# Patient Record
Sex: Female | Born: 1955 | Race: White | Hispanic: No | Smoking: Current every day smoker
Health system: Southern US, Community
[De-identification: ages and names within clinical notes are randomized; demographics above are authoritative.]

## PROBLEM LIST (undated history)

## (undated) DIAGNOSIS — I10 Essential (primary) hypertension: Secondary | ICD-10-CM

## (undated) DIAGNOSIS — M539 Dorsopathy, unspecified: Secondary | ICD-10-CM

## (undated) DIAGNOSIS — F419 Anxiety disorder, unspecified: Secondary | ICD-10-CM

## (undated) DIAGNOSIS — Z8739 Personal history of other diseases of the musculoskeletal system and connective tissue: Secondary | ICD-10-CM

## (undated) DIAGNOSIS — M810 Age-related osteoporosis without current pathological fracture: Secondary | ICD-10-CM

## (undated) DIAGNOSIS — M1990 Unspecified osteoarthritis, unspecified site: Secondary | ICD-10-CM

## (undated) DIAGNOSIS — T8859XA Other complications of anesthesia, initial encounter: Secondary | ICD-10-CM

## (undated) DIAGNOSIS — M5126 Other intervertebral disc displacement, lumbar region: Secondary | ICD-10-CM

## (undated) DIAGNOSIS — E079 Disorder of thyroid, unspecified: Secondary | ICD-10-CM

## (undated) DIAGNOSIS — F32A Depression, unspecified: Secondary | ICD-10-CM

## (undated) DIAGNOSIS — E039 Hypothyroidism, unspecified: Secondary | ICD-10-CM

## (undated) DIAGNOSIS — M51369 Other intervertebral disc degeneration, lumbar region without mention of lumbar back pain or lower extremity pain: Secondary | ICD-10-CM

## (undated) DIAGNOSIS — M5136 Other intervertebral disc degeneration, lumbar region: Secondary | ICD-10-CM

## (undated) DIAGNOSIS — R0600 Dyspnea, unspecified: Secondary | ICD-10-CM

## (undated) DIAGNOSIS — Z923 Personal history of irradiation: Secondary | ICD-10-CM

## (undated) DIAGNOSIS — T4145XA Adverse effect of unspecified anesthetic, initial encounter: Secondary | ICD-10-CM

## (undated) DIAGNOSIS — C539 Malignant neoplasm of cervix uteri, unspecified: Secondary | ICD-10-CM

## (undated) DIAGNOSIS — F329 Major depressive disorder, single episode, unspecified: Secondary | ICD-10-CM

## (undated) HISTORY — DX: Personal history of irradiation: Z92.3

## (undated) HISTORY — PX: BACK SURGERY: SHX140

## (undated) HISTORY — PX: EYE SURGERY: SHX253

## (undated) HISTORY — PX: ABDOMINAL HYSTERECTOMY: SHX81

## (undated) HISTORY — DX: Malignant neoplasm of cervix uteri, unspecified: C53.9

## (undated) HISTORY — PX: TUBAL LIGATION: SHX77

---

## 2001-02-19 ENCOUNTER — Emergency Department (HOSPITAL_COMMUNITY): Admission: EM | Admit: 2001-02-19 | Discharge: 2001-02-19 | Payer: Self-pay | Admitting: Internal Medicine

## 2001-12-08 ENCOUNTER — Emergency Department (HOSPITAL_COMMUNITY): Admission: EM | Admit: 2001-12-08 | Discharge: 2001-12-08 | Payer: Self-pay | Admitting: *Deleted

## 2001-12-10 ENCOUNTER — Emergency Department (HOSPITAL_COMMUNITY): Admission: EM | Admit: 2001-12-10 | Discharge: 2001-12-10 | Payer: Self-pay | Admitting: *Deleted

## 2002-04-16 ENCOUNTER — Ambulatory Visit (HOSPITAL_COMMUNITY): Admission: RE | Admit: 2002-04-16 | Discharge: 2002-04-16 | Payer: Self-pay | Admitting: Family Medicine

## 2002-08-15 ENCOUNTER — Emergency Department (HOSPITAL_COMMUNITY): Admission: EM | Admit: 2002-08-15 | Discharge: 2002-08-16 | Payer: Self-pay | Admitting: Emergency Medicine

## 2003-02-19 ENCOUNTER — Emergency Department (HOSPITAL_COMMUNITY): Admission: EM | Admit: 2003-02-19 | Discharge: 2003-02-20 | Payer: Self-pay | Admitting: *Deleted

## 2003-02-21 ENCOUNTER — Emergency Department (HOSPITAL_COMMUNITY): Admission: EM | Admit: 2003-02-21 | Discharge: 2003-02-21 | Payer: Self-pay | Admitting: *Deleted

## 2003-06-05 ENCOUNTER — Ambulatory Visit (HOSPITAL_COMMUNITY): Admission: RE | Admit: 2003-06-05 | Discharge: 2003-06-05 | Payer: Self-pay | Admitting: Family Medicine

## 2004-12-11 ENCOUNTER — Ambulatory Visit (HOSPITAL_COMMUNITY): Admission: RE | Admit: 2004-12-11 | Discharge: 2004-12-11 | Payer: Self-pay | Admitting: Family Medicine

## 2005-04-27 ENCOUNTER — Ambulatory Visit: Payer: Self-pay | Admitting: Family Medicine

## 2005-04-30 ENCOUNTER — Ambulatory Visit (HOSPITAL_COMMUNITY): Admission: RE | Admit: 2005-04-30 | Discharge: 2005-04-30 | Payer: Self-pay | Admitting: Family Medicine

## 2006-11-06 ENCOUNTER — Emergency Department (HOSPITAL_COMMUNITY): Admission: EM | Admit: 2006-11-06 | Discharge: 2006-11-06 | Payer: Self-pay | Admitting: Emergency Medicine

## 2006-11-10 ENCOUNTER — Ambulatory Visit (HOSPITAL_BASED_OUTPATIENT_CLINIC_OR_DEPARTMENT_OTHER): Admission: RE | Admit: 2006-11-10 | Discharge: 2006-11-10 | Payer: Self-pay | Admitting: Otolaryngology

## 2009-10-03 ENCOUNTER — Ambulatory Visit (HOSPITAL_COMMUNITY): Admission: RE | Admit: 2009-10-03 | Discharge: 2009-10-03 | Payer: Self-pay | Admitting: Family Medicine

## 2010-05-03 ENCOUNTER — Encounter: Payer: Self-pay | Admitting: Family Medicine

## 2010-08-11 ENCOUNTER — Ambulatory Visit (HOSPITAL_COMMUNITY)
Admission: RE | Admit: 2010-08-11 | Discharge: 2010-08-11 | Disposition: A | Discharge status: Home / Self Care | Payer: BC Managed Care – PPO | Source: Ambulatory Visit | Attending: Family Medicine | Admitting: Family Medicine

## 2010-08-11 ENCOUNTER — Other Ambulatory Visit (HOSPITAL_COMMUNITY): Payer: Self-pay | Admitting: Family Medicine

## 2010-08-11 DIAGNOSIS — R0602 Shortness of breath: Secondary | ICD-10-CM | POA: Insufficient documentation

## 2010-08-11 DIAGNOSIS — S298XXA Other specified injuries of thorax, initial encounter: Secondary | ICD-10-CM

## 2010-08-11 DIAGNOSIS — J4489 Other specified chronic obstructive pulmonary disease: Secondary | ICD-10-CM | POA: Insufficient documentation

## 2010-08-11 DIAGNOSIS — R079 Chest pain, unspecified: Secondary | ICD-10-CM | POA: Insufficient documentation

## 2010-08-11 DIAGNOSIS — R05 Cough: Secondary | ICD-10-CM | POA: Insufficient documentation

## 2010-08-11 DIAGNOSIS — J449 Chronic obstructive pulmonary disease, unspecified: Secondary | ICD-10-CM | POA: Insufficient documentation

## 2010-08-11 DIAGNOSIS — R059 Cough, unspecified: Secondary | ICD-10-CM | POA: Insufficient documentation

## 2010-08-13 ENCOUNTER — Other Ambulatory Visit (HOSPITAL_COMMUNITY): Payer: Self-pay | Admitting: Family Medicine

## 2010-08-13 DIAGNOSIS — Z139 Encounter for screening, unspecified: Secondary | ICD-10-CM

## 2010-08-17 ENCOUNTER — Ambulatory Visit (HOSPITAL_COMMUNITY)
Admission: RE | Admit: 2010-08-17 | Discharge: 2010-08-17 | Disposition: A | Discharge status: Home / Self Care | Payer: BC Managed Care – PPO | Source: Ambulatory Visit | Attending: Family Medicine | Admitting: Family Medicine

## 2010-08-17 DIAGNOSIS — Z139 Encounter for screening, unspecified: Secondary | ICD-10-CM

## 2010-08-17 DIAGNOSIS — Z1231 Encounter for screening mammogram for malignant neoplasm of breast: Secondary | ICD-10-CM | POA: Insufficient documentation

## 2010-08-25 NOTE — Op Note (Signed)
[name], [name]              ACCOUNT NO.:  [id number]   MEDICAL RECORD NO.:  [id number]          PATIENT TYPE:  AMB   LOCATION:  DSC                          FACILITY:  [facility]   PHYSICIAN:  Suzanna Obey, M.D.       DATE OF BIRTH:  September 12, 1955   DATE OF PROCEDURE:  11/10/2006  DATE OF DISCHARGE:                               OPERATIVE REPORT   PREOPERATIVE DIAGNOSIS:  Right orbital floor blowout fracture.   POSTOPERATIVE DIAGNOSIS:  Right orbital floor blowout fracture.   SURGICAL PROCEDURE:  Open reduction, internal fixation of orbital floor  fracture.   SURGEON:  Suzanna Obey, M.D.   ANESTHESIA:  LMA.   ESTIMATED BLOOD LOSS:  Less than 5 mL.   INDICATIONS:  This is a 55 year old, who was involved in an injury that  ruptured the floor of her right orbital bone, that resulted in a fairly  significant defect.  She has no diplopia, but she does have a defect  that is potentially possible to cause an enophthalmus.  Given this, she  has no interest in having any risk of this occurring and wants to have  the area repaired.  She was informed of the risks and benefits, as well  as options.  The procedure was discussed.  All her questions were  answered and consent was obtained.   OPERATION:  The patient was taken to the operating room and placed in  supine position.  After adequate general LMA anesthesia, she was placed  in the supine position and prepped and draped in the usual sterile  manner.  The lateral canthus and infraorbital rim were injected with 1%  lidocaine with 1:100,000 epinephrine, approximately 1 mL.  An incision  was then made, after prepping and draping, lateral to the lateral  canthus.  A small incision was made.  Dissection was carried down to the  bone.  The lateral canthus was then divided, and the dissection was  carried down along the infraorbital rim, taking care to keep the  periorbita up and just cutting along the conjunctiva.  The orbital rim  was  exposed, and then an incision was made on the bone and the  periosteum was elevated into the orbital floor.  Immediately, the large  bony fragments and fracture were identified.  The nerve was identified  and preserved.  The fat was brought up out of the maxillary sinus.  The  maxillary sinus was suctioned out of all blood and clot debris.  A solid  portion of the bone was identified laterally and medially.  The  dissection was carried back far enough to reach the 1 edge of the  fracture.  With this up, the inion, a dissolvable floor prosthesis was  placed after performing a template and sizing it correctly.  This  positioned very nicely into the orbital floor and curved with the floor  shape.  This held the orbital contents up nicely.  She had a corneal  protection prosthesis placed over the cornea initially for protection of  the eye.  Once the inion was positioned correctly and hardened, a 4-0  chromic was used to oversewn the periosteum over the rim.  The eye was  then repositioned with the lateral canthus repositioned with a 5-  0 nylon.  It looked to be in perfect position.  The subcutaneous tissue  was then closed with interrupted 4-0 chromic, and an interrupted 5-0  nylon to close the lateral skin incision.  The eye was irrigated with  balanced salt solution.  The patient was then awakened and brought to  the recovery room in stable condition, and counts correct.           ______________________________  Suzanna Obey, M.D.     JB/MEDQ  D:  11/10/2006  T:  11/10/2006  Job:  [id number]

## 2011-01-25 LAB — POCT HEMOGLOBIN-HEMACUE
Hemoglobin: 15.1 — ABNORMAL HIGH
Operator id: 154361

## 2012-03-17 ENCOUNTER — Other Ambulatory Visit (HOSPITAL_COMMUNITY)
Admission: RE | Admit: 2012-03-17 | Discharge: 2012-03-17 | Disposition: A | Discharge status: Home / Self Care | Payer: BC Managed Care – PPO | Source: Ambulatory Visit | Attending: Obstetrics and Gynecology | Admitting: Obstetrics and Gynecology

## 2012-03-17 DIAGNOSIS — Z01419 Encounter for gynecological examination (general) (routine) without abnormal findings: Secondary | ICD-10-CM | POA: Insufficient documentation

## 2012-03-17 DIAGNOSIS — Z1151 Encounter for screening for human papillomavirus (HPV): Secondary | ICD-10-CM | POA: Insufficient documentation

## 2013-10-03 ENCOUNTER — Other Ambulatory Visit (HOSPITAL_COMMUNITY): Payer: Self-pay | Admitting: Family Medicine

## 2013-10-03 ENCOUNTER — Ambulatory Visit (HOSPITAL_COMMUNITY)
Admission: RE | Admit: 2013-10-03 | Discharge: 2013-10-03 | Disposition: A | Discharge status: Home / Self Care | Payer: BC Managed Care – PPO | Source: Ambulatory Visit | Attending: Family Medicine | Admitting: Family Medicine

## 2013-10-03 DIAGNOSIS — R10813 Right lower quadrant abdominal tenderness: Secondary | ICD-10-CM

## 2013-10-03 DIAGNOSIS — N200 Calculus of kidney: Secondary | ICD-10-CM | POA: Insufficient documentation

## 2013-10-03 MED ORDER — IOHEXOL 300 MG/ML  SOLN
100.0000 mL | Freq: Once | INTRAMUSCULAR | Status: AC | PRN
  Administered 2013-10-03: 100 mL via INTRAVENOUS

## 2013-10-04 ENCOUNTER — Other Ambulatory Visit (HOSPITAL_COMMUNITY): Payer: BC Managed Care – PPO

## 2014-04-24 ENCOUNTER — Other Ambulatory Visit (HOSPITAL_COMMUNITY): Payer: Self-pay | Admitting: Family Medicine

## 2014-04-24 ENCOUNTER — Ambulatory Visit (HOSPITAL_COMMUNITY)
Admission: RE | Admit: 2014-04-24 | Discharge: 2014-04-24 | Disposition: A | Discharge status: Home / Self Care | Payer: 59 | Source: Ambulatory Visit | Attending: Family Medicine | Admitting: Family Medicine

## 2014-04-24 DIAGNOSIS — J069 Acute upper respiratory infection, unspecified: Secondary | ICD-10-CM

## 2014-04-24 DIAGNOSIS — R05 Cough: Secondary | ICD-10-CM | POA: Insufficient documentation

## 2014-04-24 DIAGNOSIS — Z87891 Personal history of nicotine dependence: Secondary | ICD-10-CM | POA: Diagnosis not present

## 2014-04-24 DIAGNOSIS — R0789 Other chest pain: Secondary | ICD-10-CM | POA: Insufficient documentation

## 2014-04-24 DIAGNOSIS — R0989 Other specified symptoms and signs involving the circulatory and respiratory systems: Secondary | ICD-10-CM | POA: Diagnosis not present

## 2014-06-03 ENCOUNTER — Other Ambulatory Visit: Payer: Self-pay | Admitting: Preventative Medicine

## 2014-06-03 DIAGNOSIS — M545 Low back pain, unspecified: Secondary | ICD-10-CM

## 2014-06-16 ENCOUNTER — Ambulatory Visit
Admission: RE | Admit: 2014-06-16 | Discharge: 2014-06-16 | Disposition: A | Discharge status: Home / Self Care | Payer: Worker's Compensation | Source: Ambulatory Visit | Attending: Preventative Medicine | Admitting: Preventative Medicine

## 2014-06-16 DIAGNOSIS — M545 Low back pain, unspecified: Secondary | ICD-10-CM

## 2015-12-18 ENCOUNTER — Other Ambulatory Visit (HOSPITAL_COMMUNITY): Payer: Self-pay | Admitting: Internal Medicine

## 2015-12-18 DIAGNOSIS — Z1231 Encounter for screening mammogram for malignant neoplasm of breast: Secondary | ICD-10-CM

## 2015-12-29 ENCOUNTER — Ambulatory Visit (HOSPITAL_COMMUNITY)
Admission: RE | Admit: 2015-12-29 | Discharge: 2015-12-29 | Disposition: A | Discharge status: Home / Self Care | Payer: Medicaid Other | Source: Ambulatory Visit | Attending: Internal Medicine | Admitting: Internal Medicine

## 2015-12-29 DIAGNOSIS — Z1231 Encounter for screening mammogram for malignant neoplasm of breast: Secondary | ICD-10-CM | POA: Diagnosis present

## 2016-01-06 ENCOUNTER — Ambulatory Visit (INDEPENDENT_AMBULATORY_CARE_PROVIDER_SITE_OTHER): Payer: Medicaid Other | Admitting: Psychiatry

## 2016-01-06 ENCOUNTER — Encounter (HOSPITAL_COMMUNITY): Payer: Self-pay | Admitting: Psychiatry

## 2016-01-06 VITALS — BP 159/98 | HR 72 | Ht 64.5 in | Wt 156.0 lb

## 2016-01-06 DIAGNOSIS — F331 Major depressive disorder, recurrent, moderate: Secondary | ICD-10-CM | POA: Diagnosis not present

## 2016-01-06 MED ORDER — QUETIAPINE FUMARATE 25 MG PO TABS: 25.0000 mg | ORAL_TABLET | Freq: Every day | ORAL | 0 refills | Status: DC

## 2016-01-06 NOTE — Progress Notes (Signed)
Psychiatric Initial Adult Assessment   Patient Identification: [name] MRN:  [id number] Date of Evaluation:  01/06/2016 Referral Source: self Chief Complaint:   Chief Complaint    New Evaluation; Depression; Memory Loss; Panic Attack     Visit Diagnosis:    ICD-9-CM ICD-10-CM   1. Moderate episode of recurrent major depressive disorder (HCC) 296.32 F33.1     History of Present Illness:   [name] is a 60 year old female with a history of depression, anxiety, chronic back pain, migraine, thyroid issues on methimazole who is presented for evaluation.   Patient states that she used to [facility] for [facility] diagnosed "bipolar disorder." She talks about the frustration with care there and hopes to transition the care to this clinic. She states that she has been irritable, "agitated" and feels lonely. Only thing she would like to do is go to the [place] to pray for her brother and her other family member. She is in the process of separation from her husband of 30 years a couple months ago. She states at her friend's house. She reports good relationship with her daughter, age 79 and granddaughter, age 52.   She endorses insomnia and sleeps 4 hours with night time awakening. She endorses low energy. She stays in the room most of the time, but occasionally enjoys watching TV. She reports occasional passive suicidal ideation, but denies any intent or plans stating that those are against her religion. She reports AH of hearing something falling off from the cabinet. She denies VH. She had an suicide attempt years ago by cutting her wrists while she was drunk. She reports history of buying thing worth 300 dollars but denies any euphoria or decreased need for sleep. She has been abstinent from alcohol for two months. She used to drink "Orange soda (4-5% alcohol)" two bottles per week. She denies any drug use. She takes Xanax 1-2 mg at night only occasionally.  Associated  Signs/Symptoms: Depression Symptoms:  depressed mood, anhedonia, insomnia, difficulty concentrating, (Hypo) Manic Symptoms:  denies Anxiety Symptoms:  mild anxiety Psychotic Symptoms:  denies PTSD Symptoms: Had a traumatic exposure:  saw his brother died from house fire  Past Psychiatric History:  [facility] for one year,  Outpatient treatment for alcohol use, no previous psychiatry admission  Past trials of medication Paxil, Duloxetine 60 mg BID, Quetiapine 50-100 mg, Xanax 1-2 mg once a day   Previous Psychotropic Medications: Yes   Substance Abuse History in the last 12 months:  Yes.    Consequences of Substance Abuse: Negative  Past Medical History: No past medical history on file. No past surgical history on file.  Family Psychiatric History: mother- "nervous break down,"    Family History: No family history on file.  Social History:   Social History   Social History  . Marital status: Married    Spouse name: N/A  . Number of children: N/A  . Years of education: N/A   Social History Main Topics  . Smoking status: Current Every Day Smoker    Packs/day: 2.00    Types: Cigarettes  . Smokeless tobacco: Never Used  . Alcohol use Yes     Comment: 01-06-2016 Per pt rarely  . Drug use: No     Comment: 01-06-2016 per pt no   . Sexual activity: Not Asked   Other Topics Concern  . None   Social History Narrative  . None    Additional Social History:  Education:  8th grade Unemployed   Allergies:   Allergies  Allergen Reactions  . Prednisone     Sick to stomach  . Zithromax [Azithromycin] Other (See Comments)    Blisters in mouth     Metabolic Disorder Labs: No results found for: HGBA1C, MPG No results found for: PROLACTIN No results found for: CHOL, TRIG, HDL, CHOLHDL, VLDL, LDLCALC   Current Medications: Current Outpatient Prescriptions  Medication Sig Dispense Refill  . ALPRAZolam (XANAX) 1 MG tablet Take 1 mg by mouth 3 (three) times daily  as needed for anxiety.    . celecoxib (CELEBREX) 100 MG capsule Take 100 mg by mouth daily.    . DULoxetine (CYMBALTA) 60 MG capsule Take 60 mg by mouth 2 (two) times daily.    . methimazole (TAPAZOLE) 5 MG tablet Take 5 mg by mouth 2 (two) times daily.    . propranolol ER (INDERAL LA) 60 MG 24 hr capsule Take 60 mg by mouth daily.    . QUEtiapine (SEROQUEL) 100 MG tablet Take 100 mg by mouth. Taking 0.5 Tablet to 1 Tablet QHS    . QUEtiapine (SEROQUEL) 25 MG tablet Take 1 tablet (25 mg total) by mouth daily. 25 mg daily and takes 100 mg at night 30 tablet 0   No current facility-administered medications for this visit.     Neurologic: Headache: No Seizure: No Paresthesias:No  Musculoskeletal: Strength & Muscle Tone: within normal limits Gait & Station: normal Patient leans: N/A  Psychiatric Specialty Exam: Review of Systems  Musculoskeletal: Positive for back pain.  Neurological: Positive for tremors.  Psychiatric/Behavioral: Positive for depression. Negative for hallucinations, substance abuse and suicidal ideas. The patient is nervous/anxious and has insomnia.   All other systems reviewed and are negative.   Blood pressure (!) 159/98, pulse 72, height 5' 4.5" (1.638 m), weight 156 lb (70.8 kg), SpO2 94 %.Body mass index is 26.36 kg/m.  General Appearance: Fairly Groomed  Eye Contact:  Good  Speech:  Clear and Coherent  Volume:  Normal  Mood:  Irritable  Affect: slightly Restricted  Thought Process:  Coherent, circumstantial at times  Orientation:  Full (Time, Place, and Person)  Thought Content:  Logical  Suicidal Thoughts:  No  Homicidal Thoughts:  No  Memory:  Immediate;   Good  Judgement:  Fair  Insight:  Fair  Psychomotor Activity:  Normal  Concentration:  Concentration: Fair and Attention Span: Fair  Recall:  Good  Fund of Knowledge:Good  Language: Good  Akathisia:  No  Handed:  Right  AIMS (if indicated):    Assets:  Communication Skills Desire for  Improvement  ADL's:  Intact  Cognition: WNL  Sleep:  insomnia   Assessment [name] is a 60 year old female with a history of depression, anxiety, chronic back pain, migraine, thyroid issues on methimazole who is presented to the clinic to transition care from [place].   # MDD, recurrent, moderate Patient endorses neurovegetative symptoms in the setting separation from her husband. Will add quetiapine during the day to target her mood symptoms. Will continue duloxetine at this time. Noted that although patient reports irritability and thought of "choking" when she is upset with others, she denies any intent and denies any HI during the interview.   # Alcohol use disorder, early remission Patient is motivated for sobriety. Will continue motivational interview. Noted that patient is prescribed Xanax from her PCP for prn use for tremors. Although advised patient to discontinue this medication given risk of dependence,  patient declines this. Will continue to monitor.   Plans - Continue duloxetine 60 mg twice a day - Start quetiapine 25 mg daily, continue 100 mg qhs - Continue abstinence from alcohol - Return to clinic in 1 month  Treatment Plan Summary: Medication management  The patient demonstrates the following  risk factors for suicide: Chronic risk factors for suicide include psychiatric disorder /depression, substance use disorder, medical illness/chronic back pain, previous suicide attempt of cutting her wrist,demographic factors ( >60yo). Acute risk factors for suicide include family or marital conflict, unemployment.  Protective factors for this patient include positive social support, responsibility to others (children, family), coping skills, hope for the future, religious beliefs against suicide.  Considering these factors, the overall suicide risk at this point appears to be low.  Norman Clay, MD 9/26/20174:34 PM

## 2016-01-06 NOTE — Patient Instructions (Addendum)
-   Start quetiapine 25 mg daily, continue 100 mg at night - Continue duloxetine 60 mg twice a day - Continue abstinence from alcohol - Return to clinic in 1 month

## 2016-02-04 NOTE — Progress Notes (Addendum)
BH MD/PA/NP OP Progress Note  02/05/2016 10:34 AM [name]  MRN:  [id number]  Chief Complaint:  Chief Complaint    Follow-up; Depression; Anxiety     Subjective:  "I feel tired." HPI:  Patient endorses anhedonia. She states at her friend's house and does not go outside, feeling tired. She wonders if duloxetine making her tired. 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 She does not even go outside these days due to her back pain. She finds Seroquel to be helpful as she does not get stressed as used to during the day. She takes Xanax once or 3 times per week.  She reports good sleep with quetiapine.She denies AH/VH, but feels "rats" are on her legs. She denies SI, HI. She denies alcohol use.   Visit Diagnosis:    ICD-9-CM ICD-10-CM   1. Moderate episode of recurrent major depressive disorder (HCC) 296.32 F33.1     Past Psychiatric History:  Outpatient: [facility] for one year, alcohol use Psychiatry admission: denies Previous suicide attempt:  Cutting her wrists while she was drunk, years ago Past trials of medication: Paxil, Duloxetine 60 mg BID, Quetiapine 50-100 mg, Xanax 1-2 mg once a day   Past Medical History: No past medical history on file. No past surgical history on file.  Family Psychiatric History:  mother- "nervous break down,"   Family History: No family history on file.  Social History:  Social History   Social History  . Marital status: Married    Spouse name: N/A  . Number of children: N/A  . Years of education: N/A   Social History Main Topics  . Smoking status: Current Every Day Smoker    Packs/day: 2.00    Types: Cigarettes  . Smokeless tobacco: Never Used  . Alcohol use Yes     Comment: 01-06-2016 Per pt rarely, 02-05-2016 per pt no but 84yr ago    . Drug use: No     Comment: 02-05-2016 per pt no but about 40 yrs ago  . Sexual activity: Not Asked   Other Topics Concern  .  None   Social History Narrative  . None    Allergies:  Allergies  Allergen Reactions  . Prednisone     Sick to stomach  . Zithromax [Azithromycin] Other (See Comments)    Blisters in mouth     Metabolic Disorder Labs: No results found for: HGBA1C, MPG No results found for: PROLACTIN No results found for: CHOL, TRIG, HDL, CHOLHDL, VLDL, LDLCALC   Current Medications: Current Outpatient Prescriptions  Medication Sig Dispense Refill  . ALPRAZolam (XANAX) 1 MG tablet Take 1 mg by mouth 3 (three) times daily as needed for anxiety.    . celecoxib (CELEBREX) 100 MG capsule Take 100 mg by mouth daily.    . DULoxetine (CYMBALTA) 60 MG capsule Take 1 capsule (60 mg total) by mouth 2 (two) times daily. 30 capsule 0  . methimazole (TAPAZOLE) 5 MG tablet Take 5 mg by mouth 2 (two) times daily.    . propranolol ER (INDERAL LA) 60 MG 24 hr capsule Take 60 mg by mouth daily.    . QUEtiapine (SEROQUEL) 100 MG tablet Take 1 tablet (100 mg total) by mouth at bedtime. 30 tablet 0  . QUEtiapine (SEROQUEL) 25 MG tablet Take 1 tablet (25 mg total) by mouth daily. 25 mg daily and takes 100 mg at night 30 tablet 0  . simvastatin (ZOCOR) 20  MG tablet Take 20 mg by mouth daily.    . traMADol (ULTRAM) 50 MG tablet Take 50 mg by mouth as needed.    Marland Kitchen buPROPion (WELLBUTRIN XL) 150 MG 24 hr tablet Take 1 tablet (150 mg total) by mouth every morning. 30 tablet 0   No current facility-administered medications for this visit.    Labs 01/13/2016 Cre 0.92, Glu 97, HbA1c 5.6%, TSH 1.76 AST 17, ALT 12, ALP 121, Chol 293, TG 262, HDL 42, VLDL 52, LDL 199,   Neurologic: Headache: Yes Seizure: No Paresthesias: No  Musculoskeletal: Strength & Muscle Tone: within normal limits Gait & Station: normal Patient leans: N/A  Psychiatric Specialty Exam: Review of Systems  Musculoskeletal: Positive for back pain.  Neurological: Positive for tingling. Negative for dizziness.  Psychiatric/Behavioral: Positive for  depression and hallucinations. Negative for substance abuse and suicidal ideas. The patient is nervous/anxious. The patient does not have insomnia.   All other systems reviewed and are negative.   Blood pressure (!) 161/114, pulse 77, height 5' 4.5" (1.638 m), weight 154 lb 12.8 oz (70.2 kg).Body mass index is 26.16 kg/m.  General Appearance: Well Groomed  Eye Contact:  Good  Speech:  Clear and Coherent  Volume:  Normal  Mood:  "numb"  Affect:  Restricted  Thought Process:  Coherent and Goal Directed Perceptions: denies AH/VH, but feels "rats" are on her legs  Orientation:  Full (Time, Place, and Person)  Thought Content: Logical   Suicidal Thoughts:  No  Homicidal Thoughts:  No  Memory:  Immediate;   Good Recent;   Good Remote;   Good  Judgement:  Fair  Insight:  Fair  Psychomotor Activity:  Normal  Concentration:  Concentration: Good and Attention Span: Good  Recall:  Good  Fund of Knowledge: Good  Language: Good  Akathisia:  NA  Handed:  Right  AIMS (if indicated):  N/A  Assets:  Communication Skills Desire for Improvement  ADL's:  Intact  Cognition: WNL  Sleep:  good   Assessment [name] is a 60 year old female with a history of depression, anxiety, hyperlipidemia, hypertension, chronic back pain, migraine, thyroid issues on methimazole who is presented to the clinic to transition care from [place].   # MDD, recurrent, moderate Patient continues to endorse neurovegetative symptoms in the setting separation from her husband, although she did have some response to quetiapine for her irritability. Will add Wellbutrin given her significant anhedonia/fatigue. Behavioral activation is discussed. Discussed risks of quetiapine which includes metabolic side effect. Noted that her BP is high as she did not take medication this morning to be prepared for her blood work with her provider per report. Will continue to monitor.   # Alcohol use disorder, early  remission Patient is motivated for sobriety. Will continue motivational interview. Noted that patient is prescribed Xanax from her PCP for prn use for tremors. Although advised patient to discontinue this medication given risk of dependence,  patient declines this. Patient is informed that this medication will not be prescribed by this writer.   Plans - Continue duloxetine 60 mg twice a day - Continue quetiapine 25 mg daily, continue 100 mg qhs -  Start Wellbutrin 150 mg daily - Return to clinic in 1 month  Treatment Plan Summary: Medication management  The patient demonstrates the following  risk factors for suicide: Chronic risk factors for suicide include psychiatric disorder /depression, substance use disorder, medical illness/chronic back pain, previous suicide attempt of cutting her wrist,demographic factors ( >  52 yo). Acute risk factors for suicide include family or marital conflict, unemployment.  Protective factors for this patient include positive social support, responsibility to others (children, family), coping skills, hope for the future, religious beliefs against suicide.  Considering these factors, the overall suicide risk at this point appears to be low.  Norman Clay, MD 02/05/2016, 10:34 AM

## 2016-02-05 ENCOUNTER — Encounter (HOSPITAL_COMMUNITY): Payer: Self-pay | Admitting: Psychiatry

## 2016-02-05 ENCOUNTER — Ambulatory Visit (INDEPENDENT_AMBULATORY_CARE_PROVIDER_SITE_OTHER): Payer: Medicaid Other | Admitting: Psychiatry

## 2016-02-05 ENCOUNTER — Encounter (INDEPENDENT_AMBULATORY_CARE_PROVIDER_SITE_OTHER): Payer: Self-pay

## 2016-02-05 VITALS — BP 161/114 | HR 77 | Ht 64.5 in | Wt 154.8 lb

## 2016-02-05 DIAGNOSIS — F331 Major depressive disorder, recurrent, moderate: Secondary | ICD-10-CM | POA: Diagnosis not present

## 2016-02-05 DIAGNOSIS — F17210 Nicotine dependence, cigarettes, uncomplicated: Secondary | ICD-10-CM | POA: Diagnosis not present

## 2016-02-05 DIAGNOSIS — Z79899 Other long term (current) drug therapy: Secondary | ICD-10-CM | POA: Diagnosis not present

## 2016-02-05 MED ORDER — QUETIAPINE FUMARATE 100 MG PO TABS: 100.0000 mg | ORAL_TABLET | Freq: Every day | ORAL | 0 refills | Status: DC

## 2016-02-05 MED ORDER — QUETIAPINE FUMARATE 25 MG PO TABS: 25.0000 mg | ORAL_TABLET | Freq: Every day | ORAL | 0 refills | Status: DC

## 2016-02-05 MED ORDER — DULOXETINE HCL 60 MG PO CPEP: 60.0000 mg | ORAL_CAPSULE | Freq: Two times a day (BID) | ORAL | 0 refills | Status: DC

## 2016-02-05 MED ORDER — BUPROPION HCL ER (XL) 150 MG PO TB24: 150.0000 mg | ORAL_TABLET | ORAL | 0 refills | Status: DC

## 2016-02-05 NOTE — Patient Instructions (Signed)
1. Continue duloxetine 60 mg twice a day 2. Start Wellbutrin 150 mg daily 3. Continue quetiapine 25 mg in the morning, 100 mg at night 4. Return to clinic in one month 5. Try to do some exercise during the day (i.e. Walking for 20 minutes)

## 2016-03-01 NOTE — Progress Notes (Signed)
[place] MD/PA/NP OP Progress Note  03/02/2016 2:58 PM [name]  MRN:  [id number]  Chief Complaint:  Chief Complaint    Follow-up; Depression     Subjective:  "I feel tired." HPI:  Patient states that she felt confused after starting Wellbutrin.  She was noted to have some tremors and discontinue this medication.  She endorses fatigue and tends to isolate herself. Although she does not have a feeling of "rat crawling" any more, she now feels that she has some discomfort on her left extremity. She occasionally feels anxious and continue to take Xanax given from her friends; 1 mg daily, a couple of times per week. She reports night time awakening with anxiety, although it appears to get better after starting quetiapine. She reports passive SI but adamantly denies any plans/intent, stating that she has a daughter and grandchild, age 13 yo. She denies alcohol use or drug use. She is concerned about her memory loss and reports and episode of not knowing where she is driving. She is wondering whether she should get neurological testing  Visit Diagnosis:    ICD-9-CM ICD-10-CM   1. Moderate episode of recurrent major depressive disorder (HCC) 296.32 F33.1     Past Psychiatric History:  Out[facility], alcohol use Psychiatry admission: denies Previous suicide attempt:  Cutting her wrists while she was drunk, years ago Past trials of medication: Paxil, Duloxetine 60 mg BID, Quetiapine 50-100 mg, Xanax 1-2 mg once a day   Past Medical History: No past medical history on file. No past surgical history on file.  Family Psychiatric History:  mother- "nervous break down,"   Family History: No family history on file.  Social History:  Social History   Social History  . Marital status: Married    Spouse name: N/A  . Number of children: N/A  . Years of education: N/A   Social History Main Topics  . Smoking status: Current Every Day Smoker    Packs/day: 2.00    Types:  Cigarettes  . Smokeless tobacco: Never Used  . Alcohol use Yes     Comment: 01-06-2016 Per pt rarely, 02-05-2016 per pt no but 81yr ago    . Drug use: No     Comment: 02-05-2016 per pt no but about 40 yrs ago  . Sexual activity: Not on file   Other Topics Concern  . Not on file   Social History Narrative  . No narrative on file    Allergies:  Allergies  Allergen Reactions  . Prednisone     Sick to stomach  . Zithromax [Azithromycin] Other (See Comments)    Blisters in mouth     Metabolic Disorder Labs: No results found for: HGBA1C, MPG No results found for: PROLACTIN No results found for: CHOL, TRIG, HDL, CHOLHDL, VLDL, LDLCALC   Current Medications: Current Outpatient Prescriptions  Medication Sig Dispense Refill  . celecoxib (CELEBREX) 100 MG capsule Take 100 mg by mouth daily.    . DULoxetine (CYMBALTA) 60 MG capsule Take 1 capsule (60 mg total) by mouth 2 (two) times daily. 30 capsule 1  . methimazole (TAPAZOLE) 5 MG tablet Take 5 mg by mouth 2 (two) times daily.    . propranolol ER (INDERAL LA) 60 MG 24 hr capsule Take 60 mg by mouth daily.    . QUEtiapine (SEROQUEL) 100 MG tablet Take 1 tablet (100 mg total) by mouth at bedtime. 30 tablet 1  . QUEtiapine (SEROQUEL) 25 MG tablet Take 1 tablet (  25 mg total) by mouth daily. 25 mg daily and takes 100 mg at night 30 tablet 1  . simvastatin (ZOCOR) 20 MG tablet Take 20 mg by mouth daily.    . traMADol (ULTRAM) 50 MG tablet Take 50 mg by mouth as needed.     No current facility-administered medications for this visit.    Labs 01/13/2016 Cre 0.92, Glu 97, HbA1c 5.6%, TSH 1.76 AST 17, ALT 12, ALP 121, Chol 293, TG 262, HDL 42, VLDL 52, LDL 199,   Neurologic: Headache: Yes Seizure: No Paresthesias: No  Musculoskeletal: Strength & Muscle Tone: within normal limits Gait & Station: normal Patient leans: N/A  Psychiatric Specialty Exam: Review of Systems  Eyes: Positive for blurred vision.  Musculoskeletal:  Positive for back pain.  Neurological: Positive for tingling. Negative for dizziness.  Psychiatric/Behavioral: Positive for depression and suicidal ideas. Negative for substance abuse. The patient is nervous/anxious. The patient does not have insomnia.   All other systems reviewed and are negative.   Blood pressure (!) 171/115, pulse 77, resp. rate 18, weight 155 lb 12.8 oz (70.7 kg).Body mass index is 26.33 kg/m.  General Appearance: Well Groomed  Eye Contact:  Good  Speech:  Clear and Coherent  Volume:  Normal  Mood:  "tired"  Affect:  Restricted  Thought Process:  Coherent and Goal Directed Perceptions: denies AH/VH  Orientation:  Full (Time, Place, and Person)  Thought Content: Logical   Suicidal Thoughts:  Yes.  without intent/plan  Homicidal Thoughts:  No  Memory:  Immediate;   Good Recent;   Good Remote;   Good  Judgement:  Fair  Insight:  Fair  Psychomotor Activity:  Normal  Concentration:  Concentration: Good and Attention Span: Good  Recall:  Good  Fund of Knowledge: Good  Language: Good  Akathisia:  NA  Handed:  Right  AIMS (if indicated):  N/A  Assets:  Communication Skills Desire for Improvement  ADL's:  Intact  Cognition: WNL  Sleep:  good   Assessment [name] is a 60 year old female with a history of depression, anxiety, hyperlipidemia, hypertension, chronic back pain, migraine, thyroid issues on methimazole who is presented to the clinic to transition care from [facility].   # MDD, recurrent, moderate Patient continues to endorse neurovegetative symptoms in the setting separation from her husband, although she did have some response to quetiapine for her irritability. Wellbutrin was discontinued due to adverse effects. Will continue the current regimen at this time with hope that she would respond very well to CBT/supportive therapy. Dicussed risk of quetiapine which includes metabolic side effect. Patient to obtain blood work from her PCP.  Discussed behavioral activation.   # r/o unspecified neurocognitive disorder Patient endorses chronic memory loss. It is difficult to discern in the setting of depression. Will consider evaluating with MOCA at the next visit.   # Alcohol use disorder, early remission Patient is motivated for sobriety. Will continue motivational interview. Noted that patient is taking Xanax from her friends for anxiety/tremors. Dicussed again about its risk of dependence and somnolence. Patient agrees to discontinue this medication.   # Hypertension Patient endorses blurry vision and has uncontrolled hypertension. She is advised to contact with her PCP for further evaluation/treatment.   Plans 1. Continue duloxetine 60 mg twice a day 2. Continue quetiapine 25 mg daily, 100 mg at night 3. Discontinue Wellbutrin, Xanax 4. Patient will contact for therapy: Dr. Alford Highland Schneidmiller  [phone] [address], [place], [place]  [address] 5. Return to clinic in January  Treatment Plan Summary: As above  The patient demonstrates the following  risk factors for suicide: Chronic risk factors for suicide include psychiatric disorder /depression, substance use disorder, medical illness/chronic back pain, previous suicide attempt of cutting her wrist,demographic factors ( >71 yo). Acute risk factors for suicide include family or marital conflict, unemployment.  Protective factors for this patient include positive social support, responsibility to others (children, family), coping skills, hope for the future, religious beliefs against suicide.  Considering these factors, the overall suicide risk at this point appears to be low.  Norman Clay, MD 03/02/2016, 2:58 PM

## 2016-03-02 ENCOUNTER — Ambulatory Visit (INDEPENDENT_AMBULATORY_CARE_PROVIDER_SITE_OTHER): Payer: Medicaid Other | Admitting: Psychiatry

## 2016-03-02 VITALS — BP 171/115 | HR 77 | Resp 18 | Wt 155.8 lb

## 2016-03-02 DIAGNOSIS — F331 Major depressive disorder, recurrent, moderate: Secondary | ICD-10-CM

## 2016-03-02 DIAGNOSIS — Z79899 Other long term (current) drug therapy: Secondary | ICD-10-CM

## 2016-03-02 DIAGNOSIS — F17210 Nicotine dependence, cigarettes, uncomplicated: Secondary | ICD-10-CM | POA: Diagnosis not present

## 2016-03-02 DIAGNOSIS — R45851 Suicidal ideations: Secondary | ICD-10-CM

## 2016-03-02 MED ORDER — QUETIAPINE FUMARATE 100 MG PO TABS: 100.0000 mg | ORAL_TABLET | Freq: Every day | ORAL | 1 refills | Status: DC

## 2016-03-02 MED ORDER — QUETIAPINE FUMARATE 25 MG PO TABS: 25.0000 mg | ORAL_TABLET | Freq: Every day | ORAL | 1 refills | Status: DC

## 2016-03-02 MED ORDER — DULOXETINE HCL 60 MG PO CPEP: 60.0000 mg | ORAL_CAPSULE | Freq: Two times a day (BID) | ORAL | 1 refills | Status: DC

## 2016-03-02 NOTE — Patient Instructions (Addendum)
1. Continue duloxetine 60 mg twice a day 2. Continue quetiapine 25 mg daily, 100 mg at night 3. Discontinue Wellbutrin 4. Contact for therapy: Dr. Alford Highland Schneidmiller  [phone] [address] [place], [place] [address] 5. Return to clinic in January

## 2016-03-03 ENCOUNTER — Telehealth (HOSPITAL_COMMUNITY): Payer: Self-pay | Admitting: *Deleted

## 2016-03-03 NOTE — Telephone Encounter (Signed)
Medication management - Telephone call with patient after speaking with Estill Bamberg at [facility] to inform they confirmed they have both Seroquel prescriptions that were e-scribed to them 03/02/16 but were just behind in filling them.  Patient to call back if any further problems getting refills.

## 2016-03-03 NOTE — Telephone Encounter (Signed)
phone call from patient, she said the QUEtiapine (SEROQUEL) 100 MG tablet for night time, was not called in.

## 2016-03-09 ENCOUNTER — Telehealth (HOSPITAL_COMMUNITY): Payer: Self-pay | Admitting: *Deleted

## 2016-03-09 NOTE — Telephone Encounter (Signed)
Pt pharmacy called stating pt insurance (medicaid) is needing a safety documentation for pt her Seroquel. Per pt pharmacy stated due to them already doing 1 overde for this medication and the system is not allowing them to do another. Per pharmacy pt is out of medication.

## 2016-03-11 ENCOUNTER — Telehealth (HOSPITAL_COMMUNITY): Payer: Self-pay | Admitting: *Deleted

## 2016-03-11 NOTE — Telephone Encounter (Signed)
noted 

## 2016-03-11 NOTE — Telephone Encounter (Signed)
Prior authorization for Seroquel received. Called [place] [facility] spoke with Margreta Journey who gave approval #[id number] until 03/06/17. Called to notify pharmacy.

## 2016-03-15 NOTE — Telephone Encounter (Signed)
I have no idea what this means. Is there a form to fill 0ut?

## 2016-03-17 ENCOUNTER — Other Ambulatory Visit (HOSPITAL_COMMUNITY): Payer: Self-pay | Admitting: Internal Medicine

## 2016-03-17 DIAGNOSIS — Z780 Asymptomatic menopausal state: Secondary | ICD-10-CM

## 2016-03-17 NOTE — Telephone Encounter (Signed)
[name] did you get any further details as to is needed from the office and which form is needing to be completed.

## 2016-03-18 ENCOUNTER — Telehealth (HOSPITAL_COMMUNITY): Payer: Self-pay | Admitting: *Deleted

## 2016-03-18 NOTE — Telephone Encounter (Signed)
Called for prior authorization of Seroquel. Spoke with Janett Billow who gave approval [id number] good until 03/13/17. Called to notify pharmacy.

## 2016-03-19 NOTE — Telephone Encounter (Signed)
noted 

## 2016-03-22 ENCOUNTER — Other Ambulatory Visit (HOSPITAL_COMMUNITY): Payer: Self-pay

## 2016-03-29 ENCOUNTER — Other Ambulatory Visit (HOSPITAL_COMMUNITY): Payer: Self-pay

## 2016-04-21 ENCOUNTER — Ambulatory Visit (HOSPITAL_COMMUNITY)
Admission: RE | Admit: 2016-04-21 | Discharge: 2016-04-21 | Disposition: A | Discharge status: Home / Self Care | Payer: Medicaid Other | Source: Ambulatory Visit | Attending: Internal Medicine | Admitting: Internal Medicine

## 2016-04-21 DIAGNOSIS — M8588 Other specified disorders of bone density and structure, other site: Secondary | ICD-10-CM | POA: Diagnosis not present

## 2016-04-21 DIAGNOSIS — Z780 Asymptomatic menopausal state: Secondary | ICD-10-CM | POA: Insufficient documentation

## 2016-04-21 DIAGNOSIS — M810 Age-related osteoporosis without current pathological fracture: Secondary | ICD-10-CM | POA: Insufficient documentation

## 2016-05-04 NOTE — Progress Notes (Deleted)
[facility] MD/PA/NP OP Progress Note  05/04/2016 1:08 PM [name]  MRN:  [id number]  Chief Complaint:   Subjective:  "I feel tired." HPI:  Patient states that she felt confused after starting Wellbutrin.  She was noted to have some tremors and discontinue this medication.  She endorses fatigue and tends to isolate herself. Although she does not have a feeling of "rat crawling" any more, she now feels that she has some discomfort on her left extremity. She occasionally feels anxious and continue to take Xanax given from her friends; 1 mg daily, a couple of times per week. She reports night time awakening with anxiety, although it appears to get better after starting quetiapine. She reports passive SI but adamantly denies any plans/intent, stating that she has a daughter and grandchild, age 66 yo. She denies alcohol use or drug use. She is concerned about her memory loss and reports and episode of not knowing where she is driving. She is wondering whether she should get neurological testing  Visit Diagnosis:  No diagnosis found.  Past Psychiatric History:  [facility], alcohol use Psychiatry admission: denies Previous suicide attempt:  Cutting her wrists while she was drunk, years ago Past trials of medication: Paxil, Duloxetine 60 mg BID, Quetiapine 50-100 mg, Xanax 1-2 mg once a day   Past Medical History: No past medical history on file. No past surgical history on file.  Family Psychiatric History:  mother- "nervous break down,"   Family History: No family history on file.  Social History:  Social History   Social History  . Marital status: Married    Spouse name: N/A  . Number of children: N/A  . Years of education: N/A   Social History Main Topics  . Smoking status: Current Every Day Smoker    Packs/day: 2.00    Types: Cigarettes  . Smokeless tobacco: Never Used  . Alcohol use Yes     Comment: 01-06-2016 Per pt rarely, 02-05-2016 per pt no but  90yr ago    . Drug use: No     Comment: 02-05-2016 per pt no but about 40 yrs ago  . Sexual activity: Not on file   Other Topics Concern  . Not on file   Social History Narrative  . No narrative on file    Allergies:  Allergies  Allergen Reactions  . Prednisone     Sick to stomach  . Zithromax [Azithromycin] Other (See Comments)    Blisters in mouth     Metabolic Disorder Labs: No results found for: HGBA1C, MPG No results found for: PROLACTIN No results found for: CHOL, TRIG, HDL, CHOLHDL, VLDL, LDLCALC   Current Medications: Current Outpatient Prescriptions  Medication Sig Dispense Refill  . celecoxib (CELEBREX) 100 MG capsule Take 100 mg by mouth daily.    . DULoxetine (CYMBALTA) 60 MG capsule Take 1 capsule (60 mg total) by mouth 2 (two) times daily. 30 capsule 1  . methimazole (TAPAZOLE) 5 MG tablet Take 5 mg by mouth 2 (two) times daily.    . propranolol ER (INDERAL LA) 60 MG 24 hr capsule Take 60 mg by mouth daily.    . QUEtiapine (SEROQUEL) 100 MG tablet Take 1 tablet (100 mg total) by mouth at bedtime. 30 tablet 1  . QUEtiapine (SEROQUEL) 25 MG tablet Take 1 tablet (25 mg total) by mouth daily. 25 mg daily and takes 100 mg at night 30 tablet 1  . simvastatin (ZOCOR) 20 MG tablet Take 20  mg by mouth daily.    . traMADol (ULTRAM) 50 MG tablet Take 50 mg by mouth as needed.     No current facility-administered medications for this visit.    Labs 01/13/2016 Cre 0.92, Glu 97, HbA1c 5.6%, TSH 1.76 AST 17, ALT 12, ALP 121, Chol 293, TG 262, HDL 42, VLDL 52, LDL 199,   Neurologic: Headache: Yes Seizure: No Paresthesias: No  Musculoskeletal: Strength & Muscle Tone: within normal limits Gait & Station: normal Patient leans: N/A  Psychiatric Specialty Exam: Review of Systems  Eyes: Positive for blurred vision.  Musculoskeletal: Positive for back pain.  Neurological: Positive for tingling. Negative for dizziness.  Psychiatric/Behavioral: Positive for depression  and suicidal ideas. Negative for substance abuse. The patient is nervous/anxious. The patient does not have insomnia.   All other systems reviewed and are negative.   There were no vitals taken for this visit.There is no height or weight on file to calculate BMI.  General Appearance: Well Groomed  Eye Contact:  Good  Speech:  Clear and Coherent  Volume:  Normal  Mood:  "tired"  Affect:  Restricted  Thought Process:  Coherent and Goal Directed Perceptions: denies AH/VH  Orientation:  Full (Time, Place, and Person)  Thought Content: Logical   Suicidal Thoughts:  Yes.  without intent/plan  Homicidal Thoughts:  No  Memory:  Immediate;   Good Recent;   Good Remote;   Good  Judgement:  Fair  Insight:  Fair  Psychomotor Activity:  Normal  Concentration:  Concentration: Good and Attention Span: Good  Recall:  Good  Fund of Knowledge: Good  Language: Good  Akathisia:  NA  Handed:  Right  AIMS (if indicated):  N/A  Assets:  Communication Skills Desire for Improvement  ADL's:  Intact  Cognition: WNL  Sleep:  good   Assessment [name] is a 61 year old female with a history of depression, anxiety, hyperlipidemia, hypertension, chronic back pain, migraine, thyroid issues on methimazole who is presented to the clinic to transition care from [facility].   # MDD, recurrent, moderate Patient continues to endorse neurovegetative symptoms in the setting separation from her husband, although she did have some response to quetiapine for her irritability. Wellbutrin was discontinued due to adverse effects. Will continue the current regimen at this time with hope that she would respond very well to CBT/supportive therapy. Dicussed risk of quetiapine which includes metabolic side effect. Patient to obtain blood work from her PCP. Discussed behavioral activation.   # r/o unspecified neurocognitive disorder Patient endorses chronic memory loss. It is difficult to discern in the setting of  depression. Will consider evaluating with MOCA at the next visit.   # Alcohol use disorder, early remission Patient is motivated for sobriety. Will continue motivational interview. Noted that patient is taking Xanax from her friends for anxiety/tremors. Dicussed again about its risk of dependence and somnolence. Patient agrees to discontinue this medication.   # Hypertension Patient endorses blurry vision and has uncontrolled hypertension. She is advised to contact with her PCP for further evaluation/treatment.   Plans 1. Continue duloxetine 60 mg twice a day 2. Continue quetiapine 25 mg daily, 100 mg at night 3. Discontinue Wellbutrin, Xanax 4. Patient will contact for therapy: Dr. Alford Highland Schneidmiller  [phone] [address] [place], [place] [address] 5. Return to clinic in January  Treatment Plan Summary: As above  The patient demonstrates the following  risk factors for suicide: Chronic risk factors for suicide include  psychiatric disorder /depression, substance use disorder, medical illness/chronic back pain, previous suicide attempt of cutting her wrist,demographic factors ( >17 yo). Acute risk factors for suicide include family or marital conflict, unemployment.  Protective factors for this patient include positive social support, responsibility to others (children, family), coping skills, hope for the future, religious beliefs against suicide.  Considering these factors, the overall suicide risk at this point appears to be low.  Norman Clay, MD 05/04/2016, 1:08 PM

## 2016-05-06 ENCOUNTER — Encounter (HOSPITAL_COMMUNITY): Payer: Self-pay

## 2016-05-06 ENCOUNTER — Ambulatory Visit (HOSPITAL_COMMUNITY): Payer: Medicaid Other | Admitting: Psychiatry

## 2016-05-13 ENCOUNTER — Encounter (HOSPITAL_COMMUNITY): Payer: Self-pay

## 2016-05-13 ENCOUNTER — Emergency Department (HOSPITAL_COMMUNITY)
Admission: EM | Admit: 2016-05-13 | Discharge: 2016-05-13 | Disposition: A | Discharge status: Home / Self Care | Payer: Medicaid Other | Attending: Emergency Medicine | Admitting: Emergency Medicine

## 2016-05-13 DIAGNOSIS — G8929 Other chronic pain: Secondary | ICD-10-CM | POA: Insufficient documentation

## 2016-05-13 DIAGNOSIS — Z79899 Other long term (current) drug therapy: Secondary | ICD-10-CM | POA: Insufficient documentation

## 2016-05-13 DIAGNOSIS — F17210 Nicotine dependence, cigarettes, uncomplicated: Secondary | ICD-10-CM | POA: Diagnosis not present

## 2016-05-13 DIAGNOSIS — I10 Essential (primary) hypertension: Secondary | ICD-10-CM | POA: Insufficient documentation

## 2016-05-13 DIAGNOSIS — M5442 Lumbago with sciatica, left side: Secondary | ICD-10-CM | POA: Insufficient documentation

## 2016-05-13 DIAGNOSIS — M545 Low back pain: Secondary | ICD-10-CM | POA: Diagnosis present

## 2016-05-13 HISTORY — DX: Dorsopathy, unspecified: M53.9

## 2016-05-13 HISTORY — DX: Other intervertebral disc displacement, lumbar region: M51.26

## 2016-05-13 HISTORY — DX: Disorder of thyroid, unspecified: E07.9

## 2016-05-13 HISTORY — DX: Essential (primary) hypertension: I10

## 2016-05-13 HISTORY — DX: Other intervertebral disc degeneration, lumbar region without mention of lumbar back pain or lower extremity pain: M51.369

## 2016-05-13 HISTORY — DX: Depression, unspecified: F32.A

## 2016-05-13 HISTORY — DX: Other intervertebral disc degeneration, lumbar region: M51.36

## 2016-05-13 HISTORY — DX: Major depressive disorder, single episode, unspecified: F32.9

## 2016-05-13 HISTORY — DX: Personal history of other diseases of the musculoskeletal system and connective tissue: Z87.39

## 2016-05-13 MED ORDER — HYDROCODONE-ACETAMINOPHEN 5-325 MG PO TABS: 1.0000 | ORAL_TABLET | ORAL | 0 refills | Status: DC | PRN

## 2016-05-13 MED ORDER — DEXAMETHASONE SODIUM PHOSPHATE 10 MG/ML IJ SOLN
10.0000 mg | Freq: Once | INTRAMUSCULAR | Status: AC
  Administered 2016-05-13: 10 mg via INTRAMUSCULAR
  Filled 2016-05-13: qty 1

## 2016-05-13 NOTE — ED Triage Notes (Signed)
I go to the [facility] and they changed my medications.  I have scoliosis and bulging discs and bone spurs.  They gave me tramadol and that does not work.  Having pain pain in my back and in my left leg today.

## 2016-05-13 NOTE — ED Triage Notes (Signed)
We talked about going to another clinic that gives shots in the back.  Patient denies any loss of bowel or bladder.

## 2016-05-13 NOTE — ED Notes (Signed)
Pt alert & oriented x4, stable gait. Patient given discharge instructions, paperwork & prescription(s). Patient informed not to drive, operate any equipment & handel any important documents 4 hours after taking pain medication. Patient  instructed to stop at the registration desk to finish any additional paperwork. Patient  verbalized understanding. Pt left department w/ no further questions. 

## 2016-05-13 NOTE — Discharge Instructions (Signed)
Do not drive within 4 hours of taking hydrocodone as this will make you drowsy.  Avoid lifting,  Bending,  Twisting or any other activity that worsens your pain over the next week.  Continue using heat to your back but only in 20 minute increments so you do not burn your skin.  You should get rechecked if your symptoms are not better over the next 5 days,  Or you develop increased pain,  Weakness in your leg(s) or loss of bladder or bowel function - these can be symptoms of a worsening condition.

## 2016-05-13 NOTE — ED Notes (Signed)
Pt states back pain for over a year. Increase in pain, was unable to see PCP today. Lower back pain that goes down her left leg. Pt says cant stand another night of pain. Pt denies any recent injury or activity.

## 2016-05-13 NOTE — ED Provider Notes (Signed)
[facility] DEPT Provider Note   CSN: [id number] Arrival date & time: 05/13/16  1912     History   Chief Complaint Chief Complaint  Patient presents with  . Back Pain    HPI [name] is a 61 y.o. female presenting with acute on chronic low back pain secondary to documented djd, ddd  And long standing scoliosis with worsened pain over the past week.  She is currently taking tramadol, prescribed by her pcp which does not adequately relieve her pain, stating she has not been able to sleep in 2 nights due to discomfort. She was unable to be seen in their office today.  Patient denies any new injury specifically.  There is radiation of pain into her left leg to her heel.  There has been no weakness or numbness in the lower extremities and no urinary or bowel retention or incontinence.  Patient does not have a history of cancer or IVDU.   Marland Kitchen  The history is provided by the patient.    Past Medical History:  Diagnosis Date  . Back disorder   . Bulging lumbar disc   . Depression   . H/O degenerative disc disease   . Hypertension   . Thyroid disease     Patient Active Problem List   Diagnosis Date Noted  . Moderate episode of recurrent major depressive disorder ([place]) 01/06/2016    No past surgical history on file.  OB History    No data available       Home Medications    Prior to Admission medications   Medication Sig Start Date End Date Taking? Authorizing Provider  methimazole (TAPAZOLE) 5 MG tablet Take 5 mg by mouth 2 (two) times daily.   Yes Historical Provider, MD  propranolol ER (INDERAL LA) 60 MG 24 hr capsule Take 60 mg by mouth daily.   Yes Historical Provider, MD  simvastatin (ZOCOR) 20 MG tablet Take 20 mg by mouth daily.   Yes Historical Provider, MD  traMADol (ULTRAM) 50 MG tablet Take 50 mg by mouth as needed.   Yes Historical Provider, MD  HYDROcodone-acetaminophen (NORCO/VICODIN) 5-325 MG tablet Take 1 tablet by mouth every 4 (four) hours as  needed. 05/13/16   Evalee Jefferson, PA-C  HYDROcodone-acetaminophen (NORCO/VICODIN) 5-325 MG tablet Take 1 tablet by mouth every 4 (four) hours as needed. 05/13/16   Evalee Jefferson, PA-C    Family History No family history on file.  Social History Social History  Substance Use Topics  . Smoking status: Current Every Day Smoker    Packs/day: 2.00    Types: Cigarettes  . Smokeless tobacco: Never Used  . Alcohol use Yes     Comment: 01-06-2016 Per pt rarely, 02-05-2016 per pt no but 14yr ago       Allergies   Prednisone and Zithromax [azithromycin]   Review of Systems Review of Systems  Constitutional: Negative for fever.  Respiratory: Negative for shortness of breath.   Cardiovascular: Negative for chest pain and leg swelling.  Gastrointestinal: Negative for abdominal distention, abdominal pain and constipation.  Genitourinary: Negative for difficulty urinating, dysuria, flank pain, frequency and urgency.  Musculoskeletal: Positive for arthralgias and back pain. Negative for gait problem and joint swelling.  Skin: Negative for rash.  Neurological: Negative for weakness and numbness.     Physical Exam Updated Vital Signs BP 150/95 (BP Location: Left Arm)   Pulse 72   Temp 97.6 F (36.4 C) (Oral)   Resp 20   Ht '5\' 4"'$  (  1.626 m)   Wt 72.6 kg   SpO2 96%   BMI 27.46 kg/m   Physical Exam  Constitutional: She appears well-developed and well-nourished.  HENT:  Head: Normocephalic.  Eyes: Conjunctivae are normal.  Neck: Normal range of motion. Neck supple.  Cardiovascular: Normal rate and intact distal pulses.   Pedal pulses normal.  Pulmonary/Chest: Effort normal.  Abdominal: Soft. Bowel sounds are normal. She exhibits no distension and no mass.  Musculoskeletal: Normal range of motion. She exhibits no edema.       Lumbar back: She exhibits tenderness. She exhibits no swelling, no edema and no spasm.  Neurological: She is alert. She has normal strength. She displays no atrophy  and no tremor. No sensory deficit. Gait normal.  Reflex Scores:      Patellar reflexes are 1+ on the right side and 1+ on the left side. No strength deficit noted in hip and knee flexor and extensor muscle groups.  Ankle flexion and extension intact.  Skin: Skin is warm and dry.  Psychiatric: She has a normal mood and affect.  Nursing note and vitals reviewed.    ED Treatments / Results  Labs (all labs ordered are listed, but only abnormal results are displayed) Labs Reviewed - No data to display  EKG  EKG Interpretation None       Radiology No results found.  Procedures Procedures (including critical care time)  Medications Ordered in ED Medications  dexamethasone (DECADRON) injection 10 mg (10 mg Intramuscular Given 05/13/16 2026)     Initial Impression / Assessment and Plan / ED Course  I have reviewed the triage vital signs and the nursing notes.  Pertinent labs & imaging results that were available during my care of the patient were reviewed by me and considered in my medical decision making (see chart for details).     Acute on chronic low back pain.  [place] controlled substance database reviewed which matches pt hx.  She was prescribed a short course of hydrocodone, prepack given to help with pain tonight.  Advised f/u with pcp.  No neuro deficit on exam or by history to suggest emergent or surgical presentation.  Discussed worsened sx that should prompt immediate re-evaluation including distal weakness, bowel/bladder retention/incontinence.         Final Clinical Impressions(s) / ED Diagnoses   Final diagnoses:  Chronic left-sided low back pain with left-sided sciatica    New Prescriptions Discharge Medication List as of 05/13/2016  8:56 PM    START taking these medications   Details  !! HYDROcodone-acetaminophen (NORCO/VICODIN) 5-325 MG tablet Take 1 tablet by mouth every 4 (four) hours as needed., Starting Thu 05/13/2016, Print    !!  HYDROcodone-acetaminophen (NORCO/VICODIN) 5-325 MG tablet Take 1 tablet by mouth every 4 (four) hours as needed., Starting Thu 05/13/2016, Print     !! - Potential duplicate medications found. Please discuss with provider.       Evalee Jefferson, PA-C 05/15/16 1354    Orlie Dakin, MD 05/15/16 [id number]

## 2016-05-17 MED FILL — Hydrocodone-Acetaminophen Tab 5-325 MG: ORAL | Qty: 6 | Status: AC

## 2016-06-01 ENCOUNTER — Other Ambulatory Visit (HOSPITAL_COMMUNITY): Payer: Self-pay | Admitting: Internal Medicine

## 2016-06-01 DIAGNOSIS — M5126 Other intervertebral disc displacement, lumbar region: Secondary | ICD-10-CM

## 2016-06-01 DIAGNOSIS — M810 Age-related osteoporosis without current pathological fracture: Secondary | ICD-10-CM

## 2016-06-01 DIAGNOSIS — M545 Low back pain: Secondary | ICD-10-CM

## 2016-06-01 DIAGNOSIS — M4800 Spinal stenosis, site unspecified: Secondary | ICD-10-CM

## 2016-06-09 ENCOUNTER — Encounter (HOSPITAL_COMMUNITY): Payer: Self-pay

## 2016-06-09 ENCOUNTER — Ambulatory Visit (HOSPITAL_COMMUNITY): Payer: Medicaid Other

## 2016-06-16 ENCOUNTER — Other Ambulatory Visit (HOSPITAL_COMMUNITY): Payer: Self-pay | Admitting: Internal Medicine

## 2016-06-16 ENCOUNTER — Ambulatory Visit (HOSPITAL_COMMUNITY)
Admission: RE | Admit: 2016-06-16 | Discharge: 2016-06-16 | Disposition: A | Discharge status: Home / Self Care | Payer: Medicaid Other | Source: Ambulatory Visit | Attending: Internal Medicine | Admitting: Internal Medicine

## 2016-06-16 DIAGNOSIS — I700 Atherosclerosis of aorta: Secondary | ICD-10-CM | POA: Diagnosis not present

## 2016-06-16 DIAGNOSIS — M545 Low back pain: Secondary | ICD-10-CM

## 2016-06-16 DIAGNOSIS — M5136 Other intervertebral disc degeneration, lumbar region: Secondary | ICD-10-CM | POA: Diagnosis not present

## 2016-06-16 DIAGNOSIS — M5126 Other intervertebral disc displacement, lumbar region: Secondary | ICD-10-CM | POA: Diagnosis not present

## 2016-06-16 DIAGNOSIS — M810 Age-related osteoporosis without current pathological fracture: Secondary | ICD-10-CM | POA: Insufficient documentation

## 2016-06-28 NOTE — Patient Instructions (Signed)
[name]  06/28/2016     '@PREFPERIOPPHARMACY'$ @   Your procedure is scheduled on 07/05/2016  Report to [facility] at 6:30 A.M.  Call this number if you have problems the morning of surgery:  [phone]   Remember:  Do not eat food or drink liquids after midnight.  Take these medicines the morning of surgery with A SIP OF WATER Celebrex if needed, Hydrocodone if needed, Lisinopril, Tapazole, Paxil, Inderal   Do not wear jewelry, make-up or nail polish.  Do not wear lotions, powders, or perfumes, or deoderant.  Do not shave 48 hours prior to surgery.  Men may shave face and neck.  Do not bring valuables to the hospital.  [facility] is not responsible for any belongings or valuables.  Contacts, dentures or bridgework may not be worn into surgery.  Leave your suitcase in the car.  After surgery it may be brought to your room.  For patients admitted to the hospital, discharge time will be determined by your treatment team.  Patients discharged the day of surgery will not be allowed to drive home.    Please read over the following fact sheets that you were given. Anesthesia Post-op Instructions     PATIENT INSTRUCTIONS POST-ANESTHESIA  IMMEDIATELY FOLLOWING SURGERY:  Do not drive or operate machinery for the first twenty four hours after surgery.  Do not make any important decisions for twenty four hours after surgery or while taking narcotic pain medications or sedatives.  If you develop intractable nausea and vomiting or a severe headache please notify your doctor immediately.  FOLLOW-UP:  Please make an appointment with your surgeon as instructed. You do not need to follow up with anesthesia unless specifically instructed to do so.  WOUND CARE INSTRUCTIONS (if applicable):  Keep a dry clean dressing on the anesthesia/puncture wound site if there is drainage.  Once the wound has quit draining you may leave it open to air.  Generally you should leave the bandage intact  for twenty four hours unless there is drainage.  If the epidural site drains for more than 36-48 hours please call the anesthesia department.  QUESTIONS?:  Please feel free to call your physician or the hospital operator if you have any questions, and they will be happy to assist you.      Cataract Surgery Cataract surgery is a procedure to remove a cataract from your eye. A cataract is cloudiness on the lens of your eye. The lens focuses light inside the eye. When a lens becomes cloudy, your vision is affected. Cataract surgery is a procedure to remove the cloudy lens. A substitute lens (intraocular lens or IOL) is usually inserted as a replacement for the cloudy lens. Tell a health care provider about:  Any allergies you have.  All medicines you are taking, including vitamins, herbs, eye drops, creams, and over-the-counter medicines.  Any problems you or family members have had with anesthetic medicines.  Any blood disorders you have.  Any surgeries you have had, especially eye surgeries that include refractive surgery, such as PRK and LASIK.  Any medical conditions you have.  Whether you are pregnant or may be pregnant. What are the risks? Generally, this is a safe procedure. However, problems may occur, including:  Infection.  Bleeding.  Glaucoma.  Retinal detachment.  Allergic reactions to medicines.  Damage to other structures or organs.  Inflammation of the eye.  Clouding of the part of your eye that holds an IOL in place (after-cataract), if  an IOL was inserted. This is fairly common.  An IOL moving out of position, if an IOL was inserted. This is very rare.  Loss of vision. This is rare. What happens before the procedure?  Follow instructions from your health care provider about eating or drinking restrictions.  Ask your health care provider about:  Changing or stopping your regular medicines, including any eye drops you have been prescribed. This is  especially important if you are taking diabetes medicines or blood thinners.  Taking medicines such as aspirin and ibuprofen. These medicines can thin your blood. Do not take these medicines before your procedure if your health care provider instructs you not to.  Do not put contact lenses in either eye on the day of your surgery.  Plan for someone to drive you to and from the procedure.  If you will be going home right after the procedure, plan to have someone with you for 24 hours. What happens during the procedure?  An IV tube may be inserted into one of your veins.  You will be given one or more of the following:  A medicine to help you relax (sedative).  A medicine to numb the area (local anesthetic). This may be numbing eye drops or an injection that is given behind the eye.  A small cut (incision) will be made to the edge of the clear, dome-shaped surface that covers the front of the eye (cornea).  A small probe will be inserted into the eye. This device gives off ultrasound waves that soften and break up the cloudy center of the lens. This makes it easier for the cloudy lens to be removed by suction.  An IOL may be implanted.  Part of the capsule that surrounds the lens will be left in the eye to support the IOL.  Your surgeon may use stitches (sutures) to close the incision. The procedure may vary among health care providers and hospitals. What happens after the procedure?  Your blood pressure, heart rate, breathing rate, and blood oxygen level will be monitored often until the medicines you were given have worn off.  You may be given a protective shield to wear over your eyes.  Do not drive for 24 hours if you received a sedative. This information is not intended to replace advice given to you by your health care provider. Make sure you discuss any questions you have with your health care provider. Document Released: 03/18/2011 Document Revised: 09/04/2015 Document  Reviewed: 02/06/2015 Elsevier Interactive Patient Education  2017 [employer].

## 2016-07-01 ENCOUNTER — Other Ambulatory Visit: Payer: Self-pay

## 2016-07-01 ENCOUNTER — Encounter (HOSPITAL_COMMUNITY)
Admission: RE | Admit: 2016-07-01 | Discharge: 2016-07-01 | Disposition: A | Discharge status: Home / Self Care | Payer: Medicaid Other | Source: Ambulatory Visit | Attending: Ophthalmology | Admitting: Ophthalmology

## 2016-07-01 ENCOUNTER — Encounter (HOSPITAL_COMMUNITY): Payer: Self-pay

## 2016-07-01 DIAGNOSIS — I10 Essential (primary) hypertension: Secondary | ICD-10-CM | POA: Diagnosis not present

## 2016-07-01 DIAGNOSIS — R9431 Abnormal electrocardiogram [ECG] [EKG]: Secondary | ICD-10-CM | POA: Insufficient documentation

## 2016-07-01 DIAGNOSIS — E039 Hypothyroidism, unspecified: Secondary | ICD-10-CM | POA: Insufficient documentation

## 2016-07-01 DIAGNOSIS — F329 Major depressive disorder, single episode, unspecified: Secondary | ICD-10-CM | POA: Diagnosis not present

## 2016-07-01 DIAGNOSIS — Z01812 Encounter for preprocedural laboratory examination: Secondary | ICD-10-CM | POA: Insufficient documentation

## 2016-07-01 DIAGNOSIS — M5126 Other intervertebral disc displacement, lumbar region: Secondary | ICD-10-CM | POA: Diagnosis not present

## 2016-07-01 DIAGNOSIS — Z01810 Encounter for preprocedural cardiovascular examination: Secondary | ICD-10-CM | POA: Insufficient documentation

## 2016-07-01 HISTORY — DX: Unspecified osteoarthritis, unspecified site: M19.90

## 2016-07-01 HISTORY — DX: Hypothyroidism, unspecified: E03.9

## 2016-07-01 LAB — BASIC METABOLIC PANEL
Anion gap: 9 (ref 5–15)
BUN: 10 mg/dL (ref 6–20)
CALCIUM: 9 mg/dL (ref 8.9–10.3)
CO2: 24 mmol/L (ref 22–32)
CREATININE: 0.92 mg/dL (ref 0.44–1.00)
Chloride: 105 mmol/L (ref 101–111)
Glucose, Bld: 100 mg/dL — ABNORMAL HIGH (ref 65–99)
Potassium: 3.9 mmol/L (ref 3.5–5.1)
SODIUM: 138 mmol/L (ref 135–145)

## 2016-07-01 LAB — CBC WITH DIFFERENTIAL/PLATELET
BASOS ABS: 0.1 10*3/uL (ref 0.0–0.1)
BASOS PCT: 1 %
EOS ABS: 0.2 10*3/uL (ref 0.0–0.7)
EOS PCT: 2 %
HEMATOCRIT: 49.1 % — AB (ref 36.0–46.0)
Hemoglobin: 16.8 g/dL — ABNORMAL HIGH (ref 12.0–15.0)
Lymphocytes Relative: 28 %
Lymphs Abs: 2.8 10*3/uL (ref 0.7–4.0)
MCH: 30.8 pg (ref 26.0–34.0)
MCHC: 34.2 g/dL (ref 30.0–36.0)
MCV: 89.9 fL (ref 78.0–100.0)
MONO ABS: 0.5 10*3/uL (ref 0.1–1.0)
MONOS PCT: 5 %
Neutro Abs: 6.5 10*3/uL (ref 1.7–7.7)
Neutrophils Relative %: 64 %
PLATELETS: 280 10*3/uL (ref 150–400)
RBC: 5.46 MIL/uL — ABNORMAL HIGH (ref 3.87–5.11)
RDW: 13.2 % (ref 11.5–15.5)
WBC: 10.2 10*3/uL (ref 4.0–10.5)

## 2016-07-02 MED ORDER — TETRACAINE HCL 0.5 % OP SOLN
OPHTHALMIC | Status: AC
  Filled 2016-07-02: qty 4

## 2016-07-02 MED ORDER — PHENYLEPHRINE HCL 2.5 % OP SOLN
OPHTHALMIC | Status: AC
  Filled 2016-07-02: qty 15

## 2016-07-02 MED ORDER — LIDOCAINE HCL 3.5 % OP GEL
OPHTHALMIC | Status: AC
  Filled 2016-07-02: qty 1

## 2016-07-02 MED ORDER — LIDOCAINE HCL (PF) 1 % IJ SOLN
INTRAMUSCULAR | Status: AC
  Filled 2016-07-02: qty 2

## 2016-07-02 MED ORDER — CYCLOPENTOLATE-PHENYLEPHRINE 0.2-1 % OP SOLN
OPHTHALMIC | Status: AC
  Filled 2016-07-02: qty 2

## 2016-07-02 MED ORDER — NEOMYCIN-POLYMYXIN-DEXAMETH 3.5-10000-0.1 OP SUSP
OPHTHALMIC | Status: AC
  Filled 2016-07-02: qty 5

## 2016-07-05 ENCOUNTER — Ambulatory Visit (HOSPITAL_COMMUNITY): Payer: Medicaid Other | Admitting: Anesthesiology

## 2016-07-05 ENCOUNTER — Ambulatory Visit (HOSPITAL_COMMUNITY)
Admission: RE | Admit: 2016-07-05 | Discharge: 2016-07-05 | Disposition: A | Discharge status: Home / Self Care | Payer: Medicaid Other | Source: Ambulatory Visit | Attending: Ophthalmology | Admitting: Ophthalmology

## 2016-07-05 ENCOUNTER — Encounter (HOSPITAL_COMMUNITY): Payer: Self-pay | Admitting: *Deleted

## 2016-07-05 ENCOUNTER — Encounter (HOSPITAL_COMMUNITY)
Admission: RE | Disposition: A | Discharge status: Home / Self Care | Payer: Self-pay | Source: Ambulatory Visit | Attending: Ophthalmology

## 2016-07-05 ENCOUNTER — Other Ambulatory Visit: Payer: Self-pay | Admitting: Adult Health

## 2016-07-05 DIAGNOSIS — H25811 Combined forms of age-related cataract, right eye: Secondary | ICD-10-CM | POA: Insufficient documentation

## 2016-07-05 DIAGNOSIS — F17200 Nicotine dependence, unspecified, uncomplicated: Secondary | ICD-10-CM | POA: Diagnosis not present

## 2016-07-05 DIAGNOSIS — E039 Hypothyroidism, unspecified: Secondary | ICD-10-CM | POA: Insufficient documentation

## 2016-07-05 DIAGNOSIS — I10 Essential (primary) hypertension: Secondary | ICD-10-CM | POA: Diagnosis not present

## 2016-07-05 DIAGNOSIS — Z79899 Other long term (current) drug therapy: Secondary | ICD-10-CM | POA: Insufficient documentation

## 2016-07-05 DIAGNOSIS — F329 Major depressive disorder, single episode, unspecified: Secondary | ICD-10-CM | POA: Insufficient documentation

## 2016-07-05 HISTORY — PX: CATARACT EXTRACTION W/PHACO: SHX586

## 2016-07-05 SURGERY — PHACOEMULSIFICATION, CATARACT, WITH IOL INSERTION
Anesthesia: Monitor Anesthesia Care | Site: Eye | Laterality: Right

## 2016-07-05 MED ORDER — POVIDONE-IODINE 5 % OP SOLN
OPHTHALMIC | Status: DC | PRN
  Administered 2016-07-05: 1 via OPHTHALMIC

## 2016-07-05 MED ORDER — LIDOCAINE HCL (PF) 1 % IJ SOLN
INTRAMUSCULAR | Status: DC | PRN
  Administered 2016-07-05: .5 mL

## 2016-07-05 MED ORDER — MIDAZOLAM HCL 2 MG/2ML IJ SOLN
1.0000 mg | INTRAMUSCULAR | Status: AC
  Administered 2016-07-05 (×2): 2 mg via INTRAVENOUS

## 2016-07-05 MED ORDER — MIDAZOLAM HCL 2 MG/2ML IJ SOLN
INTRAMUSCULAR | Status: AC
  Filled 2016-07-05: qty 2

## 2016-07-05 MED ORDER — EPINEPHRINE PF 1 MG/ML IJ SOLN
INTRAMUSCULAR | Status: DC | PRN
  Administered 2016-07-05: 500 mL

## 2016-07-05 MED ORDER — PROVISC 10 MG/ML IO SOLN
INTRAOCULAR | Status: DC | PRN
  Administered 2016-07-05: 0.85 mL via INTRAOCULAR

## 2016-07-05 MED ORDER — CYCLOPENTOLATE-PHENYLEPHRINE 0.2-1 % OP SOLN
1.0000 [drp] | OPHTHALMIC | Status: AC
  Administered 2016-07-05 (×3): 1 [drp] via OPHTHALMIC

## 2016-07-05 MED ORDER — NEOMYCIN-POLYMYXIN-DEXAMETH 3.5-10000-0.1 OP SUSP
OPHTHALMIC | Status: DC | PRN
  Administered 2016-07-05: 2 [drp] via OPHTHALMIC

## 2016-07-05 MED ORDER — FENTANYL CITRATE (PF) 100 MCG/2ML IJ SOLN
25.0000 ug | Freq: Once | INTRAMUSCULAR | Status: AC
  Administered 2016-07-05: 25 ug via INTRAVENOUS

## 2016-07-05 MED ORDER — LIDOCAINE HCL 3.5 % OP GEL
1.0000 "application " | Freq: Once | OPHTHALMIC | Status: AC
  Administered 2016-07-05: 1 via OPHTHALMIC

## 2016-07-05 MED ORDER — LACTATED RINGERS IV SOLN
INTRAVENOUS | Status: DC
  Administered 2016-07-05: 08:00:00 via INTRAVENOUS

## 2016-07-05 MED ORDER — BSS IO SOLN
INTRAOCULAR | Status: DC | PRN
  Administered 2016-07-05: 15 mL

## 2016-07-05 MED ORDER — PHENYLEPHRINE HCL 2.5 % OP SOLN
1.0000 [drp] | OPHTHALMIC | Status: AC
  Administered 2016-07-05 (×3): 1 [drp] via OPHTHALMIC

## 2016-07-05 MED ORDER — FENTANYL CITRATE (PF) 100 MCG/2ML IJ SOLN
INTRAMUSCULAR | Status: AC
  Filled 2016-07-05: qty 2

## 2016-07-05 MED ORDER — TETRACAINE HCL 0.5 % OP SOLN
1.0000 [drp] | OPHTHALMIC | Status: AC
  Administered 2016-07-05 (×3): 1 [drp] via OPHTHALMIC

## 2016-07-05 SURGICAL SUPPLY — 12 items
CLOTH BEACON ORANGE TIMEOUT ST (SAFETY) ×3 IMPLANT
EYE SHIELD UNIVERSAL CLEAR (GAUZE/BANDAGES/DRESSINGS) ×3 IMPLANT
GLOVE BIOGEL PI IND STRL 7.0 (GLOVE) ×1 IMPLANT
GLOVE BIOGEL PI INDICATOR 7.0 (GLOVE) ×2
GLOVE EXAM NITRILE MD LF STRL (GLOVE) ×3 IMPLANT
PAD ARMBOARD 7.5X6 YLW CONV (MISCELLANEOUS) ×3 IMPLANT
RING MALYGIN (MISCELLANEOUS) ×3 IMPLANT
SIGHTPATH CAT PROC W REG LENS (Ophthalmic Related) ×3 IMPLANT
SYRINGE LUER LOK 1CC (MISCELLANEOUS) ×3 IMPLANT
TAPE SURG TRANSPORE 1 IN (GAUZE/BANDAGES/DRESSINGS) ×1 IMPLANT
TAPE SURGICAL TRANSPORE 1 IN (GAUZE/BANDAGES/DRESSINGS) ×2
WATER STERILE IRR 250ML POUR (IV SOLUTION) ×3 IMPLANT

## 2016-07-05 NOTE — Transfer of Care (Signed)
Immediate Anesthesia Transfer of Care Note  Patient: [name]  Procedure(s) Performed: Procedure(s) with comments: CATARACT EXTRACTION PHACO AND INTRAOCULAR LENS PLACEMENT (IOC) (Right) - CDE: 15.41  Patient Location: Short Stay  Anesthesia Type:MAC  Level of Consciousness: awake, alert , oriented and patient cooperative  Airway & Oxygen Therapy: Patient Spontanous Breathing  Post-op Assessment: Report given to RN and Post -op Vital signs reviewed and stable  Post vital signs: Reviewed and stable  Last Vitals:  Vitals:   07/05/16 0740  Temp: 36.7 C    Last Pain:  Vitals:   07/05/16 0740  TempSrc: Oral      Patients Stated Pain Goal: 8 (15/17/61 6073)  Complications: No apparent anesthesia complications

## 2016-07-05 NOTE — Anesthesia Procedure Notes (Signed)
Procedure Name: MAC Date/Time: 07/05/2016 8:29 AM Performed by: Andree Elk, Alfie Rideaux A Pre-anesthesia Checklist: Patient identified, Timeout performed, Suction available, Emergency Drugs available and Patient being monitored Oxygen Delivery Method: Nasal cannula

## 2016-07-05 NOTE — Anesthesia Postprocedure Evaluation (Signed)
Anesthesia Post Note  Patient: [name]  Procedure(s) Performed: Procedure(s) (LRB): CATARACT EXTRACTION PHACO AND INTRAOCULAR LENS PLACEMENT (IOC) (Right)  Patient location during evaluation: Short Stay Anesthesia Type: MAC Level of consciousness: awake and alert and oriented Pain management: pain level controlled Vital Signs Assessment: post-procedure vital signs reviewed and stable Respiratory status: spontaneous breathing Cardiovascular status: stable Postop Assessment: no signs of nausea or vomiting Anesthetic complications: no     Last Vitals:  Vitals:   07/05/16 0740  Temp: 36.7 C    Last Pain:  Vitals:   07/05/16 0740  TempSrc: Oral                 ADAMS, AMY A

## 2016-07-05 NOTE — H&P (Signed)
I have reviewed the H&P, the patient was re-examined, and I have identified no interval changes in medical condition and plan of care since the history and physical of record  

## 2016-07-05 NOTE — Anesthesia Preprocedure Evaluation (Signed)
Anesthesia Evaluation  Patient identified by MRN, date of birth, ID band Patient awake    Reviewed: Allergy & Precautions, NPO status , Patient's Chart, lab work & pertinent test results  Airway Mallampati: III  TM Distance: >3 FB     Dental  (+) Edentulous Upper, Partial Lower   Pulmonary Current Smoker,    breath sounds clear to auscultation       Cardiovascular hypertension, Pt. on medications  Rhythm:Regular Rate:Normal     Neuro/Psych PSYCHIATRIC DISORDERS Depression    GI/Hepatic negative GI ROS,   Endo/Other  Hypothyroidism   Renal/GU      Musculoskeletal   Abdominal   Peds  Hematology   Anesthesia Other Findings   Reproductive/Obstetrics                             Anesthesia Physical Anesthesia Plan  ASA: III  Anesthesia Plan: MAC   Post-op Pain Management:    Induction: Intravenous  Airway Management Planned: Nasal Cannula  Additional Equipment:   Intra-op Plan:   Post-operative Plan:   Informed Consent: I have reviewed the patients History and Physical, chart, labs and discussed the procedure including the risks, benefits and alternatives for the proposed anesthesia with the patient or authorized representative who has indicated his/her understanding and acceptance.     Plan Discussed with:   Anesthesia Plan Comments:         Anesthesia Quick Evaluation

## 2016-07-05 NOTE — Op Note (Signed)
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  Assistants: None  Anesthesia: Topical with MAC  Indications: Painless, progressive loss of vision with compromise of daily activities.  Surgery: Cataract Extraction with Intraocular lens Implant Right Eye  Discription: The patient had dilating drops and viscous lidocaine placed into the Right eye in the pre-op holding area. After transfer to the operating room, a time out was performed. The patient was then prepped and draped. Beginning with a 81 degree blade a paracentesis port was made at the surgeon's 2 o'clock position. The anterior chamber was then filled with 1% non-preserved lidocaine. This was followed by filling the anterior chamber with Provisc.  A 2.52m keratome blade was used to make a clear corneal incision at the temporal limbus.  A bent cystatome needle was used to create a continuous tear capsulotomy. Hydrodissection was performed with balanced salt solution on a Fine canula. The lens nucleus was then removed using the phacoemulsification handpiece. Residual cortex was removed with the I&A handpiece. The anterior chamber and capsular bag were refilled with Provisc. A posterior chamber intraocular lens was placed into the capsular bag with it's injector. The implant was positioned with the Kuglan hook. The Provisc was then removed from the anterior chamber and capsular bag with the I&A handpiece. Stromal hydration of the main incision and paracentesis port was performed with BSS on a Fine canula. The wounds were tested for leak which was negative. The patient tolerated the procedure well. There were no operative complications. The patient was then transferred to the recovery room in stable condition.  Complications: None  Specimen: None  EBL: None  Prosthetic device: Abbott Technis, PCB00,  power 23.0, SN [id number]

## 2016-07-07 ENCOUNTER — Encounter (HOSPITAL_COMMUNITY): Payer: Self-pay

## 2016-07-07 ENCOUNTER — Encounter (HOSPITAL_COMMUNITY): Payer: Self-pay | Admitting: Ophthalmology

## 2016-07-07 ENCOUNTER — Ambulatory Visit (HOSPITAL_COMMUNITY): Payer: Medicaid Other

## 2016-07-14 ENCOUNTER — Encounter (HOSPITAL_COMMUNITY): Payer: Self-pay

## 2016-07-15 ENCOUNTER — Encounter (HOSPITAL_COMMUNITY)
Admission: RE | Admit: 2016-07-15 | Discharge: 2016-07-15 | Disposition: A | Discharge status: Home / Self Care | Payer: Medicaid Other | Source: Ambulatory Visit | Attending: Ophthalmology | Admitting: Ophthalmology

## 2016-07-19 ENCOUNTER — Ambulatory Visit (HOSPITAL_COMMUNITY)
Admission: RE | Admit: 2016-07-19 | Discharge: 2016-07-19 | Disposition: A | Discharge status: Home / Self Care | Payer: Medicaid Other | Source: Ambulatory Visit | Attending: Ophthalmology | Admitting: Ophthalmology

## 2016-07-19 ENCOUNTER — Ambulatory Visit (HOSPITAL_COMMUNITY): Payer: Medicaid Other | Admitting: Anesthesiology

## 2016-07-19 ENCOUNTER — Encounter (HOSPITAL_COMMUNITY)
Admission: RE | Disposition: A | Discharge status: Home / Self Care | Payer: Self-pay | Source: Ambulatory Visit | Attending: Ophthalmology

## 2016-07-19 ENCOUNTER — Encounter (HOSPITAL_COMMUNITY): Payer: Self-pay | Admitting: *Deleted

## 2016-07-19 DIAGNOSIS — I10 Essential (primary) hypertension: Secondary | ICD-10-CM | POA: Insufficient documentation

## 2016-07-19 DIAGNOSIS — F17200 Nicotine dependence, unspecified, uncomplicated: Secondary | ICD-10-CM | POA: Insufficient documentation

## 2016-07-19 DIAGNOSIS — Z79899 Other long term (current) drug therapy: Secondary | ICD-10-CM | POA: Diagnosis not present

## 2016-07-19 DIAGNOSIS — E039 Hypothyroidism, unspecified: Secondary | ICD-10-CM | POA: Diagnosis not present

## 2016-07-19 DIAGNOSIS — H25812 Combined forms of age-related cataract, left eye: Secondary | ICD-10-CM | POA: Diagnosis present

## 2016-07-19 DIAGNOSIS — F329 Major depressive disorder, single episode, unspecified: Secondary | ICD-10-CM | POA: Insufficient documentation

## 2016-07-19 HISTORY — PX: CATARACT EXTRACTION W/PHACO: SHX586

## 2016-07-19 SURGERY — PHACOEMULSIFICATION, CATARACT, WITH IOL INSERTION
Anesthesia: Monitor Anesthesia Care | Site: Eye | Laterality: Left

## 2016-07-19 MED ORDER — LIDOCAINE HCL 3.5 % OP GEL
1.0000 "application " | Freq: Once | OPHTHALMIC | Status: AC
  Administered 2016-07-19: 1 via OPHTHALMIC

## 2016-07-19 MED ORDER — FENTANYL CITRATE (PF) 100 MCG/2ML IJ SOLN
25.0000 ug | INTRAMUSCULAR | Status: AC
  Administered 2016-07-19: 25 ug via INTRAVENOUS

## 2016-07-19 MED ORDER — PHENYLEPHRINE HCL 2.5 % OP SOLN
1.0000 [drp] | OPHTHALMIC | Status: AC
  Administered 2016-07-19 (×3): 1 [drp] via OPHTHALMIC

## 2016-07-19 MED ORDER — LIDOCAINE HCL (PF) 1 % IJ SOLN
INTRAMUSCULAR | Status: DC | PRN
  Administered 2016-07-19: .6 mL

## 2016-07-19 MED ORDER — MIDAZOLAM HCL 2 MG/2ML IJ SOLN
1.0000 mg | INTRAMUSCULAR | Status: AC
  Administered 2016-07-19: 2 mg via INTRAVENOUS

## 2016-07-19 MED ORDER — EPINEPHRINE PF 1 MG/ML IJ SOLN
INTRAOCULAR | Status: DC | PRN
  Administered 2016-07-19: 500 mL

## 2016-07-19 MED ORDER — CYCLOPENTOLATE-PHENYLEPHRINE 0.2-1 % OP SOLN
1.0000 [drp] | OPHTHALMIC | Status: AC
  Administered 2016-07-19 (×3): 1 [drp] via OPHTHALMIC

## 2016-07-19 MED ORDER — BSS IO SOLN
INTRAOCULAR | Status: DC | PRN
Start: 2016-07-19 — End: 2016-07-19
  Administered 2016-07-19: 15 mL

## 2016-07-19 MED ORDER — FENTANYL CITRATE (PF) 100 MCG/2ML IJ SOLN
INTRAMUSCULAR | Status: AC
  Filled 2016-07-19: qty 2

## 2016-07-19 MED ORDER — NEOMYCIN-POLYMYXIN-DEXAMETH 3.5-10000-0.1 OP SUSP
OPHTHALMIC | Status: DC | PRN
  Administered 2016-07-19: 2 [drp] via OPHTHALMIC

## 2016-07-19 MED ORDER — PROVISC 10 MG/ML IO SOLN
INTRAOCULAR | Status: DC | PRN
Start: 2016-07-19 — End: 2016-07-19
  Administered 2016-07-19: 0.85 mL via INTRAOCULAR

## 2016-07-19 MED ORDER — LACTATED RINGERS IV SOLN
INTRAVENOUS | Status: DC
  Administered 2016-07-19: 09:00:00 via INTRAVENOUS

## 2016-07-19 MED ORDER — EPINEPHRINE PF 1 MG/ML IJ SOLN
INTRAMUSCULAR | Status: AC
  Filled 2016-07-19: qty 1

## 2016-07-19 MED ORDER — TETRACAINE HCL 0.5 % OP SOLN
1.0000 [drp] | OPHTHALMIC | Status: AC
  Administered 2016-07-19 (×3): 1 [drp] via OPHTHALMIC

## 2016-07-19 MED ORDER — POVIDONE-IODINE 5 % OP SOLN
OPHTHALMIC | Status: DC | PRN
  Administered 2016-07-19: 1 via OPHTHALMIC

## 2016-07-19 SURGICAL SUPPLY — 22 items

## 2016-07-19 NOTE — Anesthesia Postprocedure Evaluation (Signed)
Anesthesia Post Note  Patient: [name]  Procedure(s) Performed: Procedure(s) (LRB): CATARACT EXTRACTION PHACO AND INTRAOCULAR LENS PLACEMENT LEFT EYE CDE= 12.65 (Left)  Patient location during evaluation: Short Stay Anesthesia Type: MAC Level of consciousness: awake and alert Pain management: pain level controlled Vital Signs Assessment: post-procedure vital signs reviewed and stable Respiratory status: spontaneous breathing Anesthetic complications: no     Last Vitals:  Vitals:   07/19/16 0845 07/19/16 0900  BP: 125/75 116/70  Resp: 19 18  Temp:      Last Pain:  Vitals:   07/19/16 0805  TempSrc: Oral                 Kirsta Probert

## 2016-07-19 NOTE — Anesthesia Procedure Notes (Signed)
Procedure Name: MAC Date/Time: 07/19/2016 9:03 AM Performed by: Vista Deck Pre-anesthesia Checklist: Patient identified, Emergency Drugs available, Suction available, Timeout performed and Patient being monitored Patient Re-evaluated:Patient Re-evaluated prior to inductionOxygen Delivery Method: Nasal Cannula

## 2016-07-19 NOTE — Interval H&P Note (Signed)
History and Physical Interval Note:  07/19/2016 8:59 AM  [name]  has presented today for surgery, with the diagnosis of nuclear cataract left eye  The various methods of treatment have been discussed with the patient and family. After consideration of risks, benefits and other options for treatment, the patient has consented to  Procedure(s) with comments: CATARACT EXTRACTION PHACO AND INTRAOCULAR LENS PLACEMENT ([place]) (Left) - left as a surgical intervention .  The patient's history has been reviewed, patient examined, no change in status, stable for surgery.  I have reviewed the patient's chart and labs.  Questions were answered to the patient's satisfaction.     Londen Lorge

## 2016-07-19 NOTE — Op Note (Signed)
Date of Admission: 07/19/2016  Date of Surgery: 07/19/2016  Pre-Op Dx: Cataract Left  Eye  Post-Op Dx: Senile Combined Cataract  Left  Eye,  Dx Code W09.811  Surgeon: Tonny Branch, M.D.  Assistants: None  Anesthesia: Topical with MAC  Indications: Painless, progressive loss of vision with compromise of daily activities.  Surgery: Cataract Extraction with Intraocular lens Implant Left Eye  Discription: The patient had dilating drops and viscous lidocaine placed into the Left eye in the pre-op holding area. After transfer to the operating room, a time out was performed. The patient was then prepped and draped. Beginning with a 81 degree blade a paracentesis port was made at the surgeon's 2 o'clock position. The anterior chamber was then filled with 1% non-preserved lidocaine. This was followed by filling the anterior chamber with Provisc.  A 2.62m keratome blade was used to make a clear corneal incision at the temporal limbus.  A bent cystatome needle was used to create a continuous tear capsulotomy. Hydrodissection was performed with balanced salt solution on a Fine canula. The lens nucleus was then removed using the phacoemulsification handpiece. Residual cortex was removed with the I&A handpiece. The anterior chamber and capsular bag were refilled with Provisc. A posterior chamber intraocular lens was placed into the capsular bag with it's injector. The implant was positioned with the Kuglan hook. The Provisc was then removed from the anterior chamber and capsular bag with the I&A handpiece. Stromal hydration of the main incision and paracentesis port was performed with BSS on a Fine canula. The wounds were tested for leak which was negative. The patient tolerated the procedure well. There were no operative complications. The patient was then transferred to the recovery room in stable condition.  Complications: None  Specimen: None  EBL: None  Prosthetic device: Abbott Technis, PCB00, power  23.5, SN [id number]

## 2016-07-19 NOTE — Transfer of Care (Signed)
Immediate Anesthesia Transfer of Care Note  Patient: [name]  Procedure(s) Performed: Procedure(s) (LRB): CATARACT EXTRACTION PHACO AND INTRAOCULAR LENS PLACEMENT LEFT EYE CDE= 12.65 (Left)  Patient Location: Shortstay  Anesthesia Type: MAC  Level of Consciousness: awake  Airway & Oxygen Therapy: Patient Spontanous Breathing   Post-op Assessment: Report given to PACU RN, Post -op Vital signs reviewed and stable and Patient moving all extremities  Post vital signs: Reviewed and stable  Complications: No apparent anesthesia complications

## 2016-07-19 NOTE — Discharge Instructions (Signed)
Moderate Conscious Sedation, Adult, Care After These instructions provide you with information about caring for yourself after your procedure. Your health care provider may also give you more specific instructions. Your treatment has been planned according to current medical practices, but problems sometimes occur. Call your health care provider if you have any problems or questions after your procedure. What can I expect after the procedure? After your procedure, it is common:  To feel sleepy for several hours.  To feel clumsy and have poor balance for several hours.  To have poor judgment for several hours.  To vomit if you eat too soon. Follow these instructions at home: For at least 24 hours after the procedure:    Do not:  Participate in activities where you could fall or become injured.  Drive.  Use heavy machinery.  Drink alcohol.  Take sleeping pills or medicines that cause drowsiness.  Make important decisions or sign legal documents.  Take care of children on your own.  Rest. Eating and drinking   Follow the diet recommended by your health care provider.  If you vomit:  Drink water, juice, or soup when you can drink without vomiting.  Make sure you have little or no nausea before eating solid foods. General instructions   Have a responsible adult stay with you until you are awake and alert.  Take over-the-counter and prescription medicines only as told by your health care provider.  If you smoke, do not smoke without supervision.  Keep all follow-up visits as told by your health care provider. This is important. Contact a health care provider if:  You keep feeling nauseous or you keep vomiting.  You feel light-headed.  You develop a rash.  You have a fever. Get help right away if:  You have trouble breathing. This information is not intended to replace advice given to you by your health care provider. Make sure you discuss any questions you  have with your health care provider. Document Released: 01/17/2013 Document Revised: 09/01/2015 Document Reviewed: 07/19/2015 Elsevier Interactive Patient Education  2017 [employer].

## 2016-07-19 NOTE — H&P (View-Only) (Signed)
I have reviewed the H&P, the patient was re-examined, and I have identified no interval changes in medical condition and plan of care since the history and physical of record  

## 2016-07-19 NOTE — Anesthesia Preprocedure Evaluation (Signed)
Anesthesia Evaluation  Patient identified by MRN, date of birth, ID band Patient awake    Reviewed: Allergy & Precautions, NPO status , Patient's Chart, lab work & pertinent test results  Airway Mallampati: III  TM Distance: >3 FB     Dental  (+) Edentulous Upper, Partial Lower   Pulmonary Current Smoker,    breath sounds clear to auscultation       Cardiovascular hypertension, Pt. on medications  Rhythm:Regular Rate:Normal     Neuro/Psych PSYCHIATRIC DISORDERS Depression    GI/Hepatic negative GI ROS,   Endo/Other  Hypothyroidism   Renal/GU      Musculoskeletal   Abdominal   Peds  Hematology   Anesthesia Other Findings   Reproductive/Obstetrics                             Anesthesia Physical Anesthesia Plan  ASA: III  Anesthesia Plan: MAC   Post-op Pain Management:    Induction: Intravenous  Airway Management Planned: Nasal Cannula  Additional Equipment:   Intra-op Plan:   Post-operative Plan:   Informed Consent: I have reviewed the patients History and Physical, chart, labs and discussed the procedure including the risks, benefits and alternatives for the proposed anesthesia with the patient or authorized representative who has indicated his/her understanding and acceptance.     Plan Discussed with:   Anesthesia Plan Comments:         Anesthesia Quick Evaluation  

## 2016-07-20 ENCOUNTER — Encounter (HOSPITAL_COMMUNITY): Payer: Self-pay | Admitting: Ophthalmology

## 2016-08-23 ENCOUNTER — Other Ambulatory Visit: Payer: Self-pay | Admitting: Physician Assistant

## 2016-08-23 DIAGNOSIS — M5442 Lumbago with sciatica, left side: Secondary | ICD-10-CM

## 2016-09-07 ENCOUNTER — Other Ambulatory Visit: Payer: Self-pay

## 2016-09-08 ENCOUNTER — Encounter: Payer: Self-pay | Admitting: Advanced Practice Midwife

## 2016-09-08 ENCOUNTER — Other Ambulatory Visit (HOSPITAL_COMMUNITY): Payer: Self-pay | Admitting: Orthopedic Surgery

## 2016-09-08 ENCOUNTER — Ambulatory Visit (INDEPENDENT_AMBULATORY_CARE_PROVIDER_SITE_OTHER): Payer: Medicaid Other | Admitting: Advanced Practice Midwife

## 2016-09-08 ENCOUNTER — Other Ambulatory Visit (HOSPITAL_COMMUNITY)
Admission: RE | Admit: 2016-09-08 | Discharge: 2016-09-08 | Disposition: A | Discharge status: Home / Self Care | Payer: Medicaid Other | Source: Ambulatory Visit | Attending: Advanced Practice Midwife | Admitting: Advanced Practice Midwife

## 2016-09-08 VITALS — BP 130/90 | HR 80 | Ht 64.0 in | Wt 160.0 lb

## 2016-09-08 DIAGNOSIS — Z0000 Encounter for general adult medical examination without abnormal findings: Secondary | ICD-10-CM

## 2016-09-08 DIAGNOSIS — Z01419 Encounter for gynecological examination (general) (routine) without abnormal findings: Secondary | ICD-10-CM | POA: Diagnosis not present

## 2016-09-08 DIAGNOSIS — M5441 Lumbago with sciatica, right side: Secondary | ICD-10-CM

## 2016-09-08 DIAGNOSIS — M5442 Lumbago with sciatica, left side: Principal | ICD-10-CM

## 2016-09-08 NOTE — Patient Instructions (Signed)
Luvena (or similar) vaginal moisturizer twice a week for a few weeks, then may use 2-3 times a month.

## 2016-09-08 NOTE — Progress Notes (Signed)
[name] 61 y.o.  Vitals:   09/08/16 1421  BP: 130/90  Pulse: 80     Filed Weights   09/08/16 1421  Weight: 160 lb (72.6 kg)    Past Medical History: Past Medical History:  Diagnosis Date  . Arthritis    spinal stenosis  . Back disorder   . Bulging lumbar disc   . Depression   . H/O degenerative disc disease   . Hypertension   . Hypothyroidism   . Thyroid disease     Past Surgical History: Past Surgical History:  Procedure Laterality Date  . CATARACT EXTRACTION W/PHACO Right 07/05/2016   Procedure: CATARACT EXTRACTION PHACO AND INTRAOCULAR LENS PLACEMENT (IOC);  Surgeon: Tonny Branch, MD;  Location: [facility];  Service: Ophthalmology;  Laterality: Right;  CDE: 15.41  . CATARACT EXTRACTION W/PHACO Left 07/19/2016   Procedure: CATARACT EXTRACTION PHACO AND INTRAOCULAR LENS PLACEMENT LEFT EYE CDE= 12.65;  Surgeon: Tonny Branch, MD;  Location: [facility];  Service: Ophthalmology;  Laterality: Left;  left  . TUBAL LIGATION      Family History: Family History  Problem Relation Age of Onset  . Hypertension Mother   . Congenital heart disease Mother   . Diabetes Mother   . Thyroid disease Brother     Social History: Social History  Substance Use Topics  . Smoking status: Current Every Day Smoker    Packs/day: 1.50    Years: 44.00    Types: Cigarettes  . Smokeless tobacco: Never Used  . Alcohol use No     Comment: 01-06-2016 Per pt rarely, 02-05-2016 per pt no but 41yrs ago      Allergies:  Allergies  Allergen Reactions  . Prednisone     Sick to stomach  . Zithromax [Azithromycin] Other (See Comments)    Blisters in mouth       Current Outpatient Prescriptions:  .  alendronate (FOSAMAX) 70 MG tablet, Take 70 mg by mouth once a week. Take with a full glass of water on an empty stomach. sundays, Disp: , Rfl:  .  Cholecalciferol (VITAMIN D3) 1000 units CAPS, Take 1,000 Units by mouth daily., Disp: , Rfl:  .  COD LIVER OIL PO, Take 415 mg by mouth daily., Disp: ,  Rfl:  .  lisinopril (PRINIVIL,ZESTRIL) 20 MG tablet, Take 20 mg by mouth daily., Disp: , Rfl:  .  lovastatin (MEVACOR) 40 MG tablet, Take 40 mg by mouth at bedtime., Disp: , Rfl:  .  methimazole (TAPAZOLE) 5 MG tablet, Take 5 mg by mouth 2 (two) times daily., Disp: , Rfl:  .  PARoxetine (PAXIL) 40 MG tablet, Take 40 mg by mouth daily., Disp: , Rfl:  .  propranolol ER (INDERAL LA) 60 MG 24 hr capsule, Take 60 mg by mouth 2 (two) times daily. , Disp: , Rfl:   History of Present Illness: here for pap (last pap>5years).  Also, has had "dropped bladder forever" and wants it fixed.  Has vagianl dryness, doesn't have (or want to have) sex. Has PCP ([facility]) that does labs.  Declines colonscopy   Review of Systems   Patient denies any headaches, blurred vision, shortness of breath, chest pain, abdominal pain, problems with bowel movements, urination, or intercourse.   Physical Exam: General:  Well developed, well nourished, no acute distress Skin:  Warm and dry Neck:  Midline trachea, normal thyroid Lungs; Clear to auscultation bilaterally Breast:  No dominant palpable mass, retraction, or nipple discharge Cardiovascular: Regular rate and rhythm Abdomen:  Soft,  non tender, no hepatosplenomegaly Pelvic:  External genitalia is normal in appearance.  The vagina is normal in appearance other than being pale and dry. .The bladder is at the introitus  The cervix is bulbous.  Uterus is felt to be normal size, shape, and contour. , exam limited by habitus No adnexal masses or tenderness noted.  Extremities:  No swelling or varicosities noted Psych:  No mood changes.     Impression: Normal GYN exam except for bladder decensus     Plan: See surgeon for bladder Luvena vaginal moisturizer

## 2016-09-12 LAB — CYTOLOGY - PAP: HPV: DETECTED — AB

## 2016-09-13 ENCOUNTER — Ambulatory Visit (HOSPITAL_COMMUNITY)
Admission: RE | Admit: 2016-09-13 | Discharge: 2016-09-13 | Disposition: A | Discharge status: Home / Self Care | Payer: Medicaid Other | Source: Ambulatory Visit | Attending: Orthopedic Surgery | Admitting: Orthopedic Surgery

## 2016-09-13 DIAGNOSIS — M47896 Other spondylosis, lumbar region: Secondary | ICD-10-CM | POA: Insufficient documentation

## 2016-09-13 DIAGNOSIS — M5441 Lumbago with sciatica, right side: Secondary | ICD-10-CM | POA: Diagnosis present

## 2016-09-13 DIAGNOSIS — M5442 Lumbago with sciatica, left side: Secondary | ICD-10-CM | POA: Insufficient documentation

## 2016-09-14 ENCOUNTER — Other Ambulatory Visit: Payer: Self-pay | Admitting: Adult Health

## 2016-09-15 ENCOUNTER — Encounter: Payer: Self-pay | Admitting: Advanced Practice Midwife

## 2016-09-17 ENCOUNTER — Encounter: Payer: Self-pay | Admitting: Obstetrics and Gynecology

## 2016-09-17 ENCOUNTER — Ambulatory Visit (INDEPENDENT_AMBULATORY_CARE_PROVIDER_SITE_OTHER): Payer: Medicaid Other | Admitting: Obstetrics and Gynecology

## 2016-09-17 VITALS — BP 116/68 | HR 80 | Ht 64.0 in | Wt 156.4 lb

## 2016-09-17 DIAGNOSIS — N8110 Cystocele, unspecified: Secondary | ICD-10-CM | POA: Diagnosis not present

## 2016-09-17 DIAGNOSIS — N8111 Cystocele, midline: Secondary | ICD-10-CM

## 2016-09-17 MED ORDER — ESTROGENS, CONJUGATED 0.625 MG/GM VA CREA: 1.0000 | TOPICAL_CREAM | VAGINAL | 2 refills | Status: DC

## 2016-09-17 NOTE — Patient Instructions (Signed)

## 2016-09-17 NOTE — Progress Notes (Signed)
Preoperative History and Physical  [name] is a 61 y.o. G1P0 here for surgical management of bladder decensus.  She reports occasional incontinence and occasional decreased urination. No significant new preoperative concerns.  Proposed surgery: Anterior and posterior repair  Past Medical History:  Diagnosis Date  . Arthritis    spinal stenosis  . Back disorder   . Bulging lumbar disc   . Depression   . H/O degenerative disc disease   . Hypertension   . Hypothyroidism   . Thyroid disease    Past Surgical History:  Procedure Laterality Date  . CATARACT EXTRACTION W/PHACO Right 07/05/2016   Procedure: CATARACT EXTRACTION PHACO AND INTRAOCULAR LENS PLACEMENT (IOC);  Surgeon: Tonny Branch, MD;  Location: [facility];  Service: Ophthalmology;  Laterality: Right;  CDE: 15.41  . CATARACT EXTRACTION W/PHACO Left 07/19/2016   Procedure: CATARACT EXTRACTION PHACO AND INTRAOCULAR LENS PLACEMENT LEFT EYE CDE= 12.65;  Surgeon: Tonny Branch, MD;  Location: [facility];  Service: Ophthalmology;  Laterality: Left;  left  . TUBAL LIGATION     OB History  Gravida Para Term Preterm AB Living  1         1  SAB TAB Ectopic Multiple Live Births               # Outcome Date GA Lbr Len/2nd Weight Sex Delivery Anes PTL Lv  1 Gravida     F Vag-Forceps       Patient denies any other pertinent gynecologic issues.   Current Outpatient Prescriptions on File Prior to Visit  Medication Sig Dispense Refill  . alendronate (FOSAMAX) 70 MG tablet Take 70 mg by mouth once a week. Take with a full glass of water on an empty stomach. sundays    . Cholecalciferol (VITAMIN D3) 1000 units CAPS Take 1,000 Units by mouth daily.    . COD LIVER OIL PO Take 415 mg by mouth daily.    Marland Kitchen lisinopril (PRINIVIL,ZESTRIL) 20 MG tablet Take 20 mg by mouth daily.    Marland Kitchen lovastatin (MEVACOR) 40 MG tablet Take 40 mg by mouth at bedtime.    . methimazole (TAPAZOLE) 5 MG tablet Take 5 mg by mouth 2 (two) times daily.    Marland Kitchen PARoxetine (PAXIL)  40 MG tablet Take 40 mg by mouth daily.    . propranolol ER (INDERAL LA) 60 MG 24 hr capsule Take 60 mg by mouth 2 (two) times daily.      No current facility-administered medications on file prior to visit.    Allergies  Allergen Reactions  . Prednisone     Sick to stomach  . Zithromax [Azithromycin] Other (See Comments)    Blisters in mouth     Social History:   reports that she has been smoking Cigarettes.  She has a 66.00 pack-year smoking history. She has never used smokeless tobacco. She reports that she does not drink alcohol or use drugs.  Family History  Problem Relation Age of Onset  . Hypertension Mother   . Congenital heart disease Mother   . Diabetes Mother   . Thyroid disease Brother     Review of Systems: Patient smokes less than one pack per day. Denies coughing  PHYSICAL EXAM: There were no vitals taken for this visit. General appearance - alert, well appearing, and in no distress Chest - clear to auscultation, no wheezes, rales or rhonchi, symmetric air entry Heart - normal rate and regular rhythm Abdomen - soft, nontender, nondistended, no masses or organomegaly Pelvic -  Large cystocele; no rotation of urethra with cough Back wall small rectocele. Dencet posterior support and mild relaxation  Extremities - peripheral pulses normal, no pedal edema, no clubbing or cyanosis  Labs: Results for orders placed or performed in visit on 09/08/16 (from the past 336 hour(s))  Cytology - PAP   Collection Time: 09/08/16 12:00 AM  Result Value Ref Range   Adequacy (A)     Satisfactory for evaluation. The presence or absence of an endocervical / transformation zone component cannot be determined because of atrophy.   Diagnosis (A)     ATYPICAL SQUAMOUS CELLS CANNOT EXCLUDE A HIGH GRADE SQUAMOUS INTRAEPITHELIAL LESION (ASC-H).   HPV DETECTED (A)    Material Submitted CervicoVaginal Pap [ThinPrep Imaged] (A)    CYTOLOGY - PAP PAP RESULT     Imaging Studies: Mr  Lumbar Spine Wo Contrast  Result Date: 09/13/2016 CLINICAL DATA:  Low back pain radiating down the left leg for 2 years EXAM: MRI LUMBAR SPINE WITHOUT CONTRAST TECHNIQUE: Multiplanar, multisequence MR imaging of the lumbar spine was performed. No intravenous contrast was administered. COMPARISON:  06/16/2014 FINDINGS: Segmentation:  Standard. Alignment:  Physiologic. Vertebrae:  No fracture, evidence of discitis, or bone lesion. Conus medullaris: Extends to the L1 level and appears normal. Paraspinal and other soft tissues: Negative. Disc levels: Disc spaces: Degenerative disc disease with disc height loss throughout the lumbar spine most severe at L5-S1. T12-L1: Mild broad-based disc bulge. No evidence of neural foraminal stenosis. No central canal stenosis. L1-L2: Mild broad-based disc bulge. No evidence of neural foraminal stenosis. No central canal stenosis. L2-L3: Mild broad-based disc bulge flattening the ventral thecal sac. Mild bilateral facet arthropathy. No evidence of neural foraminal stenosis. No central canal stenosis. L3-L4: Mild broad-based disc bulge flattening the ventral thecal sac. Mild bilateral facet arthropathy. Bilateral lateral recess narrowing. No evidence of neural foraminal stenosis. No central canal stenosis. L4-L5: Broad-based disc bulge. Mild bilateral facet arthropathy. Bilateral lateral recess narrowing. No evidence of neural foraminal stenosis. No central canal stenosis. L5-S1: Broad-based disc osteophyte complex eccentric towards the right. Mild bilateral foraminal stenosis. No central canal stenosis. IMPRESSION: 1. Diffuse lumbar spine spondylosis as described above. Electronically Signed   By: Kathreen Devoid   On: 09/13/2016 18:47    Assessment: Patient Active Problem List   Diagnosis Date Noted  . Moderate episode of recurrent major depressive disorder ([place]) 01/06/2016    Plan: Patient will undergo surgical management with anterior and posterior repair. Pt to use  Premarin vaginal cream x 4 weeks and return in 4 weeks for pre-op recheck. Surgery in 6 weeks.   By signing my name below, I, Evelene Croon, attest that this documentation has been prepared under the direction and in the presence of Jonnie Kind, MD . Electronically Signed: Evelene Croon, Scribe. 09/17/2016. 1:01 PM. I personally performed the services described in this documentation, which was SCRIBED in my presence. The recorded information has been reviewed and considered accurate. It has been edited as necessary during review. Jonnie Kind, MD

## 2016-09-22 ENCOUNTER — Telehealth: Payer: Self-pay | Admitting: Obstetrics and Gynecology

## 2016-09-22 NOTE — Telephone Encounter (Signed)
Informed patient that she will need a colposcopy before her surgery and to stop taking Premarin cream 3-4 days before appointment. Verbalized understanding.

## 2016-09-24 ENCOUNTER — Telehealth: Payer: Self-pay | Admitting: *Deleted

## 2016-09-24 NOTE — Telephone Encounter (Signed)
Patient called with questions regarding dosage of Premarin cream. Explained to patient that it was 3.5C (1/4 applicator). Talked patient through putting medicine in applicator.Verbalized understanding.

## 2016-10-06 ENCOUNTER — Ambulatory Visit (INDEPENDENT_AMBULATORY_CARE_PROVIDER_SITE_OTHER): Payer: Medicaid Other | Admitting: Obstetrics and Gynecology

## 2016-10-06 ENCOUNTER — Other Ambulatory Visit: Payer: Self-pay | Admitting: Obstetrics and Gynecology

## 2016-10-06 ENCOUNTER — Encounter: Payer: Self-pay | Admitting: Obstetrics and Gynecology

## 2016-10-06 VITALS — BP 98/70 | HR 72 | Wt 158.4 lb

## 2016-10-06 DIAGNOSIS — R87611 Atypical squamous cells cannot exclude high grade squamous intraepithelial lesion on cytologic smear of cervix (ASC-H): Secondary | ICD-10-CM | POA: Diagnosis not present

## 2016-10-06 NOTE — Progress Notes (Signed)
  [name] 61 y.o. G1P0 here for colposcopy for ATYPICAL SQUAMOUS CELLS CANNOT EXCLUDE A HIGH GRADE SQUAMOUS INTRAEPITHELIAL LESION (ASC-H). pap smear on 09/08/16  Discussed role for HPV in cervical dysplasia, need for surveillance.  Patient given informed consent, signed copy in the chart, time out was performed.  Placed in lithotomy position. Cervix viewed with speculum and colposcope after application of acetic acid.   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 No No visible lesions, cervix is normal; biopsies obtained at the 3 o'clock and 9 o'clock position.   ECC specimen obtained. All specimens were labelled and sent to pathology.   Colposcopy IMPRESSION: Atrophic vaginal tissue, rule out endocervical lesion. No visual dysplasia, random biopsy done at 3 o'clock and 9 o'clock. ECC done  Patient was given post procedure instructions. Will follow up pathology in one week and manage accordingly.  Routine preventative health maintenance measures emphasized.    By signing my name below, I, Sonum Patel, attest that this documentation has been prepared under the direction and in the presence of Jonnie Kind, MD. Electronically Signed: Sonum Patel, Scribe. 10/06/16. 3:41 PM.  I personally performed the services described in this documentation, which was SCRIBED in my presence. The recorded information has been reviewed and considered accurate. It has been edited as necessary during review. Jonnie Kind, MD

## 2016-10-12 ENCOUNTER — Telehealth: Payer: Self-pay | Admitting: *Deleted

## 2016-10-12 NOTE — Telephone Encounter (Signed)
Informed patient of Colposcopic results per Dr Glo Herring: Random biopsies of the exocervix are negative. 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 Will have patient informed and asked her to follow-up for discussion of next step in treatment. Patient states she has appointment on Friday so will discuss then.

## 2016-10-15 ENCOUNTER — Encounter: Payer: Self-pay | Admitting: Obstetrics and Gynecology

## 2016-10-15 ENCOUNTER — Ambulatory Visit (INDEPENDENT_AMBULATORY_CARE_PROVIDER_SITE_OTHER): Payer: Medicaid Other | Admitting: Obstetrics and Gynecology

## 2016-10-15 DIAGNOSIS — Z01818 Encounter for other preprocedural examination: Secondary | ICD-10-CM

## 2016-10-15 DIAGNOSIS — N816 Rectocele: Secondary | ICD-10-CM | POA: Diagnosis not present

## 2016-10-15 DIAGNOSIS — N871 Moderate cervical dysplasia: Secondary | ICD-10-CM

## 2016-10-15 DIAGNOSIS — N8110 Cystocele, unspecified: Secondary | ICD-10-CM

## 2016-10-15 NOTE — Patient Instructions (Signed)
About Cystocele  Overview  The pelvic organs, including the bladder, are normally supported by pelvic floor muscles and ligaments.  When these muscles and ligaments are stretched, weakened or torn, the wall between the bladder and the vagina sags or herniates causing a prolapse, sometimes called a cystocele.  This condition may cause discomfort and problems with emptying the bladder.  It can be present in various stages.  Some people are not aware of the changes.  Others may notice changes at the vaginal opening or a feeling of the bladder dropping outside the body.  Causes of a Cystocele  A cystocele is usually caused by muscle straining or stretching during childbirth.  In addition, cystocele is more common after menopause, because the hormone estrogen helps keep the elastic tissues around the pelvic organs strong.  A cystocele is more likely to occur when levels of estrogen decrease.  Other causes include: heavy lifting, chronic coughing, previous pelvic surgery and obesity.  Symptoms  A bladder that has dropped from its normal position may cause: unwanted urine leakage (stress incontinence), frequent urination or urge to urinate, incomplete emptying of the bladder (not feeling bladder relief after emptying), pain or discomfort in the vagina, pelvis, groin, lower back or lower abdomen and frequent urinary tract infections.  Mild cases may not cause any symptoms.  Treatment Options  Pelvic floor (Kegel) exercises:  Strength training the muscles in your genital area  Behavioral changes: Treating and preventing constipation, taking time to empty your bladder properly, learning to lift properly and/or avoid heavy lifting when possible, stopping smoking, avoiding weight gain and treating a chronic cough or bronchitis.  A pessary: A vaginal support device is sometimes used to help pelvic support caused by muscle and ligament changes.  Surgery: Surgical repair may be necessary if symptoms cannot  be managed with exercise, behavioral changes and a pessary.  Surgery is usually considered for severe cases.   2007, [employer] Anterior and Posterior Colporrhaphy, Care After Refer to this sheet in the next few weeks. These instructions provide you with information on caring for yourself after your procedure. Your health care provider may also give you more specific instructions. Your treatment has been planned according to current medical practices, but problems sometimes occur. Call your health care provider if you have any problems or questions after your procedure. Follow these instructions at home:  Take frequent rest periods throughout the day.  Only take over-the-counter or prescription medicines as directed by your health care provider.  Avoid strenuous activity such as heavy lifting (more than 10 pounds [4.5 kg]), pushing, and pulling until your health care provider says it is okay.  Take showers if your health care provider approves. Pat incisions dry. Do not rub incisions with a washcloth or towel. Do not take tub baths until your health care provider approves.  Wear compression stockings as directed by your health care provider. These stockings help prevent blood clots from forming in your legs.  Talk with your health care provider about when you may return to work and your exercise routine.  Do not drive until your health care provider approves.  You may resume your normal diet. Eat a well-balanced diet.  Drink enough fluids to keep your urine clear or pale yellow.  Your normal bowel function should return. If you become constipated, you may: ? Take a mild laxative. ? Add fruit and bran to your diet. ? Drink more fluids.  Do not have sexual intercourse until permitted by your health care  provider.  Follow up with your health care provider as directed. Contact a health care provider if: You have persistent nausea or vomiting. Get help right away  if:  You have increased bleeding (more than a small spot) from the vaginal area.  Your pain is not relieved with medicine or becomes worse.  You have redness, swelling, or increasing pain in the vaginal area.  You have abdominal pain.  You see pus coming from the wounds.  You develop a fever.  You have a foul smell coming from your vaginal area.  You develop light-headedness or you feel faint.  You have difficulty breathing. This information is not intended to replace advice given to you by your health care provider. Make sure you discuss any questions you have with your health care provider. Document Released: 10/15/2004 Document Revised: 09/04/2015 Document Reviewed: 08/18/2012 Elsevier Interactive Patient Education  2017 [employer].

## 2016-10-15 NOTE — Progress Notes (Addendum)
Preoperative History and Physical  [name] is a 61 y.o. 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 No significant preoperative concerns. The patient also has a large bulging cystocele and small rectocele. See last visits note  Proposed surgery: vaginal hysterectomy with anterior and posterior repair  Past Medical History:  Diagnosis Date  . Arthritis    spinal stenosis  . Back disorder   . Bulging lumbar disc   . Depression   . H/O degenerative disc disease   . Hypertension   . Hypothyroidism   . Thyroid disease    Past Surgical History:  Procedure Laterality Date  . CATARACT EXTRACTION W/PHACO Right 07/05/2016   Procedure: CATARACT EXTRACTION PHACO AND INTRAOCULAR LENS PLACEMENT (IOC);  Surgeon: Tonny Branch, MD;  Location: [facility];  Service: Ophthalmology;  Laterality: Right;  CDE: 15.41  . CATARACT EXTRACTION W/PHACO Left 07/19/2016   Procedure: CATARACT EXTRACTION PHACO AND INTRAOCULAR LENS PLACEMENT LEFT EYE CDE= 12.65;  Surgeon: Tonny Branch, MD;  Location: [facility];  Service: Ophthalmology;  Laterality: Left;  left  . TUBAL LIGATION     OB History  Gravida Para Term Preterm AB Living  1 1 1     1   SAB TAB Ectopic Multiple Live Births          1    # Outcome Date GA Lbr Len/2nd Weight Sex Delivery Anes PTL Lv  1 Term     F Vag-Forceps   LIV    Patient denies any other pertinent gynecologic issues.   Current Outpatient Prescriptions on File Prior to Visit  Medication Sig Dispense Refill  . alendronate (FOSAMAX) 70 MG tablet Take 70 mg by mouth once a week. Take with a full glass of water on an empty stomach. sundays    . Cholecalciferol (VITAMIN D3) 1000 units CAPS Take 2,000 Units by mouth daily.     Marland Kitchen conjugated estrogens (PREMARIN) vaginal cream Place 1 Applicatorful vaginally 3 (three) times a week. For vaginal thinning and irritation 0.5 gram= 1/4 applicator 93.7 g 2  .  HYDROcodone-acetaminophen (NORCO/VICODIN) 5-325 MG tablet Take 1 tablet by mouth every 6 (six) hours as needed for moderate pain.    Marland Kitchen lisinopril (PRINIVIL,ZESTRIL) 20 MG tablet Take 20 mg by mouth daily.    Marland Kitchen lovastatin (MEVACOR) 40 MG tablet Take 40 mg by mouth at bedtime.    . methimazole (TAPAZOLE) 5 MG tablet Take 5 mg by mouth 2 (two) times daily.    Marland Kitchen PARoxetine (PAXIL) 40 MG tablet Take 40 mg by mouth daily.    . propranolol ER (INDERAL LA) 60 MG 24 hr capsule Take 60 mg by mouth 2 (two) times daily.     . traZODone (DESYREL) 50 MG tablet Take 100 mg by mouth at bedtime as needed.      No current facility-administered medications on file prior to visit.    Allergies  Allergen Reactions  . Prednisone     Sick to stomach  . Zithromax [Azithromycin] Other (See Comments)    Blisters in mouth     Social History:   reports that she has been smoking Cigarettes.  She has a 44.00 pack-year smoking history. She has never used smokeless tobacco. She reports that she does not drink alcohol or use drugs.  Family History  Problem Relation Age of Onset  . Hypertension Mother   . Congenital heart disease Mother   . Diabetes Mother   .  Thyroid disease Brother   . Other Daughter        bowel issues    Review of Systems: Noncontributory  PHYSICAL EXAM: Blood pressure 136/80, pulse 76, height 5\' 4"  (1.626 m), weight 161 lb 6.4 oz (73.2 kg). General appearance - alert, well appearing, and in no distress Chest - clear to auscultation, no wheezes, rales or rhonchi, symmetric air entry Heart - normal rate and regular rhythm Abdomen - soft, nontender, nondistended, no masses or organomegaly Pelvic exam:  VULVA: normal appearing vulva with no masses, tenderness or lesions, VAGINA: normal appearing vagina with normal color and discharge, no lesions,  CERVIX: normal appearing cervix without discharge or lesions,Healing from cervix biopsies at 3:00 and 9:00  UTERUS: uterus is small, normal size,  shape, consistency and nontender, Large 1st degree cystocele, small rectocele.  ADNEXA: normal adnexa in size, nontender and no masses,  Extremities - peripheral pulses normal, no pedal edema, no clubbing or cyanosis  Discussion: 1. Discussed with pt risks and benefits of vaginal hysterectomy with anterior and posterior repair   At end of discussion, pt had opportunity to ask questions and has no further questions at this time.   Specific discussion of vaginal hysterectomy with anterior and posterior repair as noted above. Greater than 50% was spent in counseling and coordination of care with the patient.   Total time greater than: 25 minutes.   Labs: No results found for this or any previous visit (from the past 336 hour(s)).  Imaging Studies: No results found.  Assessment: 1. ECC with CIN 2 abnormality 2. Large 1st degree cystocele 3. Rectocele  Patient Active Problem List   Diagnosis Date Noted  . Moderate episode of recurrent major depressive disorder ([place]) 01/06/2016    Plan: 1. GC/Chlamydia probe collected in office today 2. Patient will undergo surgical management with vaginal hysterectomy , possible bilateral salpingoophorectomy, with anterior and posterior repair.    Jonnie Kind, MD 10/15/2016 12:56 PM  By signing my name below, I, Soijett Blue, attest that this documentation has been prepared under the direction and in the presence of Jonnie Kind, MD. Electronically Signed: Soijett Blue, ED Scribe. 10/15/16. 12:56 PM.   I personally performed the services described in this documentation, which was SCRIBED in my presence. The recorded information has been reviewed and considered accurate. It has been edited as necessary during review. Jonnie Kind, MD

## 2016-10-17 LAB — GC/CHLAMYDIA PROBE AMP
Chlamydia trachomatis, NAA: NEGATIVE
NEISSERIA GONORRHOEAE BY PCR: NEGATIVE

## 2016-10-18 LAB — TSH: TSH: 15.06 — AB (ref <=5.90)

## 2016-10-19 ENCOUNTER — Telehealth: Payer: Self-pay | Admitting: Obstetrics and Gynecology

## 2016-10-21 ENCOUNTER — Encounter: Payer: Self-pay | Admitting: *Deleted

## 2016-10-26 NOTE — Telephone Encounter (Signed)
Confirmed with the patient on today's phone call that she is ready for vaginal hysterectomy anterior and posterior repair on 11/09/2016

## 2016-11-02 NOTE — Patient Instructions (Signed)
[name]  11/02/2016     @PREFPERIOPPHARMACY @   Your procedure is scheduled on  11/09/2016   Report to [facility] at   615  A.M.  Call this number if you have problems the morning of surgery:  [phone]   Remember:  Do not eat food or drink liquids after midnight.  Take these medicines the morning of surgery with A SIP OF WATER  Amlodipine, hydrocodone, lisinopril, tapazole, paxil, propranolol ER.   Do not wear jewelry, make-up or nail polish.  Do not wear lotions, powders, or perfumes, or deoderant.  Do not shave 48 hours prior to surgery.  Men may shave face and neck.  Do not bring valuables to the hospital.  [facility] is not responsible for any belongings or valuables.  Contacts, dentures or bridgework may not be worn into surgery.  Leave your suitcase in the car.  After surgery it may be brought to your room.  For patients admitted to the hospital, discharge time will be determined by your treatment team.  Patients discharged the day of surgery will not be allowed to drive home.   Name and phone number of your driver:   family Special instructions:  Follow the diet and prep instructions enclosed.  Please read over the following fact sheets that you were given. Pain Booklet, Coughing and Deep Breathing, Blood Transfusion Information, Lab Information, MRSA Information, Surgical Site Infection Prevention, Anesthesia Post-op Instructions and Care and Recovery After Surgery       Anterior and Posterior Colporrhaphy Anterior or posterior colporrhaphy is surgery to fix a prolapse of organs in the genital tract. Prolapse means the falling down, bulging, dropping, or drooping of an organ. Organs that commonly prolapse include the rectum, bladder, vagina, and uterus. Prolapse can affect a single organ or several organs at the same time. This often worsens when women stop having their monthly periods (menopause) because estrogen loss weakens the  muscles and tissues in the genital tract. In addition, prolapse happens when the organs are damaged or weakened. This commonly happens after childbirth and as a result of aging. Surgery is often done for severe prolapses. The type of colporrhaphy done depends on the type of genital prolapse. Types of genital prolapse include the following:  Cystocele. This is a prolapse of the upper (anterior) wall of the vagina. The anterior wall bulges into the vagina and brings the bladder with it.  Rectocele. This is a prolapse of the lower (posterior) wall of the vagina. The posterior vaginal wall bulges into the vagina and brings the rectum with it.  Enterocele. This is a prolapse of part of the pelvic organs called the pouch of Douglas. It also involves a portion of the small bowel. It appears as a bulge under the neck of the uterus at the top of the back wall of the vagina.  Procidentia. This is a complete prolapse of the uterus and the cervix. The prolapse can be seen and felt coming out of the vagina.  LET [facility] CARE PROVIDER KNOW ABOUT:  Any allergies you have.  All medicines you are taking, including vitamins, herbs, eye drops, creams, and over-the-counter medicines.  Previous problems you or members of your family have had with the use of anesthetics.  Any blood disorders you have.  Previous surgeries you have had.  Medical conditions you have.  Smoking history or history of alcohol use.  Possibility of pregnancy, if  this applies. RISKS AND COMPLICATIONS Generally, anterior or posterior colporrhaphy is a safe procedure. However, as with any procedure, complications can occur. Possible complications include:  Infection.  Damage to other organs during surgery.  Bleeding after surgery.  Problems urinating.  Problems from the anesthetic.  BEFORE THE PROCEDURE  Ask your health care provider about changing or stopping your regular medicines.  Do not eat or drink anything for  at least 8 hours before the surgery.  If you smoke, do not smoke for at least 2 weeks before the surgery.  Make plans to have someone drive you home after your hospital stay. Also, arrange for someone to help you with activities during recovery. PROCEDURE You may be given medicine to help you relax before the surgery (sedative). During the surgery you will be given medicine to make you sleep through the procedure (general anesthetic) or medicine to numb you from the waist down (spinal anesthetic). This medicine will be given through an intravenous (IV) access tube that is put into one of your veins. The procedure will vary depending on the type of repair:  Anterior repair. A cut (incision) is made in the midline section of the front part of the vaginal wall. A triangular-shaped piece of vaginal tissue is removed, and the stronger, healthier tissue is sewn together in order to support and suspend the bladder.  Posterior repair. An incision is made midline on the back wall of the vagina. A triangular portion of vaginal skin is removed to expose the muscle. Excess tissue is removed, and stronger, healthier muscle and ligament tissue is sewn together to support the rectum.  Anterior and posterior repair. Both procedures are done during the same surgery.  What to expect after the procedure You will be taken to a recovery area. Your blood pressure, pulse, breathing, and temperature (vital signs) will be monitored. This is done until you are stable. Then you will be transferred to a hospital room. After surgery, you will have a small rubber tube in place to drain your bladder (urinary catheter). This will be in place for 2 to 7 days or until your bladder is working properly on its own. The IV access tube will be removed in 1 to 3 days. You may have a gauze packing in your vagina to prevent bleeding. This will be removed 2 or 3 days after the surgery. You will likely need to stay in the hospital for 3 to 5  days. This information is not intended to replace advice given to you by your health care provider. Make sure you discuss any questions you have with your health care provider. Document Released: 06/19/2003 Document Revised: 09/04/2015 Document Reviewed: 08/18/2012 Elsevier Interactive Patient Education  2017 [place]. Anterior and Posterior Colporrhaphy, Care After Refer to this sheet in the next few weeks. These instructions provide you with information on caring for yourself after your procedure. Your health care provider may also give you more specific instructions. Your treatment has been planned according to current medical practices, but problems sometimes occur. Call your health care provider if you have any problems or questions after your procedure. Follow these instructions at home:  Take frequent rest periods throughout the day.  Only take over-the-counter or prescription medicines as directed by your health care provider.  Avoid strenuous activity such as heavy lifting (more than 10 pounds [4.5 kg]), pushing, and pulling until your health care provider says it is okay.  Take showers if your health care provider approves. Pat incisions dry.  Do not rub incisions with a washcloth or towel. Do not take tub baths until your health care provider approves.  Wear compression stockings as directed by your health care provider. These stockings help prevent blood clots from forming in your legs.  Talk with your health care provider about when you may return to work and your exercise routine.  Do not drive until your health care provider approves.  You may resume your normal diet. Eat a well-balanced diet.  Drink enough fluids to keep your urine clear or pale yellow.  Your normal bowel function should return. If you become constipated, you may: ? Take a mild laxative. ? Add fruit and bran to your diet. ? Drink more fluids.  Do not have sexual intercourse until permitted by your  health care provider.  Follow up with your health care provider as directed. Contact a health care provider if: You have persistent nausea or vomiting. Get help right away if:  You have increased bleeding (more than a small spot) from the vaginal area.  Your pain is not relieved with medicine or becomes worse.  You have redness, swelling, or increasing pain in the vaginal area.  You have abdominal pain.  You see pus coming from the wounds.  You develop a fever.  You have a foul smell coming from your vaginal area.  You develop light-headedness or you feel faint.  You have difficulty breathing. This information is not intended to replace advice given to you by your health care provider. Make sure you discuss any questions you have with your health care provider. Document Released: 10/15/2004 Document Revised: 09/04/2015 Document Reviewed: 08/18/2012 Elsevier Interactive Patient Education  2017 [employer]  Vaginal Hysterectomy A vaginal hysterectomy is a procedure to remove all or part of the uterus through a small incision in the vagina. In this procedure, your health care provider may remove your entire uterus, including the lower end (cervix). You may need a vaginal hysterectomy to treat:  Uterine fibroids.  A condition that causes the lining of the uterus to grow in other areas (endometriosis).  Problems with pelvic support.  Cancer of the cervix, ovaries, uterus, or tissue that lines the uterus (endometrium).  Excessive (dysfunctional) uterine bleeding.  When removing your uterus, your health care provider may also remove the organs that produce eggs (ovaries) and the tubes that carry eggs to your uterus (fallopian tubes). After a vaginal hysterectomy, you will no longer be able to have a baby. You will also no longer get your menstrual period. Tell a health care provider about:  Any allergies you have.  All medicines you are taking, including vitamins, herbs,  eye drops, creams, and over-the-counter medicines.  Any problems you or family members have had with anesthetic medicines.  Any blood disorders you have.  Any surgeries you have had.  Any medical conditions you have.  Whether you are pregnant or may be pregnant. What are the risks? Generally, this is a safe procedure. However, problems may occur, including:  Bleeding.  Infection.  A blood clot that forms in your leg and travels to your lungs (pulmonary embolism).  Damage to surrounding organs.  Pain during sex.  What happens before the procedure?  Ask your health care provider what organs will be removed during surgery.  Ask your health care provider about: ? Changing or stopping your regular medicines. This is especially important if you are taking diabetes medicines or blood thinners. ? Taking medicines such as aspirin and ibuprofen. These medicines can  thin your blood. Do not take these medicines before your procedure if your health care provider instructs you not to.  Follow instructions from your health care provider about eating or drinking restrictions.  Do not use any tobacco products, such as cigarettes, chewing tobacco, and e-cigarettes. If you need help quitting, ask your health care provider.  Plan to have someone take you home after discharge from the hospital. What happens during the procedure?  To reduce your risk of infection: ? Your health care team will wash or sanitize their hands. ? Your skin will be washed with soap.  An IV tube will be inserted into one of your veins.  You may be given antibiotic medicine to help prevent infection.  You will be given one or more of the following: ? A medicine to help you relax (sedative). ? A medicine to numb the area (local anesthetic). ? A medicine to make you fall asleep (general anesthetic). ? A medicine that is injected into an area of your body to numb everything beyond the injection site (regional  anesthetic).  Your surgeon will make an incision in your vagina.  Your surgeon will locate and remove all or part of your uterus.  Your ovaries and fallopian tubes may be removed at the same time.  The incision will be closed with stitches (sutures) that dissolve over time. The procedure may vary among health care providers and hospitals. What happens after the procedure?  Your blood pressure, heart rate, breathing rate, and blood oxygen level will be monitored often until the medicines you were given have worn off.  You will be encouraged to get up and walk around after a few hours to help prevent complications.  You may have IV tubes in place for a few days.  You will be given pain medicine as needed.  Do not drive for 24 hours if you were given a sedative. This information is not intended to replace advice given to you by your health care provider. Make sure you discuss any questions you have with your health care provider. Document Released: 07/21/2015 Document Revised: 09/04/2015 Document Reviewed: 04/13/2015 Elsevier Interactive Patient Education  2018 [place].  Vaginal Hysterectomy, Care After Refer to this sheet in the next few weeks. These instructions provide you with information about caring for yourself after your procedure. Your health care provider may also give you more specific instructions. Your treatment has been planned according to current medical practices, but problems sometimes occur. Call your health care provider if you have any problems or questions after your procedure. What can I expect after the procedure? After the procedure, it is common to have:  Pain.  Soreness and numbness in your incision areas.  Vaginal bleeding and discharge.  Constipation.  Temporary problems emptying the bladder.  Feelings of sadness or other emotions.  Follow these instructions at home: Medicines  Take over-the-counter and prescription medicines only as told  by your health care provider.  If you were prescribed an antibiotic medicine, take it as told by your health care provider. Do not stop taking the antibiotic even if you start to feel better.  Do not drive or operate heavy machinery while taking prescription pain medicine. Activity  Return to your normal activities as told by your health care provider. Ask your health care provider what activities are safe for you.  Get regular exercise as told by your health care provider. You may be told to take short walks every day and go farther each time.  Do not lift anything that is heavier than 10 lb (4.5 kg). General instructions   Do not put anything in your vagina for 6 weeks after your surgery or as told by your health care provider. This includes tampons and douches.  Do not have sex until your health care provider says you can.  Do not take baths, swim, or use a hot tub until your health care provider approves.  Drink enough fluid to keep your urine clear or pale yellow.  Do not drive for 24 hours if you were given a sedative.  Keep all follow-up visits as told by your health care provider. This is important. Contact a health care provider if:  Your pain medicine is not helping.  You have a fever.  You have redness, swelling, or pain at your incision site.  You have blood, pus, or a bad-smelling discharge from your vagina.  You continue to have difficulty urinating. Get help right away if:  You have severe abdominal or back pain.  You have heavy bleeding from your vagina.  You have chest pain or shortness of breath. This information is not intended to replace advice given to you by your health care provider. Make sure you discuss any questions you have with your health care provider. Document Released: 07/21/2015 Document Revised: 09/04/2015 Document Reviewed: 04/13/2015 Elsevier Interactive Patient Education  2018 [place] Anesthesia, Adult General  anesthesia is the use of medicines to make a person "go to sleep" (be unconscious) for a medical procedure. General anesthesia is often recommended when a procedure:  Is long.  Requires you to be still or in an unusual position.  Is major and can cause you to lose blood.  Is impossible to do without general anesthesia.  The medicines used for general anesthesia are called general anesthetics. In addition to making you sleep, the medicines:  Prevent pain.  Control your blood pressure.  Relax your muscles.  Tell a health care provider about:  Any allergies you have.  All medicines you are taking, including vitamins, herbs, eye drops, creams, and over-the-counter medicines.  Any problems you or family members have had with anesthetic medicines.  Types of anesthetics you have had in the past.  Any bleeding disorders you have.  Any surgeries you have had.  Any medical conditions you have.  Any history of heart or lung conditions, such as heart failure, sleep apnea, or chronic obstructive pulmonary disease (COPD).  Whether you are pregnant or may be pregnant.  Whether you use tobacco, alcohol, marijuana, or street drugs.  Any history of Armed forces logistics/support/administrative officer.  Any history of depression or anxiety. What are the risks? Generally, this is a safe procedure. However, problems may occur, including:  Allergic reaction to anesthetics.  Lung and heart problems.  Inhaling food or liquids from your stomach into your lungs (aspiration).  Injury to nerves.  Waking up during your procedure and being unable to move (rare).  Extreme agitation or a state of mental confusion (delirium) when you wake up from the anesthetic.  Air in the bloodstream, which can lead to stroke.  These problems are more likely to develop if you are having a major surgery or if you have an advanced medical condition. You can prevent some of these complications by answering all of your health care provider's  questions thoroughly and by following all pre-procedure instructions. General anesthesia can cause side effects, including:  Nausea or vomiting  A sore throat from the breathing tube.  Feeling cold  or shivery.  Feeling tired, washed out, or achy.  Sleepiness or drowsiness.  Confusion or agitation.  What happens before the procedure? Staying hydrated Follow instructions from your health care provider about hydration, which may include:  Up to 2 hours before the procedure - you may continue to drink clear liquids, such as water, clear fruit juice, black coffee, and plain tea.  Eating and drinking restrictions Follow instructions from your health care provider about eating and drinking, which may include:  8 hours before the procedure - stop eating heavy meals or foods such as meat, fried foods, or fatty foods.  6 hours before the procedure - stop eating light meals or foods, such as toast or cereal.  6 hours before the procedure - stop drinking milk or drinks that contain milk.  2 hours before the procedure - stop drinking clear liquids.  Medicines  Ask your health care provider about: ? Changing or stopping your regular medicines. This is especially important if you are taking diabetes medicines or blood thinners. ? Taking medicines such as aspirin and ibuprofen. These medicines can thin your blood. Do not take these medicines before your procedure if your health care provider instructs you not to. ? Taking new dietary supplements or medicines. Do not take these during the week before your procedure unless your health care provider approves them.  If you are told to take a medicine or to continue taking a medicine on the day of the procedure, take the medicine with sips of water. General instructions   Ask if you will be going home the same day, the following day, or after a longer hospital stay. ? Plan to have someone take you home. ? Plan to have someone stay with you  for the first 24 hours after you leave the hospital or clinic.  For 3-6 weeks before the procedure, try not to use any tobacco products, such as cigarettes, chewing tobacco, and e-cigarettes.  You may brush your teeth on the morning of the procedure, but make sure to spit out the toothpaste. What happens during the procedure?  You will be given anesthetics through a mask and through an IV tube in one of your veins.  You may receive medicine to help you relax (sedative).  As soon as you are asleep, a breathing tube may be used to help you breathe.  An anesthesia specialist will stay with you throughout the procedure. He or she will help keep you comfortable and safe by continuing to give you medicines and adjusting the amount of medicine that you get. He or she will also watch your blood pressure, pulse, and oxygen levels to make sure that the anesthetics do not cause any problems.  If a breathing tube was used to help you breathe, it will be removed before you wake up. The procedure may vary among health care providers and hospitals. What happens after the procedure?  You will wake up, often slowly, after the procedure is complete, usually in a recovery area.  Your blood pressure, heart rate, breathing rate, and blood oxygen level will be monitored until the medicines you were given have worn off.  You may be given medicine to help you calm down if you feel anxious or agitated.  If you will be going home the same day, your health care provider may check to make sure you can stand, drink, and urinate.  Your health care providers will treat your pain and side effects before you go home.  Do not  drive for 24 hours if you received a sedative.  You may: ? Feel nauseous and vomit. ? Have a sore throat. ? Have mental slowness. ? Feel cold or shivery. ? Feel sleepy. ? Feel tired. ? Feel sore or achy, even in parts of your body where you did not have surgery. This information is not  intended to replace advice given to you by your health care provider. Make sure you discuss any questions you have with your health care provider. Document Released: 07/06/2007 Document Revised: 09/09/2015 Document Reviewed: 03/13/2015 Elsevier Interactive Patient Education  2018 [place] Anesthesia, Adult, Care After These instructions provide you with information about caring for yourself after your procedure. Your health care provider may also give you more specific instructions. Your treatment has been planned according to current medical practices, but problems sometimes occur. Call your health care provider if you have any problems or questions after your procedure. What can I expect after the procedure? After the procedure, it is common to have:  Vomiting.  A sore throat.  Mental slowness.  It is common to feel:  Nauseous.  Cold or shivery.  Sleepy.  Tired.  Sore or achy, even in parts of your body where you did not have surgery.  Follow these instructions at home: For at least 24 hours after the procedure:  Do not: ? Participate in activities where you could fall or become injured. ? Drive. ? Use heavy machinery. ? Drink alcohol. ? Take sleeping pills or medicines that cause drowsiness. ? Make important decisions or sign legal documents. ? Take care of children on your own.  Rest. Eating and drinking  If you vomit, drink water, juice, or soup when you can drink without vomiting.  Drink enough fluid to keep your urine clear or pale yellow.  Make sure you have little or no nausea before eating solid foods.  Follow the diet recommended by your health care provider. General instructions  Have a responsible adult stay with you until you are awake and alert.  Return to your normal activities as told by your health care provider. Ask your health care provider what activities are safe for you.  Take over-the-counter and prescription medicines only  as told by your health care provider.  If you smoke, do not smoke without supervision.  Keep all follow-up visits as told by your health care provider. This is important. Contact a health care provider if:  You continue to have nausea or vomiting at home, and medicines are not helpful.  You cannot drink fluids or start eating again.  You cannot urinate after 8-12 hours.  You develop a skin rash.  You have fever.  You have increasing redness at the site of your procedure. Get help right away if:  You have difficulty breathing.  You have chest pain.  You have unexpected bleeding.  You feel that you are having a life-threatening or urgent problem. This information is not intended to replace advice given to you by your health care provider. Make sure you discuss any questions you have with your health care provider. Document Released: 07/05/2000 Document Revised: 09/01/2015 Document Reviewed: 03/13/2015 Elsevier Interactive Patient Education  [employer].

## 2016-11-04 ENCOUNTER — Other Ambulatory Visit: Payer: Self-pay | Admitting: Obstetrics and Gynecology

## 2016-11-04 ENCOUNTER — Encounter (HOSPITAL_COMMUNITY)
Admission: RE | Admit: 2016-11-04 | Discharge: 2016-11-04 | Disposition: A | Discharge status: Home / Self Care | Payer: Medicaid Other | Source: Ambulatory Visit | Attending: Obstetrics and Gynecology | Admitting: Obstetrics and Gynecology

## 2016-11-04 ENCOUNTER — Encounter (HOSPITAL_COMMUNITY): Payer: Self-pay

## 2016-11-04 ENCOUNTER — Telehealth: Payer: Self-pay | Admitting: Obstetrics and Gynecology

## 2016-11-04 DIAGNOSIS — Z833 Family history of diabetes mellitus: Secondary | ICD-10-CM | POA: Diagnosis not present

## 2016-11-04 DIAGNOSIS — Z01812 Encounter for preprocedural laboratory examination: Secondary | ICD-10-CM | POA: Insufficient documentation

## 2016-11-04 DIAGNOSIS — Z8349 Family history of other endocrine, nutritional and metabolic diseases: Secondary | ICD-10-CM | POA: Diagnosis not present

## 2016-11-04 DIAGNOSIS — N871 Moderate cervical dysplasia: Secondary | ICD-10-CM | POA: Insufficient documentation

## 2016-11-04 DIAGNOSIS — N8110 Cystocele, unspecified: Secondary | ICD-10-CM | POA: Diagnosis not present

## 2016-11-04 DIAGNOSIS — Z01810 Encounter for preprocedural cardiovascular examination: Secondary | ICD-10-CM | POA: Insufficient documentation

## 2016-11-04 DIAGNOSIS — N816 Rectocele: Secondary | ICD-10-CM | POA: Insufficient documentation

## 2016-11-04 DIAGNOSIS — Z9889 Other specified postprocedural states: Secondary | ICD-10-CM | POA: Insufficient documentation

## 2016-11-04 DIAGNOSIS — Z79899 Other long term (current) drug therapy: Secondary | ICD-10-CM | POA: Diagnosis not present

## 2016-11-04 DIAGNOSIS — Z8249 Family history of ischemic heart disease and other diseases of the circulatory system: Secondary | ICD-10-CM | POA: Insufficient documentation

## 2016-11-04 LAB — CBC
HCT: 46.7 % — ABNORMAL HIGH (ref 36.0–46.0)
HEMOGLOBIN: 15.9 g/dL — AB (ref 12.0–15.0)
MCH: 30.2 pg (ref 26.0–34.0)
MCHC: 34 g/dL (ref 30.0–36.0)
MCV: 88.6 fL (ref 78.0–100.0)
Platelets: 266 10*3/uL (ref 150–400)
RBC: 5.27 MIL/uL — AB (ref 3.87–5.11)
RDW: 13 % (ref 11.5–15.5)
WBC: 9.6 10*3/uL (ref 4.0–10.5)

## 2016-11-04 LAB — URINALYSIS, ROUTINE W REFLEX MICROSCOPIC
Bacteria, UA: NONE SEEN
Bilirubin Urine: NEGATIVE
Glucose, UA: NEGATIVE mg/dL
Hgb urine dipstick: NEGATIVE
Ketones, ur: NEGATIVE mg/dL
Nitrite: NEGATIVE
PH: 5 (ref 5.0–8.0)
Protein, ur: NEGATIVE mg/dL
SPECIFIC GRAVITY, URINE: 1.016 (ref 1.005–1.030)

## 2016-11-04 LAB — BASIC METABOLIC PANEL
ANION GAP: 10 (ref 5–15)
BUN: 13 mg/dL (ref 6–20)
CHLORIDE: 102 mmol/L (ref 101–111)
CO2: 23 mmol/L (ref 22–32)
Calcium: 9.2 mg/dL (ref 8.9–10.3)
Creatinine, Ser: 0.84 mg/dL (ref 0.44–1.00)
GFR calc non Af Amer: >60 mL/min (ref >60)
Glucose, Bld: 113 mg/dL — ABNORMAL HIGH (ref 65–99)
POTASSIUM: 4.3 mmol/L (ref 3.5–5.1)
SODIUM: 135 mmol/L (ref 135–145)

## 2016-11-04 LAB — TYPE AND SCREEN
ABO/RH(D): A POS
Antibody Screen: NEGATIVE

## 2016-11-04 LAB — SURGICAL PCR SCREEN
MRSA, PCR: NEGATIVE
STAPHYLOCOCCUS AUREUS: NEGATIVE

## 2016-11-04 NOTE — Telephone Encounter (Signed)
Unable to contact pt by phone, number reached a fax machine, and then chronically busy signal. jvf  Will discuss whether pt wants [place] to consider BSO at time of hysterectomy at time of pt interview.

## 2016-11-08 NOTE — H&P (Addendum)
Preoperative History and Physical  CHIQUITA Schroeder is a 61 y.o. 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 No significant preoperative concerns. The patient also has a large bulging cystocele and small rectocele. See last visits note  Proposed surgery: vaginal hysterectomy with anterior and posterior repair, removal of tubes and ovaries      Past Medical History:  Diagnosis Date  . Arthritis    spinal stenosis  . Back disorder   . Bulging lumbar disc   . Depression   . H/O degenerative disc disease   . Hypertension   . Hypothyroidism   . Thyroid disease         Past Surgical History:  Procedure Laterality Date  . CATARACT EXTRACTION W/PHACO Right 07/05/2016   Procedure: CATARACT EXTRACTION PHACO AND INTRAOCULAR LENS PLACEMENT (IOC);  Surgeon: Tonny Branch, MD;  Location: [facility];  Service: Ophthalmology;  Laterality: Right;  CDE: 15.41  . CATARACT EXTRACTION W/PHACO Left 07/19/2016   Procedure: CATARACT EXTRACTION PHACO AND INTRAOCULAR LENS PLACEMENT LEFT EYE CDE= 12.65;  Surgeon: Tonny Branch, MD;  Location: [facility];  Service: Ophthalmology;  Laterality: Left;  left  . TUBAL LIGATION                     OB History  Gravida Para Term Preterm AB Living  1 1 1     1   SAB TAB Ectopic Multiple Live Births          1    # Outcome Date GA Lbr Len/2nd Weight Sex Delivery Anes PTL Lv  1 Term     F Vag-Forceps   LIV    Patient denies any other pertinent gynecologic issues.         Current Outpatient Prescriptions on File Prior to Visit  Medication Sig Dispense Refill  . alendronate (FOSAMAX) 70 MG tablet Take 70 mg by mouth once a week. Take with a full glass of water on an empty stomach. sundays    . Cholecalciferol (VITAMIN D3) 1000 units CAPS Take 2,000 Units by mouth daily.     Marland Kitchen conjugated estrogens (PREMARIN) vaginal cream Place 1 Applicatorful vaginally  3 (three) times a week. For vaginal thinning and irritation 0.5 gram= 1/4 applicator 46.5 g 2  . HYDROcodone-acetaminophen (NORCO/VICODIN) 5-325 MG tablet Take 1 tablet by mouth every 6 (six) hours as needed for moderate pain.    Marland Kitchen lisinopril (PRINIVIL,ZESTRIL) 20 MG tablet Take 20 mg by mouth daily.    Marland Kitchen lovastatin (MEVACOR) 40 MG tablet Take 40 mg by mouth at bedtime.    . methimazole (TAPAZOLE) 5 MG tablet Take 5 mg by mouth 2 (two) times daily.    Marland Kitchen PARoxetine (PAXIL) 40 MG tablet Take 40 mg by mouth daily.    . propranolol ER (INDERAL LA) 60 MG 24 hr capsule Take 60 mg by mouth 2 (two) times daily.     . traZODone (DESYREL) 50 MG tablet Take 100 mg by mouth at bedtime as needed.      No current facility-administered medications on file prior to visit.         Allergies  Allergen Reactions  . Prednisone     Sick to stomach  . Zithromax [Azithromycin] Other (See Comments)    Blisters in mouth     Social History:   reports that she has been smoking Cigarettes.  She has a 44.00 pack-year smoking history. She  has never used smokeless tobacco. She reports that she does not drink alcohol or use drugs.       Family History  Problem Relation Age of Onset  . Hypertension Mother   . Congenital heart disease Mother   . Diabetes Mother   . Thyroid disease Brother   . Other Daughter        bowel issues    Review of Systems: Noncontributory  PHYSICAL EXAM: Blood pressure 136/80, pulse 76, height 5\' 4"  (1.626 m), weight 161 lb 6.4 oz (73.2 kg). General appearance - alert, well appearing, and in no distress Chest - clear to auscultation, no wheezes, rales or rhonchi, symmetric air entry Heart - normal rate and regular rhythm Abdomen - soft, nontender, nondistended, no masses or organomegaly Pelvic exam:  VULVA: normal appearing vulva with no masses, tenderness or lesions, VAGINA: normal appearing vagina with normal color and discharge, no lesions,   CERVIX: normal appearing cervix without discharge or lesions,Healing from cervix biopsies at 3:00 and 9:00  UTERUS: uterus is small, normal size, shape, consistency and nontender, Large 1st degree cystocele, small rectocele.  ADNEXA: normal adnexa in size, nontender and no masses,  Extremities - peripheral pulses normal, no pedal edema, no clubbing or cyanosis  Discussion: 1. Discussed with pt risks and benefits of vaginal hysterectomy with anterior and posterior repair   At end of discussion, pt had opportunity to ask questions and has no further questions at this time.   Specific discussion of vaginal hysterectomy with anterior and posterior repair as noted above. Greater than 50% was spent in counseling and coordination of care with the patient.   Total time greater than: 25 minutes.   Labs: CBC    Component Value Date/Time   WBC 9.6 11/04/2016 1038   RBC 5.27 (H) 11/04/2016 1038   HGB 15.9 (H) 11/04/2016 1038   HCT 46.7 (H) 11/04/2016 1038   PLT 266 11/04/2016 1038   MCV 88.6 11/04/2016 1038   MCH 30.2 11/04/2016 1038   MCHC 34.0 11/04/2016 1038   RDW 13.0 11/04/2016 1038   LYMPHSABS 2.8 07/01/2016 1323   MONOABS 0.5 07/01/2016 1323   EOSABS 0.2 07/01/2016 1323   BASOSABS 0.1 07/01/2016 1323   CMP     Component Value Date/Time   NA 135 11/04/2016 1042   K 4.3 11/04/2016 1042   CL 102 11/04/2016 1042   CO2 23 11/04/2016 1042   GLUCOSE 113 (H) 11/04/2016 1042   BUN 13 11/04/2016 1042   CREATININE 0.84 11/04/2016 1042   CALCIUM 9.2 11/04/2016 1042   GFRNONAA >60 11/04/2016 1042   GFRAA >60 11/04/2016 1042     Imaging Studies:       Assessment: 1. ECC with CIN 2 abnormality 2. Large 1st degree cystocele 3. Rectocele      Patient Active Problem List   Diagnosis Date Noted  . Moderate episode of recurrent major depressive disorder ([place]) 01/06/2016    Plan: 1. GC/Chlamydia probe collected in office today 2. Patient will undergo surgical  management with vaginal hysterectomy , possible bilateral salpingoophorectomy, with anterior and posterior repair.    Jonnie Kind, MD 10/15/2016 12:56 PM

## 2016-11-09 ENCOUNTER — Encounter (HOSPITAL_COMMUNITY)
Admission: RE | Disposition: A | Discharge status: Home / Self Care | Payer: Self-pay | Source: Ambulatory Visit | Attending: Obstetrics and Gynecology

## 2016-11-09 ENCOUNTER — Observation Stay (HOSPITAL_COMMUNITY)
Admission: RE | Admit: 2016-11-09 | Discharge: 2016-11-10 | Disposition: A | Discharge status: Home / Self Care | Payer: Medicaid Other | Source: Ambulatory Visit | Attending: Obstetrics and Gynecology | Admitting: Obstetrics and Gynecology

## 2016-11-09 ENCOUNTER — Encounter (HOSPITAL_COMMUNITY): Payer: Self-pay

## 2016-11-09 ENCOUNTER — Inpatient Hospital Stay (HOSPITAL_COMMUNITY): Payer: Medicaid Other | Admitting: Anesthesiology

## 2016-11-09 DIAGNOSIS — Z833 Family history of diabetes mellitus: Secondary | ICD-10-CM | POA: Insufficient documentation

## 2016-11-09 DIAGNOSIS — I9581 Postprocedural hypotension: Secondary | ICD-10-CM | POA: Diagnosis not present

## 2016-11-09 DIAGNOSIS — N816 Rectocele: Secondary | ICD-10-CM | POA: Diagnosis present

## 2016-11-09 DIAGNOSIS — I10 Essential (primary) hypertension: Secondary | ICD-10-CM | POA: Diagnosis not present

## 2016-11-09 DIAGNOSIS — Z8249 Family history of ischemic heart disease and other diseases of the circulatory system: Secondary | ICD-10-CM | POA: Diagnosis not present

## 2016-11-09 DIAGNOSIS — Z8279 Family history of other congenital malformations, deformations and chromosomal abnormalities: Secondary | ICD-10-CM | POA: Diagnosis not present

## 2016-11-09 DIAGNOSIS — K141 Geographic tongue: Secondary | ICD-10-CM | POA: Insufficient documentation

## 2016-11-09 DIAGNOSIS — N814 Uterovaginal prolapse, unspecified: Secondary | ICD-10-CM | POA: Diagnosis not present

## 2016-11-09 DIAGNOSIS — F331 Major depressive disorder, recurrent, moderate: Secondary | ICD-10-CM

## 2016-11-09 DIAGNOSIS — Z9851 Tubal ligation status: Secondary | ICD-10-CM | POA: Diagnosis not present

## 2016-11-09 DIAGNOSIS — F329 Major depressive disorder, single episode, unspecified: Secondary | ICD-10-CM | POA: Diagnosis not present

## 2016-11-09 DIAGNOSIS — N8110 Cystocele, unspecified: Secondary | ICD-10-CM | POA: Diagnosis not present

## 2016-11-09 DIAGNOSIS — Z9841 Cataract extraction status, right eye: Secondary | ICD-10-CM | POA: Insufficient documentation

## 2016-11-09 DIAGNOSIS — Z888 Allergy status to other drugs, medicaments and biological substances status: Secondary | ICD-10-CM | POA: Insufficient documentation

## 2016-11-09 DIAGNOSIS — Z8379 Family history of other diseases of the digestive system: Secondary | ICD-10-CM | POA: Diagnosis not present

## 2016-11-09 DIAGNOSIS — Z961 Presence of intraocular lens: Secondary | ICD-10-CM | POA: Insufficient documentation

## 2016-11-09 DIAGNOSIS — N838 Other noninflammatory disorders of ovary, fallopian tube and broad ligament: Secondary | ICD-10-CM | POA: Insufficient documentation

## 2016-11-09 DIAGNOSIS — F17210 Nicotine dependence, cigarettes, uncomplicated: Secondary | ICD-10-CM | POA: Diagnosis not present

## 2016-11-09 DIAGNOSIS — Z8349 Family history of other endocrine, nutritional and metabolic diseases: Secondary | ICD-10-CM | POA: Insufficient documentation

## 2016-11-09 DIAGNOSIS — N840 Polyp of corpus uteri: Secondary | ICD-10-CM | POA: Insufficient documentation

## 2016-11-09 DIAGNOSIS — Z79899 Other long term (current) drug therapy: Secondary | ICD-10-CM | POA: Insufficient documentation

## 2016-11-09 DIAGNOSIS — N871 Moderate cervical dysplasia: Secondary | ICD-10-CM | POA: Diagnosis not present

## 2016-11-09 DIAGNOSIS — C539 Malignant neoplasm of cervix uteri, unspecified: Secondary | ICD-10-CM | POA: Diagnosis not present

## 2016-11-09 DIAGNOSIS — Z9842 Cataract extraction status, left eye: Secondary | ICD-10-CM | POA: Insufficient documentation

## 2016-11-09 DIAGNOSIS — E039 Hypothyroidism, unspecified: Secondary | ICD-10-CM | POA: Insufficient documentation

## 2016-11-09 DIAGNOSIS — Z90710 Acquired absence of both cervix and uterus: Secondary | ICD-10-CM | POA: Diagnosis not present

## 2016-11-09 HISTORY — PX: VAGINAL HYSTERECTOMY: SHX2639

## 2016-11-09 HISTORY — PX: ANTERIOR AND POSTERIOR REPAIR: SHX5121

## 2016-11-09 HISTORY — PX: BILATERAL SALPINGECTOMY: SHX5743

## 2016-11-09 HISTORY — PX: SALPINGOOPHORECTOMY: SHX82

## 2016-11-09 SURGERY — HYSTERECTOMY, VAGINAL
Anesthesia: General | Laterality: Right

## 2016-11-09 MED ORDER — EPHEDRINE SULFATE 50 MG/ML IJ SOLN
INTRAMUSCULAR | Status: AC
  Filled 2016-11-09: qty 1

## 2016-11-09 MED ORDER — GLYCOPYRROLATE 0.2 MG/ML IJ SOLN
INTRAMUSCULAR | Status: AC
  Filled 2016-11-09: qty 1

## 2016-11-09 MED ORDER — ONDANSETRON HCL 4 MG/2ML IJ SOLN: 4.0000 mg | Freq: Four times a day (QID) | INTRAMUSCULAR | Status: DC | PRN

## 2016-11-09 MED ORDER — AMLODIPINE BESYLATE 5 MG PO TABS: 10.0000 mg | ORAL_TABLET | Freq: Every day | ORAL | Status: DC

## 2016-11-09 MED ORDER — NYSTATIN 100000 UNIT/ML MT SUSP
5.0000 mL | Freq: Four times a day (QID) | OROMUCOSAL | Status: DC
  Administered 2016-11-09 – 2016-11-10 (×5): 500000 [IU] via ORAL
  Filled 2016-11-09 (×5): qty 5

## 2016-11-09 MED ORDER — DIPHENHYDRAMINE HCL 12.5 MG/5ML PO ELIX: 12.5000 mg | ORAL_SOLUTION | Freq: Four times a day (QID) | ORAL | Status: DC | PRN

## 2016-11-09 MED ORDER — EPHEDRINE SULFATE 50 MG/ML IJ SOLN
INTRAMUSCULAR | Status: DC | PRN
  Administered 2016-11-09: 10 mg via INTRAVENOUS

## 2016-11-09 MED ORDER — NEOSTIGMINE METHYLSULFATE 10 MG/10ML IV SOLN
INTRAVENOUS | Status: DC | PRN
  Administered 2016-11-09: 4 mg via INTRAVENOUS

## 2016-11-09 MED ORDER — ATORVASTATIN CALCIUM 40 MG PO TABS
40.0000 mg | ORAL_TABLET | Freq: Every day | ORAL | Status: DC
  Administered 2016-11-10: 40 mg via ORAL
  Filled 2016-11-09: qty 1

## 2016-11-09 MED ORDER — ROCURONIUM BROMIDE 100 MG/10ML IV SOLN
INTRAVENOUS | Status: DC | PRN
  Administered 2016-11-09: 30 mg via INTRAVENOUS

## 2016-11-09 MED ORDER — LISINOPRIL 10 MG PO TABS: 40.0000 mg | ORAL_TABLET | Freq: Every day | ORAL | Status: DC

## 2016-11-09 MED ORDER — LIDOCAINE HCL (PF) 1 % IJ SOLN
INTRAMUSCULAR | Status: AC
  Filled 2016-11-09: qty 5

## 2016-11-09 MED ORDER — HYDROMORPHONE 1 MG/ML IV SOLN
INTRAVENOUS | Status: DC
  Administered 2016-11-09: 0.3 mg via INTRAVENOUS
  Administered 2016-11-09: 0.5 mg via INTRAVENOUS
  Filled 2016-11-09 (×2): qty 25

## 2016-11-09 MED ORDER — BUPIVACAINE-EPINEPHRINE (PF) 0.5% -1:200000 IJ SOLN
INTRAMUSCULAR | Status: AC
  Filled 2016-11-09: qty 30

## 2016-11-09 MED ORDER — KETOROLAC TROMETHAMINE 30 MG/ML IJ SOLN
30.0000 mg | Freq: Four times a day (QID) | INTRAMUSCULAR | Status: DC
  Administered 2016-11-09 – 2016-11-10 (×5): 30 mg via INTRAVENOUS
  Filled 2016-11-09 (×5): qty 1

## 2016-11-09 MED ORDER — NEOSTIGMINE METHYLSULFATE 10 MG/10ML IV SOLN
INTRAVENOUS | Status: AC
  Filled 2016-11-09: qty 1

## 2016-11-09 MED ORDER — PANTOPRAZOLE SODIUM 40 MG PO TBEC
40.0000 mg | DELAYED_RELEASE_TABLET | Freq: Every day | ORAL | Status: DC
  Administered 2016-11-10: 40 mg via ORAL
  Filled 2016-11-09 (×2): qty 1

## 2016-11-09 MED ORDER — FENTANYL CITRATE (PF) 250 MCG/5ML IJ SOLN
INTRAMUSCULAR | Status: AC
  Filled 2016-11-09: qty 5

## 2016-11-09 MED ORDER — NALOXONE HCL 0.4 MG/ML IJ SOLN: 0.4000 mg | INTRAMUSCULAR | Status: DC | PRN

## 2016-11-09 MED ORDER — OXYCODONE-ACETAMINOPHEN 5-325 MG PO TABS
1.0000 | ORAL_TABLET | ORAL | Status: DC | PRN
  Administered 2016-11-09: 2 via ORAL
  Administered 2016-11-10: 1 via ORAL
  Filled 2016-11-09: qty 1
  Filled 2016-11-09: qty 2

## 2016-11-09 MED ORDER — GLYCOPYRROLATE 0.2 MG/ML IJ SOLN
0.2000 mg | Freq: Once | INTRAMUSCULAR | Status: AC
  Administered 2016-11-09: 0.2 mg via INTRAVENOUS

## 2016-11-09 MED ORDER — FLUCONAZOLE 100 MG PO TABS
150.0000 mg | ORAL_TABLET | Freq: Once | ORAL | Status: AC
  Administered 2016-11-09: 150 mg via ORAL
  Filled 2016-11-09: qty 2

## 2016-11-09 MED ORDER — PROPRANOLOL HCL ER 60 MG PO CP24
60.0000 mg | ORAL_CAPSULE | Freq: Two times a day (BID) | ORAL | Status: DC
  Filled 2016-11-09 (×2): qty 1

## 2016-11-09 MED ORDER — HYDROMORPHONE HCL 1 MG/ML IJ SOLN
0.2500 mg | INTRAMUSCULAR | Status: DC | PRN
  Administered 2016-11-09 (×2): 0.5 mg via INTRAVENOUS
  Filled 2016-11-09: qty 1

## 2016-11-09 MED ORDER — MIDAZOLAM HCL 2 MG/2ML IJ SOLN
1.0000 mg | INTRAMUSCULAR | Status: DC
  Administered 2016-11-09: 2 mg via INTRAVENOUS

## 2016-11-09 MED ORDER — METHIMAZOLE 5 MG PO TABS
5.0000 mg | ORAL_TABLET | Freq: Every day | ORAL | Status: DC
  Administered 2016-11-10: 5 mg via ORAL
  Filled 2016-11-09 (×3): qty 1

## 2016-11-09 MED ORDER — KETOROLAC TROMETHAMINE 30 MG/ML IJ SOLN
30.0000 mg | Freq: Once | INTRAMUSCULAR | Status: AC
  Administered 2016-11-09: 30 mg via INTRAVENOUS

## 2016-11-09 MED ORDER — ONDANSETRON HCL 4 MG PO TABS
4.0000 mg | ORAL_TABLET | Freq: Four times a day (QID) | ORAL | Status: DC | PRN
  Filled 2016-11-09: qty 1

## 2016-11-09 MED ORDER — SODIUM CHLORIDE 0.9% FLUSH: 9.0000 mL | INTRAVENOUS | Status: DC | PRN

## 2016-11-09 MED ORDER — PROPOFOL 10 MG/ML IV BOLUS
INTRAVENOUS | Status: AC
  Filled 2016-11-09: qty 40

## 2016-11-09 MED ORDER — LIDOCAINE HCL (CARDIAC) 20 MG/ML IV SOLN
INTRAVENOUS | Status: DC | PRN
  Administered 2016-11-09: 30 mg via INTRAVENOUS

## 2016-11-09 MED ORDER — GLYCOPYRROLATE 0.2 MG/ML IJ SOLN
INTRAMUSCULAR | Status: AC
  Filled 2016-11-09: qty 3

## 2016-11-09 MED ORDER — BUPIVACAINE-EPINEPHRINE 0.5% -1:200000 IJ SOLN
INTRAMUSCULAR | Status: DC | PRN
  Administered 2016-11-09: 10 mL
  Administered 2016-11-09: 20 mL

## 2016-11-09 MED ORDER — PAROXETINE HCL 20 MG PO TABS
60.0000 mg | ORAL_TABLET | Freq: Every day | ORAL | Status: DC
  Administered 2016-11-10: 60 mg via ORAL
  Filled 2016-11-09: qty 3

## 2016-11-09 MED ORDER — SODIUM CHLORIDE 0.9 % IV SOLN
INTRAVENOUS | Status: DC
  Administered 2016-11-09 (×2): via INTRAVENOUS

## 2016-11-09 MED ORDER — HYDROMORPHONE HCL 1 MG/ML IJ SOLN: 1.0000 mg | INTRAMUSCULAR | Status: DC | PRN

## 2016-11-09 MED ORDER — PROPOFOL 10 MG/ML IV BOLUS
INTRAVENOUS | Status: DC | PRN
  Administered 2016-11-09: 50 mg via INTRAVENOUS
  Administered 2016-11-09: 140 mg via INTRAVENOUS

## 2016-11-09 MED ORDER — 0.9 % SODIUM CHLORIDE (POUR BTL) OPTIME
TOPICAL | Status: DC | PRN
  Administered 2016-11-09: 1000 mL

## 2016-11-09 MED ORDER — CEFAZOLIN SODIUM-DEXTROSE 2-4 GM/100ML-% IV SOLN
INTRAVENOUS | Status: AC
  Filled 2016-11-09: qty 100

## 2016-11-09 MED ORDER — MIDAZOLAM HCL 2 MG/2ML IJ SOLN
INTRAMUSCULAR | Status: AC
  Filled 2016-11-09: qty 2

## 2016-11-09 MED ORDER — FENTANYL CITRATE (PF) 100 MCG/2ML IJ SOLN
INTRAMUSCULAR | Status: DC | PRN
  Administered 2016-11-09: 100 ug via INTRAVENOUS
  Administered 2016-11-09 (×2): 50 ug via INTRAVENOUS
  Administered 2016-11-09: 100 ug via INTRAVENOUS
  Administered 2016-11-09 (×3): 50 ug via INTRAVENOUS

## 2016-11-09 MED ORDER — ONDANSETRON HCL 4 MG/2ML IJ SOLN
INTRAMUSCULAR | Status: DC | PRN
  Administered 2016-11-09: 4 mg via INTRAVENOUS

## 2016-11-09 MED ORDER — KETOROLAC TROMETHAMINE 30 MG/ML IJ SOLN
INTRAMUSCULAR | Status: AC
  Filled 2016-11-09: qty 1

## 2016-11-09 MED ORDER — DIPHENHYDRAMINE HCL 50 MG/ML IJ SOLN: 12.5000 mg | Freq: Four times a day (QID) | INTRAMUSCULAR | Status: DC | PRN

## 2016-11-09 MED ORDER — SUCCINYLCHOLINE CHLORIDE 20 MG/ML IJ SOLN
INTRAMUSCULAR | Status: AC
  Filled 2016-11-09: qty 1

## 2016-11-09 MED ORDER — STERILE WATER FOR IRRIGATION IR SOLN
Status: DC | PRN
  Administered 2016-11-09: 1000 mL

## 2016-11-09 MED ORDER — CEFAZOLIN SODIUM-DEXTROSE 2-4 GM/100ML-% IV SOLN
2.0000 g | INTRAVENOUS | Status: AC
  Administered 2016-11-09: 2 g via INTRAVENOUS

## 2016-11-09 MED ORDER — LACTATED RINGERS IV SOLN
INTRAVENOUS | Status: DC
  Administered 2016-11-09 (×3): via INTRAVENOUS

## 2016-11-09 MED ORDER — SODIUM CHLORIDE 0.9 % IJ SOLN
INTRAMUSCULAR | Status: AC
  Filled 2016-11-09: qty 10

## 2016-11-09 MED ORDER — TRAZODONE HCL 50 MG PO TABS: 200.0000 mg | ORAL_TABLET | Freq: Every evening | ORAL | Status: DC | PRN

## 2016-11-09 MED ORDER — KETOROLAC TROMETHAMINE 30 MG/ML IJ SOLN
30.0000 mg | Freq: Four times a day (QID) | INTRAMUSCULAR | Status: DC
  Filled 2016-11-09: qty 1

## 2016-11-09 MED ORDER — GLYCOPYRROLATE 0.2 MG/ML IJ SOLN
INTRAMUSCULAR | Status: DC | PRN
  Administered 2016-11-09: 0.6 mg via INTRAVENOUS

## 2016-11-09 MED ORDER — SUCCINYLCHOLINE CHLORIDE 20 MG/ML IJ SOLN
INTRAMUSCULAR | Status: DC | PRN
  Administered 2016-11-09: 120 mg via INTRAVENOUS

## 2016-11-09 MED ORDER — IBUPROFEN 600 MG PO TABS
600.0000 mg | ORAL_TABLET | Freq: Four times a day (QID) | ORAL | Status: DC | PRN
  Filled 2016-11-09: qty 1

## 2016-11-09 SURGICAL SUPPLY — 37 items
APPLIER CLIP 13 LRG OPEN (CLIP) ×5
BAG HAMPER (MISCELLANEOUS) ×5 IMPLANT
CLIP APPLIE 13 LRG OPEN (CLIP) ×3 IMPLANT
CLOTH BEACON ORANGE TIMEOUT ST (SAFETY) ×5 IMPLANT
COVER LIGHT HANDLE STERIS (MISCELLANEOUS) ×10 IMPLANT
DECANTER SPIKE VIAL GLASS SM (MISCELLANEOUS) ×5 IMPLANT
DRAPE HALF SHEET 40X57 (DRAPES) ×5 IMPLANT
DRAPE STERI URO 9X17 APER PCH (DRAPES) ×5 IMPLANT
ELECT REM PT RETURN 9FT ADLT (ELECTROSURGICAL) ×5
ELECTRODE REM PT RTRN 9FT ADLT (ELECTROSURGICAL) ×3 IMPLANT
FORMALIN 10 PREFIL 480ML (MISCELLANEOUS) ×5 IMPLANT
GAUZE PACKING 2X5 YD STRL (GAUZE/BANDAGES/DRESSINGS) ×5 IMPLANT
GAUZE SPONGE 4X4 12PLY STRL (GAUZE/BANDAGES/DRESSINGS) ×5 IMPLANT
GAUZE SPONGE 4X4 16PLY XRAY LF (GAUZE/BANDAGES/DRESSINGS) ×5 IMPLANT
GLOVE BIOGEL PI IND STRL 7.0 (GLOVE) ×9 IMPLANT
GLOVE BIOGEL PI IND STRL 9 (GLOVE) ×3 IMPLANT
GLOVE BIOGEL PI INDICATOR 7.0 (GLOVE) ×6
GLOVE BIOGEL PI INDICATOR 9 (GLOVE) ×2
GLOVE ECLIPSE 6.5 STRL STRAW (GLOVE) ×10 IMPLANT
GLOVE ECLIPSE 9.0 STRL (GLOVE) ×10 IMPLANT
GOWN SPEC L3 XXLG W/TWL (GOWN DISPOSABLE) ×5 IMPLANT
GOWN STRL REUS W/TWL LRG LVL3 (GOWN DISPOSABLE) ×10 IMPLANT
IV NS IRRIG 3000ML ARTHROMATIC (IV SOLUTION) ×5 IMPLANT
KIT ROOM TURNOVER AP CYSTO (KITS) ×5 IMPLANT
MANIFOLD NEPTUNE II (INSTRUMENTS) ×5 IMPLANT
NS IRRIG 1000ML POUR BTL (IV SOLUTION) ×5 IMPLANT
PACK PERI GYN (CUSTOM PROCEDURE TRAY) ×5 IMPLANT
PAD ARMBOARD 7.5X6 YLW CONV (MISCELLANEOUS) ×5 IMPLANT
SET BASIN LINEN APH (SET/KITS/TRAYS/PACK) ×5 IMPLANT
SUT CHROMIC 0 CT 1 (SUTURE) ×50 IMPLANT
SUT CHROMIC 2 0 CT 1 (SUTURE) ×15 IMPLANT
SUT PROLENE 2 0 SH 30 (SUTURE) ×5 IMPLANT
SUT VIC AB 0 CT2 8-18 (SUTURE) ×5 IMPLANT
TRAY FOLEY CATH SILVER 16FR (SET/KITS/TRAYS/PACK) ×5 IMPLANT
TRAY FOLEY W/METER SILVER 16FR (SET/KITS/TRAYS/PACK) ×5 IMPLANT
VERSALIGHT (MISCELLANEOUS) ×5 IMPLANT
WATER STERILE IRR 1000ML POUR (IV SOLUTION) ×5 IMPLANT

## 2016-11-09 NOTE — Transfer of Care (Signed)
Immediate Anesthesia Transfer of Care Note  Patient: TICHINA KOEBEL  Procedure(s) Performed: Procedure(s): HYSTERECTOMY VAGINAL (N/A) ANTERIOR (CYSTOCELE) AND POSTERIOR REPAIR (RECTOCELE) (N/A) SALPINGO OOPHORECTOMY (Right) SALPINGECTOMY (Left)  Patient Location: PACU  Anesthesia Type:General  Level of Consciousness: awake, oriented and patient cooperative  Airway & Oxygen Therapy: Patient Spontanous Breathing and Patient connected to face mask oxygen  Post-op Assessment: Report given to RN and Post -op Vital signs reviewed and stable  Post vital signs: Reviewed and stable  Last Vitals:  Vitals:   11/09/16 0725 11/09/16 1010  BP:    Pulse:  74  Resp: (!) 23 10  Temp:      Last Pain:  Vitals:   11/09/16 0641  TempSrc: Oral  PainSc: 5       Patients Stated Pain Goal: 8 (83/37/44 5146)  Complications: No apparent anesthesia complications

## 2016-11-09 NOTE — Brief Op Note (Addendum)
11/09/2016  10:17 AM  PATIENT:  [name]  61 y.o. female  PRE-OPERATIVE DIAGNOSIS:  uterine descensus cystocele,CIN II abnormal pap (endocervical lesion)  POST-OPERATIVE DIAGNOSIS:  uterine descensus cystocele,CIN II abnormal pap (endocervical lesion)  PROCEDURE:  Procedure(s): HYSTERECTOMY VAGINAL (N/A) ANTERIOR (CYSTOCELE) AND POSTERIOR REPAIR (RECTOCELE) (N/A) SALPINGO OOPHORECTOMY (Right) SALPINGECTOMY (Left)  SURGEON:  Surgeon(s) and Role:    Jonnie Kind, MD - Primary  PHYSICIAN ASSISTANT:   ASSISTANTS: [place] RNFA   ANESTHESIA:   local and general  EBL:  Total I/O In: 2000 [I.V.:2000] Out: 750 [Urine:600; Blood:150]  BLOOD ADMINISTERED:none  DRAINS: Urinary Catheter (Foley) and Vaginal packing present Betadine soaked   LOCAL MEDICATIONS USED:  MARCAINE    and NONE  SPECIMEN:  Source of Specimen:  Uterus and cervix, right tube and ovary, left fallopian tube  DISPOSITION OF SPECIMEN:  PATHOLOGY  COUNTS:  YES  TOURNIQUET:  * No tourniquets in log *  DICTATION: .Dragon Dictation  PLAN OF CARE: Admit for overnight observation  PATIENT DISPOSITION:  PACU - hemodynamically stable.   Delay start of Pharmacological VTE agent (>24hrs) due to surgical blood loss or risk of bleeding: not applicable

## 2016-11-09 NOTE — Interval H&P Note (Signed)
History and Physical Interval Note:  11/09/2016 7:17 AM  [name]  has presented today for surgery, with the diagnosis of rectocele and cystocele, atypical squamous cells,cervical dysplasia  The various methods of treatment have been discussed with the patient and family. After consideration of risks, benefits and other options for treatment, the patient has consented to  Procedure(s): HYSTERECTOMY VAGINAL (N/A) ANTERIOR (CYSTOCELE) AND POSTERIOR REPAIR (RECTOCELE) (N/A) SALPINGO OOPHORECTOMY (Bilateral) as a surgical intervention .  The patient's history has been reviewed, patient examined, no change in status, stable for surgery.  I have reviewed the patient's chart and labs.  Questions were answered to the patient's satisfaction.   She had geographic tongue, and we will seek EENT's opinion. Photos in [place].   Jonnie Kind

## 2016-11-09 NOTE — Progress Notes (Signed)
Received verbal order to D/C Dilaudid PCA and to start Dilaudid 1-2 mg Q3hrs PRN for pain from Dr. Glo Herring.

## 2016-11-09 NOTE — Anesthesia Procedure Notes (Signed)
Procedure Name: Intubation Date/Time: 11/09/2016 7:39 AM Performed by: Andree Elk, AMY A Pre-anesthesia Checklist: Patient identified, Patient being monitored, Timeout performed, Emergency Drugs available and Suction available Patient Re-evaluated:Patient Re-evaluated prior to induction Oxygen Delivery Method: Circle System Utilized Preoxygenation: Pre-oxygenation with 100% oxygen Induction Type: IV induction, Rapid sequence and Cricoid Pressure applied Ventilation: Mask ventilation without difficulty Laryngoscope Size: Glidescope Grade View: Grade I Tube type: Oral Tube size: 7.0 mm Number of attempts: 1 Airway Equipment and Method: Stylet Placement Confirmation: ETT inserted through vocal cords under direct vision,  positive ETCO2 and breath sounds checked- equal and bilateral Secured at: 21 cm Tube secured with: Tape Dental Injury: Teeth and Oropharynx as per pre-operative assessment

## 2016-11-09 NOTE — Anesthesia Preprocedure Evaluation (Signed)
Anesthesia Evaluation  Patient identified by MRN, date of birth, ID band Patient awake    Reviewed: Allergy & Precautions, NPO status , Patient's Chart, lab work & pertinent test results  Airway Mallampati: III  TM Distance: <3 FB Neck ROM: Full    Dental  (+) Edentulous Upper, Edentulous Lower   Pulmonary Current Smoker,    breath sounds clear to auscultation       Cardiovascular hypertension, Pt. on medications  Rhythm:Regular Rate:Normal     Neuro/Psych PSYCHIATRIC DISORDERS Depression    GI/Hepatic negative GI ROS,   Endo/Other  Hypothyroidism   Renal/GU      Musculoskeletal   Abdominal   Peds  Hematology   Anesthesia Other Findings   Reproductive/Obstetrics                             Anesthesia Physical Anesthesia Plan  ASA: III  Anesthesia Plan: General   Post-op Pain Management:    Induction: Intravenous  PONV Risk Score and Plan:   Airway Management Planned: Oral ETT and Video Laryngoscope Planned  Additional Equipment:   Intra-op Plan:   Post-operative Plan: Extubation in OR  Informed Consent: I have reviewed the patients History and Physical, chart, labs and discussed the procedure including the risks, benefits and alternatives for the proposed anesthesia with the patient or authorized representative who has indicated his/her understanding and acceptance.     Plan Discussed with:   Anesthesia Plan Comments:         Anesthesia Quick Evaluation

## 2016-11-09 NOTE — Anesthesia Postprocedure Evaluation (Signed)
Anesthesia Post Note  Patient: [name]  Procedure(s) Performed: Procedure(s) (LRB): HYSTERECTOMY VAGINAL (N/A) ANTERIOR (CYSTOCELE) AND POSTERIOR REPAIR (RECTOCELE) (N/A) SALPINGO OOPHORECTOMY (Right) SALPINGECTOMY (Left)  Patient location during evaluation: PACU Anesthesia Type: General Level of consciousness: awake and alert, oriented and patient cooperative Pain management: pain level controlled Vital Signs Assessment: post-procedure vital signs reviewed and stable Respiratory status: spontaneous breathing Cardiovascular status: stable Anesthetic complications: no     Last Vitals:  Vitals:   11/09/16 1010 11/09/16 1015  BP:  127/68  Pulse: 74 71  Resp: 10 16  Temp: 36.8 C     Last Pain:  Vitals:   11/09/16 0641  TempSrc: Oral  PainSc: 5                  ADAMS, AMY A

## 2016-11-09 NOTE — Op Note (Signed)
11/09/2016  10:17 AM  PATIENT:  [name]  61 y.o. female  PRE-OPERATIVE DIAGNOSIS:  uterine descensus cystocele,CIN II abnormal pap (endocervical lesion)  POST-OPERATIVE DIAGNOSIS:  uterine descensus cystocele,CIN II abnormal pap (endocervical lesion)  PROCEDURE:  Procedure(s): HYSTERECTOMY VAGINAL (N/A) ANTERIOR (CYSTOCELE) AND POSTERIOR REPAIR (RECTOCELE) (N/A) SALPINGO OOPHORECTOMY (Right) SALPINGECTOMY (Left)  SURGEON:  Surgeon(s) and Role:    Jonnie Kind, MD - Primary  PHYSICIAN ASSISTANT:   ASSISTANTS: [place] RNFA   ANESTHESIA:   local and general  EBL:  Total I/O In: 2000 [I.V.:2000] Out: 750 [Urine:600; Blood:150]  BLOOD ADMINISTERED:none  DRAINS: Urinary Catheter (Foley) and Vaginal packing present Betadine soaked   LOCAL MEDICATIONS USED:  MARCAINE    and NONE  SPECIMEN:  Source of Specimen:  Uterus and cervix, right tube and ovary, left fallopian tube  DISPOSITION OF SPECIMEN:  PATHOLOGY  COUNTS:  YES  TOURNIQUET:  * No tourniquets in log *  DICTATION: .Dragon Dictation  PLAN OF CARE: Admit for overnight observation  PATIENT DISPOSITION:  PACU - hemodynamically stable.   Delay start of Pharmacological VTE agent (>24hrs) due to surgical blood loss or risk of bleeding: not applicable  Details of procedure: Patient was taken operating room prepped and draped for vaginal procedure with legs in candycane support stirrups timeout was conducted and Ancef administered and procedure confirmed by surgical team. Cervix was grasped with Lahey thyroid tenaculum, and the cervix circumscribed with Marcaine injected and then Bovie cautery used to circumscribe the cervix. Anteriorly the avascular vesicouterine space could be identified and dissected up to identify the anterior peritoneum where the anterior peritoneum was entered bladder was retracted with retractor.Marland Kitchen Posteriorly a colpotomy incision was made and the cul-de-sac entered without difficulty.  Sacral ligaments were then clamped cut and suture ligated bilaterally using curved Zeppelin clamps, Mayo scissors transection and 0 chromic suture ligature which was tagged. The cardinal ligaments were clamped cut and suture ligated beginning on the right side. Remainder of the support laterally was then serially clamped cut and suture ligated using Zeppelin clamps, Mayo scissor transection and 0 chromic suture ligature. The ligament and utero-ovarian ligament pedicles were tagged for identification and traction. Uterus was removed completely. Foley catheter was inserted draining 300 cc of clear urine in the bag placed on the at sterile abdominal field. Pedicles were inspected and it was deemed that we could access the right tube and ovary sufficiently to remove it and this was performed. On the left side the fallopian tube could be placed on sufficient traction to access and but the ovary was somewhat adherent to the sidewall appeared normal in character but was beyond the ability to access per vagina and was left in situ. The salpingectomy was performed using Zeppelin clamp and 0 chromic ligature. Pedicles were inspected and confirmed as hemostatic. Lower cardinal ligament on the patient's left side required one additional small suture A 2-0 Prolene suture was placed on the medial aspect of the sacral ligament on each side pulling the uterosacral ligaments closer together incorporating the cul-de-sac tissues to improve support and reduce enterocele potential peritoneum was then closed in a running fashion side to side beginning on the patient's left and sewing across the right with sponge and needle counts correct.\ Anterior repair: The and and stretched and tissue of the cystocele was then addressed. The anterior vaginal epithelium was infiltrated with Marcaine solution 10 10 cc split in the midline from just superior to the trigone to within 1 cm of the vaginal  cuff. An elliptical area was dissected free from  the underlying cystocele, extending 1 cm on each side in an elliptical fashion followed by placing of 4 horizontal mattress sutures in the side of the vaginal epithelium to pulling the tissues together beneath the cystocele and reducing the redundancy of the anterior vaginal wall. Redundant tissues were trimmed. 2-0 chromic sutures were used to reapproximate the anterior epithelium. Support was significantly improved. Cuff closure was then performed pulling the cuff posteriorly and attachment to the colpotomy incision site posteriorly and the uterosacral ligaments giving a improved support to the anterior wall. Interrupted 0 chromic was used for reapproximation of the cuff. Posterior repair: Allis clamps were placed in the hymen remnants at 5 and 7:00. Was used to infiltrate the perineal body 10 cc and then the vaginal epithelium split posteriorly lines of the old episiotomy site for a distance approximate 5 cm of posterior vagina with sharp dissection of the epithelium off the underlying tissues. A double gloved finger was then placed in the rectum going underneath the vaginal bed to achieve this. The epithelium had been dissected off of the underlying connective tissues and dissected laterally sufficiently that Allis clamps could then be used to grasp lateral supportive tissues for the bottom 4 cm of the vagina and pull it in the midline with a series of 3 interrupted horizontal mattress sutures of 0 Vicryl. Placed while maintaining the index finger in the rectum in a sterile fashion and being sure that the rectum was not involved knee sutures. Hand was removed from the rectum and gloves changed. The sutures were then tied down these 0 Vicryl pop-off sutures as mentioned earlier. Redundant epithelium was trimmed and then continuous running 2-0 chromic used to close the epithelium defect. Sponge and needle counts were correct patient to recovery room in good condition

## 2016-11-10 DIAGNOSIS — C539 Malignant neoplasm of cervix uteri, unspecified: Secondary | ICD-10-CM | POA: Diagnosis not present

## 2016-11-10 LAB — BASIC METABOLIC PANEL
ANION GAP: 5 (ref 5–15)
BUN: 6 mg/dL (ref 6–20)
CHLORIDE: 108 mmol/L (ref 101–111)
CO2: 27 mmol/L (ref 22–32)
Calcium: 8.2 mg/dL — ABNORMAL LOW (ref 8.9–10.3)
Creatinine, Ser: 0.92 mg/dL (ref 0.44–1.00)
GFR calc Af Amer: >60 mL/min (ref >60)
GLUCOSE: 101 mg/dL — AB (ref 65–99)
POTASSIUM: 4.2 mmol/L (ref 3.5–5.1)
Sodium: 140 mmol/L (ref 135–145)

## 2016-11-10 LAB — CBC
HEMATOCRIT: 35.7 % — AB (ref 36.0–46.0)
HEMATOCRIT: 39.3 % (ref 36.0–46.0)
HEMOGLOBIN: 11.7 g/dL — AB (ref 12.0–15.0)
HEMOGLOBIN: 12.9 g/dL (ref 12.0–15.0)
MCH: 30.2 pg (ref 26.0–34.0)
MCH: 30 pg (ref 26.0–34.0)
MCHC: 32.8 g/dL (ref 30.0–36.0)
MCHC: 32.8 g/dL (ref 30.0–36.0)
MCV: 92 fL (ref 78.0–100.0)
MCV: 91.4 fL (ref 78.0–100.0)
Platelets: 213 10*3/uL (ref 150–400)
Platelets: 212 10*3/uL (ref 150–400)
RBC: 3.88 MIL/uL (ref 3.87–5.11)
RBC: 4.3 MIL/uL (ref 3.87–5.11)
RDW: 13.2 % (ref 11.5–15.5)
RDW: 13.2 % (ref 11.5–15.5)
WBC: 10.9 10*3/uL — AB (ref 4.0–10.5)
WBC: 13.9 10*3/uL — ABNORMAL HIGH (ref 4.0–10.5)

## 2016-11-10 MED ORDER — HYDROMORPHONE HCL 1 MG/ML IJ SOLN: 1.0000 mg | INTRAMUSCULAR | Status: DC | PRN

## 2016-11-10 MED ORDER — NYSTATIN 100000 UNIT/ML MT SUSP: 5.0000 mL | Freq: Four times a day (QID) | OROMUCOSAL | 0 refills | Status: DC

## 2016-11-10 MED ORDER — IBUPROFEN 600 MG PO TABS: 600.0000 mg | ORAL_TABLET | Freq: Four times a day (QID) | ORAL | 0 refills | Status: DC | PRN

## 2016-11-10 MED ORDER — HYDROCODONE-ACETAMINOPHEN 5-325 MG PO TABS: 1.0000 | ORAL_TABLET | Freq: Four times a day (QID) | ORAL | 0 refills | Status: DC | PRN

## 2016-11-10 NOTE — Progress Notes (Signed)
1 Day Post-Op Procedure(s) (LRB): HYSTERECTOMY VAGINAL (N/A) ANTERIOR (CYSTOCELE) AND POSTERIOR REPAIR (RECTOCELE) (N/A) RIGHT SALPINGO OOPHORECTOMY (Right) LEFT SALPINGECTOMY (Left)  Subjective: Patient reports that she has smoked x years. She will need 02 supplementation to get there o2 sats up to acceptable levels..    Objective: I have reviewed patient's vital signs and labs. Pt ambulating in hall well unaided other than needing her 02.  General: alert, cooperative and no distress See exam on d/c Assessment: Pt has evidence of COPD from her smoking.  s/p Procedure(s): HYSTERECTOMY VAGINAL (N/A) ANTERIOR (CYSTOCELE) AND POSTERIOR REPAIR (RECTOCELE) (N/A) RIGHT SALPINGO OOPHORECTOMY (Right) LEFT SALPINGECTOMY (Left): stable and baseline hypoxia due to copd.  Plan: Advance diet Discharge home  LOS: 1 day    [name] 11/10/2016, 1:44 PM

## 2016-11-10 NOTE — Progress Notes (Signed)
SATURATION QUALIFICATIONS: (This note is used to comply with regulatory documentation for home oxygen)  Patient Saturations on Room Air at Rest = 81%  Patient Saturations on Room Air while Ambulating = 86%  Patient Saturations on 2 Liters of oxygen while Ambulating = 90%  Please briefly explain why patient needs home oxygen: Pt will require home oxygen at discharge due to decrease in o2 saturations with ambulation.

## 2016-11-10 NOTE — Discharge Summary (Signed)
Physician Discharge Summary  Patient ID: [name] MRN: [id number] DOB/AGE: Apr 26, 1955 61 y.o.  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 Active Problems:   S/P vaginal hysterectomy with anterior, and posterior repair, right salpingo-oophorectomy and left salpingectomy   Status post vaginal hysterectomy   Discharged Condition: good  Hospital Course: [name] is a 61 year old female admitted for vaginal hysterectomy anterior and posterior repair with planned removal of tubes and ovaries for symptomatic cystocele small rectocele and grade 1 uterine descensus found on preoperative evaluation have Pap smear reported as ASC-H with colposcopic biopsies of the exocervix negative and endocervical curettage positive for "at least CIN-2". She underwent vaginal hysterectomy anterior and posterior repair with removal of right tube and ovary and left tube. Left ovary was adherent to the sidewall and difficult to access.she had an postop stay notable for hypotension and bradycardia related to her 3 blood pressure medicines which were held during her postoperative course. She was restarted on 2 of them after discharge upon reexamination in the office 48 hours later Postop course also notable for relative hypoxia with oxygen saturations when not on 2 L oxygen noted to be oxygen saturations in the a high 80s, with improvement on oxygen supplementation. She was therefore discharged on oxygen supplementation. A review of old chest x-rays suggested emphysematous changes consistent with COPD, consistent with patient smoking history Pathology report returnedAfter discharge with a 6 mm x 5 mm invasive cervical cancer located in the endocervix with surgical margins considered negative, and noted to be only 1 m in thickness in the area where the invasion was  noted. Standard extrafascial hysterectomy technique was used in clamps repositioned during traction and countertraction utilized for removal of her uterus. This was not known if this affected the relatively thin margins measured at processing the specimen.  Consults: None  Significant Diagnostic Studies: Notes recorded by Jonnie Kind, MD on 11/13/2016 at 7:52 AM EDT Microscopic Comment UTERINE CERVIX Specimen: Uterus, cervix, bilateral fallopian tubes, right ovary. Procedure: Total hysterectomy with bilateral salpingectomy and right oophorectomy. Tumor site (quadrant if known): 11:00-12:00. Maximum tumor size (cm): 0.6 cm. Histologic type: Invasive squamous cell carcinoma. Grade: Well-differentiated. Margins: Negative. Distance of carcinoma from closest margin: 0.3 cm (cervical soft tissue margin). Depth of invasion: 5 mm in depth where cervix is 8 mm in thickness. Horizontal extent (mm): 6 mm. Lymph-Vascular invasion: Not identified. Vaginal extension: Not identified. Uterine corpus extension: Not identified. Parametrial involvement: N/A. Lymph nodes: number examined 0 ; number positive 0 TNM code: DE0C1, pN0       labs:  CBC Latest Ref Rng & Units 11/10/2016 11/10/2016 11/04/2016  WBC 4.0 - 10.5 K/uL 13.9(H) 10.9(H) 9.6  Hemoglobin 12.0 - 15.0 g/dL 12.9 11.7(L) 15.9(H)  Hematocrit 36.0 - 46.0 % 39.3 35.7(L) 46.7(H)  Platelets 150 - 400 K/uL 212 213 266      Treatments: surgery: Vaginal hysterectomy right salpinoophorectomy, left salpingectomy, anterior and posterior repair.  Discharge Exam: Blood pressure (!) 98/58, pulse 65, temperature 98.3 F (36.8 C), temperature source Oral, resp. rate 18, height 5\' 4"  (1.626 m), weight 161 lb (73 kg), SpO2 92 %. General appearance: alert, cooperative, appears older than stated age and no distress Head: Normocephalic, without obvious abnormality, atraumatic Eyes: conjunctivae/corneas clear. PERRL, EOM's intact. Fundi benign. Back:  symmetric, no curvature. ROM normal. No CVA tenderness., no back pain, better than at admit Resp: clear to auscultation bilaterally  GI: soft, non-tender; bowel sounds normal; no masses,  no organomegaly  Disposition: 01-Home or Self Carefollow-up in 2 days. She's been informed of the diagnosis of invasive cancer, cases been discussed with Dr. Dossie Arbour, GYN oncology, with plans for referral at 4 weeks postop for consultation for consideration of Therapy options such as pelvic radiation therapy or lymph node sampling., with consideration furtherdiagnostic testing such as PET scan.  Discharge Instructions    Call MD for:  difficulty breathing, headache or visual disturbances    Complete by:  As directed    Call MD for:  persistant dizziness or light-headedness    Complete by:  As directed    Call MD for:  persistant nausea and vomiting    Complete by:  As directed    Call MD for:  severe uncontrolled pain    Complete by:  As directed    Call MD for:  temperature >100.4    Complete by:  As directed    Diet - low sodium heart healthy    Complete by:  As directed    Discharge instructions    Complete by:  As directed    Please make an appointment our office Friday 8:30 am for bp check.   Increase activity slowly    Complete by:  As directed       Follow-up Information    Jonnie Kind, MD Follow up in 2 day(s).   Specialties:  Obstetrics and Gynecology, Radiology Why:  may come at 8:30 Friday. Contact information: [address] [place] [address] [phone]           Signed: Jonnie Kind 11/10/2016, 1:34 PM

## 2016-11-10 NOTE — Anesthesia Postprocedure Evaluation (Signed)
Anesthesia Post Note  Patient: [name]  Procedure(s) Performed: Procedure(s) (LRB): HYSTERECTOMY VAGINAL (N/A) ANTERIOR (CYSTOCELE) AND POSTERIOR REPAIR (RECTOCELE) (N/A) RIGHT SALPINGO OOPHORECTOMY (Right) LEFT SALPINGECTOMY (Left)  Patient location during evaluation: Nursing Unit Anesthesia Type: General Level of consciousness: awake and alert and oriented Pain management: pain level controlled Vital Signs Assessment: post-procedure vital signs reviewed and stable Respiratory status: spontaneous breathing Cardiovascular status: blood pressure returned to baseline Postop Assessment: no headache Anesthetic complications: no     Last Vitals:  Vitals:   11/10/16 1002 11/10/16 1157  BP: (!) 97/56 (!) 98/58  Pulse: 61 65  Resp:    Temp:      Last Pain:  Vitals:   11/10/16 0755  TempSrc:   PainSc: 3                  Juandaniel Manfredo,Nazirah

## 2016-11-10 NOTE — Addendum Note (Signed)
Addendum  created 11/10/16 1408 by Ollen Bowl, CRNA   Sign clinical note

## 2016-11-10 NOTE — Care Management Note (Addendum)
Case Management Note  Patient Details  Name: [name] MRN: [id number] Date of Birth: 04-01-1956    Expected Discharge Date:  11/10/16               Expected Discharge Plan:  Home/Self Care  In-House Referral:     Discharge planning Services  CM Consult  Post Acute Care Choice:  Durable Medical Equipment Choice offered to:  Patient  DME Arranged:  Oxygen DME Agency:  [place]:    [facility] Agency:     Status of Service:  Completed, signed off  If discussed at [place] of Stay Meetings, dates discussed:    Additional Comments: Patient discharging home today. Qualifies for home oxygen, alternative treatments such as incentive spirometry along with Turning, coughing, deep breathing have been tried and failed. [place] Calix of [facility] notified and will obtain orders from chart.  No other CM needs.  Raynie Steinhaus, Chauncey Reading, RN 11/10/2016, 1:58 PM

## 2016-11-10 NOTE — Progress Notes (Signed)
D/C instructions given to pt. Verbalized understanding. Pt walked downstairs with nursing staff to family who is transporting home.

## 2016-11-10 NOTE — Discharge Instructions (Addendum)
Come to my office Friday at 8:30 for BP recheck. We may restart some or all of your blood pressure medications then.    Vaginal Hysterectomy, Care After Refer to this sheet in the next few weeks. These instructions provide you with information about caring for yourself after your procedure. Your health care provider may also give you more specific instructions. Your treatment has been planned according to current medical practices, but problems sometimes occur. Call your health care provider if you have any problems or questions after your procedure. What can I expect after the procedure? After the procedure, it is common to have:   Pain.  Soreness and numbness in your incision areas.  Vaginal bleeding and discharge.  Constipation.  Temporary problems emptying the bladder.  Feelings of sadness or other emotions.  Follow these instructions at home: Medicines  Take over-the-counter and prescription medicines only as told by your health care provider.  If you were prescribed an antibiotic medicine, take it as told by your health care provider. Do not stop taking the antibiotic even if you start to feel better.  Do not drive or operate heavy machinery while taking prescription pain medicine. Activity  Return to your normal activities as told by your health care provider. Ask your health care provider what activities are safe for you.  Get regular exercise as told by your health care provider. You may be told to take short walks every day and go farther each time.  Do not lift anything that is heavier than 10 lb (4.5 kg). General instructions   Do not put anything in your vagina for 6 weeks after your surgery or as told by your health care provider. This includes tampons and douches.  Do not have sex until your health care provider says you can.  Do not take baths, swim, or use a hot tub until your health care provider approves.  Drink enough fluid to keep your urine clear or  pale yellow.  Do not drive for 24 hours if you were given a sedative.  Keep all follow-up visits as told by your health care provider. This is important. Contact a health care provider if:  Your pain medicine is not helping.  You have a fever.  You have redness, swelling, or pain at your incision site.  You have blood, pus, or a bad-smelling discharge from your vagina.  You continue to have difficulty urinating. Get help right away if:  You have severe abdominal or back pain.  You have heavy bleeding from your vagina.  You have chest pain or shortness of breath. This information is not intended to replace advice given to you by your health care provider. Make sure you discuss any questions you have with your health care provider. Document Released: 07/21/2015 Document Revised: 09/04/2015 Document Reviewed: 04/13/2015 Elsevier Interactive Patient Education  2018 [place] to my office Friday at 8:30 for BP recheck. We may restart some or all of your blood pressure medications then.

## 2016-11-10 NOTE — Progress Notes (Signed)
D- Pt's SBP has ranged from low 80's to low 100's this shift with most recent being 85/53.  Pt has remained asymptomatic with HR WNL and good urinary output receiving IVF via pump at 125 ml/hr.  A- Dr. Glo Herring notified via phone of the above and new orders were given to hold blood pressure medications this am.  R- Pt remains in bed asymptomatic.  BP now 88/66 and Dr. Glo Herring has been at bedside to assess patient.  He removed her foley and vaginal packing.  Nursing staff to continue to monitor

## 2016-11-10 NOTE — Progress Notes (Addendum)
1 Day Post-Op Procedure(s) (LRB): HYSTERECTOMY VAGINAL (N/A) ANTERIOR (CYSTOCELE) AND POSTERIOR REPAIR (RECTOCELE) (N/A) RIGHT SALPINGO OOPHORECTOMY (Right) LEFT SALPINGECTOMY (Left)  Subjective: Patient reports incisional pain, tolerating PO, + flatus and no problems voiding.    Objective: I have reviewed patient's vital signs, intake and output and medications. BP (!) 88/66 (BP Location: Left Arm)   Pulse (!) 54   Temp 98.3 F (36.8 C) (Oral)   Resp 18   Ht 5\' 4"  (1.626 m)   Wt 161 lb (73 kg)   SpO2 96%   BMI 27.64 kg/m  CBC Latest Ref Rng & Units 11/10/2016 11/04/2016 07/01/2016  WBC 4.0 - 10.5 K/uL 10.9(H) 9.6 10.2  Hemoglobin 12.0 - 15.0 g/dL 11.7(L) 15.9(H) 16.8(H)  Hematocrit 36.0 - 46.0 % 35.7(L) 46.7(H) 49.1(H)  Platelets 150 - 400 K/uL 213 266 280   CMP Latest Ref Rng & Units 11/10/2016 11/04/2016 07/01/2016  Glucose 65 - 99 mg/dL 101(H) 113(H) 100(H)  BUN 6 - 20 mg/dL 6 13 10   Creatinine 0.44 - 1.00 mg/dL 0.92 0.84 0.92  Sodium 135 - 145 mmol/L 140 135 138  Potassium 3.5 - 5.1 mmol/L 4.2 4.3 3.9  Chloride 101 - 111 mmol/L 108 102 105  CO2 22 - 32 mmol/L 27 23 24   Calcium 8.9 - 10.3 mg/dL 8.2(L) 9.2 9.0    General: alert, cooperative and no distress Resp: clear to auscultation bilaterally GI: soft, non-tender; bowel sounds normal; no masses,  no organomegaly Vaginal Bleeding: none vagina packing clear at removal   Assessment: Postop hypotension, assymptomatic => will hold BP meds today, recheck cbc midday to look for any indication of occult blood loss. s/p Procedure(s): HYSTERECTOMY VAGINAL (N/A) ANTERIOR (CYSTOCELE) AND POSTERIOR REPAIR (RECTOCELE) (N/A) RIGHT SALPINGO OOPHORECTOMY (Right) LEFT SALPINGECTOMY (Left): stable  Plan: Advance diet recheck CBC   Hold CBC  LOS: 1 day    Raylen Tangonan V 11/10/2016, 7:32 AM

## 2016-11-12 ENCOUNTER — Encounter: Payer: Self-pay | Admitting: Obstetrics and Gynecology

## 2016-11-12 ENCOUNTER — Ambulatory Visit (INDEPENDENT_AMBULATORY_CARE_PROVIDER_SITE_OTHER): Payer: Medicaid Other | Admitting: Obstetrics and Gynecology

## 2016-11-12 VITALS — BP 142/82 | HR 96 | Wt 160.8 lb

## 2016-11-12 DIAGNOSIS — J449 Chronic obstructive pulmonary disease, unspecified: Secondary | ICD-10-CM | POA: Diagnosis not present

## 2016-11-12 DIAGNOSIS — C530 Malignant neoplasm of endocervix: Secondary | ICD-10-CM

## 2016-11-12 NOTE — Progress Notes (Signed)
Patient ID: [name], female   DOB: Jan 17, 1956, 61 y.o.   MRN: [id number] Subjective:  [name] is a 61 y.o. female now 3 days status post vaginal hysterectomy.   Problems today are 0 1 hypotension patient was on 3 blood pressure medicines and had hypotension and bradycardia during the postop phase in the so the blood pressure medicines were stopped. Today should her pressures are more normal and will be to restart some the medicines 2. Cervical cancer pathology report is returned showing a 6 mm x 5 mm cervical cancer located in the endocervix. Margins were described as clear but were quite thin at 3 mm. Of note the cervix was manipulated during the removal process with traction and countertraction  Pt [facility] at [facility] who she sees for her HTN issues. Her next appointment with her PCP is in 8 days. Pt voices concern for her tongue issues that weren't resolved with nystatin Rx. Denies any other symptoms. She notes that she is still smoking cigarettes at this time. See photo in media section  Review of Systems Negative except    Diet:   negative   Bowel movements : normal.  The patient is not having any pain.  Objective:  BP (!) 142/82 (BP Location: Right Arm, Patient Position: Sitting, Cuff Size: Normal)   Pulse 96   Wt 160 lb 12.8 oz (72.9 kg)   BMI 27.60 kg/m  General:Well developed, well nourished.  No acute distress. Abdomen: Bowel sounds normal, soft, non-tender. Pelvic Exam:    External Genitalia:  Normal.Good posterior support    Vagina: Normal normal-appearing surgical lines anterior and at the apex and posteriorly    Cervix: Absent    Uterus surgically absent    Adnexa/Bimanual: Normal no masses  Incision(s):   Healing well, no drainage, no erythema, no hernia, no swelling, no dehiscence,   Discussion: 1. Discussed  cervical biopsy results of "Invasive squamous cell carcinoma, well differentiated" from 7/31 vaginal hysterectomy. Patient aware that we  will need to refer her to GYN oncology for their consultation. I have discussed the case with Dr. Charlaine Dalton GYN oncologist in [place]. Will wait approximately a month for patient to have initial healing from her surgery and then she will be a candidate for probable radiation therapy. Other surgical options such as lymph node sampling will be discussed with patient has COPD that is significant and may increase risks of surgical treatments  2. Smoking cessation discussed with patient due to pt SpO2 levels in office being 87-88%.   At end of discussion, pt had opportunity to ask questions and has no further questions at this time.   Specific discussion of abnormalities from cervical biopsy results of "Invasive squamous cell carcinoma, well differentiated" from 7/31 vaginal hysterectomy. Smoking cessation discussed with patient due to pt SpO2 levels in office being 87-88%. as noted above. Greater than 50% was spent in counseling and coordination of care with the patient.   Total time greater than: 35 minutes.   Assessment:  Post-Op 3 days s/p vaginal hysterectomy cervical cancer invasive stage 1 a 2  Return of normal blood pressures  postoperatively.  Persistent discomfort from tongue Plan:  1.Wound care discussed   2. Current medications. 3. Restart amlodipine and lisinopril Rx. . Will not restart beta blocker at this time. Patient has follow-up appointment with her primary care physician 11/20/2016 4. Keep scheduled appointment with PCP for next Saturday, 8/11 4. Discussed that pt will need to follow up  with ENT specialist Dr. Benjamine Mola for tongue issues 5. Discussed that pt will need to follow up with Oncologist, Dr. Denman George 6. Activity restrictions: none 7. Return to work: not applicable. 8. Follow up in 1 week.   By signing my name below, I, Margit Banda, attest that this documentation has been prepared under the direction and in the presence of Jonnie Kind,  MD. Electronically Signed: Margit Banda, Medical Scribe. 11/12/16. 9:21 AM.  I personally performed the services described in this documentation, which was SCRIBED in my presence. The recorded information has been reviewed and considered accurate. It has been edited as necessary during review. Jonnie Kind, MD

## 2016-11-16 ENCOUNTER — Telehealth: Payer: Self-pay | Admitting: Obstetrics and Gynecology

## 2016-11-16 NOTE — Telephone Encounter (Signed)
Discussion with DR Fermin Schwab reviewed with the patient.

## 2016-11-17 ENCOUNTER — Encounter: Payer: Medicaid Other | Admitting: Obstetrics and Gynecology

## 2016-11-17 ENCOUNTER — Ambulatory Visit (INDEPENDENT_AMBULATORY_CARE_PROVIDER_SITE_OTHER): Payer: Medicaid Other | Admitting: Obstetrics and Gynecology

## 2016-11-17 VITALS — BP 140/80 | HR 88 | Ht 64.0 in | Wt 151.6 lb

## 2016-11-17 DIAGNOSIS — Z9889 Other specified postprocedural states: Secondary | ICD-10-CM

## 2016-11-17 DIAGNOSIS — C530 Malignant neoplasm of endocervix: Secondary | ICD-10-CM

## 2016-11-18 ENCOUNTER — Encounter: Payer: Self-pay | Admitting: Obstetrics and Gynecology

## 2016-11-19 ENCOUNTER — Ambulatory Visit (INDEPENDENT_AMBULATORY_CARE_PROVIDER_SITE_OTHER): Payer: Medicaid Other | Admitting: "Endocrinology

## 2016-11-19 ENCOUNTER — Encounter: Payer: Self-pay | Admitting: "Endocrinology

## 2016-11-19 ENCOUNTER — Other Ambulatory Visit: Payer: Self-pay | Admitting: "Endocrinology

## 2016-11-19 ENCOUNTER — Telehealth: Payer: Self-pay | Admitting: *Deleted

## 2016-11-19 VITALS — BP 161/89 | HR 89 | Ht 64.0 in | Wt 151.0 lb

## 2016-11-19 DIAGNOSIS — E0590 Thyrotoxicosis, unspecified without thyrotoxic crisis or storm: Secondary | ICD-10-CM | POA: Diagnosis not present

## 2016-11-19 LAB — TSH: TSH: 1.96 m[IU]/L

## 2016-11-19 LAB — T4, FREE: Free T4: 1.2 ng/dL (ref 0.8–1.8)

## 2016-11-19 LAB — T3, FREE: T3, Free: 2.6 pg/mL (ref 2.3–4.2)

## 2016-11-19 NOTE — Progress Notes (Signed)
Subjective:    Patient ID: [name], female    DOB: Jul 11, 1955, PCP [name], M.D.   Past Medical History:  Diagnosis Date  . Arthritis    spinal stenosis  . Back disorder    "crooked spine"  . Bulging lumbar disc   . Depression   . H/O degenerative disc disease   . Hypertension   . Hypothyroidism   . Thyroid disease    Past Surgical History:  Procedure Laterality Date  . ANTERIOR AND POSTERIOR REPAIR N/A 11/09/2016   Procedure: ANTERIOR (CYSTOCELE) AND POSTERIOR REPAIR (RECTOCELE);  Surgeon: Jonnie Kind, MD;  Location: [facility];  Service: Gynecology;  Laterality: N/A;  . BILATERAL SALPINGECTOMY Left 11/09/2016   Procedure: LEFT SALPINGECTOMY;  Surgeon: Jonnie Kind, MD;  Location: [facility];  Service: Gynecology;  Laterality: Left;  . CATARACT EXTRACTION W/PHACO Right 07/05/2016   Procedure: CATARACT EXTRACTION PHACO AND INTRAOCULAR LENS PLACEMENT (IOC);  Surgeon: Tonny Branch, MD;  Location: [facility];  Service: Ophthalmology;  Laterality: Right;  CDE: 15.41  . CATARACT EXTRACTION W/PHACO Left 07/19/2016   Procedure: CATARACT EXTRACTION PHACO AND INTRAOCULAR LENS PLACEMENT LEFT EYE CDE= 12.65;  Surgeon: Tonny Branch, MD;  Location: [facility];  Service: Ophthalmology;  Laterality: Left;  left  . SALPINGOOPHORECTOMY Right 11/09/2016   Procedure: RIGHT SALPINGO OOPHORECTOMY;  Surgeon: Jonnie Kind, MD;  Location: [facility];  Service: Gynecology;  Laterality: Right;  . TUBAL LIGATION    . VAGINAL HYSTERECTOMY N/A 11/09/2016   Procedure: HYSTERECTOMY VAGINAL;  Surgeon: Jonnie Kind, MD;  Location: [facility];  Service: Gynecology;  Laterality: N/A;   Social History   Social History  . Marital status: Legally Separated    Spouse name: N/A  . Number of children: N/A  . Years of education: N/A   Social History Main Topics  . Smoking status: Current Every Day Smoker    Packs/day: 1.00    Years: 44.00    Types: Cigarettes  . Smokeless tobacco: Never Used  . Alcohol use No     Comment: 01-06-2016 Per pt rarely, 02-05-2016 per pt no but 61yrs ago    . Drug use: No     Comment: 02-05-2016 per pt no but about 40 yrs ago  . Sexual activity: Not Currently    Birth control/ protection: Surgical     Comment: tubal   Other Topics Concern  . None   Social History Narrative  . None   Outpatient Encounter Prescriptions as of 11/19/2016  Medication Sig  . alendronate (FOSAMAX) 70 MG tablet Take 70 mg by mouth once a week. Take with a full glass of water on an empty stomach. Sundays  . amLODipine (NORVASC) 10 MG tablet Take 10 mg by mouth daily.  Marland Kitchen atorvastatin (LIPITOR) 40 MG tablet Take 40 mg by mouth daily.  . Cholecalciferol (VITAMIN D3) 1000 units CAPS Take 2,000 Units by mouth daily.   Marland Kitchen HYDROcodone-acetaminophen (NORCO/VICODIN) 5-325 MG tablet Take 1 tablet by mouth every 6 (six) hours as needed for moderate pain.  Marland Kitchen lisinopril (PRINIVIL,ZESTRIL) 40 MG tablet Take 40 mg by mouth daily.  . methimazole (TAPAZOLE) 5 MG tablet Take 5 mg by mouth daily.   Marland Kitchen PARoxetine (PAXIL) 40 MG tablet Take 60 mg by mouth daily.   . traZODone (DESYREL) 100 MG tablet Take 200 mg by mouth at bedtime as needed for sleep.   . [DISCONTINUED] ibuprofen (ADVIL,MOTRIN) 600 MG tablet Take 1 tablet (600 mg total) by mouth every  6 (six) hours as needed (mild pain).  . [DISCONTINUED] nystatin (MYCOSTATIN) 100000 UNIT/ML suspension Use as directed 5 mLs (500,000 Units total) in the mouth or throat 4 (four) times daily. FOR  14 Days. Started 10/26/16. To be finished 11/09/16   No facility-administered encounter medications on file as of 11/19/2016.    ALLERGIES: Allergies  Allergen Reactions  . Prednisone     Sick to stomach  . Zithromax [Azithromycin] Other (See Comments)    Blisters in mouth     VACCINATION STATUS:  There is no immunization history on file for this patient.  HPI 61 year old female patient with medical history as above. She is being seen in consultation for  hyperthyroidism requested by [name], M.D. - She reports that she was diagnosed with hyperthyroidism approximately a year ago when she was started on methimazole first on 20 mg daily which was tapered to 5 mg daily currently. - Her referral packages not complete, however on 10/18/2016 she was found to have TSH of 15.06, free T4 0.82- reportedly this is when her methimazole was lowered to 25 mg by mouth daily. She was also treated with propranolol for palpitations until he was stopped for appears to be bradycardia 2 weeks ago. - She reports symptoms including weight loss of 10 pounds, palpitations, tremors, sleep disturbance, fatigue which are largely subsiding. -She denies family history of thyroid dysfunction. She does not have any history of goiter. She does not have family history of thyroid cancer. -She is a chronic active smoker. -She says she underwent lab work last week however did not include thyroid function tests.  Review of Systems  Constitutional: + Fluctuating weight, recent loss of 10 pounds, + fatigue, +subjective hyperthermia. Eyes: no blurry vision, no xerophthalmia ENT: no sore throat, no nodules palpated in throat, no dysphagia/odynophagia, no hoarseness Cardiovascular: no Chest Pain, no Shortness of Breath, no palpitations, no leg swelling Respiratory: + cough, no SOB Gastrointestinal: no Nausea/Vomiting/Diarhhea Musculoskeletal: no muscle/joint aches Skin: no rashes Neurological: + tremors, no numbness, no tingling, no dizziness Psychiatric: + depression, + anxiety  Objective:    BP (!) 161/89   Pulse 89   Ht 5\' 4"  (1.626 m)   Wt 151 lb (68.5 kg)   SpO2 94%   BMI 25.92 kg/m   Wt Readings from Last 3 Encounters:  11/19/16 151 lb (68.5 kg)  11/18/16 151 lb 9.6 oz (68.8 kg)  11/12/16 160 lb 12.8 oz (72.9 kg)    Physical Exam  Constitutional: +  anxious looking,  not in acute distress. Eyes: PERRLA, EOMI, no exophthalmos ENT: moist mucous membranes, no  thyromegaly, no cervical lymphadenopathy Cardiovascular: + Hyperactive  precordial activity, Regular Rate and Rhythm, no Murmur/Rubs/Gallops Respiratory:  adequate breathing efforts, no gross chest deformity, + scattered wheezes and rales  Gastrointestinal: abdomen soft, Non -tender, No distension, Bowel Sounds present Musculoskeletal: no gross deformities, strength intact in all four extremities Skin: moist, warm, no rashes Neurological: + tremor with outstretched hands, Deep tendon reflexes normal in all four extremities.   CMP     Component Value Date/Time   NA 140 11/10/2016 0559   K 4.2 11/10/2016 0559   CL 108 11/10/2016 0559   CO2 27 11/10/2016 0559   GLUCOSE 101 (H) 11/10/2016 0559   BUN 6 11/10/2016 0559   CREATININE 0.92 11/10/2016 0559   CALCIUM 8.2 (L) 11/10/2016 0559   GFRNONAA >60 11/10/2016 0559   GFRAA >60 11/10/2016 0559   10/18/2016 labs showed TSH 15.06, free T4 0.82  Assessment &  Plan:   1. Hyperthyroidism - This patient is being seen at the kind request of [name], M.D. -She was diagnosed with hyperthyroidism a year ago when she was initiated on treatment with methimazole tapering from 20 mg daily to currently of 5 mg daily. She has responded clinically.Thyroid function tests from 11/16/2016 has only TSH at 3.3. -A choice of therapy for her long-term thyroid control would probably be radioactive iodine ablation instead of methimazole. - In preparation for that, she needs a new set of thyroid function tests. - If her thyroid is adequately controlled, methimazole would be discontinued and she will have thyroid uptake and scan. If that thyroid still display significant activity, she will be offered therapy with I-131. -She will return in 1 week with the following labs: - Thyroid peroxidase antibody - Thyroglobulin antibody - TSH - T4, free - T3, free The potential risks of untreated thyrotoxicosis and the need for definitive therapy have been discussed in  detail with the patient, and the patient agrees to proceed with plan.  - 45 minutes of time was spent on the care of this patient , 50% of which was applied for counseling on complications of thyrotoxicosis, options ,  and the need for definitive therapy to prevent these complications.  - I advised patient to maintain close follow up with [name], MD for primary care needs. Follow up plan: Return in about 1 week (around 11/26/2016) for labs today.  Glade Lloyd, MD Phone: [phone]  Fax: [phone]   11/19/2016, 9:15 AM

## 2016-11-19 NOTE — Telephone Encounter (Signed)
Pt called stating that Dr Glo Herring sent step 2 of the patch to her pharmacy and she needed step 1 first. Advised pt that he would be back in the office on Monday and I would check with him then. Pt verbalized understanding.

## 2016-11-22 ENCOUNTER — Telehealth: Payer: Self-pay | Admitting: *Deleted

## 2016-11-22 ENCOUNTER — Telehealth: Payer: Self-pay | Admitting: Obstetrics and Gynecology

## 2016-11-22 LAB — THYROGLOBULIN ANTIBODY

## 2016-11-22 LAB — THYROID PEROXIDASE ANTIBODY

## 2016-11-22 NOTE — Telephone Encounter (Signed)
Patient called stating that Dr. Glo Herring has prescribed her a medication for her to stop smoking and it was patches. When pt picked them up the were step two. Pt states that she would like Chantax called in she has taken that before and it helped. Please contact pt

## 2016-11-22 NOTE — Telephone Encounter (Signed)
Notified pharmacy that prescription for nicoderm patch should be 21 mg instead of 14mg . Pt called on Friday needing step 1 instead of step 2.

## 2016-11-22 NOTE — Progress Notes (Signed)
   Subjective:  [name] is a 61 y.o. female now 1 weeks status post vaginal hysterectomy. Pt spoke with Dr. Glo Herring on the phone on 8/7/8 to discuss post/op and cervical cancer pathology. Pt reports her SPO2 levels are increasing, per today's vitals it was 96%, and she would like to use a nicotine patch.    Review of Systems Negative   Diet:   negative   Bowel movements : normal.  The patient is not having any pain.  Objective:  BP 140/80 (BP Location: Right Arm, Patient Position: Sitting, Cuff Size: Normal)   Pulse 88   Ht 5\' 4"  (1.626 m)   Wt 151 lb 9.6 oz (68.8 kg)   SpO2 96%   BMI 26.02 kg/m  General:Well developed, well nourished.  No acute distress. Abdomen: not indicated Pelvic Exam: not indicated  Incision(s):   Not indicated   Note: No physical exam necessary per Dr. 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 . current medications: Rx nicoderm patch, continue amlodipine and lisinopril 3. Activity restrictions: none 4. return to work: not applicable . 5. Follow up in 3 weeks.    By signing my name below, I, Izna Ahmed, attest that this documentation has been prepared under the direction and in the presence of Jonnie Kind, MD. Electronically Signed: Jabier Gauss, Medical Scribe. 11/22/16. 1:11 PM.  I personally performed the services described in this documentation, which was SCRIBED in my presence. The recorded information has been reviewed and considered accurate. It has been edited as necessary during review. Jonnie Kind, MD

## 2016-12-02 ENCOUNTER — Ambulatory Visit (INDEPENDENT_AMBULATORY_CARE_PROVIDER_SITE_OTHER): Payer: Medicaid Other | Admitting: "Endocrinology

## 2016-12-02 ENCOUNTER — Encounter: Payer: Self-pay | Admitting: "Endocrinology

## 2016-12-02 VITALS — BP 116/76 | HR 106 | Ht 64.0 in | Wt 154.0 lb

## 2016-12-02 DIAGNOSIS — E0590 Thyrotoxicosis, unspecified without thyrotoxic crisis or storm: Secondary | ICD-10-CM

## 2016-12-02 NOTE — Progress Notes (Signed)
Subjective:    Patient ID: [name], female    DOB: Feb 28, 1956, PCP [name], M.D.   Past Medical History:  Diagnosis Date  . Arthritis    spinal stenosis  . Back disorder    "crooked spine"  . Bulging lumbar disc   . Depression   . H/O degenerative disc disease   . Hypertension   . Hypothyroidism   . Thyroid disease    Past Surgical History:  Procedure Laterality Date  . ANTERIOR AND POSTERIOR REPAIR N/A 11/09/2016   Procedure: ANTERIOR (CYSTOCELE) AND POSTERIOR REPAIR (RECTOCELE);  Surgeon: Jonnie Kind, MD;  Location: [facility];  Service: Gynecology;  Laterality: N/A;  . BILATERAL SALPINGECTOMY Left 11/09/2016   Procedure: LEFT SALPINGECTOMY;  Surgeon: Jonnie Kind, MD;  Location: [facility];  Service: Gynecology;  Laterality: Left;  . CATARACT EXTRACTION W/PHACO Right 07/05/2016   Procedure: CATARACT EXTRACTION PHACO AND INTRAOCULAR LENS PLACEMENT (IOC);  Surgeon: Tonny Branch, MD;  Location: [facility];  Service: Ophthalmology;  Laterality: Right;  CDE: 15.41  . CATARACT EXTRACTION W/PHACO Left 07/19/2016   Procedure: CATARACT EXTRACTION PHACO AND INTRAOCULAR LENS PLACEMENT LEFT EYE CDE= 12.65;  Surgeon: Tonny Branch, MD;  Location: [facility];  Service: Ophthalmology;  Laterality: Left;  left  . SALPINGOOPHORECTOMY Right 11/09/2016   Procedure: RIGHT SALPINGO OOPHORECTOMY;  Surgeon: Jonnie Kind, MD;  Location: [facility];  Service: Gynecology;  Laterality: Right;  . TUBAL LIGATION    . VAGINAL HYSTERECTOMY N/A 11/09/2016   Procedure: HYSTERECTOMY VAGINAL;  Surgeon: Jonnie Kind, MD;  Location: [facility];  Service: Gynecology;  Laterality: N/A;   Social History   Social History  . Marital status: Legally Separated    Spouse name: N/A  . Number of children: N/A  . Years of education: N/A   Social History Main Topics  . Smoking status: Current Every Day Smoker    Packs/day: 1.00    Years: 44.00    Types: Cigarettes  . Smokeless tobacco: Never Used  . Alcohol use No      Comment: 01-06-2016 Per pt rarely, 02-05-2016 per pt no but 76yrs ago    . Drug use: No     Comment: 02-05-2016 per pt no but about 40 yrs ago  . Sexual activity: Not Currently    Birth control/ protection: Surgical     Comment: tubal   Other Topics Concern  . None   Social History Narrative  . None   Outpatient Encounter Prescriptions as of 12/02/2016  Medication Sig  . alendronate (FOSAMAX) 70 MG tablet Take 70 mg by mouth once a week. Take with a full glass of water on an empty stomach. Sundays  . amLODipine (NORVASC) 10 MG tablet Take 10 mg by mouth daily.  Marland Kitchen atorvastatin (LIPITOR) 40 MG tablet Take 40 mg by mouth daily.  . Cholecalciferol (VITAMIN D3) 1000 units CAPS Take 2,000 Units by mouth daily.   Marland Kitchen HYDROcodone-acetaminophen (NORCO/VICODIN) 5-325 MG tablet Take 1 tablet by mouth every 6 (six) hours as needed for moderate pain.  Marland Kitchen lisinopril (PRINIVIL,ZESTRIL) 40 MG tablet Take 40 mg by mouth daily.  Marland Kitchen PARoxetine (PAXIL) 40 MG tablet Take 60 mg by mouth daily.   . traZODone (DESYREL) 100 MG tablet Take 200 mg by mouth at bedtime as needed for sleep.   . [DISCONTINUED] methimazole (TAPAZOLE) 5 MG tablet Take 5 mg by mouth daily.    No facility-administered encounter medications on file as of 12/02/2016.    ALLERGIES:  Allergies  Allergen Reactions  . Prednisone     Sick to stomach  . Zithromax [Azithromycin] Other (See Comments)    Blisters in mouth     VACCINATION STATUS:  There is no immunization history on file for this patient.  HPI 61 year old female patient with medical history as above. She is being seen in f/u for  hyperthyroidism with repeat thyroid function tests.   - She reports that she was diagnosed with hyperthyroidism approximately a year ago when she was started on methimazole first on 20 mg daily which was tapered to 5 mg daily currently. - Her thyroid function test have been fluctuating over the year. - She reports symptoms including fluctuating  weight , palpitations, tremors, sleep disturbance, fatigue which are largely subsiding. -She denies family history of thyroid dysfunction. She does not have any history of goiter. She does not have family history of thyroid cancer. -She is a chronic active smoker.  Review of Systems  Constitutional: + gaining weight,  + fatigue, +subjective hyperthermia. Eyes: no blurry vision, no xerophthalmia ENT: no sore throat, no nodules palpated in throat, no dysphagia/odynophagia, no hoarseness Cardiovascular: no Chest Pain, no Shortness of Breath, no palpitations, no leg swelling Respiratory: + cough, no SOB Gastrointestinal: no Nausea/Vomiting/Diarhhea Musculoskeletal: no muscle/joint aches Skin: no rashes Neurological: + tremors, no numbness, no tingling, no dizziness Psychiatric: + depression, + anxiety  Objective:    BP 116/76   Pulse (!) 106   Ht 5\' 4"  (1.626 m)   Wt 154 lb (69.9 kg)   BMI 26.43 kg/m   Wt Readings from Last 3 Encounters:  12/02/16 154 lb (69.9 kg)  11/19/16 151 lb (68.5 kg)  11/18/16 151 lb 9.6 oz (68.8 kg)    Physical Exam  Constitutional: More steady state of mind,  not in acute distress. Eyes: PERRLA, EOMI, no exophthalmos ENT: moist mucous membranes, no thyromegaly, no cervical lymphadenopathy Cardiovascular: + Hyperactive  precordial activity, + mild tachycardia , o Murmur/Rubs/Gallops Respiratory:  adequate breathing efforts, no gross chest deformity, + scattered wheezes and rales  Gastrointestinal: abdomen soft, Non -tender, No distension, Bowel Sounds present Musculoskeletal: no gross deformities, strength intact in all four extremities Skin: moist, warm, no rashes Neurological: + tremor with outstretched hands, Deep tendon reflexes normal in all four extremities.   CMP     Component Value Date/Time   NA 140 11/10/2016 0559   K 4.2 11/10/2016 0559   CL 108 11/10/2016 0559   CO2 27 11/10/2016 0559   GLUCOSE 101 (H) 11/10/2016 0559   BUN 6  11/10/2016 0559   CREATININE 0.92 11/10/2016 0559   CALCIUM 8.2 (L) 11/10/2016 0559   GFRNONAA >60 11/10/2016 0559   GFRAA >60 11/10/2016 0559   Results for LAVITA, PONTIUS (MRN [id number]) as of 12/02/2016 15:13  Ref. Range 11/19/2016 09:32  TSH Latest Units: mIU/L 1.96  Triiodothyronine,Free,Serum Latest Ref Range: 2.3 - 4.2 pg/mL 2.6  T4,Free(Direct) Latest Ref Range: 0.8 - 1.8 ng/dL 1.2  Thyroglobulin Ab Latest Ref Range: <2 IU/mL <1  Thyroperoxidase Ab SerPl-aCnc Latest Ref Range: <9 IU/mL <1    10/18/2016 labs showed TSH 15.06, free T4 0.82  Assessment & Plan:   1. Hyperthyroidism - Her repeat thyroid function tests are consistent with adequate control/treatment -She was diagnosed with hyperthyroidism a year ago when she was treated with methimazole, tapering from 20 mg daily to currently of 5 mg daily. She has responded clinically and biochemically.  -A  a good choice of long-term therapy for hyperthyroidism  would be radioactive iodine ablation instead of methimazole. - I advised her to discontinue methimazole therapy at this time.  -She will have repeat thyroid function tests with free T4 and TSH and return in 3 months for reevaluation. - If she has any relapse of hyperthyroidism, she will be considered for thyroid uptake and scan prior to I-131 thyroid ablation. - On physical exam, she does not have clinical goiter, no need for thyroid imaging at this time. Chronic heavy smoking: She is extensively counseled against smoking.  - I advised patient to maintain close follow up with [name], MD for primary care needs. Follow up plan: Return in about 3 months (around 03/04/2017) for follow up with pre-visit labs.  Glade Lloyd, MD Phone: [phone]  Fax: [phone]  This note was partially dictated with voice recognition software. Similar sounding words can be transcribed inadequately or may not  be corrected upon review.  12/02/2016, 3:19 PM

## 2016-12-09 ENCOUNTER — Ambulatory Visit (INDEPENDENT_AMBULATORY_CARE_PROVIDER_SITE_OTHER): Payer: Medicaid Other | Admitting: Obstetrics and Gynecology

## 2016-12-09 ENCOUNTER — Encounter: Payer: Self-pay | Admitting: Obstetrics and Gynecology

## 2016-12-09 ENCOUNTER — Telehealth: Payer: Self-pay | Admitting: *Deleted

## 2016-12-09 VITALS — BP 132/78 | HR 90 | Ht 64.0 in | Wt 155.6 lb

## 2016-12-09 DIAGNOSIS — Z90710 Acquired absence of both cervix and uterus: Secondary | ICD-10-CM

## 2016-12-09 DIAGNOSIS — Z9889 Other specified postprocedural states: Secondary | ICD-10-CM

## 2016-12-09 DIAGNOSIS — C530 Malignant neoplasm of endocervix: Secondary | ICD-10-CM

## 2016-12-09 MED ORDER — ESTROGENS, CONJUGATED 0.625 MG/GM VA CREA: 1.0000 | TOPICAL_CREAM | VAGINAL | 2 refills | Status: DC

## 2016-12-09 NOTE — Telephone Encounter (Signed)
Contacted the patient and gave the date/time of the new patient appt. Appt on September 10th at 11:30am

## 2016-12-09 NOTE — Progress Notes (Signed)
Patient ID: [name], female   DOB: 03-08-1956, 61 y.o.   MRN: [id number] Subjective:  [name] is a 61 y.o. female now 4 weeks 2 days status post vaginal hysterectomy. Pt reports that she has had intermittent right suprapubic pain over mons pubis on right. She notes that she has had a decrease in vaginal discharge s/p surgery. Pt reports that she has had worn a smoking patch and has decreased to 3 cigarettes a day.     Review of Systems Negative except intermittent right suprapubic pain over mons pubis.   Diet:     Bowel movements : normal.  The patient is not having any pain.  Objective:  BP 132/78 (BP Location: Right Arm, Patient Position: Sitting, Cuff Size: Normal)    Pulse 90    Ht 5\' 4"  (1.626 m)    Wt 155 lb 9.6 oz (70.6 kg)    BMI 26.71 kg/m  General:Well developed, well nourished.  No acute distress. Abdomen: Bowel sounds normal, soft, non-tender. Pelvic Exam:    External Genitalia:  Normal.    Vagina: Small band about 3 cm up left vaginal wall where pt healed from the anterior repair. Vaginal apex good. Back wall good.    Cervix: surgically absent    Uterus: surgically absent    Adnexa/Bimanual: Normal   Incision(s):   Healing well, no drainage, no erythema, no hernia, no swelling, no dehiscence,  Assessment:  Post-Op 4 weeks 2 days s/p vaginal hysterectomy  Endocervix cancer of cervix, T1a2 N0 Doing well postoperatively.   Plan:  1.Wound care discussed  2. Will prescribe premarin vaginal cream 3. Activity restrictions: desk job only 4. return to work: not applicable. 5. Referral to Dr. Denman [facility] Oncology Gynecologist, for ?PET scan and consider radiation therapy to pelvis.   6. Follow up in 4 weeks. [facility].  By signing my name below, I, Soijett Blue, attest that this documentation has been prepared under the direction and in the presence of Jonnie Kind, MD. Electronically Signed: Soijett Blue, ED Scribe. 12/09/16. 11:06 AM.  I personally  performed the services described in this documentation, which was SCRIBED in my presence. The recorded information has been reviewed and considered accurate. It has been edited as necessary during review. Jonnie Kind, MD

## 2016-12-20 ENCOUNTER — Encounter: Payer: Self-pay | Admitting: Gynecologic Oncology

## 2016-12-20 ENCOUNTER — Encounter: Payer: Self-pay | Admitting: Radiation Oncology

## 2016-12-20 ENCOUNTER — Ambulatory Visit: Payer: Medicaid Other | Attending: Gynecologic Oncology | Admitting: Gynecologic Oncology

## 2016-12-20 VITALS — BP 141/73 | HR 78 | Temp 98.1°F | Resp 18 | Ht 64.0 in | Wt 152.8 lb

## 2016-12-20 DIAGNOSIS — F17210 Nicotine dependence, cigarettes, uncomplicated: Secondary | ICD-10-CM | POA: Insufficient documentation

## 2016-12-20 DIAGNOSIS — Z9889 Other specified postprocedural states: Secondary | ICD-10-CM | POA: Insufficient documentation

## 2016-12-20 DIAGNOSIS — C539 Malignant neoplasm of cervix uteri, unspecified: Secondary | ICD-10-CM | POA: Insufficient documentation

## 2016-12-20 DIAGNOSIS — Z9851 Tubal ligation status: Secondary | ICD-10-CM | POA: Insufficient documentation

## 2016-12-20 DIAGNOSIS — I10 Essential (primary) hypertension: Secondary | ICD-10-CM | POA: Diagnosis not present

## 2016-12-20 DIAGNOSIS — Z79899 Other long term (current) drug therapy: Secondary | ICD-10-CM | POA: Insufficient documentation

## 2016-12-20 DIAGNOSIS — Z90710 Acquired absence of both cervix and uterus: Secondary | ICD-10-CM | POA: Insufficient documentation

## 2016-12-20 DIAGNOSIS — Z7189 Other specified counseling: Secondary | ICD-10-CM | POA: Diagnosis not present

## 2016-12-20 NOTE — Progress Notes (Signed)
Consult Note: Gyn-Onc  Consult was requested by Dr. Glo Herring for the evaluation of [name] 61 y.o. female  CC:  Chief Complaint  Patient presents with  . Cervical Cancer    Assessment/Plan:  [name]  is a 61 y.o.  year old with stage IA 2 cervical cancer s/p extrafascial vaginal hysterectomy with her OBGYN provider.  I discussed with [name] that while the margins were negative on her specimen, she did not have the recommended degree of parametrial resection and therefore, given the close (33mm) deep cervical margin, there is an increased risk for recurrence at the vagina. She is also at risk for lymph node positivity.   I am recommending PET/CT, minimally invasive pelvic lymphadenectomy and vaginal brachytherapy (+6 weeks post procedure)  HPI: [name] is a 61 year old parous woman who is seen in consultation at the request of Dr Glo Herring for cervical cancer.  The patient has a remote history of abnormal pap smears. She had an excisional procedure approximately 30 years ago. She had not had a pap smear since approximately 2005. On 09/08/16 a pap showed ASC-H.  She underwent colposcopy with directed biopsies in June, 2018. 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 She then underwent a vaginal hysterectomy with AP repair on 11/09/16 with Dr Glo Herring which revealed squamous cell carcinoma, 54mm maximal dimension, 74mm depth of invasion, no LVSI. The margins were negative however the deep cervical margin was 42mm.   Postop she did well. With no complaints.  The patient is a smoker with hypertension. She has only had a tubal ligation on her abdomen.  Current Meds:  Outpatient Encounter Prescriptions as of 12/20/2016  Medication Sig  . alendronate (FOSAMAX) 70 MG tablet Take 70 mg by mouth once a week. Take with a full glass of water on an empty stomach. Sundays  . amLODipine (NORVASC) 10 MG tablet Take 10 mg  by mouth daily.  Marland Kitchen atorvastatin (LIPITOR) 40 MG tablet Take 40 mg by mouth daily.  . Cholecalciferol (VITAMIN D3) 1000 units CAPS Take 2,000 Units by mouth daily.   Marland Kitchen conjugated estrogens (PREMARIN) vaginal cream Place 1 Applicatorful vaginally 3 (three) times a week. For vaginal thinning and irritation 0.5 gram= 1/4 applicator  . HYDROcodone-acetaminophen (NORCO/VICODIN) 5-325 MG tablet Take 1 tablet by mouth every 6 (six) hours as needed for moderate pain.  Marland Kitchen lisinopril (PRINIVIL,ZESTRIL) 40 MG tablet Take 40 mg by mouth daily.  Marland Kitchen PARoxetine (PAXIL) 40 MG tablet Take 40 mg by mouth daily.   Marland Kitchen thiamine (VITAMIN B-1) 100 MG tablet Take 100 mg by mouth daily.  . traZODone (DESYREL) 100 MG tablet Take 200 mg by mouth at bedtime as needed for sleep.   Marland Kitchen UNABLE TO FIND Smoking patch-changes every 24 hours   No facility-administered encounter medications on file as of 12/20/2016.     Allergy:  Allergies  Allergen Reactions  . Prednisone     Sick to stomach  . Zithromax [Azithromycin] Other (See Comments)    Blisters in mouth     Social Hx:   Social History   Social History  . Marital status: Legally Separated    Spouse name: N/A  . Number of children: N/A  . Years of education: N/A   Occupational History  . Not on file.   Social History Main Topics  . Smoking status: Current Every Day Smoker    Packs/day: 0.00    Years: 44.00  Types: Cigarettes  . Smokeless tobacco: Never Used     Comment: smokes 3 cig daily  . Alcohol use No     Comment: 01-06-2016 Per pt rarely, 02-05-2016 per pt no but 64yrs ago    . Drug use: No     Comment: 02-05-2016 per pt no but about 40 yrs ago  . Sexual activity: Not Currently    Birth control/ protection: Surgical     Comment: hyst   Other Topics Concern  . Not on file   Social History Narrative  . No narrative on file    Past Surgical Hx:  Past Surgical History:  Procedure Laterality Date  . ANTERIOR AND POSTERIOR REPAIR N/A  11/09/2016   Procedure: ANTERIOR (CYSTOCELE) AND POSTERIOR REPAIR (RECTOCELE);  Surgeon: Jonnie Kind, MD;  Location: [facility];  Service: Gynecology;  Laterality: N/A;  . BILATERAL SALPINGECTOMY Left 11/09/2016   Procedure: LEFT SALPINGECTOMY;  Surgeon: Jonnie Kind, MD;  Location: [facility];  Service: Gynecology;  Laterality: Left;  . CATARACT EXTRACTION W/PHACO Right 07/05/2016   Procedure: CATARACT EXTRACTION PHACO AND INTRAOCULAR LENS PLACEMENT (IOC);  Surgeon: Tonny Branch, MD;  Location: [facility];  Service: Ophthalmology;  Laterality: Right;  CDE: 15.41  . CATARACT EXTRACTION W/PHACO Left 07/19/2016   Procedure: CATARACT EXTRACTION PHACO AND INTRAOCULAR LENS PLACEMENT LEFT EYE CDE= 12.65;  Surgeon: Tonny Branch, MD;  Location: [facility];  Service: Ophthalmology;  Laterality: Left;  left  . SALPINGOOPHORECTOMY Right 11/09/2016   Procedure: RIGHT SALPINGO OOPHORECTOMY;  Surgeon: Jonnie Kind, MD;  Location: [facility];  Service: Gynecology;  Laterality: Right;  . TUBAL LIGATION    . VAGINAL HYSTERECTOMY N/A 11/09/2016   Procedure: HYSTERECTOMY VAGINAL;  Surgeon: Jonnie Kind, MD;  Location: [facility];  Service: Gynecology;  Laterality: N/A;    Past Medical Hx:  Past Medical History:  Diagnosis Date  . Arthritis    spinal stenosis  . Back disorder    "crooked spine"  . Bulging lumbar disc   . Depression   . H/O degenerative disc disease   . Hypertension   . Hypothyroidism   . Thyroid disease     Past Gynecological History:  Abnormal pap smears. No LMP recorded. Patient is postmenopausal.  Family Hx:  Family History  Problem Relation Age of Onset  . Hypertension Mother   . Congenital heart disease Mother   . Diabetes Mother   . Thyroid disease Brother   . Other Daughter        bowel issues    Review of Systems:  Constitutional  Feels well,    ENT Normal appearing ears and nares bilaterally Skin/Breast  No rash, sores, jaundice, itching, dryness Cardiovascular  No chest pain,  shortness of breath, or edema  Pulmonary  No cough or wheeze.  Gastro Intestinal  No nausea, vomitting, or diarrhoea. No bright red blood per rectum, no abdominal pain, change in bowel movement, or constipation.  Genito Urinary  No frequency, urgency, dysuria,  Musculo Skeletal  No myalgia, arthralgia, joint swelling or pain  Neurologic  No weakness, numbness, change in gait,  Psychology  No depression, anxiety, insomnia.   Vitals:  Blood pressure (!) 141/73, pulse 78, temperature 98.1 F (36.7 C), temperature source Oral, resp. rate 18, height 5\' 4"  (1.626 m), weight 152 lb 12.8 oz (69.3 kg), SpO2 98 %.  Physical Exam: WD in NAD Neck  Supple NROM, without any enlargements.  Lymph Node Survey No cervical supraclavicular or inguinal adenopathy Cardiovascular  Pulse  normal rate, regularity and rhythm. S1 and S2 normal.  Lungs  Clear to auscultation bilateraly, without wheezes/crackles/rhonchi. Good air movement.  Skin  No rash/lesions/breakdown  Psychiatry  Alert and oriented to person, place, and time  Abdomen  Normoactive bowel sounds, abdomen soft, non-tender and nonobese without evidence of hernia.  Back No CVA tenderness Genito Urinary  Vulva/vagina: Normal external female genitalia.  No lesions. No discharge or bleeding.  Bladder/urethra:  No lesions or masses, well supported bladder  Vagina: suture material seen at cuff. Otherwise healing normally. No visible lesions or masses.  Adnexa: no palpable masses. Rectal  deferred Extremities  No bilateral cyanosis, clubbing or edema.   Donaciano Eva, MD  12/20/2016, 1:14 PM

## 2016-12-20 NOTE — Patient Instructions (Addendum)
Plan to have PET scan and we will call you with the results.  Nothing to eat or drink 8 hours before this test.  We will also arrange for you to meet with Dr. Gery Schroeder in Radiation Oncology after surgery.                Preparing for your Surgery  Plan for surgery on January 11, 2017 with Dr. Everitt Schroeder at [facility] will be scheduled for a robotic bilateral pelvic lymphadenectomy.  Pre-operative Testing -You will receive a phone call from presurgical testing at [facility] to arrange for a pre-operative testing appointment before your surgery.  This appointment normally occurs one to two weeks before your scheduled surgery.   -Bring your insurance card, copy of an advanced directive if applicable, medication list  -At that visit, you will be asked to sign a consent for a possible blood transfusion in case a transfusion becomes necessary during surgery.  The need for a blood transfusion is rare but having consent is a necessary part of your care.     -You should not be taking blood thinners or aspirin at least ten days prior to surgery unless instructed by your surgeon.  Day Before Surgery at [place] will be asked to take in a light diet the day before surgery.  Avoid carbonated beverages.  You will be advised to have nothing to eat or drink after midnight the evening before.     Eat a light diet the day before surgery.  Examples including soups, broths, toast, yogurt, mashed potatoes.  Things to avoid include carbonated beverages (fizzy beverages), raw fruits and raw vegetables, or beans.    If your bowels are filled with gas, your surgeon will have difficulty visualizing your pelvic organs which increases your surgical risks.  Your role in recovery Your role is to become active as soon as directed by your doctor, while still giving yourself time to heal.  Rest when you feel tired. You will be asked to do the following in order to speed your recovery:  - Cough  and breathe deeply. This helps toclear and expand your lungs and can prevent pneumonia. You may be given a spirometer to practice deep breathing. A staff member will show you how to use the spirometer. - Do mild physical activity. Walking or moving your legs help your circulation and body functions return to normal. A staff member will help you when you try to walk and will provide you with simple exercises. Do not try to get up or walk alone the first time. - Actively manage your pain. Managing your pain lets you move in comfort. We will ask you to rate your pain on a scale of zero to 10. It is your responsibility to tell your doctor or nurse where and how much you hurt so your pain can be treated.  Special Considerations -If you are diabetic, you may be placed on insulin after surgery to have closer control over your blood sugars to promote healing and recovery.  This does not mean that you will be discharged on insulin.  If applicable, your oral antidiabetics will be resumed when you are tolerating a solid diet.  -Your final pathology results from surgery should be available by the Friday after surgery and the results will be relayed to you when available.   Blood Transfusion Information WHAT IS A BLOOD TRANSFUSION? A transfusion is the replacement of blood or some of its parts. Blood  is made up of multiple cells which provide different functions.  Red blood cells carry oxygen and are used for blood loss replacement.  White blood cells fight against infection.  Platelets control bleeding.  Plasma helps clot blood.  Other blood products are available for specialized needs, such as hemophilia or other clotting disorders. BEFORE THE TRANSFUSION  Who gives blood for transfusions?   You may be able to donate blood to be used at a later date on yourself (autologous donation).  Relatives can be asked to donate blood. This is generally not any safer than if you have received blood from a  stranger. The same precautions are taken to ensure safety when a relative's blood is donated.  Healthy volunteers who are fully evaluated to make sure their blood is safe. This is blood bank blood. Transfusion therapy is the safest it has ever been in the practice of medicine. Before blood is taken from a donor, a complete history is taken to make sure that person has no history of diseases nor engages in risky social behavior (examples are intravenous drug use or sexual activity with multiple partners). The donor's travel history is screened to minimize risk of transmitting infections, such as malaria. The donated blood is tested for signs of infectious diseases, such as HIV and hepatitis. The blood is then tested to be sure it is compatible with you in order to minimize the chance of a transfusion reaction. If you or a relative donates blood, this is often done in anticipation of surgery and is not appropriate for emergency situations. It takes many days to process the donated blood. RISKS AND COMPLICATIONS Although transfusion therapy is very safe and saves many lives, the main dangers of transfusion include:   Getting an infectious disease.  Developing a transfusion reaction. This is an allergic reaction to something in the blood you were given. Every precaution is taken to prevent this. The decision to have a blood transfusion has been considered carefully by your caregiver before blood is given. Blood is not given unless the benefits outweigh the risks.

## 2016-12-27 ENCOUNTER — Telehealth: Payer: Self-pay | Admitting: Gynecologic Oncology

## 2016-12-27 ENCOUNTER — Other Ambulatory Visit: Payer: Self-pay | Admitting: Gynecologic Oncology

## 2016-12-27 DIAGNOSIS — C539 Malignant neoplasm of cervix uteri, unspecified: Secondary | ICD-10-CM

## 2016-12-27 NOTE — Telephone Encounter (Signed)
Spoke with the patient about insurance not covering PET scan.  CT AP ordered per Dr. Denman George.  Spoke with patient about instructions.  She is going to go to [facility] to pick up the contrast for her scan on Thurs at [facility].  All questions answered.  Spoke with [facility] in Radiology at [facility] and confirmed that patient could pick up her contrast there.  Patient advised to call for any questions or concerns.

## 2016-12-29 ENCOUNTER — Other Ambulatory Visit: Payer: Self-pay | Admitting: Gynecologic Oncology

## 2016-12-29 DIAGNOSIS — C539 Malignant neoplasm of cervix uteri, unspecified: Secondary | ICD-10-CM

## 2016-12-30 ENCOUNTER — Ambulatory Visit (HOSPITAL_COMMUNITY)
Admission: RE | Admit: 2016-12-30 | Discharge: 2016-12-30 | Disposition: A | Discharge status: Home / Self Care | Payer: Medicaid Other | Source: Ambulatory Visit | Attending: Gynecologic Oncology | Admitting: Gynecologic Oncology

## 2016-12-30 ENCOUNTER — Other Ambulatory Visit (HOSPITAL_BASED_OUTPATIENT_CLINIC_OR_DEPARTMENT_OTHER): Payer: Medicaid Other

## 2016-12-30 ENCOUNTER — Ambulatory Visit (HOSPITAL_COMMUNITY): Payer: Medicaid Other

## 2016-12-30 DIAGNOSIS — N200 Calculus of kidney: Secondary | ICD-10-CM | POA: Diagnosis not present

## 2016-12-30 DIAGNOSIS — K449 Diaphragmatic hernia without obstruction or gangrene: Secondary | ICD-10-CM | POA: Insufficient documentation

## 2016-12-30 DIAGNOSIS — C539 Malignant neoplasm of cervix uteri, unspecified: Secondary | ICD-10-CM | POA: Diagnosis present

## 2016-12-30 DIAGNOSIS — N329 Bladder disorder, unspecified: Secondary | ICD-10-CM | POA: Insufficient documentation

## 2016-12-30 DIAGNOSIS — Z90710 Acquired absence of both cervix and uterus: Secondary | ICD-10-CM | POA: Diagnosis not present

## 2016-12-30 DIAGNOSIS — I700 Atherosclerosis of aorta: Secondary | ICD-10-CM | POA: Insufficient documentation

## 2016-12-30 LAB — BASIC METABOLIC PANEL
Anion Gap: 9 mEq/L (ref 3–11)
BUN: 5.3 mg/dL — ABNORMAL LOW (ref 7.0–26.0)
CALCIUM: 10.1 mg/dL (ref 8.4–10.4)
CHLORIDE: 104 meq/L (ref 98–109)
CO2: 28 mEq/L (ref 22–29)
Creatinine: 0.9 mg/dL (ref 0.6–1.1)
EGFR: 68 mL/min/{1.73_m2} — AB (ref >90)
GLUCOSE: 97 mg/dL (ref 70–140)
Potassium: 4.5 mEq/L (ref 3.5–5.1)
Sodium: 141 mEq/L (ref 136–145)

## 2016-12-30 MED ORDER — IOPAMIDOL (ISOVUE-300) INJECTION 61%
100.0000 mL | Freq: Once | INTRAVENOUS | Status: AC | PRN
  Administered 2016-12-30: 100 mL via INTRAVENOUS

## 2016-12-30 MED ORDER — IOPAMIDOL (ISOVUE-300) INJECTION 61%
INTRAVENOUS | Status: AC
  Filled 2016-12-30: qty 100

## 2016-12-31 ENCOUNTER — Telehealth: Payer: Self-pay | Admitting: Gynecologic Oncology

## 2016-12-31 ENCOUNTER — Telehealth: Payer: Self-pay | Admitting: *Deleted

## 2016-12-31 ENCOUNTER — Other Ambulatory Visit: Payer: Self-pay | Admitting: Gynecologic Oncology

## 2016-12-31 DIAGNOSIS — C539 Malignant neoplasm of cervix uteri, unspecified: Secondary | ICD-10-CM

## 2016-12-31 NOTE — Telephone Encounter (Signed)
Attempted to contact the patient with her PET scan appt, no answer. LMOM with appt date/time and instructions. (PET scan September 29th at 8:30am, arrive at 8am, nothing to eat/drink after midnight)

## 2016-12-31 NOTE — Telephone Encounter (Signed)
Patient informed of CT scan results.  Advised of Dr. Serita Grit recommendations to proceed with a PET scan prior to surgery.  Advised to call for any needs.

## 2017-01-03 ENCOUNTER — Encounter: Payer: Self-pay | Admitting: Gynecologic Oncology

## 2017-01-03 NOTE — Progress Notes (Signed)
Gynecologic Oncology Multi-Disciplinary Disposition Conference Note  Date of the Conference: January 03, 2017  Patient Name: [name]  Referring Provider: Dr. Glo Herring Primary GYN Oncologist: Dr. [name]  Stage/Disposition:  Stage IA2 cervical carcinoma. Disposition is to proceed with robotic assisted lymphadenectomy on January 11, 2017 with Dr. [name].  If lymph nodes positive for metastatic disease, plan for chemoradiation.   This Multidisciplinary conference took place involving physicians from [place], [place], Radiation Oncology, Pathology, Radiology along with the Gynecologic Oncology Nurse Practitioner and RN.  Comprehensive assessment of the patient's malignancy, staging, need for surgery, chemotherapy, radiation therapy, and need for further testing were reviewed. Supportive measures, both inpatient and following discharge were also discussed. The recommended plan of care is documented. Greater than 35 minutes were spent correlating and coordinating this patient's care.

## 2017-01-04 NOTE — Patient Instructions (Addendum)
[name]  01/04/2017   Your procedure is scheduled on: 01/16/2017    Report to [facility] Main  Entrance Take [place]  elevators to 3rd floor to  [place] at  [place] AM.    Call this number if you have problems the morning of surgery [phone]    Remember: ONLY 1 PERSON MAY GO WITH YOU TO SHORT STAY TO GET  READY MORNING OF [name].  Do not eat food or drink liquids :After Midnight.             Eat a light diet the day before surgery.  Examples include: soups , tost, broths, yogurt and mashed potatoes.  Things to avoid include, carbonated beverages, raw fruits and vegetables and beans.       Take these medicines the morning of surgery with A SIP OF WATER: Amlodipine ( NOrvasc), paxil                                 You may not have any metal on your body including hair pins and              piercings  Do not wear jewelry, make-up, lotions, powders or perfumes, deodorant             Do not wear nail polish.  Do not shave  48 hours prior to surgery.                Do not bring valuables to the hospital. [name].  Contacts, dentures or bridgework may not be worn into surgery.      Patients discharged the day of surgery will not be allowed to drive home.  Name and phone number of your driver:  Special Instructions: coughing and deep breathing exercises, leg exercises               Please read over the following fact sheets you were given: _____________________________________________________________________             [name] - Preparing for Surgery Before surgery, you can play an important role.  Because skin is not sterile, your skin needs to be as free of germs as possible.  You can reduce the number of germs on your skin by washing with CHG (chlorahexidine gluconate) soap before surgery.  CHG is an antiseptic cleaner which kills germs and bonds with the skin to continue killing  germs even after washing. Please DO NOT use if you have an allergy to CHG or antibacterial soaps.  If your skin becomes reddened/irritated stop using the CHG and inform your nurse when you arrive at Short Stay. Do not shave (including legs and underarms) for at least 48 hours prior to the first CHG shower.  You may shave your face/neck. Please follow these instructions carefully:  1.  Shower with CHG Soap the night before surgery and the  morning of Surgery.  2.  If you choose to wash your hair, wash your hair first as usual with your  normal  shampoo.  3.  After you shampoo, rinse your hair and body thoroughly to remove the  shampoo.  4.  Use CHG as you would any other liquid soap.  You can apply chg directly  to the skin and wash                       Gently with a scrungie or clean washcloth.  5.  Apply the CHG Soap to your body ONLY FROM THE NECK DOWN.   Do not use on face/ open                           Wound or open sores. Avoid contact with eyes, ears mouth and genitals (private parts).                       Wash face,  Genitals (private parts) with your normal soap.             6.  Wash thoroughly, paying special attention to the area where your surgery  will be performed.  7.  Thoroughly rinse your body with warm water from the neck down.  8.  DO NOT shower/wash with your normal soap after using and rinsing off  the CHG Soap.                9.  Pat yourself dry with a clean towel.            10.  Wear clean pajamas.            11.  Place clean sheets on your bed the night of your first shower and do not  sleep with pets. Day of Surgery : Do not apply any lotions/deodorants the morning of surgery.  Please wear clean clothes to the hospital/surgery center.  FAILURE TO FOLLOW THESE INSTRUCTIONS MAY RESULT IN THE CANCELLATION OF YOUR SURGERY PATIENT SIGNATURE_________________________________  NURSE  SIGNATURE__________________________________  ________________________________________________________________________  WHAT IS A BLOOD TRANSFUSION? Blood Transfusion Information  A transfusion is the replacement of blood or some of its parts. Blood is made up of multiple cells which provide different functions.  Red blood cells carry oxygen and are used for blood loss replacement.  White blood cells fight against infection.  Platelets control bleeding.  Plasma helps clot blood.  Other blood products are available for specialized needs, such as hemophilia or other clotting disorders. BEFORE THE TRANSFUSION  Who gives blood for transfusions?   Healthy volunteers who are fully evaluated to make sure their blood is safe. This is blood bank blood. Transfusion therapy is the safest it has ever been in the practice of medicine. Before blood is taken from a donor, a complete history is taken to make sure that person has no history of diseases nor engages in risky social behavior (examples are intravenous drug use or sexual activity with multiple partners). The donor's travel history is screened to minimize risk of transmitting infections, such as malaria. The donated blood is tested for signs of infectious diseases, such as HIV and hepatitis. The blood is then tested to be sure it is compatible with you in order to minimize the chance of a transfusion reaction. If you or a relative donates blood, this is often done in anticipation of surgery and is not appropriate for emergency situations. It takes many days to process the donated blood. RISKS AND COMPLICATIONS Although transfusion therapy is very safe and saves many lives, the main dangers of transfusion include:   Getting an infectious disease.  Developing a transfusion reaction. This  is an allergic reaction to something in the blood you were given. Every precaution is taken to prevent this. The decision to have a blood transfusion has been  considered carefully by your caregiver before blood is given. Blood is not given unless the benefits outweigh the risks. AFTER THE TRANSFUSION  Right after receiving a blood transfusion, you will usually feel much better and more energetic. This is especially true if your red blood cells have gotten low (anemic). The transfusion raises the level of the red blood cells which carry oxygen, and this usually causes an energy increase.  The nurse administering the transfusion will monitor you carefully for complications. HOME CARE INSTRUCTIONS  No special instructions are needed after a transfusion. You may find your energy is better. Speak with your caregiver about any limitations on activity for underlying diseases you may have. SEEK MEDICAL CARE IF:   Your condition is not improving after your transfusion.  You develop redness or irritation at the intravenous (IV) site. SEEK IMMEDIATE MEDICAL CARE IF:  Any of the following symptoms occur over the next 12 hours:  Shaking chills.  You have a temperature by mouth above 102 F (38.9 C), not controlled by medicine.  Chest, back, or muscle pain.  People around you feel you are not acting correctly or are confused.  Shortness of breath or difficulty breathing.  Dizziness and fainting.  You get a rash or develop hives.  You have a decrease in urine output.  Your urine turns a dark color or changes to pink, red, or brown. Any of the following symptoms occur over the next 10 days:  You have a temperature by mouth above 102 F (38.9 C), not controlled by medicine.  Shortness of breath.  Weakness after normal activity.  The white part of the eye turns yellow (jaundice).  You have a decrease in the amount of urine or are urinating less often.  Your urine turns a dark color or changes to pink, red, or brown. Document Released: 03/26/2000 Document Revised: 06/21/2011 Document Reviewed: 11/13/2007 ExitCare Patient Information 2014  [place].  _______________________________________________________________________  Incentive Spirometer  An incentive spirometer is a tool that can help keep your lungs clear and active. This tool measures how well you are filling your lungs with each breath. Taking long deep breaths may help reverse or decrease the chance of developing breathing (pulmonary) problems (especially infection) following:  A long period of time when you are unable to move or be active. BEFORE THE PROCEDURE   If the spirometer includes an indicator to show your best effort, your nurse or respiratory therapist will set it to a desired goal.  If possible, sit up straight or lean slightly forward. Try not to slouch.  Hold the incentive spirometer in an upright position. INSTRUCTIONS FOR USE  1. Sit on the edge of your bed if possible, or sit up as far as you can in bed or on a chair. 2. Hold the incentive spirometer in an upright position. 3. Breathe out normally. 4. Place the mouthpiece in your mouth and seal your lips tightly around it. 5. Breathe in slowly and as deeply as possible, raising the piston or the ball toward the top of the column. 6. Hold your breath for 3-5 seconds or for as long as possible. Allow the piston or ball to fall to the bottom of the column. 7. Remove the mouthpiece from your mouth and breathe out normally. 8. Rest for a few seconds and repeat Steps 1  through 7 at least 10 times every 1-2 hours when you are awake. Take your time and take a few normal breaths between deep breaths. 9. The spirometer may include an indicator to show your best effort. Use the indicator as a goal to work toward during each repetition. 10. After each set of 10 deep breaths, practice coughing to be sure your lungs are clear. If you have an incision (the cut made at the time of surgery), support your incision when coughing by placing a pillow or rolled up towels firmly against it. Once you are able to get  out of bed, walk around indoors and cough well. You may stop using the incentive spirometer when instructed by your caregiver.  RISKS AND COMPLICATIONS  Take your time so you do not get dizzy or light-headed.  If you are in pain, you may need to take or ask for pain medication before doing incentive spirometry. It is harder to take a deep breath if you are having pain. AFTER USE  Rest and breathe slowly and easily.  It can be helpful to keep track of a log of your progress. Your caregiver can provide you with a simple table to help with this. If you are using the spirometer at home, follow these instructions: [place] IF:   You are having difficultly using the spirometer.  You have trouble using the spirometer as often as instructed.  Your pain medication is not giving enough relief while using the spirometer.  You develop fever of 100.5 F (38.1 C) or higher. SEEK IMMEDIATE MEDICAL CARE IF:   You cough up bloody sputum that had not been present before.  You develop fever of 102 F (38.9 C) or greater.  You develop worsening pain at or near the incision site. MAKE SURE YOU:   Understand these instructions.  Will watch your condition.  Will get help right away if you are not doing well or get worse. Document Released: 08/09/2006 Document Revised: 06/21/2011 Document Reviewed: 10/10/2006 [facility] Patient Information 2014 [place], [place].   ________________________________________________________________________

## 2017-01-05 ENCOUNTER — Encounter (HOSPITAL_COMMUNITY): Payer: Self-pay

## 2017-01-05 ENCOUNTER — Encounter (HOSPITAL_COMMUNITY)
Admission: RE | Admit: 2017-01-05 | Discharge: 2017-01-05 | Disposition: A | Discharge status: Home / Self Care | Payer: Medicaid Other | Source: Ambulatory Visit | Attending: Gynecologic Oncology | Admitting: Gynecologic Oncology

## 2017-01-05 DIAGNOSIS — Z01818 Encounter for other preprocedural examination: Secondary | ICD-10-CM | POA: Insufficient documentation

## 2017-01-05 DIAGNOSIS — C539 Malignant neoplasm of cervix uteri, unspecified: Secondary | ICD-10-CM | POA: Insufficient documentation

## 2017-01-05 HISTORY — DX: Adverse effect of unspecified anesthetic, initial encounter: T41.45XA

## 2017-01-05 HISTORY — DX: Other complications of anesthesia, initial encounter: T88.59XA

## 2017-01-05 LAB — BASIC METABOLIC PANEL
ANION GAP: 11 (ref 5–15)
BUN: 7 mg/dL (ref 6–20)
CO2: 23 mmol/L (ref 22–32)
Calcium: 9.2 mg/dL (ref 8.9–10.3)
Chloride: 101 mmol/L (ref 101–111)
Creatinine, Ser: 0.89 mg/dL (ref 0.44–1.00)
GFR calc Af Amer: >60 mL/min (ref >60)
GFR calc non Af Amer: >60 mL/min (ref >60)
GLUCOSE: 81 mg/dL (ref 65–99)
POTASSIUM: 4.3 mmol/L (ref 3.5–5.1)
Sodium: 135 mmol/L (ref 135–145)

## 2017-01-05 LAB — CBC
HEMATOCRIT: 41.5 % (ref 36.0–46.0)
HEMOGLOBIN: 14.2 g/dL (ref 12.0–15.0)
MCH: 30.3 pg (ref 26.0–34.0)
MCHC: 34.2 g/dL (ref 30.0–36.0)
MCV: 88.5 fL (ref 78.0–100.0)
Platelets: 307 10*3/uL (ref 150–400)
RBC: 4.69 MIL/uL (ref 3.87–5.11)
RDW: 13.5 % (ref 11.5–15.5)
WBC: 14.5 10*3/uL — ABNORMAL HIGH (ref 4.0–10.5)

## 2017-01-05 LAB — ABO/RH: ABO/RH(D): A POS

## 2017-01-05 NOTE — Progress Notes (Signed)
CBC done 01/05/17 faxed via epic to Dr Denman George and Zoila Shutter.

## 2017-01-05 NOTE — Progress Notes (Signed)
EKG-07/01/16-epic

## 2017-01-06 ENCOUNTER — Telehealth: Payer: Self-pay | Admitting: *Deleted

## 2017-01-06 ENCOUNTER — Encounter: Payer: Self-pay | Admitting: Obstetrics and Gynecology

## 2017-01-07 NOTE — Progress Notes (Signed)
GYN Location of Tumor / Histology: stage IA 2 cervical cancer   [name] presented with symptoms of: On 09/08/16 a pap showed ASC-H.  Biopsies revealed:   11/09/16 Diagnosis Uterus and bilateral fallopian tubes, and right ovary - CERVIX: INVASIVE SQUAMOUS CELL CARCINOMA, WELL DIFFERENTIATED. TUMOR SPANS 0.6 CM AND INVADES TO A DEPTH OF 0.5 CM. RESECTION MARGINS ARE NEGATIVE. SEE ONCOLOGY TABLE. - UTERUS: -ENDOMETRIUM: ENDOMETRIAL TYPE POLYP. INACTIVE ENDOMETRIUM. NO HYPERPLASIA OR MALIGNANCY. -MYOMETRIUM: UNREMARKABLE. NO MALIGNANCY. -SEROSA: UNREMARKABLE. NO MALIGNANCY. - RIGHT OVARY: UNREMARKABLE. NO MALIGNANCY. - BILATERAL FALLOPIAN TUBES: PARATUBAL CYSTS. NO MALIGNANCY.  Past/Anticipated interventions by Gyn/Onc surgery, if any: 01/11/17 - pelvic lymphadenectomy with Dr. Denman George, 11/09/16 - Procedure: HYSTERECTOMY VAGINAL and Procedure: ANTERIOR (CYSTOCELE) AND POSTERIOR REPAIR (RECTOCELE);  Surgeon: Jonnie Kind, MD  Past/Anticipated interventions by medical oncology, if any: no  Weight changes, if any: no  Bowel/Bladder complaints, if any: No.,   Nausea/Vomiting, if any: no  Pain issues, if any:  She reports having soreness in her abdomen from surgery.  She also has numbness in both sides of her groin since surgery.  SAFETY ISSUES:  Prior radiation? no  Pacemaker/ICD? no  Possible current pregnancy? no  Is the patient on methotrexate? no  Current Complaints / other details:  Dr. Denman George is recommending vaginal brachytherapy. Patient is here with her daughter.  BP 119/77 (BP Location: Right Arm, Patient Position: Sitting)   Pulse 94   Temp 98.4 F (36.9 C) (Oral)   Ht 5\' 4"  (1.626 m)   Wt 153 lb 12.8 oz (69.8 kg)   SpO2 96%   BMI 26.40 kg/m    Wt Readings from Last 3 Encounters:  01/17/17 153 lb 12.8 oz (69.8 kg)  01/11/17 151 lb (68.5 kg)  01/05/17 151 lb (68.5 kg)

## 2017-01-08 ENCOUNTER — Ambulatory Visit (HOSPITAL_COMMUNITY): Payer: Medicaid Other

## 2017-01-10 DIAGNOSIS — Z029 Encounter for administrative examinations, unspecified: Secondary | ICD-10-CM

## 2017-01-11 ENCOUNTER — Ambulatory Visit (HOSPITAL_COMMUNITY): Payer: Medicaid Other | Admitting: Anesthesiology

## 2017-01-11 ENCOUNTER — Telehealth: Payer: Self-pay | Admitting: *Deleted

## 2017-01-11 ENCOUNTER — Encounter (HOSPITAL_COMMUNITY)
Admission: RE | Disposition: A | Discharge status: Home / Self Care | Payer: Self-pay | Source: Ambulatory Visit | Attending: Gynecologic Oncology

## 2017-01-11 ENCOUNTER — Encounter (HOSPITAL_COMMUNITY): Payer: Self-pay | Admitting: *Deleted

## 2017-01-11 ENCOUNTER — Ambulatory Visit (HOSPITAL_COMMUNITY)
Admission: RE | Admit: 2017-01-11 | Discharge: 2017-01-11 | Disposition: A | Discharge status: Home / Self Care | Payer: Medicaid Other | Source: Ambulatory Visit | Attending: Gynecologic Oncology | Admitting: Gynecologic Oncology

## 2017-01-11 DIAGNOSIS — C530 Malignant neoplasm of endocervix: Secondary | ICD-10-CM | POA: Diagnosis present

## 2017-01-11 DIAGNOSIS — Z881 Allergy status to other antibiotic agents status: Secondary | ICD-10-CM | POA: Diagnosis not present

## 2017-01-11 DIAGNOSIS — I10 Essential (primary) hypertension: Secondary | ICD-10-CM | POA: Insufficient documentation

## 2017-01-11 DIAGNOSIS — Z79899 Other long term (current) drug therapy: Secondary | ICD-10-CM | POA: Insufficient documentation

## 2017-01-11 DIAGNOSIS — Z90710 Acquired absence of both cervix and uterus: Secondary | ICD-10-CM | POA: Diagnosis not present

## 2017-01-11 DIAGNOSIS — F329 Major depressive disorder, single episode, unspecified: Secondary | ICD-10-CM | POA: Diagnosis not present

## 2017-01-11 DIAGNOSIS — Z888 Allergy status to other drugs, medicaments and biological substances status: Secondary | ICD-10-CM | POA: Insufficient documentation

## 2017-01-11 DIAGNOSIS — M1990 Unspecified osteoarthritis, unspecified site: Secondary | ICD-10-CM | POA: Insufficient documentation

## 2017-01-11 DIAGNOSIS — F17210 Nicotine dependence, cigarettes, uncomplicated: Secondary | ICD-10-CM | POA: Diagnosis not present

## 2017-01-11 DIAGNOSIS — E039 Hypothyroidism, unspecified: Secondary | ICD-10-CM | POA: Diagnosis not present

## 2017-01-11 DIAGNOSIS — C539 Malignant neoplasm of cervix uteri, unspecified: Secondary | ICD-10-CM

## 2017-01-11 HISTORY — PX: ROBOTIC PELVIC AND PARA-AORTIC LYMPH NODE DISSECTION: SHX6210

## 2017-01-11 LAB — TYPE AND SCREEN
ABO/RH(D): A POS
Antibody Screen: NEGATIVE

## 2017-01-11 SURGERY — LYMPHADENECTOMY, PARA-AORTIC AND PELVIC, ROBOT-ASSISTED, LAPAROSCOPIC
Anesthesia: General

## 2017-01-11 MED ORDER — PROPOFOL 10 MG/ML IV BOLUS
INTRAVENOUS | Status: AC
  Filled 2017-01-11: qty 40

## 2017-01-11 MED ORDER — ENOXAPARIN SODIUM 40 MG/0.4ML ~~LOC~~ SOLN
40.0000 mg | SUBCUTANEOUS | Status: AC
  Administered 2017-01-11: 40 mg via SUBCUTANEOUS
  Filled 2017-01-11: qty 0.4

## 2017-01-11 MED ORDER — STERILE WATER FOR IRRIGATION IR SOLN
Status: DC | PRN
  Administered 2017-01-11: 1000 mL

## 2017-01-11 MED ORDER — SCOPOLAMINE 1 MG/3DAYS TD PT72
MEDICATED_PATCH | TRANSDERMAL | Status: AC
  Filled 2017-01-11: qty 1

## 2017-01-11 MED ORDER — FENTANYL CITRATE (PF) 100 MCG/2ML IJ SOLN
INTRAMUSCULAR | Status: DC | PRN
  Administered 2017-01-11 (×3): 50 ug via INTRAVENOUS
  Administered 2017-01-11: 100 ug via INTRAVENOUS
  Administered 2017-01-11 (×2): 50 ug via INTRAVENOUS

## 2017-01-11 MED ORDER — ROCURONIUM BROMIDE 10 MG/ML (PF) SYRINGE
PREFILLED_SYRINGE | INTRAVENOUS | Status: DC | PRN
  Administered 2017-01-11 (×2): 10 mg via INTRAVENOUS
  Administered 2017-01-11: 50 mg via INTRAVENOUS

## 2017-01-11 MED ORDER — OXYCODONE-ACETAMINOPHEN 5-325 MG PO TABS: 1.0000 | ORAL_TABLET | ORAL | 0 refills | Status: DC | PRN

## 2017-01-11 MED ORDER — PROPOFOL 10 MG/ML IV BOLUS
INTRAVENOUS | Status: DC | PRN
  Administered 2017-01-11: 120 mg via INTRAVENOUS

## 2017-01-11 MED ORDER — FENTANYL CITRATE (PF) 100 MCG/2ML IJ SOLN
INTRAMUSCULAR | Status: AC
  Filled 2017-01-11: qty 2

## 2017-01-11 MED ORDER — PHENYLEPHRINE 40 MCG/ML (10ML) SYRINGE FOR IV PUSH (FOR BLOOD PRESSURE SUPPORT)
PREFILLED_SYRINGE | INTRAVENOUS | Status: DC | PRN
  Administered 2017-01-11: 80 ug via INTRAVENOUS

## 2017-01-11 MED ORDER — LACTATED RINGERS IV SOLN
INTRAVENOUS | Status: DC | PRN
  Administered 2017-01-11 (×2): via INTRAVENOUS

## 2017-01-11 MED ORDER — PROMETHAZINE HCL 25 MG/ML IJ SOLN: 6.2500 mg | INTRAMUSCULAR | Status: DC | PRN

## 2017-01-11 MED ORDER — HYDROMORPHONE HCL-NACL 0.5-0.9 MG/ML-% IV SOSY
PREFILLED_SYRINGE | INTRAVENOUS | Status: AC
  Filled 2017-01-11: qty 3

## 2017-01-11 MED ORDER — MIDAZOLAM HCL 5 MG/5ML IJ SOLN
INTRAMUSCULAR | Status: DC | PRN
  Administered 2017-01-11: 2 mg via INTRAVENOUS

## 2017-01-11 MED ORDER — KETOROLAC TROMETHAMINE 30 MG/ML IJ SOLN
30.0000 mg | Freq: Once | INTRAMUSCULAR | Status: DC | PRN
  Administered 2017-01-11: 30 mg via INTRAVENOUS

## 2017-01-11 MED ORDER — LIDOCAINE 2% (20 MG/ML) 5 ML SYRINGE
INTRAMUSCULAR | Status: DC | PRN
  Administered 2017-01-11: 60 mg via INTRAVENOUS

## 2017-01-11 MED ORDER — HYDROMORPHONE HCL-NACL 0.5-0.9 MG/ML-% IV SOSY
0.2500 mg | PREFILLED_SYRINGE | INTRAVENOUS | Status: DC | PRN
  Administered 2017-01-11 (×2): 0.5 mg via INTRAVENOUS

## 2017-01-11 MED ORDER — LACTATED RINGERS IR SOLN
Status: DC | PRN
  Administered 2017-01-11: 1000 mL

## 2017-01-11 MED ORDER — SCOPOLAMINE 1 MG/3DAYS TD PT72
MEDICATED_PATCH | TRANSDERMAL | Status: DC | PRN
  Administered 2017-01-11: 1 via TRANSDERMAL

## 2017-01-11 MED ORDER — KETOROLAC TROMETHAMINE 30 MG/ML IJ SOLN
INTRAMUSCULAR | Status: AC
  Filled 2017-01-11: qty 1

## 2017-01-11 MED ORDER — FENTANYL CITRATE (PF) 250 MCG/5ML IJ SOLN
INTRAMUSCULAR | Status: AC
  Filled 2017-01-11: qty 5

## 2017-01-11 MED ORDER — ROCURONIUM BROMIDE 50 MG/5ML IV SOSY
PREFILLED_SYRINGE | INTRAVENOUS | Status: AC
  Filled 2017-01-11: qty 5

## 2017-01-11 MED ORDER — SUGAMMADEX SODIUM 200 MG/2ML IV SOLN
INTRAVENOUS | Status: DC | PRN
  Administered 2017-01-11: 150 mg via INTRAVENOUS

## 2017-01-11 MED ORDER — DEXAMETHASONE SODIUM PHOSPHATE 10 MG/ML IJ SOLN
INTRAMUSCULAR | Status: DC | PRN
  Administered 2017-01-11: 10 mg via INTRAVENOUS

## 2017-01-11 MED ORDER — ONDANSETRON HCL 4 MG/2ML IJ SOLN
INTRAMUSCULAR | Status: DC | PRN
  Administered 2017-01-11: 4 mg via INTRAVENOUS

## 2017-01-11 MED ORDER — EPHEDRINE SULFATE-NACL 50-0.9 MG/10ML-% IV SOSY
PREFILLED_SYRINGE | INTRAVENOUS | Status: DC | PRN
  Administered 2017-01-11: 15 mg via INTRAVENOUS
  Administered 2017-01-11: 10 mg via INTRAVENOUS

## 2017-01-11 MED ORDER — CEFAZOLIN SODIUM-DEXTROSE 2-4 GM/100ML-% IV SOLN
2.0000 g | INTRAVENOUS | Status: AC
  Administered 2017-01-11: 2 g via INTRAVENOUS

## 2017-01-11 MED ORDER — ONDANSETRON HCL 4 MG/2ML IJ SOLN
INTRAMUSCULAR | Status: AC
  Filled 2017-01-11: qty 2

## 2017-01-11 MED ORDER — MIDAZOLAM HCL 2 MG/2ML IJ SOLN
INTRAMUSCULAR | Status: AC
  Filled 2017-01-11: qty 2

## 2017-01-11 SURGICAL SUPPLY — 40 items
BAG LAPAROSCOPIC 12 15 PORT 16 (BASKET) IMPLANT
BAG RETRIEVAL 12/15 (BASKET)
CHLORAPREP W/TINT 26ML (MISCELLANEOUS) IMPLANT
COVER BACK TABLE 60X90IN (DRAPES) IMPLANT
COVER TIP SHEARS 8 DVNC (MISCELLANEOUS) ×1 IMPLANT
COVER TIP SHEARS 8MM DA VINCI (MISCELLANEOUS) ×1
DERMABOND ADVANCED (GAUZE/BANDAGES/DRESSINGS) ×1
DERMABOND ADVANCED .7 DNX12 (GAUZE/BANDAGES/DRESSINGS) ×1 IMPLANT
DRAPE ARM DVNC X/XI (DISPOSABLE) ×4 IMPLANT
DRAPE COLUMN DVNC XI (DISPOSABLE) ×1 IMPLANT
DRAPE DA VINCI XI ARM (DISPOSABLE) ×4
DRAPE DA VINCI XI COLUMN (DISPOSABLE) ×1
DRAPE SHEET LG 3/4 BI-LAMINATE (DRAPES) ×4 IMPLANT
DRAPE SURG IRRIG POUCH 19X23 (DRAPES) ×2 IMPLANT
GLOVE BIO SURGEON STRL SZ 6 (GLOVE) ×8 IMPLANT
GLOVE BIO SURGEON STRL SZ 6.5 (GLOVE) ×4 IMPLANT
GOWN STRL REUS W/ TWL LRG LVL3 (GOWN DISPOSABLE) ×3 IMPLANT
GOWN STRL REUS W/TWL LRG LVL3 (GOWN DISPOSABLE) ×3
HOLDER FOLEY CATH W/STRAP (MISCELLANEOUS) IMPLANT
IRRIG SUCT STRYKERFLOW 2 WTIP (MISCELLANEOUS) ×2
IRRIGATION SUCT STRKRFLW 2 WTP (MISCELLANEOUS) ×1 IMPLANT
MANIPULATOR UTERINE 4.5 ZUMI (MISCELLANEOUS) IMPLANT
MARKER SKIN DUAL TIP RULER LAB (MISCELLANEOUS) ×2 IMPLANT
OBTURATOR OPTICAL STANDARD 8MM (TROCAR) ×1
OBTURATOR OPTICAL STND 8 DVNC (TROCAR) ×1
OBTURATOR OPTICALSTD 8 DVNC (TROCAR) ×1 IMPLANT
PACK ROBOT GYN CUSTOM WL (TRAY / TRAY PROCEDURE) ×2 IMPLANT
PAD POSITIONING PINK XL (MISCELLANEOUS) ×2 IMPLANT
POUCH SPECIMEN RETRIEVAL 10MM (ENDOMECHANICALS) ×4 IMPLANT
SEAL CANN UNIV 5-8 DVNC XI (MISCELLANEOUS) ×4 IMPLANT
SEAL XI 5MM-8MM UNIVERSAL (MISCELLANEOUS) ×4
SET TRI-LUMEN FLTR TB AIRSEAL (TUBING) ×2 IMPLANT
SOLUTION ELECTROLUBE (MISCELLANEOUS) IMPLANT
SUT VIC AB 0 CT1 27 (SUTURE)
SUT VIC AB 0 CT1 27XBRD ANTBC (SUTURE) IMPLANT
TOWEL OR NON WOVEN STRL DISP B (DISPOSABLE) ×2 IMPLANT
TRAP SPECIMEN MUCOUS 40CC (MISCELLANEOUS) IMPLANT
TRAY FOLEY W/METER SILVER 16FR (SET/KITS/TRAYS/PACK) ×2 IMPLANT
UNDERPAD 30X30 (UNDERPADS AND DIAPERS) ×2 IMPLANT
WATER STERILE IRR 1000ML POUR (IV SOLUTION) ×2 IMPLANT

## 2017-01-11 NOTE — Anesthesia Preprocedure Evaluation (Signed)
Anesthesia Evaluation  Patient identified by MRN, date of birth, ID band Patient awake    Reviewed: Allergy & Precautions, NPO status , Patient's Chart, lab work & pertinent test results  Airway Mallampati: II  TM Distance: >3 FB Neck ROM: Full    Dental no notable dental hx.    Pulmonary Current Smoker,    Pulmonary exam normal breath sounds clear to auscultation       Cardiovascular hypertension, Normal cardiovascular exam Rhythm:Regular Rate:Normal     Neuro/Psych negative neurological ROS  negative psych ROS   GI/Hepatic negative GI ROS, Neg liver ROS,   Endo/Other  Hypothyroidism Hyperthyroidism   Renal/GU negative Renal ROS  negative genitourinary   Musculoskeletal negative musculoskeletal ROS (+)   Abdominal   Peds negative pediatric ROS (+)  Hematology negative hematology ROS (+)   Anesthesia Other Findings   Reproductive/Obstetrics negative OB ROS                             Anesthesia Physical Anesthesia Plan  ASA: II  Anesthesia Plan: General   Post-op Pain Management:    Induction: Intravenous  PONV Risk Score and Plan: 2 and Ondansetron and Dexamethasone  Airway Management Planned: Oral ETT  Additional Equipment:   Intra-op Plan:   Post-operative Plan: Extubation in OR  Informed Consent: I have reviewed the patients History and Physical, chart, labs and discussed the procedure including the risks, benefits and alternatives for the proposed anesthesia with the patient or authorized representative who has indicated his/her understanding and acceptance.   Dental advisory given  Plan Discussed with: CRNA and Surgeon  Anesthesia Plan Comments:         Anesthesia Quick Evaluation

## 2017-01-11 NOTE — Anesthesia Procedure Notes (Signed)
Procedure Name: Intubation Performed by: Gean Maidens Pre-anesthesia Checklist: Patient identified, Emergency Drugs available, Suction available, Patient being monitored and Timeout performed Patient Re-evaluated:Patient Re-evaluated prior to induction Oxygen Delivery Method: Circle system utilized Preoxygenation: Pre-oxygenation with 100% oxygen Induction Type: IV induction Ventilation: Mask ventilation without difficulty Laryngoscope Size: Mac and 3 Grade View: Grade I Tube type: Oral Tube size: 7.0 mm Number of attempts: 1 Airway Equipment and Method: Stylet Placement Confirmation: ETT inserted through vocal cords under direct vision,  positive ETCO2,  CO2 detector and breath sounds checked- equal and bilateral Secured at: 21 cm Tube secured with: Tape Dental Injury: Teeth and Oropharynx as per pre-operative assessment  Comments: Performed by EMT student with supervision

## 2017-01-11 NOTE — Op Note (Signed)
OPERATIVE NOTE 01/11/17  Surgeon: Donaciano Eva   Assistants: Dr Lahoma Crocker (an MD assistant was necessary for tissue manipulation, management of robotic instrumentation, retraction and positioning due to the complexity of the case and hospital policies).   Anesthesia: General endotracheal anesthesia  ASA Class: 3   Pre-operative Diagnosis: stage IA2 cervical cancer  Post-operative Diagnosis: same  Operation: Robotic-assisted laparoscopic bilateral pelvic lymphadenectomy.  Surgeon: Donaciano Eva  Assistant Surgeon: Lahoma Crocker MD  Anesthesia: GET  Urine Output: 300  Operative Findings:  : grossly normal nodes, normal left ovary. No apparent extra-cervical disease. Adhesions between sigmoid epiploica and vaginal cuff.  Estimated Blood Loss:  25cc      Total IV Fluids: 1,000 ml         Specimens: left and right pelvic lymphadenectomy         Complications:  None; patient tolerated the procedure well.         Disposition: PACU - hemodynamically stable.  Procedure Details  The patient was seen in the Holding Room. The risks, benefits, complications, treatment options, and expected outcomes were discussed with the patient.  The patient concurred with the proposed plan, giving informed consent.  The site of surgery properly noted/marked. The patient was identified as [name] and the procedure verified as a Robotic-assisted hysterectomy with bilateral pelvic lymphadenectomy. A Time Out was held and the above information confirmed.  After induction of anesthesia, the patient was draped and prepped in the usual sterile manner. Pt was placed in supine position after anesthesia and draped and prepped in the usual sterile manner. The abdominal drape was placed after the CholoraPrep had been allowed to dry for 3 minutes.  Her arms were tucked to her side with all appropriate precautions.  The shoulders were stabilized with padded shoulder blocks applied  to the acromium processes.  The patient was placed in the semi-lithotomy position in [place].  The perineum was prepped with Betadine. The patient was then prepped. Foley catheter was placed.  OG tube placement was confirmed and to suction.   Next, a 5 mm skin incision was made 1 cm below the subcostal margin in the midclavicular line.  The 5 mm Optiview port and scope was used for direct entry.  Opening pressure was under 10 mm CO2.  The abdomen was insufflated and the findings were noted as above.   At this point and all points during the procedure, the patient's intra-abdominal pressure did not exceed 15 mmHg. Next, a 10 mm skin incision was made in the umbilicus and a right and left port was placed about 10 cm lateral to the robot port on the right and left side.  A fourth arm was placed in the left lower quadrant 2 cm above and superior and medial to the anterior superior iliac spine.  All ports were placed under direct visualization.  The patient was placed in steep Trendelenburg.  Bowel was folded away into the upper abdomen.  The robot was docked in the normal manner.  The right and left peritoneum were opened parallel to the IP ligament to open the retroperitoneal spaces bilaterally.   The right paravesical space was developed with monopolar and sharp dissection. It was held open with tension on the median umbilical ligament with the forth arm. The paravesical space was opened with blunt and sharp dissection to mobilize the ureter off of the medial surface of the internal iliac artery. The medial leaf of the broad ligament containing the ureter was  held medially (opening the pararectal space) by the assistant's grasper. The right pelvic lymphadenectomy was performed by skeletonizing the internal iliac artery at the bifurcation with the external iliac artery. The obturator nerve was identified in the base of lateral paravesical space. The ureter was mobilized medially off of the dissection by  developing the pararectal space. The genitofemoral nerve was identified, skeletonized and mobilized laterally off of the external iliac artery. An enbloc resection of lymph nodes was performed within the following boundaries: the mid portion of the common iliac proximally, the circumflex iliac vein distally, the obturator nerve posteriorally, the genitofemoral nerve laterally. The nodal basin (including obturator space) were confirmed to be empty of nodes and hemostatic. The nodes were placed in an endocatch bag and retrieved vaginally.  The left paravesical space was developed with monopolar and sharp dissection. It was held open with tension on the median umbilical ligament with the forth arm. The paravesical space was opened with blunt and sharp dissection to mobilize the ureter off of the medial surface of the internal iliac artery. The medial leaf of the broad ligament containing the ureter was held medially (opening the pararectal space) by the assistant's grasper. The left pelvic lymphadenectomy was performed by skeletonizing the internal iliac artery at the bifurcation with the external iliac artery. The obturator nerve was identified in the base of lateral paravesical space. The ureter was mobilized medially off of the dissection by developing the pararectal space. The genitofemoral nerve was identified, skeletonized and mobilized laterally off of the external iliac artery. An enbloc resection of lymph nodes was performed within the following boundaries: the mid portion of the common iliac proximally, the circumflex iliac vein distally, the obturator nerve posteriorally, the genitofemoral nerve laterally. The nodal basin (including obturator space) were confirmed to be empty of nodes and hemostatic. The nodes were placed in an endocatch bag and retrieved vaginally.  Irrigation was used and excellent hemostasis was achieved.  At this point in the procedure was completed.  Robotic instruments were removed  under direct visulaization.  The robot was undocked. The 10 mm ports were closed with Vicryl on a UR-5 needle and the fascia was closed with 0 Vicryl on a UR-5 needle.  The skin was closed with 4-0 Vicryl in a subcuticular manner.  Dermabond was applied.  Sponge, lap and needle counts correct x 2.  The patient was taken to the recovery room in stable condition.  The vagina was swabbed with  minimal bleeding noted.   All instrument and needle counts were correct x  3.   The patient was transferred to the recovery room in a stable condition.  Donaciano Eva, MD

## 2017-01-11 NOTE — H&P (View-Only) (Signed)
Consult Note: Gyn-Onc  Consult was requested by Dr. Glo Herring for the evaluation of [name] 61 y.o. female  CC:  Chief Complaint  Patient presents with  . Cervical Cancer    Assessment/Plan:  Ms. Amber ZAVALETA  is a 61 y.o.  year old with stage IA 2 cervical cancer s/p extrafascial vaginal hysterectomy with her OBGYN provider.  I discussed with Ms Cottone that while the margins were negative on her specimen, she did not have the recommended degree of parametrial resection and therefore, given the close (65mm) deep cervical margin, there is an increased risk for recurrence at the vagina. She is also at risk for lymph node positivity.   I am recommending PET/CT, minimally invasive pelvic lymphadenectomy and vaginal brachytherapy (+6 weeks post procedure)  HPI: [name] is a 61 year old parous woman who is seen in consultation at the request of Dr Glo Herring for cervical cancer.  The patient has a remote history of abnormal pap smears. She had an excisional procedure approximately 30 years ago. She had not had a pap smear since approximately 2005. On 09/08/16 a pap showed ASC-H.  She underwent colposcopy with directed biopsies in June, 2018. 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 She then underwent a vaginal hysterectomy with AP repair on 11/09/16 with Dr Glo Herring which revealed squamous cell carcinoma, 58mm maximal dimension, 47mm depth of invasion, no LVSI. The margins were negative however the deep cervical margin was 52mm.   Postop she did well. With no complaints.  The patient is a smoker with hypertension. She has only had a tubal ligation on her abdomen.  Current Meds:  Outpatient Encounter Prescriptions as of 12/20/2016  Medication Sig  . alendronate (FOSAMAX) 70 MG tablet Take 70 mg by mouth once a week. Take with a full glass of water on an empty stomach. Sundays  . amLODipine (NORVASC) 10 MG tablet Take 10 mg  by mouth daily.  Marland Kitchen atorvastatin (LIPITOR) 40 MG tablet Take 40 mg by mouth daily.  . Cholecalciferol (VITAMIN D3) 1000 units CAPS Take 2,000 Units by mouth daily.   Marland Kitchen conjugated estrogens (PREMARIN) vaginal cream Place 1 Applicatorful vaginally 3 (three) times a week. For vaginal thinning and irritation 0.5 gram= 1/4 applicator  . HYDROcodone-acetaminophen (NORCO/VICODIN) 5-325 MG tablet Take 1 tablet by mouth every 6 (six) hours as needed for moderate pain.  Marland Kitchen lisinopril (PRINIVIL,ZESTRIL) 40 MG tablet Take 40 mg by mouth daily.  Marland Kitchen PARoxetine (PAXIL) 40 MG tablet Take 40 mg by mouth daily.   Marland Kitchen thiamine (VITAMIN B-1) 100 MG tablet Take 100 mg by mouth daily.  . traZODone (DESYREL) 100 MG tablet Take 200 mg by mouth at bedtime as needed for sleep.   Marland Kitchen UNABLE TO FIND Smoking patch-changes every 24 hours   No facility-administered encounter medications on file as of 12/20/2016.     Allergy:  Allergies  Allergen Reactions  . Prednisone     Sick to stomach  . Zithromax [Azithromycin] Other (See Comments)    Blisters in mouth     Social Hx:   Social History   Social History  . Marital status: Legally Separated    Spouse name: N/A  . Number of children: N/A  . Years of education: N/A   Occupational History  . Not on file.   Social History Main Topics  . Smoking status: Current Every Day Smoker    Packs/day: 0.00    Years: 44.00  Types: Cigarettes  . Smokeless tobacco: Never Used     Comment: smokes 3 cig daily  . Alcohol use No     Comment: 01-06-2016 Per pt rarely, 02-05-2016 per pt no but 30yrs ago    . Drug use: No     Comment: 02-05-2016 per pt no but about 40 yrs ago  . Sexual activity: Not Currently    Birth control/ protection: Surgical     Comment: hyst   Other Topics Concern  . Not on file   Social History Narrative  . No narrative on file    Past Surgical Hx:  Past Surgical History:  Procedure Laterality Date  . ANTERIOR AND POSTERIOR REPAIR N/A  11/09/2016   Procedure: ANTERIOR (CYSTOCELE) AND POSTERIOR REPAIR (RECTOCELE);  Surgeon: Jonnie Kind, MD;  Location: [facility];  Service: Gynecology;  Laterality: N/A;  . BILATERAL SALPINGECTOMY Left 11/09/2016   Procedure: LEFT SALPINGECTOMY;  Surgeon: Jonnie Kind, MD;  Location: [facility];  Service: Gynecology;  Laterality: Left;  . CATARACT EXTRACTION W/PHACO Right 07/05/2016   Procedure: CATARACT EXTRACTION PHACO AND INTRAOCULAR LENS PLACEMENT (IOC);  Surgeon: Tonny Branch, MD;  Location: [facility];  Service: Ophthalmology;  Laterality: Right;  CDE: 15.41  . CATARACT EXTRACTION W/PHACO Left 07/19/2016   Procedure: CATARACT EXTRACTION PHACO AND INTRAOCULAR LENS PLACEMENT LEFT EYE CDE= 12.65;  Surgeon: Tonny Branch, MD;  Location: [facility];  Service: Ophthalmology;  Laterality: Left;  left  . SALPINGOOPHORECTOMY Right 11/09/2016   Procedure: RIGHT SALPINGO OOPHORECTOMY;  Surgeon: Jonnie Kind, MD;  Location: [facility];  Service: Gynecology;  Laterality: Right;  . TUBAL LIGATION    . VAGINAL HYSTERECTOMY N/A 11/09/2016   Procedure: HYSTERECTOMY VAGINAL;  Surgeon: Jonnie Kind, MD;  Location: [facility];  Service: Gynecology;  Laterality: N/A;    Past Medical Hx:  Past Medical History:  Diagnosis Date  . Arthritis    spinal stenosis  . Back disorder    "crooked spine"  . Bulging lumbar disc   . Depression   . H/O degenerative disc disease   . Hypertension   . Hypothyroidism   . Thyroid disease     Past Gynecological History:  Abnormal pap smears. No LMP recorded. Patient is postmenopausal.  Family Hx:  Family History  Problem Relation Age of Onset  . Hypertension Mother   . Congenital heart disease Mother   . Diabetes Mother   . Thyroid disease Brother   . Other Daughter        bowel issues    Review of Systems:  Constitutional  Feels well,    ENT Normal appearing ears and nares bilaterally Skin/Breast  No rash, sores, jaundice, itching, dryness Cardiovascular  No chest pain,  shortness of breath, or edema  Pulmonary  No cough or wheeze.  Gastro Intestinal  No nausea, vomitting, or diarrhoea. No bright red blood per rectum, no abdominal pain, change in bowel movement, or constipation.  Genito Urinary  No frequency, urgency, dysuria,  Musculo Skeletal  No myalgia, arthralgia, joint swelling or pain  Neurologic  No weakness, numbness, change in gait,  Psychology  No depression, anxiety, insomnia.   Vitals:  Blood pressure (!) 141/73, pulse 78, temperature 98.1 F (36.7 C), temperature source Oral, resp. rate 18, height 5\' 4"  (1.626 m), weight 152 lb 12.8 oz (69.3 kg), SpO2 98 %.  Physical Exam: WD in NAD Neck  Supple NROM, without any enlargements.  Lymph Node Survey No cervical supraclavicular or inguinal adenopathy Cardiovascular  Pulse  normal rate, regularity and rhythm. S1 and S2 normal.  Lungs  Clear to auscultation bilateraly, without wheezes/crackles/rhonchi. Good air movement.  Skin  No rash/lesions/breakdown  Psychiatry  Alert and oriented to person, place, and time  Abdomen  Normoactive bowel sounds, abdomen soft, non-tender and nonobese without evidence of hernia.  Back No CVA tenderness Genito Urinary  Vulva/vagina: Normal external female genitalia.  No lesions. No discharge or bleeding.  Bladder/urethra:  No lesions or masses, well supported bladder  Vagina: suture material seen at cuff. Otherwise healing normally. No visible lesions or masses.  Adnexa: no palpable masses. Rectal  deferred Extremities  No bilateral cyanosis, clubbing or edema.   Donaciano Eva, MD  12/20/2016, 1:14 PM

## 2017-01-11 NOTE — Anesthesia Postprocedure Evaluation (Signed)
Anesthesia Post Note  Patient: [name]  Procedure(s) Performed: XI ROBOTIC BILATERAL PELVIC LYMPH NODE DISSECTION (N/A )     Patient location during evaluation: PACU Anesthesia Type: General Level of consciousness: awake and alert Pain management: pain level controlled Vital Signs Assessment: post-procedure vital signs reviewed and stable Respiratory status: spontaneous breathing, nonlabored ventilation, respiratory function stable and patient connected to nasal cannula oxygen Cardiovascular status: blood pressure returned to baseline and stable Postop Assessment: no apparent nausea or vomiting Anesthetic complications: no    Last Vitals:  Vitals:   01/11/17 1100 01/11/17 1110  BP: (!) 95/56 (!) 96/47  Pulse: 88 80  Resp: 19 20  Temp:  37.1 C  SpO2: 95% 97%    Last Pain:  Vitals:   01/11/17 1112  TempSrc:   PainSc: 4                  Boaz Berisha S

## 2017-01-11 NOTE — Transfer of Care (Signed)
Immediate Anesthesia Transfer of Care Note  Patient: [name]  Procedure(s) Performed: XI ROBOTIC BILATERAL PELVIC LYMPH NODE DISSECTION (N/A )  Patient Location: PACU  Anesthesia Type:General  Level of Consciousness: awake, alert  and oriented  Airway & Oxygen Therapy: Patient Spontanous Breathing and Patient connected to face mask oxygen  Post-op Assessment: Report given to RN and Post -op Vital signs reviewed and stable  Post vital signs: Reviewed and stable  Last Vitals:  Vitals:   01/11/17 0534  BP: (!) 148/85  Pulse: 86  Resp: 18  Temp: 36.9 C  SpO2: 94%    Last Pain:  Vitals:   01/11/17 0534  TempSrc: Oral      Patients Stated Pain Goal: 4 (30/07/62 2633)  Complications: No apparent anesthesia complications

## 2017-01-11 NOTE — Interval H&P Note (Signed)
History and Physical Interval Note:  01/11/2017 7:14 AM  [name]  has presented today for surgery, with the diagnosis of CERVICAL CANCER  The various methods of treatment have been discussed with the patient and family. After consideration of risks, benefits and other options for treatment, the patient has consented to  Procedure(s): XI ROBOTIC PELVIC LYMPH NODE DISSECTION (N/A) as a surgical intervention .  The patient's history has been reviewed, patient examined, no change in status, stable for surgery.  I have reviewed the patient's chart and labs. PET shows no specific abnormality concerning for metastatic disease. Mild elevation in WBC unexplained on physical exam. Questions were answered to the patient's satisfaction.     Donaciano Eva

## 2017-01-11 NOTE — Telephone Encounter (Signed)
Contacted the patient and gave her the post op appt for October 29th at 2pm

## 2017-01-11 NOTE — Discharge Instructions (Signed)
01/11/2017  Return to work: 4 weeks  Activity: 1. Be up and out of the bed during the day.  Take a nap if needed.  You may walk up steps but be careful and use the hand rail.  Stair climbing will tire you more than you think, you may need to stop part way and rest.   2. No lifting or straining for 6 weeks.  3. No driving for 1 weeks.  Do Not drive if you are taking narcotic pain medicine.  4. Shower daily.  Use soap and water on your incision and pat dry; don't rub.   5. No sexual activity and nothing in the vagina for 4 weeks.  Medications:  - Take ibuprofen and tylenol first line for pain control. Take these regularly (every 6 hours) to decrease the build up of pain.  - If necessary, for severe pain not relieved by ibuprofen, take percocet.  - While taking percocet you should take sennakot every night to reduce the likelihood of constipation. If this causes diarrhea, stop its use.  Diet: 1. Low sodium Heart Healthy Diet is recommended.  2. It is safe to use a laxative if you have difficulty moving your bowels.   Wound Care: 1. Keep clean and dry.  Shower daily.  Reasons to call the Doctor:   Fever - Oral temperature greater than 100.4 degrees Fahrenheit  Foul-smelling vaginal discharge  Difficulty urinating  Nausea and vomiting  Increased pain at the site of the incision that is unrelieved with pain medicine.  Difficulty breathing with or without chest pain  New calf pain especially if only on one side  Sudden, continuing increased vaginal bleeding with or without clots.   Follow-up: 1. See [name] in 3 weeks.  Contacts: For questions or concerns you should contact:  Dr. [name] at [phone] After hours and on week-ends call [phone] and ask to speak to the physician on call for Gynecologic Oncology

## 2017-01-13 ENCOUNTER — Telehealth: Payer: Self-pay | Admitting: Gynecologic Oncology

## 2017-01-13 NOTE — Telephone Encounter (Signed)
Informed patient of final path and Dr. Serita Grit recommendations for vaginal brachytherapy.  Doing well post-op.  Advised to call for any needs.

## 2017-01-13 NOTE — Telephone Encounter (Signed)
-----   Message from [name], MD sent at 01/13/2017  7:23 AM EDT ----- Given the negative nodes, vaginal brachytherapy is the recommended treatment (rather than whole pelvic RT). Thanks Terrence Dupont

## 2017-01-17 ENCOUNTER — Ambulatory Visit
Admission: RE | Admit: 2017-01-17 | Discharge: 2017-01-17 | Disposition: A | Discharge status: Home / Self Care | Payer: Medicaid Other | Source: Ambulatory Visit | Attending: Radiation Oncology | Admitting: Radiation Oncology

## 2017-01-17 ENCOUNTER — Encounter: Payer: Self-pay | Admitting: Radiation Oncology

## 2017-01-17 VITALS — BP 119/77 | HR 94 | Temp 98.4°F | Ht 64.0 in | Wt 153.8 lb

## 2017-01-17 DIAGNOSIS — M1990 Unspecified osteoarthritis, unspecified site: Secondary | ICD-10-CM | POA: Insufficient documentation

## 2017-01-17 DIAGNOSIS — Z961 Presence of intraocular lens: Secondary | ICD-10-CM | POA: Insufficient documentation

## 2017-01-17 DIAGNOSIS — Z833 Family history of diabetes mellitus: Secondary | ICD-10-CM | POA: Insufficient documentation

## 2017-01-17 DIAGNOSIS — Z90710 Acquired absence of both cervix and uterus: Secondary | ICD-10-CM | POA: Diagnosis not present

## 2017-01-17 DIAGNOSIS — Z8249 Family history of ischemic heart disease and other diseases of the circulatory system: Secondary | ICD-10-CM | POA: Insufficient documentation

## 2017-01-17 DIAGNOSIS — Z881 Allergy status to other antibiotic agents status: Secondary | ICD-10-CM | POA: Insufficient documentation

## 2017-01-17 DIAGNOSIS — Z888 Allergy status to other drugs, medicaments and biological substances status: Secondary | ICD-10-CM | POA: Insufficient documentation

## 2017-01-17 DIAGNOSIS — I10 Essential (primary) hypertension: Secondary | ICD-10-CM | POA: Diagnosis not present

## 2017-01-17 DIAGNOSIS — C530 Malignant neoplasm of endocervix: Secondary | ICD-10-CM | POA: Diagnosis not present

## 2017-01-17 DIAGNOSIS — E039 Hypothyroidism, unspecified: Secondary | ICD-10-CM | POA: Diagnosis not present

## 2017-01-17 DIAGNOSIS — Z7983 Long term (current) use of bisphosphonates: Secondary | ICD-10-CM | POA: Insufficient documentation

## 2017-01-17 DIAGNOSIS — Z8349 Family history of other endocrine, nutritional and metabolic diseases: Secondary | ICD-10-CM | POA: Insufficient documentation

## 2017-01-17 DIAGNOSIS — Z9841 Cataract extraction status, right eye: Secondary | ICD-10-CM | POA: Insufficient documentation

## 2017-01-17 DIAGNOSIS — F17210 Nicotine dependence, cigarettes, uncomplicated: Secondary | ICD-10-CM | POA: Diagnosis not present

## 2017-01-17 DIAGNOSIS — Z9842 Cataract extraction status, left eye: Secondary | ICD-10-CM | POA: Diagnosis not present

## 2017-01-17 DIAGNOSIS — Z79899 Other long term (current) drug therapy: Secondary | ICD-10-CM | POA: Insufficient documentation

## 2017-01-17 DIAGNOSIS — Z90721 Acquired absence of ovaries, unilateral: Secondary | ICD-10-CM | POA: Insufficient documentation

## 2017-01-17 DIAGNOSIS — F329 Major depressive disorder, single episode, unspecified: Secondary | ICD-10-CM | POA: Diagnosis not present

## 2017-01-17 NOTE — Progress Notes (Signed)
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 female who is kindly referred to our clinic by Dr. Denman George. The patient has a remote history of abnormal pap smears. She had an excisional procedure approximately 30 years ago. She had not had a pap smear since approximately 2005. On 09/08/16 a pap showed ASC-H. She underwent colposcopy with directed biopsy in June 2018. The exocervical biopsies were benign however the endocervical curette showed dysplastic squamous epithelium  "at least CIN 2". She then underwent vaginal hysterectomy with anterior and posterior repair on 11/09/16 with Dr. Glo Herring which revealed squamous cell cervical carcinoma, 6 mm maximal dimension, 5 mm depth of invasion, no LVSI. The margins were negative however the deep cervical margin was 3 mm. CT Abdomen Pelvis was performed on 12/30/16 which showed no evidence of metastatic disease within the abdomen or pelvis. She then underwent bilateral pelvic lymph node dissection on 01/11/17 with Dr. Denman George which showed ten right pelvic lymph nodes and seven left pelvic lymph nodes to be negative for carcinoma. Dr. Denman George is recommending vaginal brachytherapy. The patient is scheduled to follow-up with Dr. Denman George on 02/07/17.   The patient is here for further evaluation and discussion of radiation treatment options in the management of her disease.   PREVIOUS RADIATION THERAPY: No  PAST MEDICAL HISTORY:  has a past medical history of Arthritis; Back disorder; Bulging lumbar disc; Complication of anesthesia; Depression; H/O degenerative disc disease; Hypertension;  Hypothyroidism; and Thyroid disease.    PAST SURGICAL HISTORY: Past Surgical History:  Procedure Laterality Date  . ANTERIOR AND POSTERIOR REPAIR N/A 11/09/2016   Procedure: ANTERIOR (CYSTOCELE) AND POSTERIOR REPAIR (RECTOCELE);  Surgeon: Jonnie Kind, MD;  Location: [facility];  Service: Gynecology;  Laterality: N/A;  . BILATERAL SALPINGECTOMY Left 11/09/2016   Procedure: LEFT SALPINGECTOMY;  Surgeon: Jonnie Kind, MD;  Location: [facility];  Service: Gynecology;  Laterality: Left;  . CATARACT EXTRACTION W/PHACO Right 07/05/2016   Procedure: CATARACT EXTRACTION PHACO AND INTRAOCULAR LENS PLACEMENT (IOC);  Surgeon: Tonny Branch, MD;  Location: [facility];  Service: Ophthalmology;  Laterality: Right;  CDE: 15.41  . CATARACT EXTRACTION W/PHACO Left 07/19/2016   Procedure: CATARACT EXTRACTION PHACO AND INTRAOCULAR LENS PLACEMENT LEFT EYE CDE= 12.65;  Surgeon: Tonny Branch, MD;  Location: [facility];  Service: Ophthalmology;  Laterality: Left;  left  . ROBOTIC PELVIC AND PARA-AORTIC LYMPH NODE DISSECTION N/A 01/11/2017   Procedure: XI ROBOTIC BILATERAL PELVIC LYMPH NODE DISSECTION;  Surgeon: [name], MD;  Location: [facility];  Service: Gynecology;  Laterality: N/A;  . SALPINGOOPHORECTOMY Right 11/09/2016   Procedure: RIGHT SALPINGO OOPHORECTOMY;  Surgeon: Jonnie Kind, MD;  Location: [facility];  Service: Gynecology;  Laterality: Right;  . TUBAL LIGATION    . VAGINAL HYSTERECTOMY N/A 11/09/2016   Procedure: HYSTERECTOMY VAGINAL;  Surgeon: Jonnie Kind, MD;  Location: [facility];  Service: Gynecology;  Laterality: N/A;    FAMILY HISTORY: family history includes Congenital heart disease in her mother; Diabetes in her mother; Hypertension in her mother; Other in her daughter; Thyroid disease in her brother.  SOCIAL HISTORY:  reports that she has been smoking Cigarettes.  She has a 44.00 pack-year smoking history. She has never used smokeless tobacco. She reports that she does not drink alcohol or use  drugs.  ALLERGIES: Prednisone and Zithromax [azithromycin]  MEDICATIONS:  Current Outpatient Prescriptions  Medication Sig Dispense Refill  . alendronate (FOSAMAX) 70 MG tablet Take 70 mg by mouth every Sunday. Take with a full glass of water on an empty stomach.    Marland Kitchen amLODipine (NORVASC) 10 MG tablet Take 10 mg by mouth daily with breakfast.     . atorvastatin (LIPITOR) 40 MG tablet Take 40 mg by mouth at bedtime.     Marland Kitchen lisinopril (PRINIVIL,ZESTRIL) 40 MG tablet Take 40 mg by mouth daily with breakfast.     . Multiple Vitamin (MULTIVITAMIN WITH MINERALS) TABS tablet Take 1 tablet by mouth daily.    Marland Kitchen oxyCODONE-acetaminophen (PERCOCET/ROXICET) 5-325 MG tablet Take 1-2 tablets by mouth every 4 (four) hours as needed for severe pain. 20 tablet 0  . PARoxetine (PAXIL) 40 MG tablet Take 40 mg by mouth daily with breakfast.     . traZODone (DESYREL) 100 MG tablet Take 100-200 mg by mouth at bedtime as needed for sleep.     . vitamin B-12 (CYANOCOBALAMIN) 500 MCG tablet Take 1,000 mcg by mouth daily.    . Aspirin-Salicylamide-Caffeine (BC HEADACHE POWDER PO) Take 1 packet by mouth every 4 (four) hours as needed (for pain).    . conjugated estrogens (PREMARIN) vaginal cream Place 1 Applicatorful vaginally 3 (three) times a week. For vaginal thinning and irritation 0.5 gram= 1/4 applicator (Patient not taking: Reported on 01/17/2017) 42.5 g 2  . dextromethorphan-guaiFENesin (MUCINEX DM) 30-600 MG 12hr tablet Take 1 tablet by mouth 2 (two) times daily as needed for cough.    . nicotine (NICODERM CQ - DOSED IN MG/24 HOURS) 21 mg/24hr patch Place 21 mg onto the skin daily.     No current facility-administered medications for this encounter.     REVIEW OF SYSTEMS:  On review of systems, the patient reports that she is doing well overall. She is accompanied by her daughter today. The patient lives in [place]. She denies any chest pain, shortness of breath, cough, fevers, chills, night sweats, unintended  weight changes. She denies any bowel or bladder disturbances, and denies abdominal pain, nausea or vomiting. She denies headaches or visual problems. She reports having soreness in her abdomen from surgery. She also has numbness in the right  groin since surgery. She denies swelling in her legs since surgery or weakness. She had occasional pain associated with the bladder prolapse. She reports chronic back pain related to spinal stenosis. She is still able to live independently. A complete review of systems is obtained and is otherwise negative. REVIEW OF SYSTEMS: A 10+ POINT REVIEW OF SYSTEMS WAS OBTAINED including neurology, dermatology, psychiatry, cardiac, respiratory, lymph, extremities, GI, GU, musculoskeletal, constitutional, reproductive, HEENT. All pertinent positives are noted in the HPI. All others are negative.    PHYSICAL EXAM:  height is 5\' 4"  (1.626 m) and weight is 153 lb 12.8 oz (69.8 kg). Her oral temperature is 98.4 F (36.9 C). Her blood pressure is 119/77 and her pulse is 94. Her oxygen saturation is 96%.   General: Alert and oriented, in no acute distress HEENT: Head is normocephalic. Extraocular movements are intact. Oropharynx is clear. Dentures on top and bottom. Neck: Neck is supple, no palpable cervical or supraclavicular lymphadenopathy. Heart: Regular in rate and rhythm with no murmurs, rubs, or gallops. Chest: Clear to auscultation bilaterally, with  no rhonchi, wheezes, or rales. Abdomen: Soft, nontender, nondistended, with no rigidity or guarding. Five small scars from her laparoscopic procedure. She does have some bruising of these scars and bruising of the left lateral abdominal wall. Extremities: No cyanosis or edema. Lymphatics: see Neck Exam Skin: No concerning lesions. Musculoskeletal: symmetric strength and muscle tone throughout. No detectable weakness.  Neurologic: Cranial nerves II through XII are grossly intact. No obvious focalities. Speech is fluent.  Coordination is intact. Psychiatric: Judgment and insight are intact. Affect is appropriate. Pelvic examination deferred until planning and simulation day.   ECOG = 1  0 - Asymptomatic (Fully active, able to carry on all predisease activities without restriction)  1 - Symptomatic but completely ambulatory (Restricted in physically strenuous activity but ambulatory and able to carry out work of a light or sedentary nature. For example, light housework, office work)  2 - Symptomatic, <50% in bed during the day (Ambulatory and capable of all self care but unable to carry out any work activities. Up and about more than 50% of waking hours)  3 - Symptomatic, >50% in bed, but not bedbound (Capable of only limited self-care, confined to bed or chair 50% or more of waking hours)  4 - Bedbound (Completely disabled. Cannot carry on any self-care. Totally confined to bed or chair)  5 - Death   Eustace Pen MM, Creech RH, Tormey DC, et al. [phone]). "Toxicity and response criteria of the [facility]". [place] Oncol. 5 (6): 649-55  LABORATORY DATA:  Lab Results  Component Value Date   WBC 14.5 (H) 01/05/2017   HGB 14.2 01/05/2017   HCT 41.5 01/05/2017   MCV 88.5 01/05/2017   PLT 307 01/05/2017   NEUTROABS 6.5 07/01/2016   Lab Results  Component Value Date   NA 135 01/05/2017   K 4.3 01/05/2017   CL 101 01/05/2017   CO2 23 01/05/2017   GLUCOSE 81 01/05/2017   CREATININE 0.89 01/05/2017   CALCIUM 9.2 01/05/2017      RADIOGRAPHY: Ct Abdomen Pelvis W Contrast  Result Date: 12/30/2016 CLINICAL DATA:  Newly diagnosed cervical carcinoma.  Staging. EXAM: CT ABDOMEN AND PELVIS WITH CONTRAST TECHNIQUE: Multidetector CT imaging of the abdomen and pelvis was performed using the standard protocol following bolus administration of intravenous contrast. CONTRAST:  142mL ISOVUE-300 IOPAMIDOL (ISOVUE-300) INJECTION 61% COMPARISON:  10/03/2013 FINDINGS: Lower Chest: No acute findings.  Hepatobiliary: No hepatic masses identified. Gallbladder is unremarkable. Pancreas:  No mass or inflammatory changes. Spleen: Within normal limits in size and appearance. Adrenals/Urinary Tract: Stable 1.9 cm homogeneous low-attenuation right adrenal mass, consistent with benign adenoma. Mild bilateral renal parenchymal scarring. Tiny 2-3 mm nonobstructing calculus in upper pole of right kidney. No evidence of ureteral calculi or hydronephrosis. Mild diffuse bladder wall thickening again seen, which may be due to chronic cystitis or neurogenic bladder. Stomach/Bowel: No evidence of obstruction, inflammatory process or abnormal fluid collections. Normal appendix visualized. Tiny hiatal hernia noted. Vascular/Lymphatic: No pathologically enlarged lymph nodes. No abdominal aortic aneurysm. Aortic atherosclerosis. Reproductive: Prior hysterectomy noted. Adnexal regions are unremarkable in appearance. No abnormal fluid collections. Other:  None. Musculoskeletal:  No suspicious bone lesions identified. IMPRESSION: No evidence of metastatic disease within the abdomen or pelvis. Mild diffuse bladder wall thickening, which may be due to chronic cystitis or neurogenic bladder. Suggest correlation with urinalysis. Tiny nonobstructing right renal calculus. Tiny hiatal hernia. Aortic atherosclerosis. Electronically Signed   By: Earle Gell M.D.   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  The  patient  completed a post operative CT scan of the abdomen and pelvis showing no metastatic spread. The patient then proceeded with robotic assisted laparoscopic pelvis lymphadenectomy to rule out microscopic spread (Dr. Denman George). There was no evidence of metastasis in 17 nodes recovered in the pelvis. Given the close margin on the cervical specimen (0.3 cm), the patient would at risk for vaginal cuff recurrence. I would agree  with Dr. Serita Grit recommendation for vaginal brachytherapy. I discussed course of treatment, potential side effects and toxicities of this treatment with the patient and her daughter. The patient understands and agrees to proceed with this treatment.   PLAN: The patient will be scheduled for CT simulation and planning at least six weeks out from her surgery. The patient will receive 5 HDR treatments directed at the vaginal cuff.       ------------------------------------------------  Blair Promise, PhD, MD  This document serves as a record of services personally performed by Gery Pray, MD. It was created on his behalf by Arlyce Harman, a trained medical scribe. The creation of this record is based on the scribe's personal observations and the provider's statements to them. This document has been checked and approved by the attending provider.

## 2017-01-17 NOTE — Progress Notes (Signed)
Please see the Nurse Progress Note in the MD Initial Consult Encounter for this patient. 

## 2017-01-18 ENCOUNTER — Telehealth: Payer: Self-pay | Admitting: *Deleted

## 2017-01-18 NOTE — Telephone Encounter (Signed)
Called patient to inform of New HDR [place], left message for a return call

## 2017-01-19 ENCOUNTER — Telehealth: Payer: Self-pay | Admitting: *Deleted

## 2017-01-19 NOTE — Telephone Encounter (Signed)
Contacted the patient to move her October 29th appt from the afternoon to the morning due Dr. Denman George meeting.

## 2017-02-07 ENCOUNTER — Telehealth: Payer: Self-pay | Admitting: *Deleted

## 2017-02-07 ENCOUNTER — Ambulatory Visit: Payer: Self-pay | Admitting: Gynecologic Oncology

## 2017-02-07 NOTE — Telephone Encounter (Addendum)
Patient called and rescheduled her post op appt from today to next week. Patient stated that she is "sick this morning." Patient stated that is "doing well after surgery."

## 2017-02-16 ENCOUNTER — Encounter: Payer: Self-pay | Admitting: Gynecologic Oncology

## 2017-02-16 ENCOUNTER — Ambulatory Visit: Payer: Medicaid Other | Attending: Gynecologic Oncology | Admitting: Gynecologic Oncology

## 2017-02-16 VITALS — BP 149/87 | HR 94 | Temp 98.2°F | Resp 20 | Ht 64.5 in | Wt 148.5 lb

## 2017-02-16 DIAGNOSIS — I10 Essential (primary) hypertension: Secondary | ICD-10-CM | POA: Diagnosis not present

## 2017-02-16 DIAGNOSIS — F17210 Nicotine dependence, cigarettes, uncomplicated: Secondary | ICD-10-CM | POA: Diagnosis not present

## 2017-02-16 DIAGNOSIS — Z90710 Acquired absence of both cervix and uterus: Secondary | ICD-10-CM

## 2017-02-16 DIAGNOSIS — C539 Malignant neoplasm of cervix uteri, unspecified: Secondary | ICD-10-CM | POA: Diagnosis present

## 2017-02-16 DIAGNOSIS — E039 Hypothyroidism, unspecified: Secondary | ICD-10-CM | POA: Diagnosis not present

## 2017-02-16 DIAGNOSIS — Z7982 Long term (current) use of aspirin: Secondary | ICD-10-CM | POA: Insufficient documentation

## 2017-02-16 DIAGNOSIS — Z79899 Other long term (current) drug therapy: Secondary | ICD-10-CM | POA: Insufficient documentation

## 2017-02-16 DIAGNOSIS — F329 Major depressive disorder, single episode, unspecified: Secondary | ICD-10-CM | POA: Diagnosis not present

## 2017-02-16 NOTE — Progress Notes (Signed)
Consult Note: Gyn-Onc  Consult was requested by Dr. Glo Herring for the evaluation of [name] 61 y.o. female  CC:  Chief Complaint  Patient presents with  . Malignant neoplasm of cervix, unspecified site [facility])    Assessment/Plan:  Ms. [name]  is a 61 y.o.  year old with stage IA 2 cervical cancer s/p extrafascial vaginal hysterectomy with her OBGYN provider. Negative nodes on surgical assessment. Close margins on primary hysterectomy specimen.  I am recommending vaginal brachytherapy to reduce the risk of vaginal cuff recurrence from close surgical margins.  I will see her back for surveillance visits 3 months after completing therapy.  HPI: [name] is a 61 year old parous woman who is seen in consultation at the request of Dr Glo Herring for cervical cancer.  The patient has a remote history of abnormal pap smears. She had an excisional procedure approximately 30 years ago. She had not had a pap smear since approximately 2005. On 09/08/16 a pap showed ASC-H.  She underwent colposcopy with directed biopsies in June, 2018. 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 She then underwent a vaginal hysterectomy with AP repair on 11/09/16 with Dr Glo Herring which revealed squamous cell carcinoma, 82mm maximal dimension, 99mm depth of invasion, no LVSI. The margins were negative however the deep cervical margin was 72mm.   Postop she did well. With no complaints.  The patient is a smoker with hypertension. She has only had a tubal ligation on her abdomen.  Interval Hx: On 01/11/17 she underwent robotic assisted bilateral pelvic lymphadenectomy. Final pathology confirmed no metastatic disease to the nodes. She developed right genitofemoral nerve numbness in the right groin postop.  Staging CT on 12/30/16 showed no apparent metastatic disease.  Therefore she was dispositioned to vaginal brachytherapy to treat the close  margins on hysterectomy.   Current Meds:  Outpatient Encounter Medications as of 02/16/2017  Medication Sig  . alendronate (FOSAMAX) 70 MG tablet Take 70 mg by mouth every Sunday. Take with a full glass of water on an empty stomach.  Marland Kitchen amLODipine (NORVASC) 10 MG tablet Take 10 mg by mouth daily with breakfast.   . Aspirin-Salicylamide-Caffeine (BC HEADACHE POWDER PO) Take 1 packet by mouth every 4 (four) hours as needed (for pain).  Marland Kitchen atorvastatin (LIPITOR) 40 MG tablet Take 40 mg by mouth at bedtime.   . conjugated estrogens (PREMARIN) vaginal cream Place 1 Applicatorful vaginally 3 (three) times a week. For vaginal thinning and irritation 0.5 gram= 1/4 applicator (Patient not taking: Reported on 01/17/2017)  . dextromethorphan-guaiFENesin (MUCINEX DM) 30-600 MG 12hr tablet Take 1 tablet by mouth 2 (two) times daily as needed for cough.  Marland Kitchen lisinopril (PRINIVIL,ZESTRIL) 40 MG tablet Take 40 mg by mouth daily with breakfast.   . Multiple Vitamin (MULTIVITAMIN WITH MINERALS) TABS tablet Take 1 tablet by mouth daily.  . nicotine (NICODERM CQ - DOSED IN MG/24 HOURS) 21 mg/24hr patch Place 21 mg onto the skin daily.  Marland Kitchen oxyCODONE-acetaminophen (PERCOCET/ROXICET) 5-325 MG tablet Take 1-2 tablets by mouth every 4 (four) hours as needed for severe pain.  Marland Kitchen PARoxetine (PAXIL) 40 MG tablet Take 40 mg by mouth daily with breakfast.   . traZODone (DESYREL) 100 MG tablet Take 100-200 mg by mouth at bedtime as needed for sleep.   . vitamin B-12 (CYANOCOBALAMIN) 500 MCG tablet Take 1,000 mcg by mouth daily.   No facility-administered encounter medications on file as of 02/16/2017.  Allergy:  Allergies  Allergen Reactions  . Prednisone     Sick to stomach  . Zithromax [Azithromycin] Other (See Comments)    Blisters in mouth     Social Hx:   Social History   Socioeconomic History  . Marital status: Legally Separated    Spouse name: Not on file  . Number of children: 1  . Years of education: Not  on file  . Highest education level: Not on file  Social Needs  . Financial resource strain: Not on file  . Food insecurity - worry: Not on file  . Food insecurity - inability: Not on file  . Transportation needs - medical: Not on file  . Transportation needs - non-medical: Not on file  Occupational History  . Not on file  Tobacco Use  . Smoking status: Current Every Day Smoker    Packs/day: 1.00    Years: 44.00    Pack years: 44.00    Types: Cigarettes  . Smokeless tobacco: Never Used  . Tobacco comment: smokes 3 cig daily  Substance and Sexual Activity  . Alcohol use: No    Comment: 01-06-2016 Per pt rarely, 02-05-2016 per pt no but 21yrs ago    . Drug use: No    Comment: 02-05-2016 per pt no but about 40 yrs ago  . Sexual activity: Not Currently    Birth control/protection: Surgical    Comment: hyst  Other Topics Concern  . Not on file  Social History Narrative  . Not on file    Past Surgical Hx:  Past Surgical History:  Procedure Laterality Date  . TUBAL LIGATION      Past Medical Hx:  Past Medical History:  Diagnosis Date  . Arthritis    spinal stenosis  . Back disorder    "crooked spine"  . Bulging lumbar disc   . Complication of anesthesia    ? hypotension 10/2016 at [facility] surgery   . Depression   . H/O degenerative disc disease   . Hypertension   . Hypothyroidism    patient taken off of hypothyroid med in 11/2016   . Thyroid disease     Past Gynecological History:  Abnormal pap smears. No LMP recorded. Patient is postmenopausal.  Family Hx:  Family History  Problem Relation Age of Onset  . Hypertension Mother   . Congenital heart disease Mother   . Diabetes Mother   . Thyroid disease Brother   . Other Daughter        bowel issues    Review of Systems:  Constitutional  Feels well,    ENT Normal appearing ears and nares bilaterally Skin/Breast  No rash, sores, jaundice, itching, dryness Cardiovascular  No chest pain, shortness of  breath, or edema  Pulmonary  No cough or wheeze.  Gastro Intestinal  No nausea, vomitting, or diarrhoea. No bright red blood per rectum, no abdominal pain, change in bowel movement, or constipation.  Genito Urinary  No frequency, urgency, dysuria,  Musculo Skeletal  No myalgia, arthralgia, joint swelling or pain  Neurologic  + discomfort in right groin and numbness right groin Psychology  No depression, anxiety, insomnia.   Vitals:  There were no vitals taken for this visit.  Physical Exam: WD in NAD Neck  Supple NROM, without any enlargements.  Lymph Node Survey No cervical supraclavicular or inguinal adenopathy Cardiovascular  Pulse normal rate, regularity and rhythm. S1 and S2 normal.  Lungs  Clear to auscultation bilateraly, without wheezes/crackles/rhonchi. Good air movement.  Skin  No rash/lesions/breakdown  Psychiatry  Alert and oriented to person, place, and time  Abdomen  Normoactive bowel sounds, abdomen soft, non-tender and nonobese without evidence of hernia.  Well healed incisions. Back No CVA tenderness Genito Urinary  Vulva/vagina: Normal external female genitalia.  No lesions. No discharge or bleeding.  Bladder/urethra:  No lesions or masses, well supported bladder  Vagina: No visible lesions or masses.  Adnexa: no palpable masses. Rectal  deferred Extremities  No bilateral cyanosis, clubbing or edema.   Donaciano Eva, MD  02/16/2017, 4:11 PM

## 2017-02-16 NOTE — Patient Instructions (Signed)
Plan to continue care with Dr. Sondra Come and plan to see Dr. Denman George 3 months after completion of treatment.  Please call for any needs.

## 2017-02-18 ENCOUNTER — Encounter: Payer: Self-pay | Admitting: *Deleted

## 2017-02-18 NOTE — Progress Notes (Signed)
[facility] Psychosocial Distress Screening Clinical Social Work  Clinical Social Work was referred by distress screening protocol.  The patient scored a 8 on the Psychosocial Distress Thermometer which indicates severe distress. Clinical Social Worker attempted to contact patient to assess for distress and other psychosocial needs. CSW left voicemail to return call.  ONCBCN DISTRESS SCREENING 01/17/2017  Screening Type Initial Screening  Distress experienced in past week (1-10) 8  Practical problem type Insurance  Emotional problem type Depression;Nervousness/Anxiety;Adjusting to illness;Feeling hopeless;Boredom  Information Concerns Type Lack of info about diagnosis  Physical Problem type Sleep/insomnia;Loss of appetitie;Tingling hands/feet    Lagretta Loseke Alver Sorrow, MSW, LCSW, OSW-C Clinical Social Worker [facility] [phone]

## 2017-02-21 ENCOUNTER — Telehealth: Payer: Self-pay | Admitting: *Deleted

## 2017-02-21 NOTE — Telephone Encounter (Signed)
CALLED PATIENT TO REMIND OF [facility] [place] FOR 02-22-17, SPOKE WITH PATIENT AND SHE IS AWARE OF THESE APPTS.

## 2017-02-22 ENCOUNTER — Ambulatory Visit
Admission: RE | Admit: 2017-02-22 | Discharge: 2017-02-22 | Disposition: A | Discharge status: Home / Self Care | Payer: Medicaid Other | Source: Ambulatory Visit | Attending: Radiation Oncology | Admitting: Radiation Oncology

## 2017-02-22 ENCOUNTER — Encounter: Payer: Self-pay | Admitting: Radiation Oncology

## 2017-02-22 ENCOUNTER — Other Ambulatory Visit: Payer: Self-pay

## 2017-02-22 VITALS — BP 132/95 | HR 108 | Temp 98.3°F | Ht 64.5 in | Wt 148.4 lb

## 2017-02-22 DIAGNOSIS — C530 Malignant neoplasm of endocervix: Secondary | ICD-10-CM

## 2017-02-22 LAB — URINALYSIS, MICROSCOPIC - CHCC
Bilirubin (Urine): NEGATIVE
GLUCOSE UR CHCC: NEGATIVE mg/dL
Ketones: NEGATIVE mg/dL
Nitrite: NEGATIVE
PROTEIN: NEGATIVE mg/dL
SPECIFIC GRAVITY, URINE: 1.005 (ref 1.003–1.035)
UROBILINOGEN UR: 0.2 mg/dL (ref 0.2–1)
pH: 6.5 (ref 4.6–8.0)

## 2017-02-22 NOTE — Progress Notes (Signed)
  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  Identity was confirmed.  All relevant records and images related to the planned course of therapy were reviewed.  The patient freely provided informed written consent to proceed with treatment after reviewing the details related to the planned course of therapy. The consent form was witnessed and verified by the simulation staff.  Then, the patient was set-up in a stable reproducible  supine position for radiation therapy.  CT images were obtained.  Surface markings were placed.  The CT images were loaded into the planning software.  Then the target and avoidance structures were contoured.  Treatment planning then occurred.  The radiation prescription was entered and confirmed.   I have requested : Brachytherapy Isodose Plan and Dosimetry Calculations to plan the radiation distribution.    PLAN:  The patient will receive 30 Gy in 5 fractions.  Patient will be treated with a 3.0 diameter segmented cylinder with a treatment length of 4 cm. Prescription will be to the mucosal surface at 6 gray per fraction. Iridium 192 will be the high-dose-rate source.    ________________________________  Blair Promise, PhD, MD

## 2017-02-22 NOTE — Progress Notes (Signed)
  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 She will be treated with a 3.0 cm diameter segmented cylinder. This conforms to her anatomy without undue discomfort.  Vaginal brachytherapy procedure node: The patient was brought to the [place] suite. Identity was confirmed. All relevant records and images related to the planned course of therapy were reviewed. The patient freely provided informed written consent to proceed with treatment after reviewing the details related to the planned course of therapy. The consent form was witnessed and verified by the simulation staff. Then, the patient was set-up in a stable reproducible supine position for radiation therapy. Pelvic exam revealed the vaginal cuff to be intact , no mucosal lesions noted,   possible bladder prolapse. The patient's custom vaginal cylinder was placed in the proximal vagina. This was affixed to the CT/MR stabilization plate to prevent slippage. Patient tolerated the placement well.  Verification simulation note:  A fiducial marker was placed within the vaginal cylinder. An AP and lateral film was then obtained through the pelvis area. This documented accurate position of the vaginal cylinder for treatment.  HDR BRACHYTHERAPY TREATMENT  The remote afterloading device was affixed to the vaginal cylinder by catheter. Patient then proceeded to undergo her first high-dose-rate treatment directed at the proximal vagina. The patient was prescribed a dose of 6.0 gray to be delivered to the mucosal surface. Treatment length was 4.0 cm. Patient was treated with 1 channel using 8  dwell positions. Treatment time was 271.7 seconds. Iridium 192 was the high-dose-rate source for treatment. The patient tolerated the treatment well. After completion of her therapy, a radiation survey was performed documenting return of the iridium source into the Gamma[employer] safe.   PLAN: She will return next week for her second high-dose-rate treatment. ________________________________  Blair Promise, PhD, MD

## 2017-02-22 NOTE — Progress Notes (Addendum)
[name] is here for follow up/HDR.  She reports having chronic lower back pain and also pain/pressure in her vagina at a 5/10.  She said it has been hurting off and on for a week and a half.  She thinks her bladder is prolapsed again.  She had it "tacked" in July.  She denies having any urinary/bowel issues or vaginal bleeding.   BP (!) 132/95   Pulse (!) 108   Temp 98.3 F (36.8 C) (Oral)   Ht 5' 4.5" (1.638 m)   Wt 148 lb 6.4 oz (67.3 kg)   BMI 25.08 kg/m    Wt Readings from Last 3 Encounters:  02/22/17 148 lb 6.4 oz (67.3 kg)  02/16/17 148 lb 8 oz (67.4 kg)  01/17/17 153 lb 12.8 oz (69.8 kg)

## 2017-02-22 NOTE — Progress Notes (Signed)
Radiation Oncology         [phone] ________________________________  Name: Schroeder Schroeder MRN: [id number]  Date: 02/22/2017  DOB: [birth year]  Vaginal Brachytherapy Procedure Note  CC: Amber Gravel, MD Schroeder Amber, MD    ICD-10-CM   1. 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 She has noticed some discomfort with urination and the strong odor to her urine but no obvious dysuria.  She denies any hematuria. The patient will present to the lab later this morning for a urinalysis and culture to rule out bladder infection. She also feels that she may have redeveloped up bladder prolapse.  ALLERGIES: is allergic to prednisone and zithromax [azithromycin].  Meds: Current Outpatient Medications  Medication Sig Dispense Refill  . alendronate (FOSAMAX) 70 MG tablet Take 70 mg by mouth every Sunday. Take with a full glass of water on an empty stomach.    Marland Kitchen amLODipine (NORVASC) 10 MG tablet Take 10 mg by mouth daily with breakfast.     . Aspirin-Salicylamide-Caffeine (BC HEADACHE POWDER PO) Take 1 packet by mouth every 4 (four) hours as needed (for pain).    Marland Kitchen atorvastatin (LIPITOR) 40 MG tablet Take 40 mg by mouth at bedtime.     Marland Kitchen lisinopril (PRINIVIL,ZESTRIL) 40 MG tablet Take 40 mg by mouth daily with breakfast.     . Multiple Vitamin (MULTIVITAMIN WITH MINERALS) TABS tablet Take 1 tablet by mouth daily.    Marland Kitchen oxyCODONE-acetaminophen (PERCOCET/ROXICET) 5-325 MG tablet Take 1-2 tablets by mouth every 4 (four) hours as needed for severe pain. 20 tablet 0  . PARoxetine (PAXIL) 40 MG tablet Take 40 mg by mouth daily with breakfast.     . traZODone (DESYREL) 100 MG tablet Take 100-200 mg by mouth at bedtime as needed for sleep.     . vitamin B-12 (CYANOCOBALAMIN) 500  MCG tablet Take 1,000 mcg by mouth daily.    Marland Kitchen conjugated estrogens (PREMARIN) vaginal cream Place 1 Applicatorful vaginally 3 (three) times a week. For vaginal thinning and irritation 0.5 gram= 1/4 applicator (Patient not taking: Reported on 01/17/2017) 42.5 g 2  . dextromethorphan-guaiFENesin (MUCINEX DM) 30-600 MG 12hr tablet Take 1 tablet by mouth 2 (two) times daily as needed for cough.    . nicotine (NICODERM CQ - DOSED IN MG/24 HOURS) 21 mg/24hr patch Place 21 mg onto the skin daily.     No current facility-administered medications for this encounter.     Physical Findings: The patient is in no acute distress. Patient is alert and oriented.  height is 5' 4.5" (1.638 m) and weight is 148 lb 6.4 oz (67.3 kg). Her oral temperature is 98.3 F (36.8 C). Her blood pressure is 132/95 (abnormal) and her pulse is 108 (abnormal).   No palpable cervical, supraclavicular or axillary lymphoadenopathy. The heart has a regular rate and rhythm. The lungs are clear to auscultation. Abdomen soft and non-tender.  On pelvic examination the external genitalia were unremarkable. A speculum exam was performed. Vaginal cuff intact, no mucosal lesions. Probable bladder prolapse . On bimanual exam there were no pelvic masses appreciated.  Lab Findings: Lab Results  Component Value Date   WBC 14.5 (H) 01/05/2017   HGB 14.2 01/05/2017   HCT 41.5 01/05/2017   MCV 88.5 01/05/2017   PLT 307 01/05/2017  Radiographic Findings: No results found.    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 The patient will be treated with a 3.0 cm diameter cylinder with a treatment length of 4.0 cm. This distended the vaginal vault without undue discomfort. The patient tolerated the procedure well.  The patient was successfully fitted for a vaginal cylinder. The patient is appropriate to begin vaginal  brachytherapy.   Plan: The patient will proceed with CT simulation and vaginal brachytherapy today.    _______________________________   Blair Promise, PhD, MD

## 2017-02-24 ENCOUNTER — Telehealth: Payer: Self-pay | Admitting: Oncology

## 2017-02-24 LAB — URINE CULTURE

## 2017-02-24 MED ORDER — CIPROFLOXACIN HCL 250 MG PO TABS: 250.0000 mg | ORAL_TABLET | Freq: Two times a day (BID) | ORAL | 0 refills | Status: AC

## 2017-02-24 NOTE — Telephone Encounter (Signed)
Patient called and asked about the results of her urine culture.  She would like a return call at [phone].

## 2017-02-24 NOTE — Addendum Note (Signed)
Addended by: Elmo Putt R on: 02/24/2017 01:57 PM   Modules accepted: Orders

## 2017-02-24 NOTE — Telephone Encounter (Signed)
Called patient back and advised her that she does have a UTI and that we will send in ciprofloxaxin to her pharmacy.  She verbalized understanding and agreement.

## 2017-02-28 ENCOUNTER — Telehealth: Payer: Self-pay | Admitting: *Deleted

## 2017-02-28 NOTE — Telephone Encounter (Signed)
Called patient to remind of HDR Tx. for 03-01-17 @ 10 am, spoke with patient and she is aware of this appt.

## 2017-03-01 ENCOUNTER — Ambulatory Visit
Admission: RE | Admit: 2017-03-01 | Discharge: 2017-03-01 | Disposition: A | Discharge status: Home / Self Care | Payer: Medicaid Other | Source: Ambulatory Visit | Attending: Radiation Oncology | Admitting: Radiation Oncology

## 2017-03-01 DIAGNOSIS — C530 Malignant neoplasm of endocervix: Secondary | ICD-10-CM | POA: Diagnosis not present

## 2017-03-01 NOTE — Progress Notes (Signed)
  Radiation Oncology         [phone]) [phone] ________________________________  Name: [name] MRN: [id number]  Date: 03/01/2017  DOB: [birth year]  CC: [name], MD  [name], MD  HDR BRACHYTHERAPY NOTE  DIAGNOSIS: Stage IA 2well differentiated squamous cellcervical cancer s/p extrafascial vaginal hysterectomy     Simple treatment device note: Patient had construction of her custom vaginal cylinder. She will be treated with a 3.0 cm diameter segmented cylinder. This conforms to her anatomy without undue discomfort.  Vaginal brachytherapy procedure node: The patient was brought to the [place] suite. Identity was confirmed. All relevant records and images related to the planned course of therapy were reviewed. The patient freely provided informed written consent to proceed with treatment after reviewing the details related to the planned course of therapy. The consent form was witnessed and verified by the simulation staff. Then, the patient was set-up in a stable reproducible supine position for radiation therapy. Pelvic exam revealed the vaginal cuff to be intact . The patient's custom vaginal cylinder was placed in the proximal vagina. This was affixed to the CT/MR stabilization plate to prevent slippage. Patient tolerated the placement well.  Verification simulation note:  A fiducial marker was placed within the vaginal cylinder. An AP and lateral film was then obtained through the pelvis area. This documented accurate position of the vaginal cylinder for treatment.  HDR BRACHYTHERAPY TREATMENT  The remote afterloading device was affixed to the vaginal cylinder by catheter. Patient then proceeded to undergo her second high-dose-rate treatment directed at the proximal vagina. The patient was prescribed a dose of 6.0 gray to be delivered to the mucosal surface. Treatment length was 4 cm. Patient was treated with 1 channel using 8 dwell positions. Treatment time was 290.0 seconds.  Iridium 192 was the high-dose-rate source for treatment. The patient tolerated the treatment well. After completion of her therapy, a radiation survey was performed documenting return of the iridium source into the [employer] safe.   PLAN: The patient will return next week for her third treatment ________________________________  Blair Promise, PhD, MD

## 2017-03-02 DIAGNOSIS — F329 Major depressive disorder, single episode, unspecified: Secondary | ICD-10-CM | POA: Insufficient documentation

## 2017-03-07 ENCOUNTER — Telehealth: Payer: Self-pay | Admitting: *Deleted

## 2017-03-07 ENCOUNTER — Ambulatory Visit: Payer: Medicaid Other | Admitting: "Endocrinology

## 2017-03-07 NOTE — Telephone Encounter (Signed)
Called patient to remind of [place]. for 03-08-17 @ 10 am, spoke with patient and she is aware of this tx.

## 2017-03-08 ENCOUNTER — Ambulatory Visit
Admission: RE | Admit: 2017-03-08 | Discharge: 2017-03-08 | Disposition: A | Discharge status: Home / Self Care | Payer: Medicaid Other | Source: Ambulatory Visit | Attending: Radiation Oncology | Admitting: Radiation Oncology

## 2017-03-08 ENCOUNTER — Telehealth: Payer: Self-pay | Admitting: Oncology

## 2017-03-08 DIAGNOSIS — C530 Malignant neoplasm of endocervix: Secondary | ICD-10-CM

## 2017-03-08 MED ORDER — HYDROCOD POLST-CPM POLST ER 10-8 MG/5ML PO SUER: 5.0000 mL | Freq: Two times a day (BID) | ORAL | 0 refills | Status: DC | PRN

## 2017-03-08 NOTE — Progress Notes (Signed)
  Radiation Oncology         [phone] ________________________________  Name: PANAYIOTA LARKIN MRN: [id number]  Date: 03/08/2017  DOB: 07-31-1955  CC: [name], MD  [name], MD  HDR BRACHYTHERAPY NOTE  DIAGNOSIS: Stage IA 2well differentiated squamous cellcervical cancer s/p extrafascial vaginal hysterectomy    Simple treatment device note: Patient had construction of her custom vaginal cylinder. She will be treated with a 3.0 cm diameter segmented cylinder. This conforms to her anatomy without undue discomfort.  Vaginal brachytherapy procedure node: The patient was brought to the [place] suite. Identity was confirmed. All relevant records and images related to the planned course of therapy were reviewed. The patient freely provided informed written consent to proceed with treatment after reviewing the details related to the planned course of therapy. The consent form was witnessed and verified by the simulation staff. Then, the patient was set-up in a stable reproducible supine position for radiation therapy. Pelvic exam revealed the vaginal cuff to be intact. The patient's custom vaginal cylinder was placed in the proximal vagina. This was affixed to the CT/MR stabilization plate to prevent slippage. Patient tolerated the placement well.  Verification simulation note:  A fiducial marker was placed within the vaginal cylinder. An AP and lateral film was then obtained through the pelvis area. This documented accurate position of the vaginal cylinder for treatment.  HDR BRACHYTHERAPY TREATMENT  The remote afterloading device was affixed to the vaginal cylinder by catheter. Patient then proceeded to undergo her third high-dose-rate treatment directed at the proximal vagina. The patient was prescribed a dose of 6.0 gray to be delivered to the mucosal surface. Treatment length was 4. cm. Patient was treated with 1 channel using  8 dwell positions. Treatment time was 309.7 seconds. Iridium  192 was the high-dose-rate source for treatment. The patient tolerated the treatment well. After completion of her therapy, a radiation survey was performed documenting return of the iridium source into the Gamma[employer] safe.   PLAN: Patient will return next week for her fourth high-dose-rate treatment. The patient is having excessive coughing during the treatment and she has been unable to get an appointment with her primary care physician concerning this issue. I've given her prescription for Tussionex to use in the interim. With excessive coughing she potentially could cause problems with the cylinder dislodging and mis administration of her high-dose-rate treatment. ________________________________  Blair Promise, PhD, MD

## 2017-03-08 NOTE — Telephone Encounter (Signed)
Patient called and said Tussionex was not covered by Medicaid.  She would like [place] to call Medicaid to see if they would cover it.  [place] to see if a prior authorization could be done and also asked if there were any other prescription cough medications that Medicaid would cover.  The pharmacist said that we could try a prior authorization.  He also said there are not any cold medications covered by Medicaid.  Also called [place] [employer] (Medicaid) and was advised that a prior authorization for Tussionex is not necessary as Medicaid does not cover any prescription cough medications.  Called patient back and let her know.  She verbalized agreement and said she is going to pay for the medication out of pocket.

## 2017-03-08 NOTE — Addendum Note (Signed)
Encounter addended by: Gery Pray, MD on: 03/08/2017 10:47 AM  Actions taken: Sign clinical note

## 2017-03-15 ENCOUNTER — Ambulatory Visit
Admission: RE | Admit: 2017-03-15 | Discharge: 2017-03-15 | Disposition: A | Discharge status: Home / Self Care | Payer: Medicaid Other | Source: Ambulatory Visit | Attending: Radiation Oncology | Admitting: Radiation Oncology

## 2017-03-15 DIAGNOSIS — C530 Malignant neoplasm of endocervix: Secondary | ICD-10-CM | POA: Diagnosis not present

## 2017-03-15 NOTE — Progress Notes (Signed)
  Radiation Oncology         (336) [phone] ________________________________  Name: [name] MRN: [id number]  Date: 03/15/2017  DOB: 1955/05/17  CC: [name], MD  [name], MD  HDR BRACHYTHERAPY NOTE  DIAGNOSIS: Stage IA 2well differentiated squamous cellcervical cancer s/p extrafascial vaginal hysterectomy     Simple treatment device note: Patient had construction of her custom vaginal cylinder. She will be treated with a 3.0 cm diameter segmented cylinder. This conforms to her anatomy without undue discomfort.  Vaginal brachytherapy procedure node: The patient was brought to the [place] suite. Identity was confirmed. All relevant records and images related to the planned course of therapy were reviewed. The patient freely provided informed written consent to proceed with treatment after reviewing the details related to the planned course of therapy. The consent form was witnessed and verified by the simulation staff. Then, the patient was set-up in a stable reproducible supine position for radiation therapy. Pelvic exam revealed the vaginal cuff to be intact . The patient's custom vaginal cylinder was placed in the proximal vagina. This was affixed to the CT/MR stabilization plate to prevent slippage. Patient tolerated the placement well.  Verification simulation note:  A fiducial marker was placed within the vaginal cylinder. An AP and lateral film was then obtained through the pelvis area. This documented accurate position of the vaginal cylinder for treatment.  HDR BRACHYTHERAPY TREATMENT  The remote afterloading device was affixed to the vaginal cylinder by catheter. Patient then proceeded to undergo her fourth high-dose-rate treatment directed at the proximal vagina. The patient was prescribed a dose of 6.0 gray to be delivered to the mucosal surface. Treatment length was 4.0 cm. Patient was treated with 1 channel using 8 dwell positions. Treatment time was 330.6 seconds.  Iridium 192 was the high-dose-rate source for treatment. The patient tolerated the treatment well. After completion of her therapy, a radiation survey was performed documenting return of the iridium source into the Gamma[employer] safe.   PLAN: The patient will return next week for her fifth and final high-dose-rate treatment. ________________________________  Blair Promise, PhD, MD

## 2017-03-22 ENCOUNTER — Ambulatory Visit
Admission: RE | Admit: 2017-03-22 | Discharge: 2017-03-22 | Disposition: A | Discharge status: Home / Self Care | Payer: Medicaid Other | Source: Ambulatory Visit | Attending: Radiation Oncology | Admitting: Radiation Oncology

## 2017-03-22 DIAGNOSIS — C530 Malignant neoplasm of endocervix: Secondary | ICD-10-CM | POA: Diagnosis not present

## 2017-03-22 NOTE — Progress Notes (Signed)
  Radiation Oncology         [phone] ________________________________  Name: [name] MRN: [id number]  Date: 03/22/2017  DOB: Jun 27, 1955  CC: [name], MD  [name], MD  HDR BRACHYTHERAPY NOTE  DIAGNOSIS: Stage IA 2well differentiated squamous cellcervical cancer s/p extrafascial vaginal hysterectomy     Simple treatment device note: Patient had construction of her custom vaginal cylinder. She will be treated with a 3.0 cm diameter segmented cylinder. This conforms to her anatomy without undue discomfort.  Vaginal brachytherapy procedure node: The patient was brought to the [place] suite. Identity was confirmed. All relevant records and images related to the planned course of therapy were reviewed. The patient freely provided informed written consent to proceed with treatment after reviewing the details related to the planned course of therapy. The consent form was witnessed and verified by the simulation staff. Then, the patient was set-up in a stable reproducible supine position for radiation therapy. Pelvic exam revealed the vaginal cuff to be intact . The patient's custom vaginal cylinder was placed in the proximal vagina. This was affixed to the CT/MR stabilization plate to prevent slippage. Patient tolerated the placement well.  Verification simulation note:  A fiducial marker was placed within the vaginal cylinder. An AP and lateral film was then obtained through the pelvis area. This documented accurate position of the vaginal cylinder for treatment.  HDR BRACHYTHERAPY TREATMENT  The remote afterloading device was affixed to the vaginal cylinder by catheter. Patient then proceeded to undergo her fifth high-dose-rate treatment directed at the proximal vagina. The patient was prescribed a dose of 6.0 gray to be delivered to the mucosal surface. Treatment length was 4.0 cm. Patient was treated with 1 channel using 8 dwell positions. Treatment time was 353.2 seconds.  Iridium 192 was the high-dose-rate source for treatment. The patient tolerated the treatment well. After completion of her therapy, a radiation survey was performed documenting return of the iridium source into the Gamma[employer] safe.   PLAN: Patient has completed her adjuvant vaginal brachytherapy. She will return for routine follow-up in approximately 4-6 weeks ________________________________  Blair Promise, PhD, MD

## 2017-04-18 ENCOUNTER — Encounter: Payer: Self-pay | Admitting: Radiation Oncology

## 2017-04-18 NOTE — Progress Notes (Signed)
  Radiation Oncology         [phone] ________________________________  Name: [name] MRN: [id number]  Date: 04/18/2017  DOB: 1956-01-15  End of Treatment Note  Diagnosis:   62 y.o. women with Stage IA 2well differentiated squamous cellcervical cancer s/p extrafascial vaginal hysterectomy.     Indication for treatment:  Curative, postop       Radiation treatment dates:   02/22/17 - 03/22/17  Site/dose:   Vagina cuff // 30 Gy in 5 fx  Beams/energy:   Brachytherapy // HDR Ir-192 Vagina, the patient was treated with a 3.0 diameter segmented cylinder with a treatment length of 4 cm.  Narrative: The patient tolerated radiation treatment relatively well.   Plan: The patient has completed radiation treatment. The patient will return to radiation oncology clinic for routine followup in one month. I advised them to call or return sooner if they have any questions or concerns related to their recovery or treatment.  -----------------------------------  Blair Promise, PhD, MD  This document serves as a record of services personally performed by Gery Pray MD. It was created on his behalf by Delton Coombes, a trained medical scribe. The creation of this record is based on the scribe's personal observations and the provider's statements to them.

## 2017-04-19 DIAGNOSIS — M5136 Other intervertebral disc degeneration, lumbar region: Secondary | ICD-10-CM | POA: Insufficient documentation

## 2017-04-22 ENCOUNTER — Encounter: Payer: Self-pay | Admitting: Oncology

## 2017-04-25 ENCOUNTER — Ambulatory Visit
Admission: RE | Admit: 2017-04-25 | Discharge: 2017-04-25 | Disposition: A | Discharge status: Home / Self Care | Payer: Medicaid Other | Source: Ambulatory Visit | Attending: Radiation Oncology | Admitting: Radiation Oncology

## 2017-04-25 DIAGNOSIS — Z7982 Long term (current) use of aspirin: Secondary | ICD-10-CM | POA: Insufficient documentation

## 2017-04-25 DIAGNOSIS — G8929 Other chronic pain: Secondary | ICD-10-CM | POA: Insufficient documentation

## 2017-04-25 DIAGNOSIS — C530 Malignant neoplasm of endocervix: Secondary | ICD-10-CM | POA: Insufficient documentation

## 2017-04-25 DIAGNOSIS — Z90710 Acquired absence of both cervix and uterus: Secondary | ICD-10-CM | POA: Insufficient documentation

## 2017-04-25 DIAGNOSIS — R05 Cough: Secondary | ICD-10-CM | POA: Insufficient documentation

## 2017-04-25 DIAGNOSIS — Z79899 Other long term (current) drug therapy: Secondary | ICD-10-CM | POA: Insufficient documentation

## 2017-04-25 NOTE — Progress Notes (Signed)
  Home Care Instructions for the Insertion and Care of Your Vaginal Dilator  Why Do I Need a Vaginal Dilator?  Internal radiation therapy may cause scar tissue to form at the top of your vagina (vaginal cuff).  This may make vaginal examinations difficult in the future. You can prevent scar tissue from forming by using a vaginal dilator (a smooth plastic rod), and/or by having regular sexual intercourse.  If not using the dilator you should be having intercourse two or three times a week.  If you are unable to have intercourse, you should use your vaginal dilator.  You may have some spotting or bleeding from your dilator or intercourse the first few times. You may also have some discomfort. If discomfort occurs with intercourse, you and your partner may need to stop for a while and try again later.  How to Use Your Vaginal Dilator  - Wash the dilator with soap and water before and after each use. - Check the dilator to be sure it is smooth. Do not use the dilator if you find any roughspots. - Coat the dilator with K-Y Jelly, Astroglide, or Replens. Do not use Vaseline, baby oil, or other oil based lubricants. They are not water-soluble and can be irritating to the tissues in the vagina. - Lie on your back with your knees bent and legs apart. - Insert the rounded end of the dilator into your vagina as far as it will go without causing pain or discomfort. - Close your knees and slowly straighten your legs. - Keep the dilator in your vagina for about 10 to 15 minutes.  Please use 3 times a week, for example: Monday, Wednesday and Friday evenings. [facility] your knees, open your legs, and gently remove the dilator. - Gently cleanse the skin around the vaginal opening. - Wash the dilator after each use. -  It is important that you use the dilator routinely until instructed otherwise by your doctor.

## 2017-05-02 ENCOUNTER — Ambulatory Visit (HOSPITAL_COMMUNITY)
Admission: RE | Admit: 2017-05-02 | Discharge: 2017-05-02 | Disposition: A | Discharge status: Home / Self Care | Payer: Medicaid Other | Source: Ambulatory Visit | Attending: Radiation Oncology | Admitting: Radiation Oncology

## 2017-05-02 ENCOUNTER — Encounter: Payer: Self-pay | Admitting: Radiation Oncology

## 2017-05-02 ENCOUNTER — Telehealth: Payer: Self-pay | Admitting: *Deleted

## 2017-05-02 ENCOUNTER — Other Ambulatory Visit: Payer: Self-pay

## 2017-05-02 ENCOUNTER — Encounter (HOSPITAL_COMMUNITY): Payer: Self-pay

## 2017-05-02 ENCOUNTER — Ambulatory Visit
Admission: RE | Admit: 2017-05-02 | Discharge: 2017-05-02 | Disposition: A | Discharge status: Home / Self Care | Payer: Medicaid Other | Source: Ambulatory Visit | Attending: Radiation Oncology | Admitting: Radiation Oncology

## 2017-05-02 ENCOUNTER — Other Ambulatory Visit: Payer: Self-pay | Admitting: Radiation Oncology

## 2017-05-02 VITALS — BP 163/96 | HR 83 | Temp 98.2°F | Ht 64.5 in | Wt 143.6 lb

## 2017-05-02 DIAGNOSIS — R059 Cough, unspecified: Secondary | ICD-10-CM

## 2017-05-02 DIAGNOSIS — R05 Cough: Secondary | ICD-10-CM | POA: Insufficient documentation

## 2017-05-02 DIAGNOSIS — C801 Malignant (primary) neoplasm, unspecified: Secondary | ICD-10-CM

## 2017-05-02 DIAGNOSIS — Z7982 Long term (current) use of aspirin: Secondary | ICD-10-CM | POA: Diagnosis not present

## 2017-05-02 DIAGNOSIS — C530 Malignant neoplasm of endocervix: Secondary | ICD-10-CM | POA: Diagnosis not present

## 2017-05-02 DIAGNOSIS — Z90710 Acquired absence of both cervix and uterus: Secondary | ICD-10-CM | POA: Diagnosis not present

## 2017-05-02 DIAGNOSIS — Z79899 Other long term (current) drug therapy: Secondary | ICD-10-CM | POA: Diagnosis not present

## 2017-05-02 DIAGNOSIS — G8929 Other chronic pain: Secondary | ICD-10-CM | POA: Diagnosis not present

## 2017-05-02 MED ORDER — GUAIFENESIN ER 600 MG PO TB12: 600.0000 mg | ORAL_TABLET | Freq: Two times a day (BID) | ORAL | 1 refills | Status: DC

## 2017-05-02 NOTE — Progress Notes (Signed)
Radiation Oncology         [phone] ________________________________  Name: [name] MRN: [id number]  Date: 05/02/2017  DOB: Dec 09, 1955  Follow-Up Visit Note  CC: [name], MD  [name], MD    ICD-10-CM   1. S/P vaginal hysterectomy with anterior, and posterior repair, right salpingo-oophorectomy and left salpingectomy Z90.710   2. Malignant neoplasm of endocervix (HCC) C53.0 DG Chest 2 View    Diagnosis:   62 y.o. 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  She tolerated radiation treatment relatively well. Today, she reports chronic lower back pain. She reports a dry cough for the past 6 weeks that is keeping her up at night. She states that she has seen her PCP for it but was not given any medication. She says that the Tussionex we prescribed helped, but her insurance does not cover it, and the cough persists. She denies any urinary issues. She reports bowel incontinence when she starts coughing a lot. She denies any vaginal bleeding but does have yellow vaginal discharge that is lessening in amount. She reports fatigue. She also mentions that she fell and blacked out at home 2 weeks ago and has felt weak and dizzy.                             ALLERGIES:  is allergic to prednisone and zithromax [azithromycin].  Meds: Current Outpatient Medications  Medication Sig Dispense Refill  . alendronate (FOSAMAX) 70 MG tablet Take 70 mg by mouth every Sunday. Take with a full glass of water on an empty stomach.    Marland Kitchen atorvastatin (LIPITOR) 40 MG tablet Take 40 mg by mouth at bedtime.     . citalopram (CELEXA) 20 MG tablet Take one (1) tablet by mouth daily    . dextromethorphan-guaiFENesin (MUCINEX DM) 30-600  MG 12hr tablet Take 1 tablet by mouth 2 (two) times daily as needed for cough.    . hydrOXYzine (ATARAX/VISTARIL) 25 MG tablet Take one (1) tablet by mouth three times a day, as needed    . lisinopril (PRINIVIL,ZESTRIL) 40 MG tablet Take 40 mg by mouth daily with breakfast.     . Multiple Vitamin (MULTIVITAMIN WITH MINERALS) TABS tablet Take 1 tablet by mouth daily.    Marland Kitchen oxyCODONE-acetaminophen (PERCOCET/ROXICET) 5-325 MG tablet Take 1-2 tablets by mouth every 4 (four) hours as needed for severe pain. 20 tablet 0  . traZODone (DESYREL) 100 MG tablet Take 100-200 mg by mouth at bedtime as needed for sleep.     . varenicline (CHANTIX) 0.5 MG tablet Take 0.5 mg by mouth 2 (two) times daily.    . vitamin B-12 (CYANOCOBALAMIN) 500 MCG tablet Take 1,000 mcg by mouth daily.    Marland Kitchen amLODipine (NORVASC) 10 MG tablet Take 10 mg by mouth daily with breakfast.     . Aspirin-Salicylamide-Caffeine (BC HEADACHE POWDER PO) Take 1 packet by mouth every 4 (four) hours as needed (for pain).    . chlorpheniramine-HYDROcodone (TUSSIONEX) 10-8 MG/5ML SUER Take 5 mLs by mouth every 12 (twelve) hours as needed for cough. (Patient not taking: Reported on 05/02/2017) 115 mL 0  . conjugated estrogens (PREMARIN) vaginal cream Place 1 Applicatorful vaginally 3 (three)  times a week. For vaginal thinning and irritation 0.5 gram= 1/4 applicator (Patient not taking: Reported on 01/17/2017) 42.5 g 2  . guaiFENesin (MUCINEX) 600 MG 12 hr tablet Take 1 tablet (600 mg total) by mouth 2 (two) times daily. 30 tablet 1  . nicotine (NICODERM CQ - DOSED IN MG/24 HOURS) 21 mg/24hr patch Place 21 mg onto the skin daily.     No current facility-administered medications for this encounter.     Physical Findings: The patient is in no acute distress. Patient is alert and oriented.  height is 5' 4.5" (1.638 m) and weight is 143 lb 9.6 oz (65.1 kg). Her oral temperature is 98.2 F (36.8 C). Her blood pressure is 163/96 (abnormal) and her pulse is  83. Her oxygen saturation is 96%.   Left lung has wheezes, right lung clear to auscultation. Heart has regular rate and rhythm. No palpable cervical, supraclavicular, or axillary adenopathy. Abdomen soft, non-tender, normal bowel sounds.  Pelvic exam not performed today in light of recent completion of treatment.   Lab Findings: Lab Results  Component Value Date   WBC 14.5 (H) 01/05/2017   HGB 14.2 01/05/2017   HCT 41.5 01/05/2017   MCV 88.5 01/05/2017   PLT 307 01/05/2017    Radiographic Findings: No results found.  Impression:  The patient is recovering from the effects of radiation.  She has been given size S and S+ vaginal dilators and was instructed on how to use them. She has a persistent dry cough and wheezing was noted on exam today.  Plan:  Follow up with Dr. Denman George in 2 months and with radiation oncology in 5 months. For the patient's cough, I prescribed  Mucinex 600 mg twice per day. We will obtain a chest x-ray today, and she will be called with the results. She knows to call or return sooner if she has any more issues or concerns.   ____________________________________  Blair Promise, PhD, MD  This document serves as a record of services personally performed by Gery Pray, MD. It was created on his behalf by Rae Lips, a trained medical scribe. The creation of this record is based on the scribe's personal observations and the provider's statements to them. This document has been checked and approved by the attending provider.

## 2017-05-02 NOTE — Progress Notes (Addendum)
[name] is here for follow up.  She reports having chronic pain in her lower back.  She also said she had a productive cough with white sputum for 6 weeks that is keeping her up at night..  She said she has seen her PCP and was not given any medication.  She said the tussionex helped but her insurance will not pay for it.  She denies having any urinary issues.  She said sometimes she starts coughing a lot and it incontinent of bowel.  She denies having any vaginal bleeding but does have yellow vaginal discharge that is lessening in amount.  She reports feeling fatigued.  She also mentioned that she fell/blacked out at home 2 weeks and has felt weak and dizzy.  She has been given size S+ and M vaginal dilators and was instructed on how to use them.  BP (!) 163/96 (BP Location: Left Arm, Patient Position: Sitting)   Pulse 83   Temp 98.2 F (36.8 C) (Oral)   Ht 5' 4.5" (1.638 m)   Wt 143 lb 9.6 oz (65.1 kg)   SpO2 96%   BMI 24.27 kg/m    Wt Readings from Last 3 Encounters:  05/02/17 143 lb 9.6 oz (65.1 kg)  02/22/17 148 lb 6.4 oz (67.3 kg)  02/16/17 148 lb 8 oz (67.4 kg)

## 2017-05-02 NOTE — Telephone Encounter (Signed)
Enid Derry form radiation called and scheduled a follow up appt for the patient. She will give the patient the appt.

## 2017-05-03 ENCOUNTER — Telehealth: Payer: Self-pay | Admitting: Oncology

## 2017-05-03 NOTE — Telephone Encounter (Signed)
Called patient and let her know that her chest x ray is clear.  She verbalized understanding and did not have any questions.

## 2017-07-06 ENCOUNTER — Telehealth: Payer: Self-pay | Admitting: *Deleted

## 2017-07-06 NOTE — Telephone Encounter (Signed)
Patient called to confirm her appts for Dr. Denman George and Dr. Sondra Come

## 2017-07-13 ENCOUNTER — Inpatient Hospital Stay: Payer: Medicaid Other | Attending: Gynecologic Oncology | Admitting: Gynecologic Oncology

## 2017-07-13 ENCOUNTER — Encounter: Payer: Self-pay | Admitting: Gynecologic Oncology

## 2017-07-13 ENCOUNTER — Other Ambulatory Visit (HOSPITAL_COMMUNITY): Payer: Self-pay | Admitting: Family Medicine

## 2017-07-13 VITALS — BP 158/82 | HR 93 | Temp 98.0°F | Resp 18 | Ht 64.5 in | Wt 142.4 lb

## 2017-07-13 DIAGNOSIS — F17210 Nicotine dependence, cigarettes, uncomplicated: Secondary | ICD-10-CM

## 2017-07-13 DIAGNOSIS — Z90710 Acquired absence of both cervix and uterus: Secondary | ICD-10-CM | POA: Insufficient documentation

## 2017-07-13 DIAGNOSIS — C539 Malignant neoplasm of cervix uteri, unspecified: Secondary | ICD-10-CM | POA: Insufficient documentation

## 2017-07-13 DIAGNOSIS — Z923 Personal history of irradiation: Secondary | ICD-10-CM | POA: Diagnosis not present

## 2017-07-13 DIAGNOSIS — Z1231 Encounter for screening mammogram for malignant neoplasm of breast: Secondary | ICD-10-CM

## 2017-07-13 DIAGNOSIS — E039 Hypothyroidism, unspecified: Secondary | ICD-10-CM

## 2017-07-13 DIAGNOSIS — F329 Major depressive disorder, single episode, unspecified: Secondary | ICD-10-CM | POA: Insufficient documentation

## 2017-07-13 DIAGNOSIS — Z780 Asymptomatic menopausal state: Secondary | ICD-10-CM

## 2017-07-13 DIAGNOSIS — I10 Essential (primary) hypertension: Secondary | ICD-10-CM

## 2017-07-13 DIAGNOSIS — Z79899 Other long term (current) drug therapy: Secondary | ICD-10-CM | POA: Diagnosis not present

## 2017-07-13 NOTE — Patient Instructions (Signed)
Please notify Dr Denman George at phone number [phone] if you notice vaginal bleeding, new pelvic or abdominal pains, bloating, feeling full easy, or a change in bladder or bowel function.   Please ask Dr Clabe Seal staff to schedule a follow-up appointment with Dr Denman George for September, 2019 after you have seen him in June. Alternatively, contact the above number yourself to schedule that appointment after May, 2019.

## 2017-07-13 NOTE — Progress Notes (Signed)
Consult Note: Gyn-Onc  Consult was requested by Dr. Glo Herring for the evaluation of [name] 62 y.o. female  CC:  Chief Complaint  Patient presents with  . Malignant neoplasm of cervix, unspecified site [facility])    Assessment/Plan:  [name]  is a 62 y.o.  year old with stage IA 2 cervical cancer s/p extrafascial vaginal hysterectomy with her OBGYN provider. Negative nodes on surgical assessment. Close margins on primary hysterectomy specimen.   I am recommending vaginal brachytherapy to reduce the risk of vaginal cuff recurrence from close surgical margins.  I will see her back for a surveillance visit 6 months (September, 2019) after she has seen Dr Sondra Come in 3 months.  HPI: [name] is a 62 year old parous woman who is seen in consultation at the request of Dr Glo Herring for cervical cancer.  The patient has a remote history of abnormal pap smears. She had an excisional procedure approximately 30 years ago. She had not had a pap smear since approximately 2005. On 09/08/16 a pap showed ASC-H.  She underwent colposcopy with directed biopsies in June, 2018. 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 She then underwent a vaginal hysterectomy with AP repair on 11/09/16 with Dr Glo Herring which revealed squamous cell carcinoma, 3mm maximal dimension, 60mm depth of invasion, no LVSI. The margins were negative however the deep cervical margin was 66mm.   Postop she did well. With no complaints.  The patient is a smoker with hypertension. She has only had a tubal ligation on her abdomen.  On 01/11/17 she underwent robotic assisted bilateral pelvic lymphadenectomy. Final pathology confirmed no metastatic disease to the nodes. She developed right genitofemoral nerve numbness in the right groin postop.  Staging CT on 12/30/16 showed no apparent metastatic disease.   Interval Hx:  She was dispositioned to vaginal  brachytherapy to treat the close margins on hysterectomy. Radiation treatment dates:02/22/2017 - 03/22/2017 Site/dose:VaginaCuff/ 30Gy in 5 fractions  Current Meds:  Outpatient Encounter Medications as of 07/13/2017  Medication Sig  . alendronate (FOSAMAX) 70 MG tablet Take 70 mg by mouth every Sunday. Take with a full glass of water on an empty stomach.  . Aspirin-Salicylamide-Caffeine (BC HEADACHE POWDER PO) Take 1 packet by mouth every 4 (four) hours as needed (for pain).  Marland Kitchen atorvastatin (LIPITOR) 40 MG tablet Take 40 mg by mouth at bedtime.   Marland Kitchen guaiFENesin (MUCINEX) 600 MG 12 hr tablet Take 1 tablet (600 mg total) by mouth 2 (two) times daily.  Marland Kitchen lisinopril (PRINIVIL,ZESTRIL) 40 MG tablet Take 40 mg by mouth daily with breakfast.   . Multiple Vitamin (MULTIVITAMIN WITH MINERALS) TABS tablet Take 1 tablet by mouth daily.  Marland Kitchen oxyCODONE-acetaminophen (PERCOCET/ROXICET) 5-325 MG tablet Take 1-2 tablets by mouth every 4 (four) hours as needed for severe pain.  . traZODone (DESYREL) 100 MG tablet Take 100-200 mg by mouth at bedtime as needed for sleep.   . vitamin B-12 (CYANOCOBALAMIN) 500 MCG tablet Take 1,000 mcg by mouth daily.  . chlorpheniramine-HYDROcodone (TUSSIONEX) 10-8 MG/5ML SUER Take 5 mLs by mouth every 12 (twelve) hours as needed for cough. (Patient not taking: Reported on 05/02/2017)  . citalopram (CELEXA) 20 MG tablet Take one (1) tablet by mouth daily  . [DISCONTINUED] amLODipine (NORVASC) 10 MG tablet Take 10 mg by mouth daily with breakfast.   . [DISCONTINUED] conjugated estrogens (PREMARIN) vaginal cream Place 1 Applicatorful vaginally 3 (three) times a week. For vaginal thinning and irritation  0.5 gram= 1/4 applicator (Patient not taking: Reported on 01/17/2017)  . [DISCONTINUED] dextromethorphan-guaiFENesin (MUCINEX DM) 30-600 MG 12hr tablet Take 1 tablet by mouth 2 (two) times daily as needed for cough.  . [DISCONTINUED] nicotine (NICODERM CQ - DOSED IN MG/24 HOURS) 21  mg/24hr patch Place 21 mg onto the skin daily.  . [DISCONTINUED] varenicline (CHANTIX) 0.5 MG tablet Take 0.5 mg by mouth 2 (two) times daily.   No facility-administered encounter medications on file as of 07/13/2017.     Allergy:  Allergies  Allergen Reactions  . Prednisone     Sick to stomach  . Zithromax [Azithromycin] Other (See Comments)    Blisters in mouth     Social Hx:   Social History   Socioeconomic History  . Marital status: Legally Separated    Spouse name: Not on file  . Number of children: 1  . Years of education: Not on file  . Highest education level: Not on file  Occupational History  . Not on file  Social Needs  . Financial resource strain: Not on file  . Food insecurity:    Worry: Not on file    Inability: Not on file  . Transportation needs:    Medical: Not on file    Non-medical: Not on file  Tobacco Use  . Smoking status: Current Every Day Smoker    Packs/day: 1.00    Years: 44.00    Pack years: 44.00    Types: Cigarettes  . Smokeless tobacco: Never Used  . Tobacco comment: smokes 3 cig daily  Substance and Sexual Activity  . Alcohol use: No    Comment: 01-06-2016 Per pt rarely, 02-05-2016 per pt no but 38yrs ago    . Drug use: No    Comment: 02-05-2016 per pt no but about 40 yrs ago  . Sexual activity: Not Currently    Birth control/protection: Surgical    Comment: hyst  Lifestyle  . Physical activity:    Days per week: Not on file    Minutes per session: Not on file  . Stress: Not on file  Relationships  . Social connections:    Talks on phone: Not on file    Gets together: Not on file    Attends religious service: Not on file    Active member of club or organization: Not on file    Attends meetings of clubs or organizations: Not on file    Relationship status: Not on file  . Intimate partner violence:    Fear of current or ex partner: Not on file    Emotionally abused: Not on file    Physically abused: Not on file    Forced  sexual activity: Not on file  Other Topics Concern  . Not on file  Social History Narrative  . Not on file    Past Surgical Hx:  Past Surgical History:  Procedure Laterality Date  . ANTERIOR AND POSTERIOR REPAIR N/A 11/09/2016   Procedure: ANTERIOR (CYSTOCELE) AND POSTERIOR REPAIR (RECTOCELE);  Surgeon: Jonnie Kind, MD;  Location: [facility];  Service: Gynecology;  Laterality: N/A;  . BILATERAL SALPINGECTOMY Left 11/09/2016   Procedure: LEFT SALPINGECTOMY;  Surgeon: Jonnie Kind, MD;  Location: [facility];  Service: Gynecology;  Laterality: Left;  . CATARACT EXTRACTION W/PHACO Right 07/05/2016   Procedure: CATARACT EXTRACTION PHACO AND INTRAOCULAR LENS PLACEMENT (IOC);  Surgeon: Tonny Branch, MD;  Location: [facility];  Service: Ophthalmology;  Laterality: Right;  CDE: 15.41  . CATARACT EXTRACTION W/PHACO Left  07/19/2016   Procedure: CATARACT EXTRACTION PHACO AND INTRAOCULAR LENS PLACEMENT LEFT EYE CDE= 12.65;  Surgeon: Tonny Branch, MD;  Location: [facility];  Service: Ophthalmology;  Laterality: Left;  left  . ROBOTIC PELVIC AND PARA-AORTIC LYMPH NODE DISSECTION N/A 01/11/2017   Procedure: XI ROBOTIC BILATERAL PELVIC LYMPH NODE DISSECTION;  Surgeon: [name], MD;  Location: [facility];  Service: Gynecology;  Laterality: N/A;  . SALPINGOOPHORECTOMY Right 11/09/2016   Procedure: RIGHT SALPINGO OOPHORECTOMY;  Surgeon: Jonnie Kind, MD;  Location: [facility];  Service: Gynecology;  Laterality: Right;  . TUBAL LIGATION    . VAGINAL HYSTERECTOMY N/A 11/09/2016   Procedure: HYSTERECTOMY VAGINAL;  Surgeon: Jonnie Kind, MD;  Location: [facility];  Service: Gynecology;  Laterality: N/A;    Past Medical Hx:  Past Medical History:  Diagnosis Date  . Arthritis    spinal stenosis  . Back disorder    "crooked spine"  . Bulging lumbar disc   . Cervical cancer ([place])   . Complication of anesthesia    ? hypotension 10/2016 at [facility] surgery   . Depression   . H/O degenerative disc disease   . History of  radiation therapy 02/22/17-03/22/17   vaginal cuff 30 Gy in 5 fractions  . Hypertension   . Hypothyroidism    patient taken off of hypothyroid med in 11/2016   . Thyroid disease     Past Gynecological History:  Abnormal pap smears. No LMP recorded. Patient is postmenopausal.  Family Hx:  Family History  Problem Relation Age of Onset  . Hypertension Mother   . Congenital heart disease Mother   . Diabetes Mother   . Thyroid disease Brother   . Other Daughter        bowel issues    Review of Systems:  Constitutional  Feels well,    ENT Normal appearing ears and nares bilaterally Skin/Breast  No rash, sores, jaundice, itching, dryness Cardiovascular  No chest pain, shortness of breath, or edema  Pulmonary  No cough or wheeze.  Gastro Intestinal  No nausea, vomitting, or diarrhoea. No bright red blood per rectum, no abdominal pain, change in bowel movement, or constipation.  Genito Urinary  No frequency, urgency, dysuria,  Musculo Skeletal  No myalgia, arthralgia, joint swelling or pain  Neurologic  + discomfort in right groin and numbness right groin Psychology  No depression, anxiety, insomnia.   Vitals:  Blood pressure (!) 158/82, pulse 93, temperature 98 F (36.7 C), temperature source Oral, resp. rate 18, height 5' 4.5" (1.638 m), weight 142 lb 6.4 oz (64.6 kg), SpO2 97 %.  Physical Exam: WD in NAD Neck  Supple NROM, without any enlargements.  Lymph Node Survey No cervical supraclavicular or inguinal adenopathy Cardiovascular  Pulse normal rate, regularity and rhythm. S1 and S2 normal.  Lungs  Clear to auscultation bilateraly, without wheezes/crackles/rhonchi. Good air movement.  Skin  No rash/lesions/breakdown  Psychiatry  Alert and oriented to person, place, and time  Abdomen  Normoactive bowel sounds, abdomen soft, non-tender and nonobese without evidence of hernia.  Soft incisions. Back No CVA tenderness Genito Urinary  Vulva/vagina: Normal  external female genitalia.  No lesions. No discharge or bleeding.  Bladder/urethra:  No lesions or masses, well supported bladder  Vagina: No visible lesions or masses.  Adnexa: no palpable masses. Rectal  deferred Extremities  No bilateral cyanosis, clubbing or edema.   Thereasa Solo, MD  07/13/2017, 2:38 PM

## 2017-07-29 ENCOUNTER — Ambulatory Visit (HOSPITAL_COMMUNITY): Payer: Self-pay

## 2017-08-03 ENCOUNTER — Ambulatory Visit (HOSPITAL_COMMUNITY)
Admission: RE | Admit: 2017-08-03 | Discharge: 2017-08-03 | Disposition: A | Discharge status: Home / Self Care | Payer: Medicaid Other | Source: Ambulatory Visit | Attending: Family Medicine | Admitting: Family Medicine

## 2017-08-03 ENCOUNTER — Encounter (HOSPITAL_COMMUNITY): Payer: Self-pay

## 2017-08-03 DIAGNOSIS — Z1231 Encounter for screening mammogram for malignant neoplasm of breast: Secondary | ICD-10-CM | POA: Insufficient documentation

## 2017-09-06 ENCOUNTER — Ambulatory Visit (HOSPITAL_COMMUNITY)
Admission: RE | Admit: 2017-09-06 | Discharge: 2017-09-06 | Disposition: A | Discharge status: Home / Self Care | Payer: Medicaid Other | Source: Ambulatory Visit | Attending: Internal Medicine | Admitting: Internal Medicine

## 2017-09-22 DIAGNOSIS — M545 Low back pain: Secondary | ICD-10-CM | POA: Diagnosis not present

## 2017-09-22 DIAGNOSIS — F1120 Opioid dependence, uncomplicated: Secondary | ICD-10-CM | POA: Diagnosis not present

## 2017-09-22 DIAGNOSIS — Z79899 Other long term (current) drug therapy: Secondary | ICD-10-CM | POA: Diagnosis not present

## 2017-09-22 DIAGNOSIS — I10 Essential (primary) hypertension: Secondary | ICD-10-CM | POA: Diagnosis not present

## 2017-09-22 DIAGNOSIS — R4189 Other symptoms and signs involving cognitive functions and awareness: Secondary | ICD-10-CM | POA: Diagnosis not present

## 2017-09-22 DIAGNOSIS — Z5181 Encounter for therapeutic drug level monitoring: Secondary | ICD-10-CM | POA: Diagnosis not present

## 2017-09-22 DIAGNOSIS — M810 Age-related osteoporosis without current pathological fracture: Secondary | ICD-10-CM | POA: Diagnosis not present

## 2017-09-26 ENCOUNTER — Ambulatory Visit: Payer: Medicaid Other | Admitting: Radiation Oncology

## 2017-10-03 ENCOUNTER — Other Ambulatory Visit: Payer: Self-pay

## 2017-10-03 ENCOUNTER — Ambulatory Visit
Admission: RE | Admit: 2017-10-03 | Discharge: 2017-10-03 | Disposition: A | Discharge status: Home / Self Care | Payer: PPO | Source: Ambulatory Visit | Attending: Radiation Oncology | Admitting: Radiation Oncology

## 2017-10-03 ENCOUNTER — Encounter: Payer: Self-pay | Admitting: Radiation Oncology

## 2017-10-03 ENCOUNTER — Ambulatory Visit: Payer: Self-pay | Admitting: Radiation Oncology

## 2017-10-03 VITALS — BP 108/76 | HR 97 | Temp 98.5°F | Resp 18 | Wt 137.6 lb

## 2017-10-03 DIAGNOSIS — Z8541 Personal history of malignant neoplasm of cervix uteri: Secondary | ICD-10-CM | POA: Insufficient documentation

## 2017-10-03 DIAGNOSIS — Z08 Encounter for follow-up examination after completed treatment for malignant neoplasm: Secondary | ICD-10-CM | POA: Diagnosis not present

## 2017-10-03 DIAGNOSIS — R200 Anesthesia of skin: Secondary | ICD-10-CM | POA: Insufficient documentation

## 2017-10-03 DIAGNOSIS — N8110 Cystocele, unspecified: Secondary | ICD-10-CM | POA: Insufficient documentation

## 2017-10-03 DIAGNOSIS — Z888 Allergy status to other drugs, medicaments and biological substances status: Secondary | ICD-10-CM | POA: Diagnosis not present

## 2017-10-03 DIAGNOSIS — Z79899 Other long term (current) drug therapy: Secondary | ICD-10-CM | POA: Diagnosis not present

## 2017-10-03 DIAGNOSIS — M549 Dorsalgia, unspecified: Secondary | ICD-10-CM | POA: Insufficient documentation

## 2017-10-03 DIAGNOSIS — Z881 Allergy status to other antibiotic agents status: Secondary | ICD-10-CM | POA: Diagnosis not present

## 2017-10-03 DIAGNOSIS — C530 Malignant neoplasm of endocervix: Secondary | ICD-10-CM

## 2017-10-03 DIAGNOSIS — Z90710 Acquired absence of both cervix and uterus: Secondary | ICD-10-CM | POA: Insufficient documentation

## 2017-10-03 DIAGNOSIS — F17210 Nicotine dependence, cigarettes, uncomplicated: Secondary | ICD-10-CM | POA: Diagnosis not present

## 2017-10-03 DIAGNOSIS — G8929 Other chronic pain: Secondary | ICD-10-CM | POA: Diagnosis not present

## 2017-10-03 NOTE — Progress Notes (Signed)
Radiation Oncology         [phone] ________________________________  Name: [name] MRN: [id number]  Date: 10/03/2017  DOB: 09/12/1955  Follow-Up Visit Note  CC: [name], MD  [name], MD    ICD-10-CM   1. Malignant neoplasm of endocervix [facility]) C53.0     Diagnosis:   62 y.o. 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  She reports chronic back pain. She reports moderate fatigue. She reports numbness in her right inguinal area since her lymph node dissection. She denies any hematuria, dysuria, or vaginal discharge. She reports occasional urinary leakage due to bladder prolapse. She hasn't ever tried physical therapy for this and does not wish to at this time. She denies any rectal bleeding. She denies any nausea, vomiting, or bowel issues. She denies any skin irritation. She states that she uses her dilator twice a month. She continues to smoke 1 ppd. She started smoking 45 years ago. She has tried quitting with Chantix but was not successful. She reports  never had a chest CT.                    ALLERGIES:  is allergic to prednisone and zithromax [azithromycin].  Meds: Current Outpatient Medications  Medication Sig Dispense Refill  . alendronate (FOSAMAX) 70 MG tablet Take 70 mg by mouth every Sunday. Take with a full glass of water on an empty stomach.    . Aspirin-Salicylamide-Caffeine (BC HEADACHE POWDER PO) Take 1 packet by mouth every 4 (four) hours as needed (for pain).    Marland Kitchen atorvastatin (LIPITOR) 40 MG tablet Take 40 mg by mouth at bedtime.     . cholecalciferol (VITAMIN D) 1000 units tablet Take 5,000 Units by mouth once a week.    Marland Kitchen lisinopril (PRINIVIL,ZESTRIL) 40  MG tablet Take 40 mg by mouth daily with breakfast.     . Multiple Vitamin (MULTIVITAMIN WITH MINERALS) TABS tablet Take 1 tablet by mouth daily.    Marland Kitchen oxyCODONE-acetaminophen (PERCOCET/ROXICET) 5-325 MG tablet Take 1-2 tablets by mouth every 4 (four) hours as needed for severe pain. 20 tablet 0  . traZODone (DESYREL) 100 MG tablet Take 100-200 mg by mouth at bedtime as needed for sleep.     . vitamin B-12 (CYANOCOBALAMIN) 500 MCG tablet Take 1,000 mcg by mouth daily.    . chlorpheniramine-HYDROcodone (TUSSIONEX) 10-8 MG/5ML SUER Take 5 mLs by mouth every 12 (twelve) hours as needed for cough. (Patient not taking: Reported on 05/02/2017) 115 mL 0  . citalopram (CELEXA) 20 MG tablet Take one (1) tablet by mouth daily    . guaiFENesin (MUCINEX) 600 MG 12 hr tablet Take 1 tablet (600 mg total) by mouth 2 (two) times daily. (Patient not taking: Reported on 10/03/2017) 30 tablet 1   No current facility-administered medications for this encounter.     Physical Findings: The patient is in no acute distress. Patient is alert and oriented.  weight is 137 lb 9.6 oz (62.4 kg). Her oral temperature is 98.5 F (36.9 C). Her blood pressure is 108/76 and her pulse is 97. Her respiration is 18 and oxygen saturation is 95%.   Lungs have some expiratory wheezing bilaterally. Heart has regular rate and rhythm. No palpable cervical, supraclavicular, or axillary adenopathy. Abdomen soft,  non-tender, normal bowel sounds.  On pelvic examination the external genitalia were unremarkable. A speculum exam was performed. There are no mucosal lesions noted in the vaginal vault. On bimanual and rectovaginal examination there were no pelvic masses appreciated.  Lab Findings: Lab Results  Component Value Date   WBC 14.5 (H) 01/05/2017   HGB 14.2 01/05/2017   HCT 41.5 01/05/2017   MCV 88.5 01/05/2017   PLT 307 01/05/2017    Radiographic Findings: No results found.  Impression:  No evidence of recurrence on clinical  exam.   Plan:  Patient will follow up with Dr. Denman George in 3 months. Follow up in radiation oncology in 6 months. Patient is a smoker and smokes approximately 1 pack of cigarettes per day. She has attempted to stop smoking several times in the past but has been unsuccessful. We talked about going through the lung cancer screening program, and the patient wishes to proceed with this. We will make a referral to this program.   ____________________________________  Blair Promise, PhD, MD  This document serves as a record of services personally performed by Gery Pray, MD. It was created on his behalf by Rae Lips, a trained medical scribe. The creation of this record is based on the scribe's personal observations and the provider's statements to them. This document has been checked and approved by the attending provider.

## 2017-10-03 NOTE — Progress Notes (Addendum)
Patient is here for a follow-up appointment denies any pain .States that she has moderate fatigue. Denies any hematuria,dsyuria,or discharge. Denies any rectal bleeding. Denies any nausea or vomiting. Denies any issues with her bowels. Denies any skin irritation. States that she uses her dilator twice a month.  Wt Readings from Last 3 Encounters:  10/03/17 137 lb 9.6 oz (62.4 kg)  07/13/17 142 lb 6.4 oz (64.6 kg)  05/02/17 143 lb 9.6 oz (65.1 kg)   Vitals:   10/03/17 1626 10/03/17 1634  BP: 99/74 108/76  Pulse: 90 97  Resp: 18 18  Temp: 98.5 F (36.9 C)   TempSrc: Oral   SpO2: 95%   Weight: 137 lb 9.6 oz (62.4 kg)

## 2017-10-04 ENCOUNTER — Telehealth: Payer: Self-pay | Admitting: *Deleted

## 2017-10-04 NOTE — Telephone Encounter (Signed)
CALLED PATIENT TO INFORM OF APPT. WITH DR. Denman George ON 01-09-18 @ 3 PM AND HER FU WITH DR. KINARD ON 04-10-18 @ 3 PM, SPOKE WITH PATIENT AND SHE IS AWARE OF THESE APPTS.

## 2017-10-20 DIAGNOSIS — M4800 Spinal stenosis, site unspecified: Secondary | ICD-10-CM | POA: Diagnosis not present

## 2017-10-20 DIAGNOSIS — M810 Age-related osteoporosis without current pathological fracture: Secondary | ICD-10-CM | POA: Diagnosis not present

## 2017-10-20 DIAGNOSIS — M545 Low back pain: Secondary | ICD-10-CM | POA: Diagnosis not present

## 2017-10-20 DIAGNOSIS — Z79891 Long term (current) use of opiate analgesic: Secondary | ICD-10-CM | POA: Diagnosis not present

## 2017-10-20 DIAGNOSIS — F1120 Opioid dependence, uncomplicated: Secondary | ICD-10-CM | POA: Diagnosis not present

## 2017-10-20 DIAGNOSIS — Z79899 Other long term (current) drug therapy: Secondary | ICD-10-CM | POA: Diagnosis not present

## 2017-10-20 DIAGNOSIS — M5126 Other intervertebral disc displacement, lumbar region: Secondary | ICD-10-CM | POA: Diagnosis not present

## 2017-10-20 DIAGNOSIS — Z5181 Encounter for therapeutic drug level monitoring: Secondary | ICD-10-CM | POA: Diagnosis not present

## 2017-11-17 DIAGNOSIS — I10 Essential (primary) hypertension: Secondary | ICD-10-CM | POA: Diagnosis not present

## 2017-11-17 DIAGNOSIS — M545 Low back pain: Secondary | ICD-10-CM | POA: Diagnosis not present

## 2017-11-17 DIAGNOSIS — G4700 Insomnia, unspecified: Secondary | ICD-10-CM | POA: Diagnosis not present

## 2017-11-17 DIAGNOSIS — Z79891 Long term (current) use of opiate analgesic: Secondary | ICD-10-CM | POA: Diagnosis not present

## 2017-11-17 DIAGNOSIS — E7800 Pure hypercholesterolemia, unspecified: Secondary | ICD-10-CM | POA: Diagnosis not present

## 2017-11-17 DIAGNOSIS — F17210 Nicotine dependence, cigarettes, uncomplicated: Secondary | ICD-10-CM | POA: Diagnosis not present

## 2017-11-17 DIAGNOSIS — F1120 Opioid dependence, uncomplicated: Secondary | ICD-10-CM | POA: Diagnosis not present

## 2017-11-17 DIAGNOSIS — Z79899 Other long term (current) drug therapy: Secondary | ICD-10-CM | POA: Diagnosis not present

## 2017-11-17 DIAGNOSIS — Z5181 Encounter for therapeutic drug level monitoring: Secondary | ICD-10-CM | POA: Diagnosis not present

## 2017-11-23 ENCOUNTER — Other Ambulatory Visit: Payer: Self-pay | Admitting: *Deleted

## 2017-11-23 NOTE — Patient Outreach (Signed)
[facility]) Care Management  11/23/2017  [name] Mar 19, 1956 [id number]    Member identified as high risk according to [employer] health questionnaire.  Call placed to introduce [facility] care management services and perform telephone screening.  This care manager introduced self and purpose of call.  Screening complete, member denies any needs at this time, will not open case.  Valente David, [place], MSN [facility] [phone]

## 2017-12-06 DIAGNOSIS — M5136 Other intervertebral disc degeneration, lumbar region: Secondary | ICD-10-CM | POA: Diagnosis not present

## 2017-12-08 NOTE — Discharge Instructions (Signed)
Denosumab injection °What is this medicine? °DENOSUMAB (den oh sue mab) slows bone breakdown. Prolia is used to treat osteoporosis in women after menopause and in men. Xgeva is used to treat a high calcium level due to cancer and to prevent bone fractures and other bone problems caused by multiple myeloma or cancer bone metastases. Xgeva is also used to treat giant cell tumor of the bone. °This medicine may be used for other purposes; ask your health care provider or pharmacist if you have questions. °COMMON BRAND NAME(S): Prolia, XGEVA °What should I tell my health care provider before I take this medicine? °They need to know if you have any of these conditions: °-dental disease °-having surgery or tooth extraction °-infection °-kidney disease °-low levels of calcium or Vitamin D in the blood °-malnutrition °-on hemodialysis °-skin conditions or sensitivity °-thyroid or parathyroid disease °-an unusual reaction to denosumab, other medicines, foods, dyes, or preservatives °-pregnant or trying to get pregnant °-breast-feeding °How should I use this medicine? °This medicine is for injection under the skin. It is given by a health care professional in a hospital or clinic setting. °If you are getting Prolia, a special MedGuide will be given to you by the pharmacist with each prescription and refill. Be sure to read this information carefully each time. °For Prolia, talk to your pediatrician regarding the use of this medicine in children. Special care may be needed. For Xgeva, talk to your pediatrician regarding the use of this medicine in children. While this drug may be prescribed for children as young as 13 years for selected conditions, precautions do apply. °Overdosage: If you think you have taken too much of this medicine contact a poison control center or emergency room at once. °NOTE: This medicine is only for you. Do not share this medicine with others. °What if I miss a dose? °It is important not to miss your  dose. Call your doctor or health care professional if you are unable to keep an appointment. °What may interact with this medicine? °Do not take this medicine with any of the following medications: °-other medicines containing denosumab °This medicine may also interact with the following medications: °-medicines that lower your chance of fighting infection °-steroid medicines like prednisone or cortisone °This list may not describe all possible interactions. Give your health care provider a list of all the medicines, herbs, non-prescription drugs, or dietary supplements you use. Also tell them if you smoke, drink alcohol, or use illegal drugs. Some items may interact with your medicine. °What should I watch for while using this medicine? °Visit your doctor or health care professional for regular checks on your progress. Your doctor or health care professional may order blood tests and other tests to see how you are doing. °Call your doctor or health care professional for advice if you get a fever, chills or sore throat, or other symptoms of a cold or flu. Do not treat yourself. This drug may decrease your body's ability to fight infection. Try to avoid being around people who are sick. °You should make sure you get enough calcium and vitamin D while you are taking this medicine, unless your doctor tells you not to. Discuss the foods you eat and the vitamins you take with your health care professional. °See your dentist regularly. Brush and floss your teeth as directed. Before you have any dental work done, tell your dentist you are receiving this medicine. °Do not become pregnant while taking this medicine or for 5 months after stopping   it. Talk with your doctor or health care professional about your birth control options while taking this medicine. Women should inform their doctor if they wish to become pregnant or think they might be pregnant. There is a potential for serious side effects to an unborn child. Talk  to your health care professional or pharmacist for more information. What side effects may I notice from receiving this medicine? Side effects that you should report to your doctor or health care professional as soon as possible: -allergic reactions like skin rash, itching or hives, swelling of the face, lips, or tongue -bone pain -breathing problems -dizziness -jaw pain, especially after dental work -redness, blistering, peeling of the skin -signs and symptoms of infection like fever or chills; cough; sore throat; pain or trouble passing urine -signs of low calcium like fast heartbeat, muscle cramps or muscle pain; pain, tingling, numbness in the hands or feet; seizures -unusual bleeding or bruising -unusually weak or tired Side effects that usually do not require medical attention (report to your doctor or health care professional if they continue or are bothersome): -constipation -diarrhea -headache -joint pain -loss of appetite -muscle pain -runny nose -tiredness -upset stomach This list may not describe all possible side effects. Call your doctor for medical advice about side effects. You may report side effects to FDA at 1-800-FDA-1088. Where should I keep my medicine? This medicine is only given in a clinic, doctor's office, or other health care setting and will not be stored at home. NOTE: This sheet is a summary. It may not cover all possible information. If you have questions about this medicine, talk to your doctor, pharmacist, or health care provider.  2018 Elsevier/Gold Standard (2016-04-20 19:17:21)

## 2017-12-09 ENCOUNTER — Encounter (HOSPITAL_COMMUNITY): Payer: Self-pay

## 2017-12-09 ENCOUNTER — Encounter (HOSPITAL_COMMUNITY)
Admission: RE | Admit: 2017-12-09 | Discharge: 2017-12-09 | Disposition: A | Discharge status: Home / Self Care | Payer: PPO | Source: Ambulatory Visit | Attending: Family Medicine | Admitting: Family Medicine

## 2017-12-09 DIAGNOSIS — M1991 Primary osteoarthritis, unspecified site: Secondary | ICD-10-CM | POA: Diagnosis not present

## 2017-12-09 HISTORY — DX: Age-related osteoporosis without current pathological fracture: M81.0

## 2017-12-09 MED ORDER — DENOSUMAB 60 MG/ML ~~LOC~~ SOSY
60.0000 mg | PREFILLED_SYRINGE | Freq: Once | SUBCUTANEOUS | Status: AC
  Administered 2017-12-09: 60 mg via SUBCUTANEOUS
  Filled 2017-12-09: qty 1

## 2017-12-09 NOTE — Progress Notes (Signed)
Teaching completed regarding Prolia.  Patient verbalized understanding, signed consent stating she had received information from her physician as well. Written material provided.

## 2017-12-14 DIAGNOSIS — Z79899 Other long term (current) drug therapy: Secondary | ICD-10-CM | POA: Diagnosis not present

## 2017-12-14 DIAGNOSIS — I10 Essential (primary) hypertension: Secondary | ICD-10-CM | POA: Diagnosis not present

## 2017-12-14 DIAGNOSIS — E7800 Pure hypercholesterolemia, unspecified: Secondary | ICD-10-CM | POA: Diagnosis not present

## 2017-12-15 DIAGNOSIS — F1120 Opioid dependence, uncomplicated: Secondary | ICD-10-CM | POA: Diagnosis not present

## 2017-12-15 DIAGNOSIS — I10 Essential (primary) hypertension: Secondary | ICD-10-CM | POA: Diagnosis not present

## 2017-12-15 DIAGNOSIS — M810 Age-related osteoporosis without current pathological fracture: Secondary | ICD-10-CM | POA: Diagnosis not present

## 2017-12-15 DIAGNOSIS — Z79899 Other long term (current) drug therapy: Secondary | ICD-10-CM | POA: Diagnosis not present

## 2017-12-15 DIAGNOSIS — Z79891 Long term (current) use of opiate analgesic: Secondary | ICD-10-CM | POA: Diagnosis not present

## 2017-12-15 DIAGNOSIS — M545 Low back pain: Secondary | ICD-10-CM | POA: Diagnosis not present

## 2017-12-15 DIAGNOSIS — F321 Major depressive disorder, single episode, moderate: Secondary | ICD-10-CM | POA: Diagnosis not present

## 2017-12-15 DIAGNOSIS — M4800 Spinal stenosis, site unspecified: Secondary | ICD-10-CM | POA: Diagnosis not present

## 2017-12-15 DIAGNOSIS — E7800 Pure hypercholesterolemia, unspecified: Secondary | ICD-10-CM | POA: Diagnosis not present

## 2017-12-15 DIAGNOSIS — F419 Anxiety disorder, unspecified: Secondary | ICD-10-CM | POA: Diagnosis not present

## 2017-12-15 DIAGNOSIS — Z5181 Encounter for therapeutic drug level monitoring: Secondary | ICD-10-CM | POA: Diagnosis not present

## 2018-01-09 ENCOUNTER — Encounter: Payer: Self-pay | Admitting: Gynecologic Oncology

## 2018-01-09 ENCOUNTER — Inpatient Hospital Stay: Payer: PPO | Attending: Gynecologic Oncology | Admitting: Gynecologic Oncology

## 2018-01-09 VITALS — BP 167/97 | HR 82 | Temp 98.1°F | Resp 18 | Ht 65.0 in | Wt 139.0 lb

## 2018-01-09 DIAGNOSIS — F17210 Nicotine dependence, cigarettes, uncomplicated: Secondary | ICD-10-CM

## 2018-01-09 DIAGNOSIS — Z923 Personal history of irradiation: Secondary | ICD-10-CM | POA: Diagnosis not present

## 2018-01-09 DIAGNOSIS — C539 Malignant neoplasm of cervix uteri, unspecified: Secondary | ICD-10-CM | POA: Diagnosis not present

## 2018-01-09 DIAGNOSIS — Z90722 Acquired absence of ovaries, bilateral: Secondary | ICD-10-CM | POA: Diagnosis not present

## 2018-01-09 DIAGNOSIS — Z90710 Acquired absence of both cervix and uterus: Secondary | ICD-10-CM | POA: Diagnosis not present

## 2018-01-09 NOTE — Patient Instructions (Signed)
Please notify Dr Denman George at phone number [phone] if you notice vaginal bleeding, new pelvic or abdominal pains, bloating, feeling full easy, or a change in bladder or bowel function.   Please return to see Dr Sondra Come in December, 2019 as scheduled.  Please ask his office to contact our office after that visit and they will schedule you to see Dr Denman George in March, 2020.   If your symptoms of anal incontinence become bothersome, please notify Dr Denman George and she will refer you to a specialist for evaluation and treatment.

## 2018-01-09 NOTE — Progress Notes (Signed)
Follow-up Note: Gyn-Onc  Consult was requested by Dr. Glo Herring for the evaluation of [name] 62 y.o. female  CC:  Chief Complaint  Patient presents with  . Malignant neoplasm of cervix, unspecified site [facility])    Assessment/Plan:  [name]  is a 62 y.o.  year old with stage IA 2 cervical cancer s/p extrafascial vaginal hysterectomy with her OBGYN provider. Negative nodes on surgical assessment. Close margins on primary hysterectomy specimen.   S/p vaginal brachytherapy completed 12/18 due to close surgical margins. Complete clinical response. No evidence of recurrence on exam.   I will see her back for a surveillance visit 6 months (March, 2020) after she has seen Dr Sondra Come in 3 months.  Offered referral to colorectal specialists for anal incontinence - declined this at this time.   Next pap due in May, 2020.   HPI: [name] is a 62 year old parous woman who is seen in consultation at the request of Dr Glo Herring for cervical cancer.  The patient has a remote history of abnormal pap smears. She had an excisional procedure approximately 30 years ago. She had not had a pap smear since approximately 2005. On 09/08/16 a pap showed ASC-H.  She underwent colposcopy with directed biopsies in June, 2018. 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 The margins were negative however the deep cervical margin was 28mm.   Postop she did well. With no complaints.  The patient is a smoker with hypertension. She has only had a tubal ligation on her abdomen.  On 01/11/17 she underwent robotic assisted bilateral pelvic lymphadenectomy. Final pathology confirmed no metastatic disease to the nodes. She developed right genitofemoral nerve numbness in  the right groin postop.  Staging CT on 12/30/16 showed no apparent metastatic disease.   Interval Hx:  She was dispositioned to vaginal brachytherapy to treat the close margins on hysterectomy. Radiation treatment dates:02/22/2017 - 03/22/2017 Site/dose:VaginaCuff/ 30Gy in 5 fractions  She has been doing well since completing therapy, though does report occasional leakage of small volumes of stool with wiping after voiding urine and occasionally some leakage of anal gas that she cannot control. Not quality of life limiting per patient.   Current Meds:  Outpatient Encounter Medications as of 01/09/2018  Medication Sig  . atorvastatin (LIPITOR) 40 MG tablet Take 40 mg by mouth at bedtime.   . cholecalciferol (VITAMIN D) 1000 units tablet Take 5,000 Units by mouth once a week.  Marland Kitchen guaiFENesin (MUCINEX) 600 MG 12 hr tablet Take 1 tablet (600 mg total) by mouth 2 (two) times daily.  Marland Kitchen lisinopril (PRINIVIL,ZESTRIL) 40 MG tablet Take 40 mg by mouth daily with breakfast.   . Multiple Vitamin (MULTIVITAMIN WITH MINERALS) TABS tablet Take 1 tablet by mouth daily.  . traZODone (DESYREL) 100 MG tablet Take 100-200 mg by mouth at bedtime as needed for sleep.   . vitamin B-12 (CYANOCOBALAMIN) 500 MCG tablet Take 1,000 mcg by mouth daily.  . [DISCONTINUED] alendronate (FOSAMAX) 70 MG tablet Take 70 mg by mouth every Sunday. Take with a full glass of water on an empty stomach.  . [DISCONTINUED] Aspirin-Salicylamide-Caffeine (BC HEADACHE POWDER PO) Take 1 packet by mouth every 4 (four) hours as needed (for pain).  . chlorpheniramine-HYDROcodone (TUSSIONEX) 10-8 MG/5ML SUER Take 5 mLs by mouth  every 12 (twelve) hours as needed for cough. (Patient not taking: Reported on 01/09/2018)  . citalopram (CELEXA) 20 MG tablet Take one (1) tablet by mouth daily  . oxyCODONE-acetaminophen (PERCOCET/ROXICET) 5-325 MG tablet Take 1-2 tablets by mouth every 4 (four) hours as needed for severe pain. (Patient not  taking: Reported on 01/09/2018)   No facility-administered encounter medications on file as of 01/09/2018.     Allergy:  Allergies  Allergen Reactions  . Prednisone     Sick to stomach  . Zithromax [Azithromycin] Other (See Comments)    Blisters in mouth     Social Hx:   Social History   Socioeconomic History  . Marital status: Legally Separated    Spouse name: Not on file  . Number of children: 1  . Years of education: Not on file  . Highest education level: Not on file  Occupational History  . Not on file  Social Needs  . Financial resource strain: Not on file  . Food insecurity:    Worry: Not on file    Inability: Not on file  . Transportation needs:    Medical: Not on file    Non-medical: Not on file  Tobacco Use  . Smoking status: Current Every Day Smoker    Packs/day: 1.00    Years: 44.00    Pack years: 44.00    Types: Cigarettes  . Smokeless tobacco: Never Used  . Tobacco comment: smokes 3 cig daily  Substance and Sexual Activity  . Alcohol use: No    Comment: 01-06-2016 Per pt rarely, 02-05-2016 per pt no but 61yrs ago    . Drug use: No    Comment: 02-05-2016 per pt no but about 40 yrs ago  . Sexual activity: Not Currently    Birth control/protection: Surgical    Comment: hyst  Lifestyle  . Physical activity:    Days per week: Not on file    Minutes per session: Not on file  . Stress: Not on file  Relationships  . Social connections:    Talks on phone: Not on file    Gets together: Not on file    Attends religious service: Not on file    Active member of club or organization: Not on file    Attends meetings of clubs or organizations: Not on file    Relationship status: Not on file  . Intimate partner violence:    Fear of current or ex partner: Not on file    Emotionally abused: Not on file    Physically abused: Not on file    Forced sexual activity: Not on file  Other Topics Concern  . Not on file  Social History Narrative  . Not on file     Past Surgical Hx:  Past Surgical History:  Procedure Laterality Date  . ANTERIOR AND POSTERIOR REPAIR N/A 11/09/2016   Procedure: ANTERIOR (CYSTOCELE) AND POSTERIOR REPAIR (RECTOCELE);  Surgeon: Jonnie Kind, MD;  Location: [facility];  Service: Gynecology;  Laterality: N/A;  . BILATERAL SALPINGECTOMY Left 11/09/2016   Procedure: LEFT SALPINGECTOMY;  Surgeon: Jonnie Kind, MD;  Location: [facility];  Service: Gynecology;  Laterality: Left;  . CATARACT EXTRACTION W/PHACO Right 07/05/2016   Procedure: CATARACT EXTRACTION PHACO AND INTRAOCULAR LENS PLACEMENT (IOC);  Surgeon: Tonny Branch, MD;  Location: [facility];  Service: Ophthalmology;  Laterality: Right;  CDE: 15.41  . CATARACT EXTRACTION W/PHACO Left 07/19/2016   Procedure: CATARACT EXTRACTION PHACO AND INTRAOCULAR LENS PLACEMENT LEFT EYE CDE= 12.65;  Surgeon: Tonny Branch, MD;  Location: [facility];  Service: Ophthalmology;  Laterality: Left;  left  . ROBOTIC PELVIC AND PARA-AORTIC LYMPH NODE DISSECTION N/A 01/11/2017   Procedure: XI ROBOTIC BILATERAL PELVIC LYMPH NODE DISSECTION;  Surgeon: [name], MD;  Location: [facility];  Service: Gynecology;  Laterality: N/A;  . SALPINGOOPHORECTOMY Right 11/09/2016   Procedure: RIGHT SALPINGO OOPHORECTOMY;  Surgeon: Jonnie Kind, MD;  Location: [facility];  Service: Gynecology;  Laterality: Right;  . TUBAL LIGATION    . VAGINAL HYSTERECTOMY N/A 11/09/2016   Procedure: HYSTERECTOMY VAGINAL;  Surgeon: Jonnie Kind, MD;  Location: [facility];  Service: Gynecology;  Laterality: N/A;    Past Medical Hx:  Past Medical History:  Diagnosis Date  . Arthritis    spinal stenosis  . Back disorder    "crooked spine"  . Bulging lumbar disc   . Cervical cancer ([place])   . Complication of anesthesia    ? hypotension 10/2016 at [facility] surgery   . Depression   . H/O degenerative disc disease   . History of radiation therapy 02/22/17-03/22/17   vaginal cuff 30 Gy in 5 fractions  . Hypertension   . Hypothyroidism     patient taken off of hypothyroid med in 11/2016   . Osteoporosis   . Thyroid disease     Past Gynecological History:  Abnormal pap smears. No LMP recorded. Patient is postmenopausal.  Family Hx:  Family History  Problem Relation Age of Onset  . Hypertension Mother   . Congenital heart disease Mother   . Diabetes Mother   . Thyroid disease Brother   . Other Daughter        bowel issues    Review of Systems:  Constitutional  Feels well,    ENT Normal appearing ears and nares bilaterally Skin/Breast  No rash, sores, jaundice, itching, dryness Cardiovascular  No chest pain, shortness of breath, or edema  Pulmonary  No cough or wheeze.  Gastro Intestinal  No nausea, vomitting, or diarrhoea. No bright red blood per rectum, no abdominal pain, change in bowel movement, or constipation. + occasional anal incontinence of feces and gas Genito Urinary  No frequency, urgency, dysuria,  Musculo Skeletal  No myalgia, arthralgia, joint swelling or pain  Neurologic  + discomfort in right groin and numbness right groin Psychology  No depression, anxiety, insomnia.   Vitals:  Blood pressure (!) 167/97, pulse 82, temperature 98.1 F (36.7 C), temperature source Oral, resp. rate 18, height 5\' 5"  (1.651 m), weight 139 lb (63 kg), SpO2 97 %.  Physical Exam: WD in NAD Neck  Supple NROM, without any enlargements.  Lymph Node Survey No cervical supraclavicular or inguinal adenopathy Cardiovascular  Pulse normal rate, regularity and rhythm. S1 and S2 normal.  Lungs  Clear to auscultation bilateraly, without wheezes/crackles/rhonchi. Good air movement.  Skin  No rash/lesions/breakdown  Psychiatry  Alert and oriented to person, place, and time  Abdomen  Normoactive bowel sounds, abdomen soft, non-tender and nonobese without evidence of hernia.  Soft incisions. Back No CVA tenderness Genito Urinary  Vulva/vagina: Normal external female genitalia.  No lesions. No discharge or  bleeding.  Bladder/urethra:  No lesions or masses, well supported bladder  Vagina: No visible lesions or masses.  Adnexa: no palpable masses. Rectal  Anal sphincter tone in tact, but weak. No gross anal leakage. No rectal masses palpable.  Extremities  No bilateral cyanosis, clubbing or edema.   Thereasa Solo, MD  01/09/2018, 3:19 PM

## 2018-01-12 DIAGNOSIS — H1013 Acute atopic conjunctivitis, bilateral: Secondary | ICD-10-CM | POA: Diagnosis not present

## 2018-01-12 DIAGNOSIS — F1120 Opioid dependence, uncomplicated: Secondary | ICD-10-CM | POA: Diagnosis not present

## 2018-01-12 DIAGNOSIS — M545 Low back pain: Secondary | ICD-10-CM | POA: Diagnosis not present

## 2018-01-12 DIAGNOSIS — F419 Anxiety disorder, unspecified: Secondary | ICD-10-CM | POA: Diagnosis not present

## 2018-01-12 DIAGNOSIS — Z5181 Encounter for therapeutic drug level monitoring: Secondary | ICD-10-CM | POA: Diagnosis not present

## 2018-01-12 DIAGNOSIS — Z79899 Other long term (current) drug therapy: Secondary | ICD-10-CM | POA: Diagnosis not present

## 2018-02-09 DIAGNOSIS — R05 Cough: Secondary | ICD-10-CM | POA: Diagnosis not present

## 2018-02-09 DIAGNOSIS — R0982 Postnasal drip: Secondary | ICD-10-CM | POA: Diagnosis not present

## 2018-02-09 DIAGNOSIS — M5441 Lumbago with sciatica, right side: Secondary | ICD-10-CM | POA: Diagnosis not present

## 2018-02-09 DIAGNOSIS — M5442 Lumbago with sciatica, left side: Secondary | ICD-10-CM | POA: Diagnosis not present

## 2018-02-09 DIAGNOSIS — F419 Anxiety disorder, unspecified: Secondary | ICD-10-CM | POA: Diagnosis not present

## 2018-02-09 DIAGNOSIS — Z79891 Long term (current) use of opiate analgesic: Secondary | ICD-10-CM | POA: Diagnosis not present

## 2018-02-09 DIAGNOSIS — Z5181 Encounter for therapeutic drug level monitoring: Secondary | ICD-10-CM | POA: Diagnosis not present

## 2018-02-09 DIAGNOSIS — G8929 Other chronic pain: Secondary | ICD-10-CM | POA: Diagnosis not present

## 2018-02-09 DIAGNOSIS — Z79899 Other long term (current) drug therapy: Secondary | ICD-10-CM | POA: Diagnosis not present

## 2018-02-09 DIAGNOSIS — I10 Essential (primary) hypertension: Secondary | ICD-10-CM | POA: Diagnosis not present

## 2018-02-09 DIAGNOSIS — F1120 Opioid dependence, uncomplicated: Secondary | ICD-10-CM | POA: Diagnosis not present

## 2018-02-09 DIAGNOSIS — M545 Low back pain: Secondary | ICD-10-CM | POA: Diagnosis not present

## 2018-03-01 DIAGNOSIS — H26493 Other secondary cataract, bilateral: Secondary | ICD-10-CM | POA: Diagnosis not present

## 2018-03-01 DIAGNOSIS — H5213 Myopia, bilateral: Secondary | ICD-10-CM | POA: Diagnosis not present

## 2018-03-01 DIAGNOSIS — Z961 Presence of intraocular lens: Secondary | ICD-10-CM | POA: Diagnosis not present

## 2018-03-01 DIAGNOSIS — H524 Presbyopia: Secondary | ICD-10-CM | POA: Diagnosis not present

## 2018-03-24 DIAGNOSIS — Z01818 Encounter for other preprocedural examination: Secondary | ICD-10-CM | POA: Diagnosis not present

## 2018-03-24 DIAGNOSIS — H02831 Dermatochalasis of right upper eyelid: Secondary | ICD-10-CM | POA: Diagnosis not present

## 2018-03-24 DIAGNOSIS — H26493 Other secondary cataract, bilateral: Secondary | ICD-10-CM | POA: Diagnosis not present

## 2018-03-25 DIAGNOSIS — M5136 Other intervertebral disc degeneration, lumbar region: Secondary | ICD-10-CM | POA: Diagnosis not present

## 2018-03-28 ENCOUNTER — Emergency Department (HOSPITAL_COMMUNITY): Payer: PPO

## 2018-03-28 ENCOUNTER — Other Ambulatory Visit: Payer: Self-pay

## 2018-03-28 ENCOUNTER — Encounter (HOSPITAL_COMMUNITY): Payer: Self-pay

## 2018-03-28 ENCOUNTER — Emergency Department (HOSPITAL_COMMUNITY)
Admission: EM | Admit: 2018-03-28 | Discharge: 2018-03-28 | Disposition: A | Discharge status: Home / Self Care | Payer: PPO | Attending: Emergency Medicine | Admitting: Emergency Medicine

## 2018-03-28 DIAGNOSIS — E039 Hypothyroidism, unspecified: Secondary | ICD-10-CM | POA: Diagnosis not present

## 2018-03-28 DIAGNOSIS — Z79899 Other long term (current) drug therapy: Secondary | ICD-10-CM | POA: Diagnosis not present

## 2018-03-28 DIAGNOSIS — F17210 Nicotine dependence, cigarettes, uncomplicated: Secondary | ICD-10-CM | POA: Diagnosis not present

## 2018-03-28 DIAGNOSIS — M5442 Lumbago with sciatica, left side: Secondary | ICD-10-CM | POA: Insufficient documentation

## 2018-03-28 DIAGNOSIS — I10 Essential (primary) hypertension: Secondary | ICD-10-CM | POA: Diagnosis not present

## 2018-03-28 DIAGNOSIS — M545 Low back pain: Secondary | ICD-10-CM | POA: Diagnosis not present

## 2018-03-28 LAB — URINALYSIS, ROUTINE W REFLEX MICROSCOPIC
BACTERIA UA: NONE SEEN
Bilirubin Urine: NEGATIVE
Glucose, UA: NEGATIVE mg/dL
HGB URINE DIPSTICK: NEGATIVE
Ketones, ur: NEGATIVE mg/dL
Nitrite: NEGATIVE
PROTEIN: NEGATIVE mg/dL
SPECIFIC GRAVITY, URINE: 1.006 (ref 1.005–1.030)
pH: 7 (ref 5.0–8.0)

## 2018-03-28 MED ORDER — HYDROMORPHONE HCL 1 MG/ML IJ SOLN
1.0000 mg | Freq: Once | INTRAMUSCULAR | Status: AC
  Administered 2018-03-28: 1 mg via INTRAMUSCULAR
  Filled 2018-03-28: qty 1

## 2018-03-28 MED ORDER — OXYCODONE-ACETAMINOPHEN 5-325 MG PO TABS: 1.0000 | ORAL_TABLET | Freq: Four times a day (QID) | ORAL | 0 refills | Status: AC | PRN

## 2018-03-28 MED ORDER — OXYCODONE-ACETAMINOPHEN 5-325 MG PO TABS
1.0000 | ORAL_TABLET | Freq: Once | ORAL | Status: AC
  Administered 2018-03-28: 1 via ORAL
  Filled 2018-03-28: qty 1

## 2018-03-28 NOTE — ED Triage Notes (Signed)
Pt reports problems with lower back pain and had a steroid injection Saturday.  Reports pain is in lower back and radiates to left leg.  Denies any bowel or bladder issues.

## 2018-03-28 NOTE — ED Notes (Signed)
PT requesting more pain medication. PA made aware.

## 2018-03-28 NOTE — Discharge Instructions (Addendum)
Please see the information and instructions below regarding your visit.  Your diagnoses today include:  1. Acute left-sided low back pain with left-sided sciatica   2. Elevated blood pressure reading in office with diagnosis of hypertension    About diagnosis. Most episodes of acute low back pain are self-limited. Your exam was reassuring today that the source of your pain is not affecting the spinal cord and nerves that originate in the spinal cord.   If you have a history of disc herniation or arthritis in your spine, the nerves exiting the spine on one side get inflamed. This can cause severe pain. We call this radiculopathy. We do not always know what causes the sudden inflammation.  Tests performed today include: See side panel of your discharge paperwork for testing performed today. Vital signs are listed at the bottom of these instructions.   Your CT shows that you have a new disc bulge.  You also have a herniation at the L4-L5 level on left.  Medications prescribed:    Take any prescribed medications only as prescribed, and any over the counter medications only as directed on the packaging.  You have been prescribed Percocet for pain. This is an opioid pain medication. You may take this medication every 4-6 hours as needed for pain. Only take this medication if you need it for breakthrough pain. Y  Do not combine this medication with Tylenol, as it may increase the risk of liver problems.  Do not combine this medication with alcohol.  Please be advised to avoid driving or operating heavy machinery while taking this medication, as it may make you drowsy or impair judgment.   Please do not combine this medication with the couple pills that you have left of Norco.  Home care instructions:   Low back pain gets worse the longer you stay stationary. Please keep moving and walking as tolerated. There are exercises included in this packet to perform as tolerated for your low back pain.    Apply heat to the areas that are painful. Avoid twisting or bending your trunk to lift something. Do not lift anything above 25 lbs while recovering from this flare of low back pain.  Please follow any educational materials contained in this packet.   Follow-up instructions: Please follow-up with your primary care provider in 2 days for further evaluation of your symptoms if they are not completely improved.   Please follow up with Dr. Rolena Infante as soon as possible.   Return instructions:  Please return to the Emergency Department if you experience worsening symptoms.  Please return for any fever or chills in the setting of your back pain, weakness in the muscles of the legs, numbness in your legs and feet that is new or changing, numbness in the area where you wipe, retention of your urine, loss of bowel or bladder control, or problems with walking. Please return if you have any other emergent concerns.  Additional Information:   Your vital signs today were: BP (!) 178/97    Pulse 78    Temp 97.8 F (36.6 C)    Resp 18    Ht 5\' 6"  (1.676 m)    Wt 59.9 kg    SpO2 94%    BMI 21.31 kg/m  If your blood pressure (BP) was elevated on multiple readings during this visit above 130 for the top number or above 80 for the bottom number, please have this repeated by your primary care provider within one month. --------------  Thank you for allowing [place] to participate in your care today.

## 2018-03-28 NOTE — ED Provider Notes (Signed)
[facility] EMERGENCY DEPARTMENT Provider Note   CSN: [id number] Arrival date & time: 03/28/18  0827     History   Chief Complaint Chief Complaint  Patient presents with  . Back Pain    HPI [name] is a 62 y.o. female.  HPI   Patient is a 62 year old female with history of cervical cancer, status post radiation, currently cancer free and on surveillance for greater than 6 months, degenerative disc disease of lumbar spine, hypertension, hypothyroidism, and osteoporosis presenting for left-sided low back pain.  Patient reports that she has had progressively worsening pain over the past 3 weeks.  She reports that she rarely has an exacerbation of her back pain this severe, and is interfering with her life.  She reports that the pain radiates down the anterior left leg down to her foot.  She describes pain that feels like someone is "driving a screw into the low back".  Patient denies any recent trauma, fever chills, muscular weakness of bilateral lower extremities, saddle anesthesia, or loss of bowel or bladder control.  No history of IVDU.  Patient cervical neoplasm was local and did not spread to other organs.  Patient reports that she had a recent epidural steroid injection 4 days ago, and it did not help.  Patient reports she has as needed Norco from her oncologist that she rarely uses, but finds it is not helping her pain at present. Prescription is almost out, stating she has 2-3 pills left.   Past Medical History:  Diagnosis Date  . Arthritis    spinal stenosis  . Back disorder    "crooked spine"  . Bulging lumbar disc   . Cervical cancer ([place])   . Complication of anesthesia    ? hypotension 10/2016 at [facility] surgery   . Depression   . H/O degenerative disc disease   . History of radiation therapy 02/22/17-03/22/17   vaginal cuff 30 Gy in 5 fractions  . Hypertension   . Hypothyroidism    patient taken off of hypothyroid med in 11/2016   . Osteoporosis   .  Thyroid disease     Patient Active Problem List   Diagnosis Date Noted  . 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 Hyperthyroidism 11/19/2016  . S/P vaginal hysterectomy with anterior, and posterior repair, right salpingo-oophorectomy and left salpingectomy 11/09/2016  . Status post vaginal hysterectomy 11/09/2016  . Moderate episode of recurrent major depressive disorder ([place]) 01/06/2016    Past Surgical History:  Procedure Laterality Date  . ANTERIOR AND POSTERIOR REPAIR N/A 11/09/2016   Procedure: ANTERIOR (CYSTOCELE) AND POSTERIOR REPAIR (RECTOCELE);  Surgeon: Jonnie Kind, MD;  Location: [facility];  Service: Gynecology;  Laterality: N/A;  . BILATERAL SALPINGECTOMY Left 11/09/2016   Procedure: LEFT SALPINGECTOMY;  Surgeon: Jonnie Kind, MD;  Location: [facility];  Service: Gynecology;  Laterality: Left;  . CATARACT EXTRACTION W/PHACO Right 07/05/2016   Procedure: CATARACT EXTRACTION PHACO AND INTRAOCULAR LENS PLACEMENT (IOC);  Surgeon: Tonny Branch, MD;  Location: [facility];  Service: Ophthalmology;  Laterality: Right;  CDE: 15.41  . CATARACT EXTRACTION W/PHACO Left 07/19/2016   Procedure: CATARACT EXTRACTION PHACO AND INTRAOCULAR LENS PLACEMENT LEFT EYE CDE= 12.65;  Surgeon: Tonny Branch, MD;  Location: [facility];  Service: Ophthalmology;  Laterality: Left;  left  . ROBOTIC PELVIC AND PARA-AORTIC LYMPH NODE DISSECTION N/A 01/11/2017   Procedure: XI ROBOTIC BILATERAL PELVIC LYMPH NODE DISSECTION;  Surgeon: [name], MD;  Location: [facility];  Service: Gynecology;  Laterality: N/A;  . SALPINGOOPHORECTOMY Right 11/09/2016   Procedure: RIGHT SALPINGO OOPHORECTOMY;  Surgeon: Jonnie Kind, MD;  Location: [facility];  Service: Gynecology;  Laterality: Right;  . TUBAL LIGATION    . VAGINAL HYSTERECTOMY N/A 11/09/2016   Procedure: HYSTERECTOMY VAGINAL;  Surgeon: Jonnie Kind, MD;  Location: [facility];  Service: Gynecology;  Laterality: N/A;     OB History    Gravida  1   Para  1   Term    1   Preterm      AB      Living  1     SAB      TAB      Ectopic      Multiple      Live Births  1            Home Medications    Prior to Admission medications   Medication Sig Start Date End Date Taking? Authorizing Provider  atorvastatin (LIPITOR) 40 MG tablet Take 40 mg by mouth at bedtime.     [provider]  chlorpheniramine-HYDROcodone (TUSSIONEX) 10-8 MG/5ML SUER Take 5 mLs by mouth every 12 (twelve) hours as needed for cough. Patient not taking: Reported on 01/09/2018 03/08/17   Gery Pray, MD  cholecalciferol (VITAMIN D) 1000 units tablet Take 5,000 Units by mouth once a week.    [provider]  citalopram (CELEXA) 20 MG tablet Take one (1) tablet by mouth daily 03/09/17 06/06/17  [provider]  guaiFENesin (MUCINEX) 600 MG 12 hr tablet Take 1 tablet (600 mg total) by mouth 2 (two) times daily. 05/02/17   Gery Pray, MD  lisinopril (PRINIVIL,ZESTRIL) 40 MG tablet Take 40 mg by mouth daily with breakfast.     [provider]  Multiple Vitamin (MULTIVITAMIN WITH MINERALS) TABS tablet Take 1 tablet by mouth daily.    [provider]  oxyCODONE-acetaminophen (PERCOCET/ROXICET) 5-325 MG tablet Take 1-2 tablets by mouth every 4 (four) hours as needed for severe pain. Patient not taking: Reported on 01/09/2018 01/11/17   Lahoma Crocker, MD  traZODone (DESYREL) 100 MG tablet Take 100-200 mg by mouth at bedtime as needed for sleep.     [provider]  vitamin B-12 (CYANOCOBALAMIN) 500 MCG tablet Take 1,000 mcg by mouth daily.    [provider]    Family History Family History  Problem Relation Age of Onset  . Hypertension Mother   . Congenital heart disease Mother   . Diabetes Mother   . Thyroid disease Brother   . Other Daughter        bowel issues    Social History Social History   Tobacco Use  . Smoking status: Current Every Day Smoker    Packs/day: 1.00    Years: 44.00    Pack  years: 44.00    Types: Cigarettes  . Smokeless tobacco: Never Used  . Tobacco comment: smokes 3 cig daily  Substance Use Topics  . Alcohol use: No    Comment: 01-06-2016 Per pt rarely, 02-05-2016 per pt no but 78yrs ago    . Drug use: No    Comment: 02-05-2016 per pt no but about 40 yrs ago     Allergies   Prednisone and Zithromax [azithromycin]   Review of Systems Review of Systems  Constitutional: Negative for chills and fever.  HENT: Negative for congestion and sore throat.   Eyes: Negative for visual disturbance.  Respiratory: Negative for cough, chest tightness and shortness of breath.  Cardiovascular: Negative for chest pain.  Gastrointestinal: Negative for abdominal pain, nausea and vomiting.  Genitourinary: Negative for dysuria, flank pain, frequency and pelvic pain.  Musculoskeletal: Positive for arthralgias and back pain. Negative for myalgias.  Skin: Negative for rash.  Neurological: Positive for numbness. Negative for dizziness, syncope, weakness, light-headedness and headaches.     Physical Exam Updated Vital Signs BP (!) 181/91   Pulse 86   Temp 97.8 F (36.6 C)   Resp 18   Ht 5\' 6"  (1.676 m)   Wt 59.9 kg   SpO2 91%   BMI 21.31 kg/m   Physical Exam Vitals signs and nursing note reviewed.  Constitutional:      General: She is not in acute distress.    Appearance: She is well-developed.  HENT:     Head: Normocephalic and atraumatic.  Eyes:     Conjunctiva/sclera: Conjunctivae normal.     Pupils: Pupils are equal, round, and reactive to light.  Neck:     Musculoskeletal: Normal range of motion and neck supple.  Cardiovascular:     Rate and Rhythm: Normal rate and regular rhythm.     Heart sounds: S1 normal and S2 normal. No murmur.  Pulmonary:     Effort: Pulmonary effort is normal.     Breath sounds: Normal breath sounds. No wheezing or rales.  Abdominal:     General: There is no distension.     Palpations: Abdomen is soft.     Tenderness:  There is no abdominal tenderness. There is no guarding.  Musculoskeletal: Normal range of motion.     Comments: Spine Exam: Inspection/Palpation: No midline tenderness of cervical, thoracic, or lumbar spine.  Patient has diffuse left-sided paraspinal muscular tenderness.  No crepitus.  No step-off. Strength: 5/5 throughout LE bilaterally (hip flexion/extension, adduction/abduction; knee flexion/extension; foot dorsiflexion/plantarflexion, inversion/eversion; great toe inversion) Sensation: Intact to light touch in proximal and distal LE bilaterally, however subjectively decreased in left medial instep.  Reflexes: 2+ quadriceps and achilles reflexes Antalgic gait noted, favoring left.    Lymphadenopathy:     Cervical: No cervical adenopathy.  Skin:    General: Skin is warm and dry.     Findings: No erythema or rash.  Neurological:     Mental Status: She is alert.     Comments: Cranial nerves grossly intact. Patient moves extremities symmetrically and with good coordination.  Psychiatric:        Behavior: Behavior normal.        Thought Content: Thought content normal.        Judgment: Judgment normal.      ED Treatments / Results  Labs (all labs ordered are listed, but only abnormal results are displayed) Labs Reviewed  URINALYSIS, ROUTINE W REFLEX MICROSCOPIC - Abnormal; Notable for the following components:      Result Value   Color, Urine STRAW (*)    Leukocytes, UA TRACE (*)    All other components within normal limits  URINE CULTURE    EKG None  Radiology Ct Lumbar Spine Wo Contrast  Result Date: 03/28/2018 CLINICAL DATA:  Right low back pain radiating to the right leg. EXAM: CT LUMBAR SPINE WITHOUT CONTRAST TECHNIQUE: Multidetector CT imaging of the lumbar spine was performed without intravenous contrast administration. Multiplanar CT image reconstructions were also generated. COMPARISON:  CT abdomen 12/30/2016.  MRI 09/13/2016. FINDINGS: Segmentation: 5 lumbar type  vertebral bodies. Alignment: Straightening of the normal lumbar lordosis. Vertebrae: Chronic discogenic sclerotic endplate changes at U2-0 and L3-4. Paraspinal and  other soft tissues: Negative except for aortic atherosclerosis. Disc levels: T12-L1: No disc abnormality. Mild facet and ligamentous hypertrophy. No compressive stenosis. L1-2: Mild disc bulging. Mild facet ligamentous hypertrophy. No compressive stenosis. L2-3: Chronic disc degeneration with loss of disc height. Endplate osteophytes and bulging of the disc. Chronic sclerotic endplate changes. Mild narrowing of the lateral recesses but no definite neural compression. Degenerative changes at this level have worsened since last year. L3-4: Mild bulging of the disc. Mild bilateral lateral recess narrowing but without likely neural compression. L4-5: Circumferential bulging of the disc. Left-sided disc herniation migrated into the foramen on the left likely to compress the left L4 nerve. L5-S1: Chronic disc degeneration with loss of disc height. Chronic sclerotic endplate changes with erosions. Endplate osteophytes and bulging of the disc. Right foraminal stenosis because of encroachment by osteophytes and bulging disc material could affect the exiting right L5 nerve. Left foraminal stenosis due to encroachment by endplate osteophytes, bulging of the disc and facet arthropathy could affect the exiting left L5 nerve. IMPRESSION: Newly seen left foraminal disc herniation at L4-5 which would seem likely to compress the left L4 nerve. Chronic disc degeneration at L2-3. Bulging of the disc. Mild narrowing of the lateral recesses but no visible neural compression. This could be associated with back pain. Disc degeneration is worsened since last year. L5-S1 chronic disc degeneration which could be associated with back pain. Right foraminal stenosis due to encroachment by osteophyte and disc material could compress the exiting right L5 nerve. Left foraminal narrowing  because of encroachment by endplate osteophytes, bulging disc and facet arthropathy could affect the exiting left L5 nerve. Electronically Signed   By: Nelson Chimes M.D.   On: 03/28/2018 10:24    Procedures Procedures (including critical care time)  Medications Ordered in ED Medications  oxyCODONE-acetaminophen (PERCOCET/ROXICET) 5-325 MG per tablet 1 tablet (1 tablet Oral Given 03/28/18 0913)     Initial Impression / Assessment and Plan / ED Course  I have reviewed the triage vital signs and the nursing notes.  Pertinent labs & imaging results that were available during my care of the patient were reviewed by me and considered in my medical decision making (see chart for details).  Clinical Course as of Mar 28 925  Tue Mar 28, 2018  7209 Patient verbally verified a safe ride from the ED. Proceeded with prescribing Percocet for pain/relaxtion/muscle relaxation in the ED.   [AM]    Clinical Course User Index [AM] Albesa Seen, PA-C    Patient denies any concerning symptoms suggestive of cauda equina requiring urgent imaging at this time such as loss of sensation in the lower extremities, lower extremity weakness, loss of bowel or bladder control, saddle anesthesia, urinary retention, fever/chills, IVDU. Exam demonstrated no  weakness on exam today. No preceding injury or trauma to suggest acute fracture. Doubt pelvic or urinary pathology for patient's acute back pain, as patient denies urinary symptoms, has no evidence of infection on UA, has no CVA tenderness, history/pain not consistent with nephrolithiasis. Doubt AAA as cause of patient's back pain as patient symptoms are radicular in nature and has imaging findings to suggest this, and has symmetric and intact distal pulses.  Patient has CT of lumbar spine which shows no compression fractures, but does show a new left-sided disc herniation.  This is likely the cause of her severe symptoms.  Patient has care with a spine specialist,  and will refer her there.  Patient given strict return precautions for any  symptoms indicating worsening neurologic function in the lower extremities.  I have reviewed the patient's information in the [facility] for the past 12 months and found them to have no overlapping Rx.  Opiates were prescribed for an acute, painful condition. The patient was given information on side effects and encouraged to use other, non-opiate pain medication primary, only using opiate medicine sparingly for severe pain.  This is a supervised visit with Dr. Sherwood Gambler. Evaluation, management, and discharge planning discussed with this attending physician.  Final Clinical Impressions(s) / ED Diagnoses   Final diagnoses:  Acute left-sided low back pain with left-sided sciatica  Elevated blood pressure reading in office with diagnosis of hypertension    ED Discharge Orders         Ordered    Ambulatory referral to Physical Therapy    Comments:  For new disc herniation of left side.   03/28/18 1304    oxyCODONE-acetaminophen (PERCOCET/ROXICET) 5-325 MG tablet  Every 6 hours PRN     03/28/18 1306           Tamala Julian 03/28/18 1308    Sherwood Gambler, MD 03/29/18 1622

## 2018-03-28 NOTE — ED Notes (Signed)
PT discharged and left ED at 1345.

## 2018-03-30 DIAGNOSIS — Z79899 Other long term (current) drug therapy: Secondary | ICD-10-CM | POA: Diagnosis not present

## 2018-03-30 DIAGNOSIS — Z79891 Long term (current) use of opiate analgesic: Secondary | ICD-10-CM | POA: Diagnosis not present

## 2018-03-30 DIAGNOSIS — Z5181 Encounter for therapeutic drug level monitoring: Secondary | ICD-10-CM | POA: Diagnosis not present

## 2018-03-30 DIAGNOSIS — I10 Essential (primary) hypertension: Secondary | ICD-10-CM | POA: Diagnosis not present

## 2018-03-30 DIAGNOSIS — M545 Low back pain: Secondary | ICD-10-CM | POA: Diagnosis not present

## 2018-03-30 DIAGNOSIS — F17210 Nicotine dependence, cigarettes, uncomplicated: Secondary | ICD-10-CM | POA: Diagnosis not present

## 2018-03-30 DIAGNOSIS — F1120 Opioid dependence, uncomplicated: Secondary | ICD-10-CM | POA: Diagnosis not present

## 2018-03-30 DIAGNOSIS — E7800 Pure hypercholesterolemia, unspecified: Secondary | ICD-10-CM | POA: Diagnosis not present

## 2018-03-30 LAB — URINE CULTURE: Culture: NO GROWTH

## 2018-04-10 ENCOUNTER — Encounter: Payer: Self-pay | Admitting: Radiation Oncology

## 2018-04-10 ENCOUNTER — Other Ambulatory Visit: Payer: Self-pay

## 2018-04-10 ENCOUNTER — Ambulatory Visit
Admission: RE | Admit: 2018-04-10 | Discharge: 2018-04-10 | Disposition: A | Discharge status: Home / Self Care | Payer: PPO | Source: Ambulatory Visit | Attending: Radiation Oncology | Admitting: Radiation Oncology

## 2018-04-10 VITALS — BP 170/92 | HR 82 | Temp 98.0°F | Resp 18 | Ht 64.0 in | Wt 132.0 lb

## 2018-04-10 DIAGNOSIS — Z08 Encounter for follow-up examination after completed treatment for malignant neoplasm: Secondary | ICD-10-CM | POA: Diagnosis not present

## 2018-04-10 DIAGNOSIS — C530 Malignant neoplasm of endocervix: Secondary | ICD-10-CM | POA: Insufficient documentation

## 2018-04-10 DIAGNOSIS — Z79899 Other long term (current) drug therapy: Secondary | ICD-10-CM | POA: Insufficient documentation

## 2018-04-10 DIAGNOSIS — Z8542 Personal history of malignant neoplasm of other parts of uterus: Secondary | ICD-10-CM | POA: Diagnosis not present

## 2018-04-10 NOTE — Progress Notes (Signed)
Pt presents today for f/u visit with Dr. Sondra Come. Pt reports c/o pain in lower back, rated 8/10. Recently evaluated in ED for pain and sees Dr. Rolena Infante tomorrow am. Pt denies dysuria/hematuria. Pt denies vaginal bleeding/discharge. Pt denies rectal bleeding. Pt reports constipation related to pain medications. Pt denies abdominal bloating, N/V.  BP (!) 170/92   Pulse 82   Temp 98 F (36.7 C) (Oral)   Resp 18   Ht 5\' 4"  (1.626 m)   Wt 132 lb (59.9 kg)   SpO2 93%   BMI 22.66 kg/m   Wt Readings from Last 3 Encounters:  04/10/18 132 lb (59.9 kg)  03/28/18 132 lb (59.9 kg)  01/09/18 139 lb (63 kg)   Loma Sousa, RN BSN

## 2018-04-10 NOTE — Progress Notes (Signed)
Radiation Oncology         [phone] ________________________________  Name: Schroeder Schroeder MRN: [id number]  Date: 04/10/2018  DOB: [birth year]  Follow-Up Visit Note  CC: Amber Gravel, MD  Schroeder Amber, MD    ICD-10-CM   1. Malignant neoplasm of endocervix Schroeder Schroeder - [place]) C53.0     Diagnosis:   62 y.o. 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 She has had recurrent back pain recently and she will follow up with her orthopedist, Dr. Rolena Infante tomorrow. She used to have steroid injections that alleviated her symptoms, but didn't work recently. She is currently taking percocet for her pain. She has to sleep in her recliner due to her back pain.   She has has constipation due to her recent pain medications. She does note that she used to have "mushy" stools, however, that has resolved. She denies any recent issues with fecal incontinence. She does smoke cigarettes and consumes 1.5 PPD currently. We offered smoking cessation approaches but she is not instructed at this time                 ALLERGIES:  is allergic to prednisone and zithromax [azithromycin].  Meds: Current Outpatient Medications  Medication Sig Dispense Refill  . Aspirin-Salicylamide-Caffeine (BC HEADACHE POWDER PO) Take 1 Package by mouth 3 (three) times daily as needed.    Marland Kitchen atorvastatin (LIPITOR) 40 MG tablet Take 40 mg by mouth at bedtime.     . calcium carbonate (OSCAL) 1500 (600 Ca) MG TABS tablet Take 600 mg of elemental calcium by mouth daily with breakfast.    . cholecalciferol (VITAMIN D) 1000 units tablet Take 1,000 Units by mouth daily.     . citalopram (CELEXA) 20 MG tablet Take one (1) tablet by mouth daily    .  lisinopril (PRINIVIL,ZESTRIL) 40 MG tablet Take 40 mg by mouth daily with breakfast.     . oxyCODONE-acetaminophen (PERCOCET/ROXICET) 5-325 MG tablet Take 1 tablet by mouth every 6 (six) hours as needed for severe pain.    . traZODone (DESYREL) 100 MG tablet Take 100-200 mg by mouth at bedtime as needed for sleep.     . vitamin B-12 (CYANOCOBALAMIN) 500 MCG tablet Take 1,000 mcg by mouth daily.    Marland Kitchen HYDROcodone-acetaminophen (NORCO/VICODIN) 5-325 MG tablet Take 1 tablet by mouth every 6 (six) hours as needed for moderate pain.     No current facility-administered medications for this encounter.     Physical Findings: The patient is in no acute distress. Patient is alert and oriented.  height is 5\' 4"  (1.626 m) and weight is 132 lb (59.9 kg). Her oral temperature is 98 F (36.7 C). Her blood pressure is 170/92 (abnormal) and her pulse is 82. Her respiration is 18 and oxygen saturation is 93%.   Lungs have some expiratory wheezing bilaterally. Heart has regular rate and rhythm. No palpable cervical, supraclavicular, or axillary adenopathy. Abdomen soft, non-tender, normal bowel sounds.  On pelvic examination the external genitalia were unremarkable. A speculum exam was performed. There are no mucosal lesions noted in the vaginal vault. On bimanual and rectovaginal examination there were no pelvic masses appreciated. On the rectal exam, reveals spinchter tone to be slightly decreased.   Lab Findings: Lab Results  Component Value Date  WBC 14.5 (H) 01/05/2017   HGB 14.2 01/05/2017   HCT 41.5 01/05/2017   MCV 88.5 01/05/2017   PLT 307 01/05/2017    Radiographic Findings: Ct Lumbar Spine Wo Contrast  Result Date: 03/28/2018 CLINICAL DATA:  Right low back pain radiating to the right leg. EXAM: CT LUMBAR SPINE WITHOUT CONTRAST TECHNIQUE: Multidetector CT imaging of the lumbar spine was performed without intravenous contrast administration. Multiplanar CT image reconstructions were also  generated. COMPARISON:  CT abdomen 12/30/2016.  MRI 09/13/2016. FINDINGS: Segmentation: 5 lumbar type vertebral bodies. Alignment: Straightening of the normal lumbar lordosis. Vertebrae: Chronic discogenic sclerotic endplate changes at V6-9 and L3-4. Paraspinal and other soft tissues: Negative except for aortic atherosclerosis. Disc levels: T12-L1: No disc abnormality. Mild facet and ligamentous hypertrophy. No compressive stenosis. L1-2: Mild disc bulging. Mild facet ligamentous hypertrophy. No compressive stenosis. L2-3: Chronic disc degeneration with loss of disc height. Endplate osteophytes and bulging of the disc. Chronic sclerotic endplate changes. Mild narrowing of the lateral recesses but no definite neural compression. Degenerative changes at this level have worsened since last year. L3-4: Mild bulging of the disc. Mild bilateral lateral recess narrowing but without likely neural compression. L4-5: Circumferential bulging of the disc. Left-sided disc herniation migrated into the foramen on the left likely to compress the left L4 nerve. L5-S1: Chronic disc degeneration with loss of disc height. Chronic sclerotic endplate changes with erosions. Endplate osteophytes and bulging of the disc. Right foraminal stenosis because of encroachment by osteophytes and bulging disc material could affect the exiting right L5 nerve. Left foraminal stenosis due to encroachment by endplate osteophytes, bulging of the disc and facet arthropathy could affect the exiting left L5 nerve. IMPRESSION: Newly seen left foraminal disc herniation at L4-5 which would seem likely to compress the left L4 nerve. Chronic disc degeneration at L2-3. Bulging of the disc. Mild narrowing of the lateral recesses but no visible neural compression. This could be associated with back pain. Disc degeneration is worsened since last year. L5-S1 chronic disc degeneration which could be associated with back pain. Right foraminal stenosis due to  encroachment by osteophyte and disc material could compress the exiting right L5 nerve. Left foraminal narrowing because of encroachment by endplate osteophytes, bulging disc and facet arthropathy could affect the exiting left L5 nerve. Electronically Signed   By: Nelson Chimes M.D.   On: 03/28/2018 10:24    Impression:  No evidence of recurrence on clinical exam.  Plan:  Patient will follow up with Dr. Denman George in 3 months. Follow up in radiation oncology in 6 months.   ____________________________________  Blair Promise, PhD, MD  This document serves as a record of services personally performed by Gery Pray, MD. It was created on his behalf by [facility], a trained medical scribe. The creation of this record is based on the scribe's personal observations and the provider's statements to them. This document has been checked and approved by the attending provider.

## 2018-04-11 ENCOUNTER — Telehealth: Payer: Self-pay | Admitting: *Deleted

## 2018-04-11 DIAGNOSIS — M5136 Other intervertebral disc degeneration, lumbar region: Secondary | ICD-10-CM | POA: Diagnosis not present

## 2018-04-11 DIAGNOSIS — M545 Low back pain: Secondary | ICD-10-CM | POA: Diagnosis not present

## 2018-04-11 NOTE — Telephone Encounter (Signed)
CALLED PATIENT TO INFORM OF FU WITH DR. Denman George ON 07-03-18- ARRIVAL TIME - 2:15 PM, SPOKE WITH PATIENT AND SHE IS AWARE OF THIS APPT.

## 2018-04-14 ENCOUNTER — Telehealth (HOSPITAL_COMMUNITY): Payer: Self-pay | Admitting: Internal Medicine

## 2018-04-18 DIAGNOSIS — M545 Low back pain: Secondary | ICD-10-CM | POA: Diagnosis not present

## 2018-04-25 DIAGNOSIS — M5417 Radiculopathy, lumbosacral region: Secondary | ICD-10-CM | POA: Diagnosis not present

## 2018-04-25 DIAGNOSIS — M5136 Other intervertebral disc degeneration, lumbar region: Secondary | ICD-10-CM | POA: Diagnosis not present

## 2018-04-25 DIAGNOSIS — M5116 Intervertebral disc disorders with radiculopathy, lumbar region: Secondary | ICD-10-CM | POA: Diagnosis not present

## 2018-04-27 DIAGNOSIS — H02831 Dermatochalasis of right upper eyelid: Secondary | ICD-10-CM | POA: Diagnosis not present

## 2018-04-27 DIAGNOSIS — H02834 Dermatochalasis of left upper eyelid: Secondary | ICD-10-CM | POA: Diagnosis not present

## 2018-05-03 DIAGNOSIS — E7800 Pure hypercholesterolemia, unspecified: Secondary | ICD-10-CM | POA: Diagnosis not present

## 2018-05-03 DIAGNOSIS — M5442 Lumbago with sciatica, left side: Secondary | ICD-10-CM | POA: Diagnosis not present

## 2018-05-03 DIAGNOSIS — M5441 Lumbago with sciatica, right side: Secondary | ICD-10-CM | POA: Diagnosis not present

## 2018-05-03 DIAGNOSIS — I10 Essential (primary) hypertension: Secondary | ICD-10-CM | POA: Diagnosis not present

## 2018-05-03 DIAGNOSIS — M545 Low back pain: Secondary | ICD-10-CM | POA: Diagnosis not present

## 2018-05-03 DIAGNOSIS — Z79891 Long term (current) use of opiate analgesic: Secondary | ICD-10-CM | POA: Diagnosis not present

## 2018-05-03 DIAGNOSIS — Z5181 Encounter for therapeutic drug level monitoring: Secondary | ICD-10-CM | POA: Diagnosis not present

## 2018-05-03 DIAGNOSIS — G8929 Other chronic pain: Secondary | ICD-10-CM | POA: Diagnosis not present

## 2018-05-03 DIAGNOSIS — R062 Wheezing: Secondary | ICD-10-CM | POA: Diagnosis not present

## 2018-05-03 DIAGNOSIS — F1120 Opioid dependence, uncomplicated: Secondary | ICD-10-CM | POA: Diagnosis not present

## 2018-05-03 DIAGNOSIS — Z79899 Other long term (current) drug therapy: Secondary | ICD-10-CM | POA: Diagnosis not present

## 2018-05-18 ENCOUNTER — Telehealth: Payer: Self-pay | Admitting: Surgery

## 2018-05-18 ENCOUNTER — Other Ambulatory Visit: Payer: Self-pay | Admitting: *Deleted

## 2018-05-18 NOTE — Telephone Encounter (Signed)
-----   Message from Willy Eddy, RN sent at 05/18/2018  1:05 PM EST ----- Regarding: Office appointment with Dr. Trula Slade Please schedule an office appointment with Dr. Trula Slade. ALIF with Dr. Rolena Infante scheduled for 07/26/2018.(approx. 2 weeks prior) Remind patient to bring all films/etc. related to this surgery with her to this app. Thanks, B

## 2018-05-18 NOTE — Telephone Encounter (Signed)
sch appt spk to pt mld ltr 07/10/2018 115am f/u MD

## 2018-05-19 ENCOUNTER — Encounter (HOSPITAL_COMMUNITY): Payer: Self-pay

## 2018-05-19 ENCOUNTER — Encounter (HOSPITAL_COMMUNITY)
Admission: RE | Admit: 2018-05-19 | Discharge: 2018-05-19 | Disposition: A | Discharge status: Home / Self Care | Payer: PPO | Source: Ambulatory Visit | Attending: Family Medicine | Admitting: Family Medicine

## 2018-05-19 DIAGNOSIS — M47816 Spondylosis without myelopathy or radiculopathy, lumbar region: Secondary | ICD-10-CM | POA: Diagnosis not present

## 2018-05-19 MED ORDER — DENOSUMAB 60 MG/ML ~~LOC~~ SOSY
60.0000 mg | PREFILLED_SYRINGE | Freq: Once | SUBCUTANEOUS | Status: AC
  Administered 2018-05-19: 60 mg via SUBCUTANEOUS

## 2018-05-23 ENCOUNTER — Ambulatory Visit: Payer: Self-pay | Admitting: Orthopedic Surgery

## 2018-06-02 ENCOUNTER — Other Ambulatory Visit: Payer: Self-pay | Admitting: *Deleted

## 2018-06-07 DIAGNOSIS — F1120 Opioid dependence, uncomplicated: Secondary | ICD-10-CM | POA: Diagnosis not present

## 2018-06-07 DIAGNOSIS — M545 Low back pain: Secondary | ICD-10-CM | POA: Diagnosis not present

## 2018-06-07 DIAGNOSIS — M5126 Other intervertebral disc displacement, lumbar region: Secondary | ICD-10-CM | POA: Diagnosis not present

## 2018-06-07 DIAGNOSIS — Z79891 Long term (current) use of opiate analgesic: Secondary | ICD-10-CM | POA: Diagnosis not present

## 2018-06-07 DIAGNOSIS — Z5181 Encounter for therapeutic drug level monitoring: Secondary | ICD-10-CM | POA: Diagnosis not present

## 2018-06-07 DIAGNOSIS — M810 Age-related osteoporosis without current pathological fracture: Secondary | ICD-10-CM | POA: Diagnosis not present

## 2018-06-07 DIAGNOSIS — I10 Essential (primary) hypertension: Secondary | ICD-10-CM | POA: Diagnosis not present

## 2018-06-07 DIAGNOSIS — Z79899 Other long term (current) drug therapy: Secondary | ICD-10-CM | POA: Diagnosis not present

## 2018-06-07 DIAGNOSIS — M5442 Lumbago with sciatica, left side: Secondary | ICD-10-CM | POA: Diagnosis not present

## 2018-06-12 NOTE — Telephone Encounter (Signed)
Provider notified

## 2018-06-13 ENCOUNTER — Ambulatory Visit: Payer: Self-pay | Admitting: Gynecologic Oncology

## 2018-06-28 ENCOUNTER — Telehealth: Payer: Self-pay | Admitting: *Deleted

## 2018-06-28 NOTE — Telephone Encounter (Signed)
Called and spoke with the patient. Rescheduled her appt from Monday 3/23 to 5/1. Explained to the patient that the appt may have to be moved again due to the coronavirus

## 2018-07-03 ENCOUNTER — Ambulatory Visit: Payer: Self-pay | Admitting: Gynecologic Oncology

## 2018-07-06 DIAGNOSIS — I10 Essential (primary) hypertension: Secondary | ICD-10-CM | POA: Diagnosis not present

## 2018-07-06 DIAGNOSIS — M545 Low back pain: Secondary | ICD-10-CM | POA: Diagnosis not present

## 2018-07-06 DIAGNOSIS — M5442 Lumbago with sciatica, left side: Secondary | ICD-10-CM | POA: Diagnosis not present

## 2018-07-06 DIAGNOSIS — F321 Major depressive disorder, single episode, moderate: Secondary | ICD-10-CM | POA: Diagnosis not present

## 2018-07-06 DIAGNOSIS — F419 Anxiety disorder, unspecified: Secondary | ICD-10-CM | POA: Diagnosis not present

## 2018-07-06 DIAGNOSIS — Z79899 Other long term (current) drug therapy: Secondary | ICD-10-CM | POA: Diagnosis not present

## 2018-07-06 DIAGNOSIS — Z79891 Long term (current) use of opiate analgesic: Secondary | ICD-10-CM | POA: Diagnosis not present

## 2018-07-10 ENCOUNTER — Ambulatory Visit: Payer: Self-pay | Admitting: Surgery

## 2018-07-26 ENCOUNTER — Inpatient Hospital Stay (HOSPITAL_COMMUNITY): Admission: RE | Admit: 2018-07-26 | Payer: PPO | Source: Home / Self Care | Admitting: Orthopedic Surgery

## 2018-07-26 ENCOUNTER — Encounter (HOSPITAL_COMMUNITY): Admission: RE | Payer: Self-pay | Source: Home / Self Care

## 2018-07-26 SURGERY — ANTERIOR LUMBAR FUSION 1 LEVEL
Anesthesia: General

## 2018-08-07 ENCOUNTER — Telehealth: Payer: Self-pay | Admitting: *Deleted

## 2018-08-07 NOTE — Telephone Encounter (Signed)
Called and left the patient a message to call the office back. Need to change her appt to a phone visit

## 2018-08-08 ENCOUNTER — Telehealth: Payer: Self-pay | Admitting: *Deleted

## 2018-08-08 NOTE — Telephone Encounter (Signed)
RETURNED PATIENT'S PHONE CALL, SPOKE WITH PATIENT. ?

## 2018-08-09 DIAGNOSIS — M545 Low back pain: Secondary | ICD-10-CM | POA: Diagnosis not present

## 2018-08-09 DIAGNOSIS — F419 Anxiety disorder, unspecified: Secondary | ICD-10-CM | POA: Diagnosis not present

## 2018-08-09 DIAGNOSIS — G4700 Insomnia, unspecified: Secondary | ICD-10-CM | POA: Diagnosis not present

## 2018-08-09 DIAGNOSIS — I10 Essential (primary) hypertension: Secondary | ICD-10-CM | POA: Diagnosis not present

## 2018-08-09 DIAGNOSIS — G8929 Other chronic pain: Secondary | ICD-10-CM | POA: Diagnosis not present

## 2018-08-09 DIAGNOSIS — F17210 Nicotine dependence, cigarettes, uncomplicated: Secondary | ICD-10-CM | POA: Diagnosis not present

## 2018-08-09 DIAGNOSIS — Z0000 Encounter for general adult medical examination without abnormal findings: Secondary | ICD-10-CM | POA: Diagnosis not present

## 2018-08-09 DIAGNOSIS — Z79891 Long term (current) use of opiate analgesic: Secondary | ICD-10-CM | POA: Diagnosis not present

## 2018-08-09 DIAGNOSIS — F321 Major depressive disorder, single episode, moderate: Secondary | ICD-10-CM | POA: Diagnosis not present

## 2018-08-11 ENCOUNTER — Ambulatory Visit: Payer: Self-pay | Admitting: Gynecologic Oncology

## 2018-08-22 ENCOUNTER — Telehealth: Payer: Self-pay | Admitting: Surgery

## 2018-08-22 NOTE — Telephone Encounter (Signed)
-----   Message from Willy Eddy, RN sent at 08/21/2018  3:08 PM EDT ----- Regarding: office appointment Please schedule office appointment with Dr. Trula Slade. "ALIF L5-S1 with Dr. Rolena Infante" scheduled for 09/27/2018.Remind patient to bring all films, MRI/ CT anything pertaining to this surgery to this office appointment. Thank you, B

## 2018-08-22 NOTE — Telephone Encounter (Signed)
sch appt lvm mld ltr 09/11/2018 9am f/u MD

## 2018-08-23 ENCOUNTER — Other Ambulatory Visit: Payer: Self-pay | Admitting: *Deleted

## 2018-09-05 DIAGNOSIS — I10 Essential (primary) hypertension: Secondary | ICD-10-CM | POA: Diagnosis not present

## 2018-09-05 DIAGNOSIS — Z79899 Other long term (current) drug therapy: Secondary | ICD-10-CM | POA: Diagnosis not present

## 2018-09-05 DIAGNOSIS — E559 Vitamin D deficiency, unspecified: Secondary | ICD-10-CM | POA: Diagnosis not present

## 2018-09-05 DIAGNOSIS — R739 Hyperglycemia, unspecified: Secondary | ICD-10-CM | POA: Diagnosis not present

## 2018-09-05 DIAGNOSIS — Z0001 Encounter for general adult medical examination with abnormal findings: Secondary | ICD-10-CM | POA: Diagnosis not present

## 2018-09-11 ENCOUNTER — Ambulatory Visit (INDEPENDENT_AMBULATORY_CARE_PROVIDER_SITE_OTHER): Payer: PPO | Admitting: Surgery

## 2018-09-11 ENCOUNTER — Other Ambulatory Visit: Payer: Self-pay

## 2018-09-11 ENCOUNTER — Encounter: Payer: Self-pay | Admitting: Surgery

## 2018-09-11 VITALS — BP 129/81 | HR 79 | Temp 98.3°F | Resp 20 | Ht 64.0 in | Wt 143.5 lb

## 2018-09-11 DIAGNOSIS — M5136 Other intervertebral disc degeneration, lumbar region: Secondary | ICD-10-CM | POA: Diagnosis not present

## 2018-09-11 NOTE — Progress Notes (Signed)
Vascular and Vein Specialist of   Patient name: [name] MRN: [id number] DOB: [birth year] Sex: female   REQUESTING PROVIDER:    Dr. Rolena Infante    REASON FOR CONSULT:    Anterior approach for spinal surgery  HISTORY OF PRESENT ILLNESS:   [name] is a 63 y.o. female, who is referred today for evaluation of L5-S1 anterior approach.  The patient has significant left leg pain which is made worse by walking.  She is taking narcotics to alleviate her pain.  She did not get any relief from Neurontin.  She is a smoker.  She does have a history of cervical cancer and has undergone 5 cycles of radiation as well as vaginal hysterectomy and pelvic lymph node dissection.  PAST MEDICAL HISTORY    Past Medical History:  Diagnosis Date  . Arthritis    spinal stenosis  . Back disorder    "crooked spine"  . Bulging lumbar disc   . Cervical cancer ([place])   . Complication of anesthesia    ? hypotension 10/2016 at [facility] surgery   . Depression   . H/O degenerative disc disease   . History of radiation therapy 02/22/17-03/22/17   vaginal cuff 30 Gy in 5 fractions  . Hypertension   . Hypothyroidism    patient taken off of hypothyroid med in 11/2016   . Osteoporosis   . Thyroid disease      FAMILY HISTORY   Family History  Problem Relation Age of Onset  . Hypertension Mother   . Congenital heart disease Mother   . Diabetes Mother   . Thyroid disease Brother   . Other Daughter        bowel issues    SOCIAL HISTORY:   Social History   Socioeconomic History  . Marital status: Legally Separated    Spouse name: Not on file  . Number of children: 1  . Years of education: Not on file  . Highest education level: Not on file  Occupational History  . Not on file  Social Needs  . Financial resource strain: Not on file  . Food insecurity:    Worry: Not on file    Inability: Not on file  . Transportation needs:    Medical:  Not on file    Non-medical: Not on file  Tobacco Use  . Smoking status: Current Every Day Smoker    Packs/day: 1.00    Years: 44.00    Pack years: 44.00    Types: Cigarettes  . Smokeless tobacco: Never Used  . Tobacco comment: smokes 3 cig daily  Substance and Sexual Activity  . Alcohol use: No    Comment: 01-06-2016 Per pt rarely, 02-05-2016 per pt no but 78yrs ago    . Drug use: No    Comment: 02-05-2016 per pt no but about 40 yrs ago  . Sexual activity: Not Currently    Birth control/protection: Surgical    Comment: hyst  Lifestyle  . Physical activity:    Days per week: Not on file    Minutes per session: Not on file  . Stress: Not on file  Relationships  . Social connections:    Talks on phone: Not on file    Gets together: Not on file    Attends religious service: Not on file    Active member of club or organization: Not on file    Attends meetings of clubs or organizations: Not on file    Relationship status: Not  on file  . Intimate partner violence:    Fear of current or ex partner: Not on file    Emotionally abused: Not on file    Physically abused: Not on file    Forced sexual activity: Not on file  Other Topics Concern  . Not on file  Social History Narrative  . Not on file    ALLERGIES:    Allergies  Allergen Reactions  . Prednisone     Sick to stomach  . Zithromax [Azithromycin] Other (See Comments)    Blisters in mouth     CURRENT MEDICATIONS:    Current Outpatient Medications  Medication Sig Dispense Refill  . atorvastatin (LIPITOR) 40 MG tablet Take 40 mg by mouth at bedtime.     . calcium carbonate (OSCAL) 1500 (600 Ca) MG TABS tablet Take 600 mg of elemental calcium by mouth daily with breakfast.    . cholecalciferol (VITAMIN D) 1000 units tablet Take 1,000 Units by mouth daily.     . hydrOXYzine (ATARAX/VISTARIL) 25 MG tablet     . lisinopril (PRINIVIL,ZESTRIL) 40 MG tablet Take 40 mg by mouth daily with breakfast.     .  oxyCODONE-acetaminophen (PERCOCET/ROXICET) 5-325 MG tablet Take 1 tablet by mouth every 6 (six) hours as needed for severe pain.    Marland Kitchen tiZANidine (ZANAFLEX) 4 MG tablet tizanidine 4 mg tablet    . traZODone (DESYREL) 100 MG tablet Take 100-200 mg by mouth at bedtime as needed for sleep.     . vitamin B-12 (CYANOCOBALAMIN) 500 MCG tablet Take 1,000 mcg by mouth daily.    Marland Kitchen amLODipine (NORVASC) 5 MG tablet     . Aspirin-Salicylamide-Caffeine (BC HEADACHE POWDER PO) Take 1 Package by mouth 3 (three) times daily as needed.    . citalopram (CELEXA) 20 MG tablet Take one (1) tablet by mouth daily    . citalopram (CELEXA) 40 MG tablet     . denosumab (PROLIA) 60 MG/ML SOSY injection Inject 60 mg into the skin every 6 (six) months.    Marland Kitchen HYDROcodone-acetaminophen (NORCO/VICODIN) 5-325 MG tablet Take 1 tablet by mouth every 6 (six) hours as needed for moderate pain.    . mirtazapine (REMERON) 30 MG tablet     . PROAIR HFA 108 (90 Base) MCG/ACT inhaler      No current facility-administered medications for this visit.     REVIEW OF SYSTEMS:   [X]  denotes positive finding, [ ]  denotes negative finding Cardiac  Comments:  Chest pain or chest pressure:    Shortness of breath upon exertion:    Short of breath when lying flat:    Irregular heart rhythm:        Vascular    Pain in calf, thigh, or hip brought on by ambulation:    Pain in feet at night that wakes you up from your sleep:     Blood clot in your veins:    Leg swelling:         Pulmonary    Oxygen at home:    Productive cough:     Wheezing:         Neurologic    Sudden weakness in arms or legs:     Sudden numbness in arms or legs:     Sudden onset of difficulty speaking or slurred speech:    Temporary loss of vision in one eye:     Problems with dizziness:         Gastrointestinal    Blood in stool:  Vomited blood:         Genitourinary    Burning when urinating:     Blood in urine:        Psychiatric    Major  depression:         Hematologic    Bleeding problems:    Problems with blood clotting too easily:        Skin    Rashes or ulcers:        Constitutional    Fever or chills:     PHYSICAL EXAM:   Vitals:   09/11/18 0846  BP: 129/81  Pulse: 79  Resp: 20  Temp: 98.3 F (36.8 C)  SpO2: 93%  Weight: 65.1 kg  Height: 5\' 4"  (1.626 m)    GENERAL: The patient is a well-nourished female, in no acute distress. The vital signs are documented above. CARDIAC: There is a regular rate and rhythm.  VASCULAR: Palpable posterior tibial and dorsalis pedis pulse on the left PULMONARY: Nonlabored respirations ABDOMEN: Soft and non-tender with normal pitched bowel sounds.  MUSCULOSKELETAL: There are no major deformities or cyanosis. NEUROLOGIC: No focal weakness or paresthesias are detected. SKIN: There are no ulcers or rashes noted. PSYCHIATRIC: The patient has a normal affect.  STUDIES:   I have reviewed her CT scan from 2019 which shows a calcified distal abdominal aorta and iliac arteries  ASSESSMENT and PLAN   I discussed that she is a candidate for anterior approach to the L5-S1 disc space.  I do have some concerns regarding the amount of calcium present on her distal aorta, however I do not think that is prohibitory.  In addition with her history of radiation and pelvic lymph node removal, there is the possibility that her scar tissue is too dense to make anterior approach safe.  If this is the case, she understands that we would abort this portion of the procedure.  I did discuss the other risks and benefits of the operation including the risk of arterial injury, venous injury, blood loss, and ureteral injury.  She understands this and wants to proceed.   Leia Alf, MD, FACS [facility] [phone] Pager [phone]

## 2018-09-11 NOTE — H&P (View-Only) (Signed)
Vascular and Vein Specialist of [place]  Patient name: [name] MRN: [id number] DOB: 14-Jul-1955 Sex: female   REQUESTING PROVIDER:    Dr. Rolena Infante    REASON FOR CONSULT:    Anterior approach for spinal surgery  HISTORY OF PRESENT ILLNESS:   [name] is a 63 y.o. female, who is referred today for evaluation of L5-S1 anterior approach.  The patient has significant left leg pain which is made worse by walking.  She is taking narcotics to alleviate her pain.  She did not get any relief from Neurontin.  She is a smoker.  She does have a history of cervical cancer and has undergone 5 cycles of radiation as well as vaginal hysterectomy and pelvic lymph node dissection.  PAST MEDICAL HISTORY    Past Medical History:  Diagnosis Date  . Arthritis    spinal stenosis  . Back disorder    "crooked spine"  . Bulging lumbar disc   . Cervical cancer ([place])   . Complication of anesthesia    ? hypotension 10/2016 at [facility] surgery   . Depression   . H/O degenerative disc disease   . History of radiation therapy 02/22/17-03/22/17   vaginal cuff 30 Gy in 5 fractions  . Hypertension   . Hypothyroidism    patient taken off of hypothyroid med in 11/2016   . Osteoporosis   . Thyroid disease      FAMILY HISTORY   Family History  Problem Relation Age of Onset  . Hypertension Mother   . Congenital heart disease Mother   . Diabetes Mother   . Thyroid disease Brother   . Other Daughter        bowel issues    SOCIAL HISTORY:   Social History   Socioeconomic History  . Marital status: Legally Separated    Spouse name: Not on file  . Number of children: 1  . Years of education: Not on file  . Highest education level: Not on file  Occupational History  . Not on file  Social Needs  . Financial resource strain: Not on file  . Food insecurity:    Worry: Not on file    Inability: Not on file  . Transportation needs:    Medical:  Not on file    Non-medical: Not on file  Tobacco Use  . Smoking status: Current Every Day Smoker    Packs/day: 1.00    Years: 44.00    Pack years: 44.00    Types: Cigarettes  . Smokeless tobacco: Never Used  . Tobacco comment: smokes 3 cig daily  Substance and Sexual Activity  . Alcohol use: No    Comment: 01-06-2016 Per pt rarely, 02-05-2016 per pt no but 46yrs ago    . Drug use: No    Comment: 02-05-2016 per pt no but about 40 yrs ago  . Sexual activity: Not Currently    Birth control/protection: Surgical    Comment: hyst  Lifestyle  . Physical activity:    Days per week: Not on file    Minutes per session: Not on file  . Stress: Not on file  Relationships  . Social connections:    Talks on phone: Not on file    Gets together: Not on file    Attends religious service: Not on file    Active member of club or organization: Not on file    Attends meetings of clubs or organizations: Not on file    Relationship status: Not  on file  . Intimate partner violence:    Fear of current or ex partner: Not on file    Emotionally abused: Not on file    Physically abused: Not on file    Forced sexual activity: Not on file  Other Topics Concern  . Not on file  Social History Narrative  . Not on file    ALLERGIES:    Allergies  Allergen Reactions  . Prednisone     Sick to stomach  . Zithromax [Azithromycin] Other (See Comments)    Blisters in mouth     CURRENT MEDICATIONS:    Current Outpatient Medications  Medication Sig Dispense Refill  . atorvastatin (LIPITOR) 40 MG tablet Take 40 mg by mouth at bedtime.     . calcium carbonate (OSCAL) 1500 (600 Ca) MG TABS tablet Take 600 mg of elemental calcium by mouth daily with breakfast.    . cholecalciferol (VITAMIN D) 1000 units tablet Take 1,000 Units by mouth daily.     . hydrOXYzine (ATARAX/VISTARIL) 25 MG tablet     . lisinopril (PRINIVIL,ZESTRIL) 40 MG tablet Take 40 mg by mouth daily with breakfast.     .  oxyCODONE-acetaminophen (PERCOCET/ROXICET) 5-325 MG tablet Take 1 tablet by mouth every 6 (six) hours as needed for severe pain.    Marland Kitchen tiZANidine (ZANAFLEX) 4 MG tablet tizanidine 4 mg tablet    . traZODone (DESYREL) 100 MG tablet Take 100-200 mg by mouth at bedtime as needed for sleep.     . vitamin B-12 (CYANOCOBALAMIN) 500 MCG tablet Take 1,000 mcg by mouth daily.    Marland Kitchen amLODipine (NORVASC) 5 MG tablet     . Aspirin-Salicylamide-Caffeine (BC HEADACHE POWDER PO) Take 1 Package by mouth 3 (three) times daily as needed.    . citalopram (CELEXA) 20 MG tablet Take one (1) tablet by mouth daily    . citalopram (CELEXA) 40 MG tablet     . denosumab (PROLIA) 60 MG/ML SOSY injection Inject 60 mg into the skin every 6 (six) months.    Marland Kitchen HYDROcodone-acetaminophen (NORCO/VICODIN) 5-325 MG tablet Take 1 tablet by mouth every 6 (six) hours as needed for moderate pain.    . mirtazapine (REMERON) 30 MG tablet     . PROAIR HFA 108 (90 Base) MCG/ACT inhaler      No current facility-administered medications for this visit.     REVIEW OF SYSTEMS:   [X]  denotes positive finding, [ ]  denotes negative finding Cardiac  Comments:  Chest pain or chest pressure:    Shortness of breath upon exertion:    Short of breath when lying flat:    Irregular heart rhythm:        Vascular    Pain in calf, thigh, or hip brought on by ambulation:    Pain in feet at night that wakes you up from your sleep:     Blood clot in your veins:    Leg swelling:         Pulmonary    Oxygen at home:    Productive cough:     Wheezing:         Neurologic    Sudden weakness in arms or legs:     Sudden numbness in arms or legs:     Sudden onset of difficulty speaking or slurred speech:    Temporary loss of vision in one eye:     Problems with dizziness:         Gastrointestinal    Blood in stool:  Vomited blood:         Genitourinary    Burning when urinating:     Blood in urine:        Psychiatric    Major  depression:         Hematologic    Bleeding problems:    Problems with blood clotting too easily:        Skin    Rashes or ulcers:        Constitutional    Fever or chills:     PHYSICAL EXAM:   Vitals:   09/11/18 0846  BP: 129/81  Pulse: 79  Resp: 20  Temp: 98.3 F (36.8 C)  SpO2: 93%  Weight: 65.1 kg  Height: 5\' 4"  (1.626 m)    GENERAL: The patient is a well-nourished female, in no acute distress. The vital signs are documented above. CARDIAC: There is a regular rate and rhythm.  VASCULAR: Palpable posterior tibial and dorsalis pedis pulse on the left PULMONARY: Nonlabored respirations ABDOMEN: Soft and non-tender with normal pitched bowel sounds.  MUSCULOSKELETAL: There are no major deformities or cyanosis. NEUROLOGIC: No focal weakness or paresthesias are detected. SKIN: There are no ulcers or rashes noted. PSYCHIATRIC: The patient has a normal affect.  STUDIES:   I have reviewed her CT scan from 2019 which shows a calcified distal abdominal aorta and iliac arteries  ASSESSMENT and PLAN   I discussed that she is a candidate for anterior approach to the L5-S1 disc space.  I do have some concerns regarding the amount of calcium present on her distal aorta, however I do not think that is prohibitory.  In addition with her history of radiation and pelvic lymph node removal, there is the possibility that her scar tissue is too dense to make anterior approach safe.  If this is the case, she understands that we would abort this portion of the procedure.  I did discuss the other risks and benefits of the operation including the risk of arterial injury, venous injury, blood loss, and ureteral injury.  She understands this and wants to proceed.   Leia Alf, MD, FACS [facility] [phone] Pager [phone]

## 2018-09-13 DIAGNOSIS — M545 Low back pain: Secondary | ICD-10-CM | POA: Diagnosis not present

## 2018-09-13 DIAGNOSIS — G4700 Insomnia, unspecified: Secondary | ICD-10-CM | POA: Diagnosis not present

## 2018-09-13 DIAGNOSIS — I10 Essential (primary) hypertension: Secondary | ICD-10-CM | POA: Diagnosis not present

## 2018-09-13 DIAGNOSIS — Z79891 Long term (current) use of opiate analgesic: Secondary | ICD-10-CM | POA: Diagnosis not present

## 2018-09-13 DIAGNOSIS — F419 Anxiety disorder, unspecified: Secondary | ICD-10-CM | POA: Diagnosis not present

## 2018-09-13 DIAGNOSIS — E785 Hyperlipidemia, unspecified: Secondary | ICD-10-CM | POA: Diagnosis not present

## 2018-09-13 DIAGNOSIS — M5126 Other intervertebral disc displacement, lumbar region: Secondary | ICD-10-CM | POA: Diagnosis not present

## 2018-09-13 DIAGNOSIS — Z79899 Other long term (current) drug therapy: Secondary | ICD-10-CM | POA: Diagnosis not present

## 2018-09-13 DIAGNOSIS — E860 Dehydration: Secondary | ICD-10-CM | POA: Diagnosis not present

## 2018-09-18 ENCOUNTER — Ambulatory Visit: Payer: Self-pay | Admitting: Orthopedic Surgery

## 2018-09-18 DIAGNOSIS — M5417 Radiculopathy, lumbosacral region: Secondary | ICD-10-CM | POA: Diagnosis not present

## 2018-09-18 DIAGNOSIS — Z01818 Encounter for other preprocedural examination: Secondary | ICD-10-CM | POA: Diagnosis not present

## 2018-09-18 DIAGNOSIS — M5116 Intervertebral disc disorders with radiculopathy, lumbar region: Secondary | ICD-10-CM | POA: Diagnosis not present

## 2018-09-18 DIAGNOSIS — M5136 Other intervertebral disc degeneration, lumbar region: Secondary | ICD-10-CM | POA: Diagnosis not present

## 2018-09-18 NOTE — H&P (Signed)
Subjective:   The patient is here today for a pre-operative History and Physical. They are scheduled for ____ ALIF L5-S1, discectomy left L4-5 on 09-27-18 with Dr. Rolena Infante at [facility]. Initial surgery date was 04/25/2018, but surgery was postponed due to COVID-19.  Patient continues to complain of low back pain and radicualr bilateral leg pain. MRI from 04/18/2018 shows left foraminal stenosis caused by herniated nucleus propulses at L4/5 and DDD at L5/S1. Patient previously attempted physical therapy, narcotic medications, and multiple epidural steroid injections without significant improvement of quality of life.  Patient currently gets narcotic medications from her PCP, she would like her PCP to manage her post-operative pain medications. She has an appointment scheduled with PT for LSO brace fitting. I provided her with a new clearance for which she will take to her PCP. Additionally, she is aware that [place] will be contacting her to schedule pre-operative testing.  Patient Active Problem List   Diagnosis Date Noted  . Degeneration of lumbar intervertebral disc 04/19/2017  . Major depressive disorder, single episode, unspecified 03/02/2017  . 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 Hyperthyroidism 11/19/2016  . S/P vaginal hysterectomy with anterior, and posterior repair, right salpingo-oophorectomy and left salpingectomy 11/09/2016  . Status post vaginal hysterectomy 11/09/2016  . Moderate episode of recurrent major depressive disorder ([place]) 01/06/2016   Past Medical History:  Diagnosis Date  . Arthritis    spinal stenosis  . Back disorder    "crooked spine"  . Bulging lumbar disc   . Cervical cancer ([place])   . Complication of anesthesia    ? hypotension 63/2018 at [facility] surgery   . Depression   . H/O degenerative disc disease   . History of radiation therapy 02/22/17-03/22/17   vaginal cuff 30 Gy in 5 fractions  . Hypertension   . Hypothyroidism    patient taken off of hypothyroid med in 11/2016   . Osteoporosis   . Thyroid disease     Past Surgical History:  Procedure Laterality Date  . ANTERIOR AND POSTERIOR REPAIR N/A 11/09/2016   Procedure: ANTERIOR (CYSTOCELE) AND POSTERIOR REPAIR (RECTOCELE);  Surgeon: Jonnie Kind, MD;  Location: [facility];  Service: Gynecology;  Laterality: N/A;  . BILATERAL SALPINGECTOMY Left 11/09/2016   Procedure: LEFT SALPINGECTOMY;  Surgeon: Jonnie Kind, MD;  Location: [facility];  Service: Gynecology;  Laterality: Left;  . CATARACT EXTRACTION W/PHACO Right 07/05/2016   Procedure: CATARACT EXTRACTION PHACO AND INTRAOCULAR LENS PLACEMENT (IOC);  Surgeon: Tonny Branch, MD;  Location: [facility];  Service: Ophthalmology;  Laterality: Right;  CDE: 15.41  . CATARACT EXTRACTION W/PHACO Left 07/19/2016   Procedure: CATARACT EXTRACTION PHACO AND INTRAOCULAR LENS PLACEMENT LEFT EYE CDE= 12.65;  Surgeon: Tonny Branch, MD;  Location: [facility];  Service: Ophthalmology;  Laterality: Left;  left  . ROBOTIC PELVIC AND PARA-AORTIC LYMPH NODE DISSECTION N/A 01/11/2017   Procedure: XI ROBOTIC BILATERAL PELVIC LYMPH NODE DISSECTION;  Surgeon: [name], MD;  Location: [facility];  Service: Gynecology;  Laterality: N/A;  . SALPINGOOPHORECTOMY Right 11/09/2016   Procedure: RIGHT SALPINGO OOPHORECTOMY;  Surgeon: Jonnie Kind, MD;  Location: [facility];  Service: Gynecology;  Laterality: Right;  . TUBAL LIGATION    . VAGINAL HYSTERECTOMY N/A 11/09/2016   Procedure: HYSTERECTOMY VAGINAL;  Surgeon: Jonnie Kind, MD;  Location: [facility];  Service: Gynecology;  Laterality: N/A;    Current Outpatient Medications  Medication Sig Dispense Refill Last Dose  . amLODipine (NORVASC) 5 MG tablet      .  Aspirin-Salicylamide-Caffeine (BC HEADACHE POWDER PO) Take 1 Package by mouth 3 (three) times daily as needed.   Not Taking  . atorvastatin (LIPITOR) 40 MG tablet Take 40 mg by mouth at bedtime.    Taking  . calcium carbonate (OSCAL) 1500 (600 Ca) MG TABS  tablet Take 600 mg of elemental calcium by mouth daily with breakfast.   Taking  . cholecalciferol (VITAMIN D) 1000 units tablet Take 1,000 Units by mouth daily.    Taking  . citalopram (CELEXA) 20 MG tablet Take one (1) tablet by mouth daily   Taking  . citalopram (CELEXA) 40 MG tablet      . denosumab (PROLIA) 60 MG/ML SOSY injection Inject 60 mg into the skin every 6 (six) months.   Not Taking  . HYDROcodone-acetaminophen (NORCO/VICODIN) 5-325 MG tablet Take 1 tablet by mouth every 6 (six) hours as needed for moderate pain.   Not Taking  . hydrOXYzine (ATARAX/VISTARIL) 25 MG tablet    Taking  . lisinopril (PRINIVIL,ZESTRIL) 40 MG tablet Take 40 mg by mouth daily with breakfast.    Taking  . mirtazapine (REMERON) 30 MG tablet      . oxyCODONE-acetaminophen (PERCOCET/ROXICET) 5-325 MG tablet Take 1 tablet by mouth every 6 (six) hours as needed for severe pain.   Taking  . PROAIR HFA 108 (90 Base) MCG/ACT inhaler    Not Taking  . tiZANidine (ZANAFLEX) 4 MG tablet tizanidine 4 mg tablet   Taking  . traZODone (DESYREL) 100 MG tablet Take 100-200 mg by mouth at bedtime as needed for sleep.    Taking  . vitamin B-12 (CYANOCOBALAMIN) 500 MCG tablet Take 1,000 mcg by mouth daily.   Taking   No current facility-administered medications for this visit.    Allergies  Allergen Reactions  . Prednisone     Sick to stomach  . Zithromax [Azithromycin] Other (See Comments)    Blisters in mouth     Social History   Tobacco Use  . Smoking status: Current Every Day Smoker    Packs/day: 1.00    Years: 44.00    Pack years: 44.00    Types: Cigarettes  . Smokeless tobacco: Never Used  . Tobacco comment: smokes 3 cig daily  Substance Use Topics  . Alcohol use: No    Comment: 01-06-2016 Per pt rarely, 02-05-2016 per pt no but 48yrs ago      Family History  Problem Relation Age of Onset  . Hypertension Mother   . Congenital heart disease Mother   . Diabetes Mother   . Thyroid disease Brother   .  Other Daughter        bowel issues    Review of Systems As stated in HPI  Objective:   General: AAOX3, well developed and well nourished, NAD  Ambulation Normal uses no assitive devices  Heart: RRR, no rubs, murmers, or gallops.  Lungs: + wheeze b/l upper lobes on inspiration (smoker).  Abdomen: Normal bowel soundsX4, soft, non-tender, no hepatosplenomegaly.  ROM: - Knee: flexion and extension normal and pain free bilaterally. - Ankle: Dorsiflexion, plantarflexion, inversion, eversion normal and pain free.  Dermatomes: Lower extremity sensation to light touch abnormal ositive dysesthesias in the right L5 and left L4 dermatomes.  Myotomes: - Hip Flexion: Left 5/5, Right 5/5 - Knee Extension: Left 5/5, Right 5/5 - Knee Flexion: Left 5/5, Right 5/5 - Ankle Dorsiflextion: Left 5/5, Right 5/5 - Ankle Plantarflexion: Left 5/5, Right 5/5  Reflexes: - Patella: Left2+, Right 2+ - Achilles: Left2+, Right  2+ - Babinski: Left Ngative, Right Negative - Clonus: Negative  Special Tests: - Straight Leg Raise: Left Negative, Right Negative  PV: Extremities warm and well profused. Posterior and dorsalis pedis pulse 2+ bilaterally, No pitting Edema, discoloration, calf tenderness, or palpable cords. Homan's negative bilaterally.   X-Ray impression: Updated lumbar spine X-rays at todays visit. No signs of fracture. No significant curvature or slip. There is severe DDD at L5/S1  Lumbar MRI: completed on 04/18/18 was reviewed with the patient. I have also reviewed the radiology report. Moderate left lateral recess foraminal disc herniation causing compression of the exiting L4 nerve root. No spinal canal stenosis. Severe disc osteophyte complex L5-S1 causing foraminal stenosis on the right side greater than the left. Significant collapse of the disc space is noted.  CT scan of the lumbar spine 03/28/18: Chronic disc degeneration with right foraminal stenosis secondary to hard disc osteophyte.  Compression of the exiting L5 nerve root right worse than the left. Newly seen left foraminal disc herniation L4-5 compressing the left L4 nerve root. Chronic degenerative disc disease L2-3. Mild degenerative changes L3-4 and L4-5.   Assessment:   [name] is a very pleasant 63 year old man is been under my care for some time now. She has attempted self directed physical therapy, and she has had 3 epidural injections. While the injections do provide temporary relief of overall quality-of-life is continued to deteriorate.  Her clinical exam is consistent with discogenic back pain with right L5 radicular pain, as well as severe new onset of left L4 radicular pain. She has positive nerve root tension signs and neurological deficits (sensory changes).  Plan:    At this point time the patient has indicated she would like to move forward with surgical intervention. To address the degenerative disc disease I recommended an anterior lumbar interbody fusion. This would will restore her intervertebral space and indirectly address the foraminal stenosis on the right side which will address her right L5 radicular pain. I would then do a posterior foraminotomy and discectomy at L4-5 on the left side to address the L4 neural compression. I have gone over the surgery in great detail and she is expressing understanding of the procedure.  I have also gone over the risks and all of her questions were addressed. Risks of ALIF: Risks and benefits of surgery were discussed with the patient. These include: Infection, bleeding, death, stroke, paralysis, ongoing or worse pain, need for additional surgery, nonunion, leak of spinal fluid, adjacent segment degeneration requiring additional fusion surgery, need for posterior decompression and/or fusion. Bleeding from major vessels, and blood clots (deep venous thrombosis)requiring additional treatment. Due to the abdominal contents requiring further intervention, loss in bowel and  bladder control. Additional risk for female patients: Retrograde ejaculation, and therefore infertility.  Risks of the lumbar discectomy: Risks and benefits of surgery were discussed with the patient. These include: Infection, bleeding, death, stroke, paralysis, ongoing or worse pain, need for additional surgery, leak of spinal fluid, adjacent segment degeneration requiring additional surgery, post-operative hematoma formation that can result in neurological compromise and the need for urgent/emergent re-operation. Loss in bowel and bladder control. Injury to major vessels that could result in the need for urgent abdominal surgery to stop bleeding. Risk of deep venous thrombosis (DVT) and the need for additional treatment. Recurrent disc herniation resulting in the need for revision surgery, which could include fusion surgery (utilizing instrumentation such as pedicle screws and intervertebral cages). Additional risk: If instrumentation is used there is a risk of  migration, or breakage of that hardware that could require additional surgery.  We have also discussed the goals of surgery to include: Goals of surgery: Reduction in pain, and improvement in quality of life.  We have also discussed the post-operative recovery period to include: bathing/showering restrictions, wound healing, activity (and driving) restrictions, medications/pain management. Patient has indicated that both Dr. Rolena Infante, her PCP, and heerself have agreed that he will continue to manage the patient's pain postoperatively.  We have also discussed post-operative redflags to include: signs and symptoms of postoperative infection, DVT/PE.  We have also discussed the need for smoking cessation and she is indicated that she will stop.  We will need to obtain a updated surgical clearance from her PCP. I provided her with a new surgical clearance form which she will take to her PCP today and will be faxed in. Additionally, she is scheduled  to go downstairs for physical therapy today and be fitted for LSO brace.  Follow-up: 2 weeks Postoperatively.  Note dictated by Cleta Alberts PA-C, patient seen in conjunction with Dr. Rolena Infante.

## 2018-09-19 ENCOUNTER — Other Ambulatory Visit (HOSPITAL_COMMUNITY): Payer: PPO

## 2018-09-19 ENCOUNTER — Other Ambulatory Visit (HOSPITAL_COMMUNITY): Payer: Self-pay | Admitting: Family Medicine

## 2018-09-19 DIAGNOSIS — Z1231 Encounter for screening mammogram for malignant neoplasm of breast: Secondary | ICD-10-CM

## 2018-09-20 ENCOUNTER — Inpatient Hospital Stay: Payer: Medicare Other | Attending: Gynecologic Oncology | Admitting: Gynecologic Oncology

## 2018-09-20 DIAGNOSIS — Z01818 Encounter for other preprocedural examination: Secondary | ICD-10-CM | POA: Diagnosis not present

## 2018-09-23 ENCOUNTER — Other Ambulatory Visit (HOSPITAL_COMMUNITY)
Admission: RE | Admit: 2018-09-23 | Discharge: 2018-09-23 | Disposition: A | Discharge status: Home / Self Care | Payer: PPO | Source: Ambulatory Visit | Attending: Orthopedic Surgery | Admitting: Orthopedic Surgery

## 2018-09-23 DIAGNOSIS — Z1159 Encounter for screening for other viral diseases: Secondary | ICD-10-CM | POA: Diagnosis not present

## 2018-09-25 ENCOUNTER — Ambulatory Visit (HOSPITAL_COMMUNITY)
Admission: RE | Admit: 2018-09-25 | Discharge: 2018-09-25 | Disposition: A | Discharge status: Home / Self Care | Payer: PPO | Source: Ambulatory Visit | Attending: Orthopedic Surgery | Admitting: Orthopedic Surgery

## 2018-09-25 ENCOUNTER — Other Ambulatory Visit: Payer: Self-pay

## 2018-09-25 ENCOUNTER — Encounter (HOSPITAL_COMMUNITY)
Admission: RE | Admit: 2018-09-25 | Discharge: 2018-09-25 | Disposition: A | Discharge status: Home / Self Care | Payer: PPO | Source: Ambulatory Visit | Attending: Orthopedic Surgery | Admitting: Orthopedic Surgery

## 2018-09-25 ENCOUNTER — Encounter (HOSPITAL_COMMUNITY): Payer: Self-pay

## 2018-09-25 DIAGNOSIS — E039 Hypothyroidism, unspecified: Secondary | ICD-10-CM | POA: Diagnosis not present

## 2018-09-25 DIAGNOSIS — Z90710 Acquired absence of both cervix and uterus: Secondary | ICD-10-CM | POA: Diagnosis not present

## 2018-09-25 DIAGNOSIS — I10 Essential (primary) hypertension: Secondary | ICD-10-CM | POA: Diagnosis not present

## 2018-09-25 DIAGNOSIS — M5417 Radiculopathy, lumbosacral region: Secondary | ICD-10-CM | POA: Diagnosis not present

## 2018-09-25 DIAGNOSIS — Z923 Personal history of irradiation: Secondary | ICD-10-CM | POA: Diagnosis not present

## 2018-09-25 DIAGNOSIS — Z881 Allergy status to other antibiotic agents status: Secondary | ICD-10-CM | POA: Diagnosis not present

## 2018-09-25 DIAGNOSIS — G8918 Other acute postprocedural pain: Secondary | ICD-10-CM | POA: Diagnosis not present

## 2018-09-25 DIAGNOSIS — M810 Age-related osteoporosis without current pathological fracture: Secondary | ICD-10-CM | POA: Diagnosis not present

## 2018-09-25 DIAGNOSIS — M5416 Radiculopathy, lumbar region: Secondary | ICD-10-CM | POA: Diagnosis not present

## 2018-09-25 DIAGNOSIS — M5116 Intervertebral disc disorders with radiculopathy, lumbar region: Secondary | ICD-10-CM | POA: Diagnosis not present

## 2018-09-25 DIAGNOSIS — Z01818 Encounter for other preprocedural examination: Secondary | ICD-10-CM

## 2018-09-25 DIAGNOSIS — Z8349 Family history of other endocrine, nutritional and metabolic diseases: Secondary | ICD-10-CM | POA: Diagnosis not present

## 2018-09-25 DIAGNOSIS — M5137 Other intervertebral disc degeneration, lumbosacral region: Secondary | ICD-10-CM | POA: Diagnosis not present

## 2018-09-25 DIAGNOSIS — M5117 Intervertebral disc disorders with radiculopathy, lumbosacral region: Secondary | ICD-10-CM | POA: Diagnosis not present

## 2018-09-25 DIAGNOSIS — M4807 Spinal stenosis, lumbosacral region: Secondary | ICD-10-CM | POA: Diagnosis not present

## 2018-09-25 DIAGNOSIS — Z8541 Personal history of malignant neoplasm of cervix uteri: Secondary | ICD-10-CM | POA: Diagnosis not present

## 2018-09-25 DIAGNOSIS — M5126 Other intervertebral disc displacement, lumbar region: Secondary | ICD-10-CM | POA: Diagnosis present

## 2018-09-25 DIAGNOSIS — C530 Malignant neoplasm of endocervix: Secondary | ICD-10-CM | POA: Diagnosis not present

## 2018-09-25 DIAGNOSIS — Z79899 Other long term (current) drug therapy: Secondary | ICD-10-CM | POA: Diagnosis not present

## 2018-09-25 DIAGNOSIS — M549 Dorsalgia, unspecified: Secondary | ICD-10-CM | POA: Diagnosis not present

## 2018-09-25 DIAGNOSIS — F17210 Nicotine dependence, cigarettes, uncomplicated: Secondary | ICD-10-CM | POA: Diagnosis not present

## 2018-09-25 DIAGNOSIS — M48061 Spinal stenosis, lumbar region without neurogenic claudication: Secondary | ICD-10-CM | POA: Diagnosis not present

## 2018-09-25 DIAGNOSIS — M4327 Fusion of spine, lumbosacral region: Secondary | ICD-10-CM | POA: Diagnosis not present

## 2018-09-25 DIAGNOSIS — Z888 Allergy status to other drugs, medicaments and biological substances status: Secondary | ICD-10-CM | POA: Diagnosis not present

## 2018-09-25 HISTORY — DX: Anxiety disorder, unspecified: F41.9

## 2018-09-25 LAB — BASIC METABOLIC PANEL
Anion gap: 11 (ref 5–15)
BUN: 13 mg/dL (ref 8–23)
CO2: 23 mmol/L (ref 22–32)
Calcium: 9.2 mg/dL (ref 8.9–10.3)
Chloride: 105 mmol/L (ref 98–111)
Creatinine, Ser: 0.89 mg/dL (ref 0.44–1.00)
GFR calc Af Amer: >60 mL/min (ref >60)
GFR calc non Af Amer: >60 mL/min (ref >60)
Glucose, Bld: 87 mg/dL (ref 70–99)
Potassium: 3.9 mmol/L (ref 3.5–5.1)
Sodium: 139 mmol/L (ref 135–145)

## 2018-09-25 LAB — URINALYSIS, ROUTINE W REFLEX MICROSCOPIC
Bilirubin Urine: NEGATIVE
Glucose, UA: NEGATIVE mg/dL
Hgb urine dipstick: NEGATIVE
Ketones, ur: NEGATIVE mg/dL
Nitrite: NEGATIVE
Protein, ur: NEGATIVE mg/dL
Specific Gravity, Urine: 1.017 (ref 1.005–1.030)
pH: 5 (ref 5.0–8.0)

## 2018-09-25 LAB — TYPE AND SCREEN
ABO/RH(D): A POS
Antibody Screen: NEGATIVE

## 2018-09-25 LAB — CBC
HCT: 45.7 % (ref 36.0–46.0)
Hemoglobin: 15.1 g/dL — ABNORMAL HIGH (ref 12.0–15.0)
MCH: 30 pg (ref 26.0–34.0)
MCHC: 33 g/dL (ref 30.0–36.0)
MCV: 90.7 fL (ref 80.0–100.0)
Platelets: 265 10*3/uL (ref 150–400)
RBC: 5.04 MIL/uL (ref 3.87–5.11)
RDW: 12.6 % (ref 11.5–15.5)
WBC: 9.2 10*3/uL (ref 4.0–10.5)
nRBC: 0 % (ref 0.0–0.2)

## 2018-09-25 LAB — ABO/RH: ABO/RH(D): A POS

## 2018-09-25 LAB — PROTIME-INR
INR: 0.9 (ref 0.8–1.2)
Prothrombin Time: 12.3 seconds (ref 11.4–15.2)

## 2018-09-25 LAB — NOVEL CORONAVIRUS, NAA (HOSP ORDER, SEND-OUT TO REF LAB; TAT 18-24 HRS): SARS-CoV-2, NAA: NOT DETECTED

## 2018-09-25 LAB — APTT: aPTT: 34 seconds (ref 24–36)

## 2018-09-25 LAB — SURGICAL PCR SCREEN
MRSA, PCR: NEGATIVE
Staphylococcus aureus: NEGATIVE

## 2018-09-25 NOTE — Pre-Procedure Instructions (Signed)
[name]  09/25/2018      [facility] [place] [address] Phone: [phone] Fax: [phone]    Your procedure is scheduled on June 17  Report to [facility] Entrance A at 6:30 A.M.  Call this number if you have problems the morning of surgery:  [phone]   Remember:  Do not eat or drink after midnight.      Take these medicines the morning of surgery with A SIP OF WATER :              Alprazolam (xanax) if needed             Amlodipine (norvasc)             Citalopram (celexa)             Oxycodone if needed             Tizanidine (zanaflex)          7 days prior to surgery STOP taking any Aspirin (unless otherwise instructed by your surgeon), Aleve, Naproxen, Ibuprofen, Motrin, Advil, Goody's, BC's, all herbal medications, fish oil, and all vitamins.                  Do not wear jewelry, make-up or nail polish.  Do not wear lotions, powders, or perfumes, or deodorant.  Do not shave 48 hours prior to surgery.  Men may shave face and neck.  Do not bring valuables to the hospital.  [facility] is not responsible for any belongings or valuables.  Contacts, dentures or bridgework may not be worn into surgery.  Leave your suitcase in the car.  After surgery it may be brought to your room.  For patients admitted to the hospital, discharge time will be determined by your treatment team.  Patients discharged the day of surgery will not be allowed to drive home.    Special instructions:   [facility]- Preparing For Surgery  Before surgery, you can play an important role. Because skin is not sterile, your skin needs to be as free of germs as possible. You can reduce the number of germs on your skin by washing with CHG (chlorahexidine gluconate) Soap before surgery.  CHG is an antiseptic cleaner which kills germs and bonds with the skin to continue killing germs even after washing.    Oral Hygiene is  also important to reduce your risk of infection.  Remember - BRUSH YOUR TEETH THE MORNING OF SURGERY WITH YOUR REGULAR TOOTHPASTE  Please do not use if you have an allergy to CHG or antibacterial soaps. If your skin becomes reddened/irritated stop using the CHG.  Do not shave (including legs and underarms) for at least 48 hours prior to first CHG shower. It is OK to shave your face.  Please follow these instructions carefully.   1. Shower the NIGHT BEFORE SURGERY and the MORNING OF SURGERY with CHG.   2. If you chose to wash your hair, wash your hair first as usual with your normal shampoo.  3. After you shampoo, rinse your hair and body thoroughly to remove the shampoo.  4. Use CHG as you would any other liquid soap. You can apply CHG directly to the skin and wash gently with a scrungie or a clean washcloth.   5. Apply the CHG Soap to your body ONLY FROM THE NECK DOWN.  Do not use on open wounds or  open sores. Avoid contact with your eyes, ears, mouth and genitals (private parts). Wash Face and genitals (private parts)  with your normal soap.  6. Wash thoroughly, paying special attention to the area where your surgery will be performed.  7. Thoroughly rinse your body with warm water from the neck down.  8. DO NOT shower/wash with your normal soap after using and rinsing off the CHG Soap.  9. Pat yourself dry with a CLEAN TOWEL.  10. Wear CLEAN PAJAMAS to bed the night before surgery, wear comfortable clothes the morning of surgery  11. Place CLEAN SHEETS on your bed the night of your first shower and DO NOT SLEEP WITH PETS.    Day of Surgery:  Do not apply any deodorants/lotions.  Please wear clean clothes to the hospital/surgery center.   Remember to brush your teeth WITH YOUR REGULAR TOOTHPASTE.    Please read over the following fact sheets that you were given. Coughing and Deep Breathing and Surgical Site Infection Prevention

## 2018-09-25 NOTE — Progress Notes (Signed)
PCP - Dr. Maudie Mercury, Ferdinand Lango PA, @ [facility] Cardiologist - na  Chest x-ray - today EKG - today Stress Test - na ECHO - na Cardiac Cath - na  Sleep Study - na CPAP -   Fasting Blood Sugar - na Checks Blood Sugar _____ times a day  Blood Thinner Instructions: Aspirin Instructions:na  Anesthesia review:   Patient denies shortness of breath, fever, cough and chest pain at PAT appointment   Patient verbalized understanding of instructions that were given to them at the PAT appointment. Patient was also instructed that they will need to review over the PAT instructions again at home before surgery.

## 2018-09-26 MED ORDER — TRANEXAMIC ACID-NACL 1000-0.7 MG/100ML-% IV SOLN
1000.0000 mg | INTRAVENOUS | Status: AC
  Administered 2018-09-27: 1000 mg via INTRAVENOUS
  Filled 2018-09-26: qty 100

## 2018-09-26 NOTE — Anesthesia Preprocedure Evaluation (Addendum)
Anesthesia Evaluation  Patient identified by MRN, date of birth, ID band Patient awake    Reviewed: Allergy & Precautions, NPO status , Patient's Chart, lab work & pertinent test results  Airway Mallampati: II  TM Distance: >3 FB Neck ROM: Full    Dental  (+) Teeth Intact, Dental Advisory Given   Pulmonary Current Smoker,    breath sounds clear to auscultation       Cardiovascular hypertension,  Rhythm:Regular Rate:Normal     Neuro/Psych    GI/Hepatic   Endo/Other    Renal/GU      Musculoskeletal   Abdominal   Peds  Hematology   Anesthesia Other Findings   Reproductive/Obstetrics                            Anesthesia Physical Anesthesia Plan  ASA: III  Anesthesia Plan: General   Post-op Pain Management:    Induction: Intravenous  PONV Risk Score and Plan: Ondansetron and Dexamethasone  Airway Management Planned: Oral ETT  Additional Equipment:   Intra-op Plan:   Post-operative Plan: Extubation in OR  Informed Consent: I have reviewed the patients History and Physical, chart, labs and discussed the procedure including the risks, benefits and alternatives for the proposed anesthesia with the patient or authorized representative who has indicated his/her understanding and acceptance.     Dental advisory given  Plan Discussed with: CRNA and Anesthesiologist  Anesthesia Plan Comments: (PCP clearance 09/20/18, copy on pt chart. )       Anesthesia Quick Evaluation

## 2018-09-27 ENCOUNTER — Inpatient Hospital Stay (HOSPITAL_COMMUNITY): Payer: PPO | Admitting: Certified Registered Nurse Anesthetist

## 2018-09-27 ENCOUNTER — Inpatient Hospital Stay (HOSPITAL_COMMUNITY): Payer: PPO

## 2018-09-27 ENCOUNTER — Other Ambulatory Visit: Payer: Self-pay

## 2018-09-27 ENCOUNTER — Inpatient Hospital Stay (HOSPITAL_COMMUNITY): Payer: PPO | Admitting: Physician Assistant

## 2018-09-27 ENCOUNTER — Encounter (HOSPITAL_COMMUNITY): Payer: Self-pay

## 2018-09-27 ENCOUNTER — Inpatient Hospital Stay (HOSPITAL_COMMUNITY)
Admission: RE | Disposition: A | Discharge status: Home / Self Care | Payer: Self-pay | Source: Home / Self Care | Attending: Orthopedic Surgery

## 2018-09-27 ENCOUNTER — Inpatient Hospital Stay (HOSPITAL_COMMUNITY)
Admission: RE | Admit: 2018-09-27 | Discharge: 2018-09-28 | DRG: 460 | Disposition: A | Discharge status: Home / Self Care | Payer: PPO | Attending: Orthopedic Surgery | Admitting: Orthopedic Surgery

## 2018-09-27 DIAGNOSIS — M4327 Fusion of spine, lumbosacral region: Secondary | ICD-10-CM | POA: Diagnosis not present

## 2018-09-27 DIAGNOSIS — Z419 Encounter for procedure for purposes other than remedying health state, unspecified: Secondary | ICD-10-CM

## 2018-09-27 DIAGNOSIS — Z90710 Acquired absence of both cervix and uterus: Secondary | ICD-10-CM | POA: Diagnosis not present

## 2018-09-27 DIAGNOSIS — M5137 Other intervertebral disc degeneration, lumbosacral region: Secondary | ICD-10-CM | POA: Diagnosis present

## 2018-09-27 DIAGNOSIS — F17210 Nicotine dependence, cigarettes, uncomplicated: Secondary | ICD-10-CM | POA: Diagnosis present

## 2018-09-27 DIAGNOSIS — I10 Essential (primary) hypertension: Secondary | ICD-10-CM | POA: Diagnosis present

## 2018-09-27 DIAGNOSIS — E039 Hypothyroidism, unspecified: Secondary | ICD-10-CM | POA: Diagnosis present

## 2018-09-27 DIAGNOSIS — Z888 Allergy status to other drugs, medicaments and biological substances status: Secondary | ICD-10-CM | POA: Diagnosis not present

## 2018-09-27 DIAGNOSIS — M4807 Spinal stenosis, lumbosacral region: Secondary | ICD-10-CM | POA: Diagnosis present

## 2018-09-27 DIAGNOSIS — Z881 Allergy status to other antibiotic agents status: Secondary | ICD-10-CM

## 2018-09-27 DIAGNOSIS — Z8349 Family history of other endocrine, nutritional and metabolic diseases: Secondary | ICD-10-CM | POA: Diagnosis not present

## 2018-09-27 DIAGNOSIS — M5116 Intervertebral disc disorders with radiculopathy, lumbar region: Principal | ICD-10-CM | POA: Diagnosis present

## 2018-09-27 DIAGNOSIS — M810 Age-related osteoporosis without current pathological fracture: Secondary | ICD-10-CM | POA: Diagnosis present

## 2018-09-27 DIAGNOSIS — Z79899 Other long term (current) drug therapy: Secondary | ICD-10-CM | POA: Diagnosis not present

## 2018-09-27 DIAGNOSIS — Z923 Personal history of irradiation: Secondary | ICD-10-CM

## 2018-09-27 DIAGNOSIS — Z981 Arthrodesis status: Secondary | ICD-10-CM

## 2018-09-27 DIAGNOSIS — Z8541 Personal history of malignant neoplasm of cervix uteri: Secondary | ICD-10-CM | POA: Diagnosis not present

## 2018-09-27 DIAGNOSIS — M549 Dorsalgia, unspecified: Secondary | ICD-10-CM | POA: Diagnosis not present

## 2018-09-27 DIAGNOSIS — M5417 Radiculopathy, lumbosacral region: Secondary | ICD-10-CM | POA: Diagnosis not present

## 2018-09-27 DIAGNOSIS — M5126 Other intervertebral disc displacement, lumbar region: Secondary | ICD-10-CM | POA: Diagnosis present

## 2018-09-27 HISTORY — PX: LUMBAR LAMINECTOMY/DECOMPRESSION MICRODISCECTOMY: SHX5026

## 2018-09-27 HISTORY — PX: ABDOMINAL EXPOSURE: SHX5708

## 2018-09-27 HISTORY — PX: ANTERIOR LUMBAR FUSION: SHX1170

## 2018-09-27 SURGERY — ANTERIOR LUMBAR FUSION 1 LEVEL
Anesthesia: General | Site: Spine Lumbar

## 2018-09-27 MED ORDER — ONDANSETRON HCL 4 MG PO TABS: 4.0000 mg | ORAL_TABLET | Freq: Four times a day (QID) | ORAL | Status: DC | PRN

## 2018-09-27 MED ORDER — ATORVASTATIN CALCIUM 40 MG PO TABS
40.0000 mg | ORAL_TABLET | Freq: Every day | ORAL | Status: DC
  Administered 2018-09-27: 21:00:00 40 mg via ORAL
  Filled 2018-09-27: qty 1

## 2018-09-27 MED ORDER — ALPRAZOLAM 0.5 MG PO TABS
0.5000 mg | ORAL_TABLET | Freq: Two times a day (BID) | ORAL | Status: DC | PRN
  Administered 2018-09-27: 0.5 mg via ORAL
  Filled 2018-09-27: qty 1

## 2018-09-27 MED ORDER — PHENOL 1.4 % MT LIQD: 1.0000 | OROMUCOSAL | Status: DC | PRN

## 2018-09-27 MED ORDER — CEFAZOLIN SODIUM-DEXTROSE 2-4 GM/100ML-% IV SOLN
INTRAVENOUS | Status: AC
  Filled 2018-09-27: qty 100

## 2018-09-27 MED ORDER — SODIUM CHLORIDE 0.9% FLUSH: 3.0000 mL | INTRAVENOUS | Status: DC | PRN

## 2018-09-27 MED ORDER — LACTATED RINGERS IV SOLN
INTRAVENOUS | Status: DC | PRN
  Administered 2018-09-27 (×2): via INTRAVENOUS

## 2018-09-27 MED ORDER — CITALOPRAM HYDROBROMIDE 20 MG PO TABS
40.0000 mg | ORAL_TABLET | Freq: Every day | ORAL | Status: DC
  Administered 2018-09-28: 10:00:00 40 mg via ORAL
  Filled 2018-09-27: qty 2

## 2018-09-27 MED ORDER — ONDANSETRON HCL 4 MG PO TABS: 4.0000 mg | ORAL_TABLET | Freq: Three times a day (TID) | ORAL | 0 refills | Status: DC | PRN

## 2018-09-27 MED ORDER — LIDOCAINE 2% (20 MG/ML) 5 ML SYRINGE
INTRAMUSCULAR | Status: DC | PRN
  Administered 2018-09-27: 60 mg via INTRAVENOUS

## 2018-09-27 MED ORDER — MAGNESIUM CITRATE PO SOLN
0.5000 | Freq: Once | ORAL | Status: AC
  Administered 2018-09-27: 23:00:00 0.5 via ORAL
  Filled 2018-09-27: qty 296

## 2018-09-27 MED ORDER — ROCURONIUM BROMIDE 10 MG/ML (PF) SYRINGE
PREFILLED_SYRINGE | INTRAVENOUS | Status: AC
  Filled 2018-09-27: qty 20

## 2018-09-27 MED ORDER — ACETAMINOPHEN 10 MG/ML IV SOLN
INTRAVENOUS | Status: AC
  Filled 2018-09-27: qty 100

## 2018-09-27 MED ORDER — LACTATED RINGERS IV SOLN
INTRAVENOUS | Status: DC
  Administered 2018-09-27: 16:00:00 via INTRAVENOUS

## 2018-09-27 MED ORDER — PROPOFOL 10 MG/ML IV BOLUS
INTRAVENOUS | Status: DC | PRN
  Administered 2018-09-27: 170 mg via INTRAVENOUS

## 2018-09-27 MED ORDER — ONDANSETRON HCL 4 MG/2ML IJ SOLN
INTRAMUSCULAR | Status: DC | PRN
  Administered 2018-09-27: 4 mg via INTRAVENOUS

## 2018-09-27 MED ORDER — BUPIVACAINE-EPINEPHRINE (PF) 0.25% -1:200000 IJ SOLN
INTRAMUSCULAR | Status: DC | PRN
  Administered 2018-09-27: 20 mL

## 2018-09-27 MED ORDER — BUPIVACAINE LIPOSOME 1.3 % IJ SUSP
20.0000 mL | INTRAMUSCULAR | Status: DC
  Filled 2018-09-27: qty 20

## 2018-09-27 MED ORDER — ACETAMINOPHEN 650 MG RE SUPP: 650.0000 mg | RECTAL | Status: DC | PRN

## 2018-09-27 MED ORDER — METHOCARBAMOL 500 MG PO TABS: 500.0000 mg | ORAL_TABLET | Freq: Three times a day (TID) | ORAL | 0 refills | Status: AC | PRN

## 2018-09-27 MED ORDER — BUPIVACAINE-EPINEPHRINE (PF) 0.25% -1:200000 IJ SOLN
INTRAMUSCULAR | Status: AC
  Filled 2018-09-27: qty 30

## 2018-09-27 MED ORDER — GABAPENTIN 300 MG PO CAPS
300.0000 mg | ORAL_CAPSULE | Freq: Three times a day (TID) | ORAL | Status: DC
  Administered 2018-09-27 – 2018-09-28 (×3): 300 mg via ORAL
  Filled 2018-09-27 (×3): qty 1

## 2018-09-27 MED ORDER — FENTANYL CITRATE (PF) 100 MCG/2ML IJ SOLN
INTRAMUSCULAR | Status: AC
  Administered 2018-09-27: 15:00:00 50 ug via INTRAVENOUS
  Filled 2018-09-27: qty 2

## 2018-09-27 MED ORDER — LACTATED RINGERS IV SOLN
INTRAVENOUS | Status: DC | PRN
  Administered 2018-09-27 (×2): via INTRAVENOUS

## 2018-09-27 MED ORDER — ENOXAPARIN SODIUM 30 MG/0.3ML ~~LOC~~ SOLN
30.0000 mg | SUBCUTANEOUS | Status: DC
  Administered 2018-09-28: 10:00:00 30 mg via SUBCUTANEOUS
  Filled 2018-09-27: qty 0.3

## 2018-09-27 MED ORDER — OXYCODONE HCL 5 MG PO TABS: 5.0000 mg | ORAL_TABLET | ORAL | Status: DC | PRN

## 2018-09-27 MED ORDER — METHOCARBAMOL 1000 MG/10ML IJ SOLN
500.0000 mg | Freq: Four times a day (QID) | INTRAVENOUS | Status: DC | PRN
  Filled 2018-09-27: qty 5

## 2018-09-27 MED ORDER — AMLODIPINE BESYLATE 5 MG PO TABS
5.0000 mg | ORAL_TABLET | Freq: Every day | ORAL | Status: DC
  Administered 2018-09-28: 5 mg via ORAL
  Filled 2018-09-27: qty 1

## 2018-09-27 MED ORDER — ESMOLOL HCL 100 MG/10ML IV SOLN
INTRAVENOUS | Status: DC | PRN
  Administered 2018-09-27: 10 mg via INTRAVENOUS

## 2018-09-27 MED ORDER — ONDANSETRON HCL 4 MG/2ML IJ SOLN
INTRAMUSCULAR | Status: AC
  Filled 2018-09-27: qty 2

## 2018-09-27 MED ORDER — METHOCARBAMOL 500 MG PO TABS
500.0000 mg | ORAL_TABLET | Freq: Four times a day (QID) | ORAL | Status: DC | PRN
  Administered 2018-09-27 – 2018-09-28 (×4): 500 mg via ORAL
  Filled 2018-09-27 (×4): qty 1

## 2018-09-27 MED ORDER — SODIUM CHLORIDE 0.9 % IV SOLN: 250.0000 mL | INTRAVENOUS | Status: DC

## 2018-09-27 MED ORDER — OXYCODONE HCL 5 MG/5ML PO SOLN: 5.0000 mg | Freq: Once | ORAL | Status: DC | PRN

## 2018-09-27 MED ORDER — ONDANSETRON HCL 4 MG/2ML IJ SOLN: 4.0000 mg | Freq: Once | INTRAMUSCULAR | Status: DC | PRN

## 2018-09-27 MED ORDER — DEXAMETHASONE SODIUM PHOSPHATE 10 MG/ML IJ SOLN
INTRAMUSCULAR | Status: AC
  Filled 2018-09-27: qty 1

## 2018-09-27 MED ORDER — PROPOFOL 10 MG/ML IV BOLUS
INTRAVENOUS | Status: AC
  Filled 2018-09-27: qty 20

## 2018-09-27 MED ORDER — ROCURONIUM BROMIDE 10 MG/ML (PF) SYRINGE
PREFILLED_SYRINGE | INTRAVENOUS | Status: DC | PRN
  Administered 2018-09-27: 50 mg via INTRAVENOUS
  Administered 2018-09-27 (×2): 20 mg via INTRAVENOUS
  Administered 2018-09-27 (×2): 30 mg via INTRAVENOUS

## 2018-09-27 MED ORDER — DEXAMETHASONE SODIUM PHOSPHATE 10 MG/ML IJ SOLN
INTRAMUSCULAR | Status: DC | PRN
  Administered 2018-09-27: 4 mg via INTRAVENOUS

## 2018-09-27 MED ORDER — FENTANYL CITRATE (PF) 250 MCG/5ML IJ SOLN
INTRAMUSCULAR | Status: AC
  Filled 2018-09-27: qty 5

## 2018-09-27 MED ORDER — FLEET ENEMA 7-19 GM/118ML RE ENEM: 1.0000 | ENEMA | Freq: Once | RECTAL | Status: AC

## 2018-09-27 MED ORDER — THROMBIN 5000 UNITS EX SOLR
CUTANEOUS | Status: AC
  Filled 2018-09-27: qty 5000

## 2018-09-27 MED ORDER — LABETALOL HCL 5 MG/ML IV SOLN
INTRAVENOUS | Status: AC
  Filled 2018-09-27: qty 4

## 2018-09-27 MED ORDER — GLYCOPYRROLATE 0.2 MG/ML IJ SOLN
INTRAMUSCULAR | Status: DC | PRN
  Administered 2018-09-27: 0.1 mg via INTRAVENOUS

## 2018-09-27 MED ORDER — SODIUM CHLORIDE 0.9 % IV SOLN
INTRAVENOUS | Status: DC | PRN
  Administered 2018-09-27: 15 ug/min via INTRAVENOUS

## 2018-09-27 MED ORDER — LISINOPRIL 20 MG PO TABS
40.0000 mg | ORAL_TABLET | Freq: Every day | ORAL | Status: DC
  Administered 2018-09-28: 40 mg via ORAL
  Filled 2018-09-27: qty 2

## 2018-09-27 MED ORDER — MIDAZOLAM HCL 2 MG/2ML IJ SOLN
INTRAMUSCULAR | Status: AC
  Filled 2018-09-27: qty 2

## 2018-09-27 MED ORDER — FENTANYL CITRATE (PF) 100 MCG/2ML IJ SOLN
25.0000 ug | INTRAMUSCULAR | Status: DC | PRN
  Administered 2018-09-27 (×3): 50 ug via INTRAVENOUS

## 2018-09-27 MED ORDER — MIDAZOLAM HCL 2 MG/2ML IJ SOLN
INTRAMUSCULAR | Status: DC | PRN
  Administered 2018-09-27: 2 mg via INTRAVENOUS

## 2018-09-27 MED ORDER — FENTANYL CITRATE (PF) 100 MCG/2ML IJ SOLN
INTRAMUSCULAR | Status: AC
  Filled 2018-09-27: qty 2

## 2018-09-27 MED ORDER — ACETAMINOPHEN 10 MG/ML IV SOLN
INTRAVENOUS | Status: DC | PRN
  Administered 2018-09-27: 1000 mg via INTRAVENOUS

## 2018-09-27 MED ORDER — ESMOLOL HCL 100 MG/10ML IV SOLN
INTRAVENOUS | Status: AC
  Filled 2018-09-27: qty 10

## 2018-09-27 MED ORDER — ENOXAPARIN SODIUM 40 MG/0.4ML ~~LOC~~ SOLN: 40.0000 mg | SUBCUTANEOUS | 0 refills | Status: DC

## 2018-09-27 MED ORDER — MENTHOL 3 MG MT LOZG: 1.0000 | LOZENGE | OROMUCOSAL | Status: DC | PRN

## 2018-09-27 MED ORDER — 0.9 % SODIUM CHLORIDE (POUR BTL) OPTIME
TOPICAL | Status: DC | PRN
  Administered 2018-09-27: 1000 mL

## 2018-09-27 MED ORDER — OXYCODONE HCL 5 MG PO TABS: 5.0000 mg | ORAL_TABLET | Freq: Once | ORAL | Status: DC | PRN

## 2018-09-27 MED ORDER — LABETALOL HCL 5 MG/ML IV SOLN
INTRAVENOUS | Status: DC | PRN
  Administered 2018-09-27: 10 mg via INTRAVENOUS
  Administered 2018-09-27 (×2): 5 mg via INTRAVENOUS

## 2018-09-27 MED ORDER — FENTANYL CITRATE (PF) 100 MCG/2ML IJ SOLN
INTRAMUSCULAR | Status: DC | PRN
  Administered 2018-09-27 (×3): 50 ug via INTRAVENOUS
  Administered 2018-09-27 (×4): 100 ug via INTRAVENOUS

## 2018-09-27 MED ORDER — CEFAZOLIN SODIUM-DEXTROSE 2-4 GM/100ML-% IV SOLN
2.0000 g | INTRAVENOUS | Status: AC
  Administered 2018-09-27: 2 g via INTRAVENOUS

## 2018-09-27 MED ORDER — SODIUM CHLORIDE 0.9% FLUSH
3.0000 mL | Freq: Two times a day (BID) | INTRAVENOUS | Status: DC
  Administered 2018-09-27: 21:00:00 3 mL via INTRAVENOUS

## 2018-09-27 MED ORDER — GLYCOPYRROLATE PF 0.2 MG/ML IJ SOSY
PREFILLED_SYRINGE | INTRAMUSCULAR | Status: AC
  Filled 2018-09-27: qty 1

## 2018-09-27 MED ORDER — ACETAMINOPHEN 325 MG PO TABS
650.0000 mg | ORAL_TABLET | ORAL | Status: DC | PRN
  Administered 2018-09-27 – 2018-09-28 (×2): 650 mg via ORAL
  Filled 2018-09-27 (×2): qty 2

## 2018-09-27 MED ORDER — SUGAMMADEX SODIUM 200 MG/2ML IV SOLN
INTRAVENOUS | Status: DC | PRN
  Administered 2018-09-27: 260 mg via INTRAVENOUS

## 2018-09-27 MED ORDER — HEMOSTATIC AGENTS (NO CHARGE) OPTIME
TOPICAL | Status: DC | PRN
  Administered 2018-09-27 (×3): 1 via TOPICAL

## 2018-09-27 MED ORDER — ONDANSETRON HCL 4 MG/2ML IJ SOLN: INTRAMUSCULAR | Status: DC | PRN

## 2018-09-27 MED ORDER — OXYCODONE-ACETAMINOPHEN 10-325 MG PO TABS: 1.0000 | ORAL_TABLET | Freq: Four times a day (QID) | ORAL | 0 refills | Status: AC | PRN

## 2018-09-27 MED ORDER — OXYCODONE HCL 5 MG PO TABS
10.0000 mg | ORAL_TABLET | ORAL | Status: DC | PRN
  Administered 2018-09-27 – 2018-09-28 (×7): 10 mg via ORAL
  Filled 2018-09-27 (×7): qty 2

## 2018-09-27 MED ORDER — THROMBIN (RECOMBINANT) 20000 UNITS EX SOLR
CUTANEOUS | Status: AC
  Filled 2018-09-27: qty 20000

## 2018-09-27 MED ORDER — CEFAZOLIN SODIUM-DEXTROSE 1-4 GM/50ML-% IV SOLN
1.0000 g | Freq: Three times a day (TID) | INTRAVENOUS | Status: AC
  Administered 2018-09-27 (×2): 1 g via INTRAVENOUS
  Filled 2018-09-27 (×2): qty 50

## 2018-09-27 MED ORDER — ONDANSETRON HCL 4 MG/2ML IJ SOLN: 4.0000 mg | Freq: Four times a day (QID) | INTRAMUSCULAR | Status: DC | PRN

## 2018-09-27 SURGICAL SUPPLY — 111 items
APPLIER CLIP 11 MED OPEN (CLIP) ×8
BLADE CLIPPER SURG (BLADE) ×2 IMPLANT
BLADE SURG 10 STRL SS (BLADE) ×4 IMPLANT
BONE VIVIGEN FORMABLE 5.4CC (Bone Implant) ×4 IMPLANT
CABLE BIPOLOR RESECTION CORD (MISCELLANEOUS) ×4 IMPLANT
CATH FOLEY 2WAY SLVR  5CC 16FR (CATHETERS) ×2
CATH FOLEY 2WAY SLVR 5CC 16FR (CATHETERS) ×2 IMPLANT
CLIP APPLIE 11 MED OPEN (CLIP) ×4 IMPLANT
CLIP VESOCCLUDE MED 24/CT (CLIP) ×4 IMPLANT
CLIP VESOCCLUDE MED 6/CT (CLIP) ×2 IMPLANT
CLIP VESOCCLUDE SM WIDE 24/CT (CLIP) ×6 IMPLANT
CLIP VESOLOCK MED 6/CT (CLIP) ×2 IMPLANT
CLOSURE STERI-STRIP 1/2X4 (GAUZE/BANDAGES/DRESSINGS) ×1
CLSR STERI-STRIP ANTIMIC 1/2X4 (GAUZE/BANDAGES/DRESSINGS) ×1 IMPLANT
COVER ASSEMBLY 12MM (Orthopedic Implant) ×2 IMPLANT
COVER BACK TABLE 60X90IN (DRAPES) ×4 IMPLANT
COVER SURGICAL LIGHT HANDLE (MISCELLANEOUS) ×4 IMPLANT
COVER WAND RF STERILE (DRAPES) ×4 IMPLANT
DERMABOND ADVANCED (GAUZE/BANDAGES/DRESSINGS) ×2
DERMABOND ADVANCED .7 DNX12 (GAUZE/BANDAGES/DRESSINGS) ×2 IMPLANT
DRAPE C-ARM 42X72 X-RAY (DRAPES) ×8 IMPLANT
DRAPE C-ARMOR (DRAPES) ×4 IMPLANT
DRAPE INCISE IOBAN 66X45 STRL (DRAPES) ×4 IMPLANT
DRAPE POUCH INSTRU U-SHP 10X18 (DRAPES) ×4 IMPLANT
DRAPE SURG 17X23 STRL (DRAPES) ×4 IMPLANT
DRAPE U-SHAPE 47X51 STRL (DRAPES) ×4 IMPLANT
DRSG OPSITE POSTOP 4X6 (GAUZE/BANDAGES/DRESSINGS) ×2 IMPLANT
DRSG OPSITE POSTOP 4X8 (GAUZE/BANDAGES/DRESSINGS) ×4 IMPLANT
DURAPREP 26ML APPLICATOR (WOUND CARE) ×4 IMPLANT
ELECT BLADE 4.0 EZ CLEAN MEGAD (MISCELLANEOUS) ×4
ELECT BLADE 6.5 EXT (BLADE) ×2 IMPLANT
ELECT CAUTERY BLADE 6.4 (BLADE) ×4 IMPLANT
ELECT PENCIL ROCKER SW 15FT (MISCELLANEOUS) ×4 IMPLANT
ELECT REM PT RETURN 9FT ADLT (ELECTROSURGICAL) ×4
ELECTRODE BLDE 4.0 EZ CLN MEGD (MISCELLANEOUS) ×2 IMPLANT
ELECTRODE REM PT RTRN 9FT ADLT (ELECTROSURGICAL) ×2 IMPLANT
FLOSEAL 10ML (HEMOSTASIS) ×2 IMPLANT
GAUZE 4X4 16PLY RFD (DISPOSABLE) IMPLANT
GLOVE BIO SURGEON STRL SZ 6.5 (GLOVE) ×3 IMPLANT
GLOVE BIO SURGEON STRL SZ7.5 (GLOVE) ×2 IMPLANT
GLOVE BIO SURGEONS STRL SZ 6.5 (GLOVE) ×1
GLOVE BIOGEL PI IND STRL 6.5 (GLOVE) ×2 IMPLANT
GLOVE BIOGEL PI IND STRL 7.5 (GLOVE) ×2 IMPLANT
GLOVE BIOGEL PI IND STRL 8.5 (GLOVE) ×4 IMPLANT
GLOVE BIOGEL PI INDICATOR 6.5 (GLOVE) ×2
GLOVE BIOGEL PI INDICATOR 7.5 (GLOVE) ×2
GLOVE BIOGEL PI INDICATOR 8.5 (GLOVE) ×4
GLOVE SS BIOGEL STRL SZ 8.5 (GLOVE) ×4 IMPLANT
GLOVE SUPERSENSE BIOGEL SZ 8.5 (GLOVE) ×4
GLOVE SURG SS PI 7.5 STRL IVOR (GLOVE) ×4 IMPLANT
GOWN STRL REUS W/ TWL LRG LVL3 (GOWN DISPOSABLE) ×6 IMPLANT
GOWN STRL REUS W/ TWL XL LVL3 (GOWN DISPOSABLE) ×2 IMPLANT
GOWN STRL REUS W/TWL 2XL LVL3 (GOWN DISPOSABLE) ×12 IMPLANT
GOWN STRL REUS W/TWL LRG LVL3 (GOWN DISPOSABLE) ×6
GOWN STRL REUS W/TWL XL LVL3 (GOWN DISPOSABLE) ×2
GRAFT BNE MATRIX VG FRMBL MD 5 (Bone Implant) IMPLANT
HEMOSTAT SNOW SURGICEL 2X4 (HEMOSTASIS) IMPLANT
INSERT FOGARTY 61MM (MISCELLANEOUS) IMPLANT
INSERT FOGARTY SM (MISCELLANEOUS) IMPLANT
INTERPLATE 34X12X8 (Plate) ×4 IMPLANT
KIT BASIN OR (CUSTOM PROCEDURE TRAY) ×4 IMPLANT
KIT TURNOVER KIT B (KITS) ×8 IMPLANT
LIGHT SOURCE STRAIGHT TIP (INSTRUMENTS) ×2 IMPLANT
LOOP VESSEL MAXI BLUE (MISCELLANEOUS) ×4 IMPLANT
LOOP VESSEL MINI RED (MISCELLANEOUS) ×4 IMPLANT
NDL SPNL 18GX3.5 QUINCKE PK (NEEDLE) ×2 IMPLANT
NEEDLE SPNL 18GX3.5 QUINCKE PK (NEEDLE) ×8 IMPLANT
NS IRRIG 1000ML POUR BTL (IV SOLUTION) ×4 IMPLANT
PACK LAMINECTOMY ORTHO (CUSTOM PROCEDURE TRAY) ×4 IMPLANT
PACK UNIVERSAL I (CUSTOM PROCEDURE TRAY) ×4 IMPLANT
PAD ARMBOARD 7.5X6 YLW CONV (MISCELLANEOUS) ×16 IMPLANT
PEEK SPACER INTERPLAT 30X12X8 (Peek) ×2 IMPLANT
PLATE SPINAL INTERPLAT 34X12X8 (Plate) IMPLANT
SCREW BONE RESCUE (Screw) ×4 IMPLANT
SCREW BONE STANDARD (Screw) ×4 IMPLANT
SCREW BONE STD (Screw) IMPLANT
SPONGE INTESTINAL PEANUT (DISPOSABLE) ×12 IMPLANT
SPONGE LAP 18X18 RF (DISPOSABLE) IMPLANT
SPONGE LAP 4X18 RFD (DISPOSABLE) IMPLANT
SPONGE SURGIFOAM ABS GEL 100 (HEMOSTASIS) ×2 IMPLANT
SUT BONE WAX W31G (SUTURE) ×2 IMPLANT
SUT MNCRL AB 4-0 PS2 18 (SUTURE) ×4 IMPLANT
SUT MON AB 3-0 SH 27 (SUTURE) ×2
SUT MON AB 3-0 SH27 (SUTURE) ×2 IMPLANT
SUT PDS AB 1 CTX 36 (SUTURE) ×8 IMPLANT
SUT PROLENE 4 0 RB 1 (SUTURE) ×8
SUT PROLENE 4-0 RB1 .5 CRCL 36 (SUTURE) ×8 IMPLANT
SUT PROLENE 5 0 C 1 24 (SUTURE) IMPLANT
SUT PROLENE 5 0 CC1 (SUTURE) IMPLANT
SUT PROLENE 6 0 C 1 30 (SUTURE) ×4 IMPLANT
SUT PROLENE 6 0 CC (SUTURE) IMPLANT
SUT SILK 0 TIES 10X30 (SUTURE) ×4 IMPLANT
SUT SILK 2 0 TIES 10X30 (SUTURE) ×8 IMPLANT
SUT SILK 2 0SH CR/8 30 (SUTURE) IMPLANT
SUT SILK 3 0 TIES 10X30 (SUTURE) ×8 IMPLANT
SUT SILK 3 0SH CR/8 30 (SUTURE) IMPLANT
SUT VIC AB 0 CT1 27 (SUTURE) ×2
SUT VIC AB 0 CT1 27XBRD ANBCTR (SUTURE) ×2 IMPLANT
SUT VIC AB 1 CT1 27 (SUTURE) ×4
SUT VIC AB 1 CT1 27XBRD ANBCTR (SUTURE) ×4 IMPLANT
SUT VIC AB 2-0 CT1 18 (SUTURE) ×4 IMPLANT
SUT VIC AB 2-0 CT1 27 (SUTURE) ×2
SUT VIC AB 2-0 CT1 TAPERPNT 27 (SUTURE) ×2 IMPLANT
SUT VIC AB 3-0 SH 27 (SUTURE) ×2
SUT VIC AB 3-0 SH 27X BRD (SUTURE) ×2 IMPLANT
SYR BULB IRRIGATION 50ML (SYRINGE) ×4 IMPLANT
TAPE STRIPS DRAPE STRL (GAUZE/BANDAGES/DRESSINGS) ×2 IMPLANT
TOWEL GREEN STERILE (TOWEL DISPOSABLE) ×8 IMPLANT
TOWEL GREEN STERILE FF (TOWEL DISPOSABLE) ×4 IMPLANT
TRAP SPECIMEN MUCOUS 40CC (MISCELLANEOUS) ×4 IMPLANT
WATER STERILE IRR 1000ML POUR (IV SOLUTION) ×4 IMPLANT

## 2018-09-27 NOTE — Brief Op Note (Signed)
09/27/2018  1:52 PM  PATIENT:  [name]  63 y.o. female  PRE-OPERATIVE DIAGNOSIS:  L4-5 left foraminal herniated disc, degenerative disc disease L5-S1  POST-OPERATIVE DIAGNOSIS:  L4-5 left foraminal herniated disc, degenerative disc disease L5-S1  PROCEDURE:  Procedure(s) with comments: ANTERIOR LUMBAR FUSION L5-S1, (Left) - 4.5 HRS/ DR. Trula Slade ASSIST ABDOMINAL EXPOSURE (N/A) L4-L5 Lumbar Laminectomy/Decompression (Left)  SURGEON:  Surgeon(s) and Role: Panel 1:    Melina Schools, MD - Primary Panel 2:    * Serafina Mitchell, MD - Primary  PHYSICIAN ASSISTANT:   ASSISTANTS: Amanda Ward, PA   ANESTHESIA:   general  EBL:  200 mL   BLOOD ADMINISTERED:none  DRAINS: none   LOCAL MEDICATIONS USED:  MARCAINE     SPECIMEN:  No Specimen  DISPOSITION OF SPECIMEN:  N/A  COUNTS:  YES  TOURNIQUET:  * No tourniquets in log *  DICTATION: .Dragon Dictation  PLAN OF CARE: Admit to inpatient   PATIENT DISPOSITION:  PACU - hemodynamically stable.

## 2018-09-27 NOTE — Interval H&P Note (Signed)
History and Physical Interval Note:  09/27/2018 8:25 AM  [name]  has presented today for surgery, with the diagnosis of L4-5 left foraminal herniated disc, degenerative disc disease L5-S1.  The various methods of treatment have been discussed with the patient and family. After consideration of risks, benefits and other options for treatment, the patient has consented to  Procedure(s) with comments: ANTERIOR LUMBAR FUSION L5-S1, POSTERIOR DISECTOMY LEFT L4-5 (Left) - 4.5 HRS/ DR. Trula Slade ASSIST ABDOMINAL EXPOSURE (N/A) as a surgical intervention.  The patient's history has been reviewed, patient examined, no change in status, stable for surgery.  I have reviewed the patient's chart and labs.  Questions were answered to the patient's satisfaction.     Annamarie Major

## 2018-09-27 NOTE — H&P (Signed)
Addendum dictation by Dr. Rolena Infante  Patient continues to have severe back buttock and neuropathic left leg pain.  She has a positive femoral stretch test consistent with the foraminal L4-5 disc herniation with compression of the exiting L4 nerve root.  She also has dysesthesias and pain in the L5 dermatome consistent with the foraminal stenosis secondary to the degenerative disc disease and collapse at the L5-S1 level.  We have gone over the surgical procedure which will be an anterior lumbar interbody fusion at L5-S1.  This would allow removal of the painful degenerated disc as well as indirect foraminal decompression.  Patient will then be turned into the prone position for a left foraminal decompression/discectomy to address the L4 nerve compression due to the disc herniation.  I have explained to her the procedure again as well as the risks and benefits and she continues to express an understanding.  All of her questions were encouraged.

## 2018-09-27 NOTE — Anesthesia Postprocedure Evaluation (Signed)
Anesthesia Post Note  Patient: KALENE CUTLER  Procedure(s) Performed: ANTERIOR LUMBAR FUSION L5-S1, (Left Spine Lumbar) L4-L5 Lumbar Laminectomy/Decompression (Left Spine Lumbar) ABDOMINAL EXPOSURE (N/A Abdomen)     Patient location during evaluation: PACU Anesthesia Type: General and Regional Level of consciousness: awake and alert Pain management: pain level controlled Vital Signs Assessment: post-procedure vital signs reviewed and stable Respiratory status: spontaneous breathing, nonlabored ventilation, respiratory function stable and patient connected to nasal cannula oxygen Cardiovascular status: blood pressure returned to baseline and stable Postop Assessment: no apparent nausea or vomiting Anesthetic complications: no    Last Vitals:  Vitals:   09/27/18 1533 09/27/18 1549  BP: 123/89 132/80  Pulse: 71 87  Resp: 13 20  Temp: (!) 36.1 C (!) 36.4 C  SpO2: 94% 97%    Last Pain:  Vitals:   09/27/18 1610  TempSrc:   PainSc: 8                  Charnise Lovan COKER

## 2018-09-27 NOTE — Anesthesia Procedure Notes (Signed)
Procedure Name: Intubation Date/Time: 09/27/2018 8:43 AM Performed by: Leonor Liv, CRNA Pre-anesthesia Checklist: Patient identified, Emergency Drugs available, Suction available and Patient being monitored Patient Re-evaluated:Patient Re-evaluated prior to induction Oxygen Delivery Method: Circle System Utilized Preoxygenation: Pre-oxygenation with 100% oxygen Induction Type: IV induction Ventilation: Mask ventilation without difficulty and Oral airway inserted - appropriate to patient size Laryngoscope Size: Mac and 3 Grade View: Grade I Tube type: Oral Tube size: 7.0 mm Number of attempts: 1 Airway Equipment and Method: Stylet and Oral airway Placement Confirmation: ETT inserted through vocal cords under direct vision,  positive ETCO2 and breath sounds checked- equal and bilateral Secured at: 21 cm Tube secured with: Tape Dental Injury: Teeth and Oropharynx as per pre-operative assessment

## 2018-09-27 NOTE — Discharge Instructions (Signed)

## 2018-09-27 NOTE — OR Nursing (Signed)
Dr. Romelle Starcher, radiologist, called and gave results of portable abdominal X-ray: " NO retained foreign bodies seen." Dr. Rolena Infante made aware.

## 2018-09-27 NOTE — Op Note (Signed)
    Patient name: [name] MRN: [id number] DOB: Jan 13, 1956 Sex: female  09/27/2018 Pre-operative Diagnosis: Degenerative back disease Post-operative diagnosis:  Same Surgeon:  Annamarie Major Co-Surgeon:  Dr. Rolena Infante Procedure:   Anterior exposure L5-S1 Anesthesia: General Blood Loss: Minimal Specimens: None  Findings: Significant scarring from prior pelvic lymphadenectomy and radiation which made visualization of the iliac vessels somewhat difficult in the lower pelvis.  Ultimately however were able to get good exposure of the disc space and protect the artery pain and ureter.  Indications: The patient has been recommended for anterior approach for instrumentation at the L5-S1 disc space.  I discussed with the patient the risks and benefits of anterior exposure.  We specifically discussed the possibility of challenges with her prior history of lymph node dissection and radiation to her pelvis.  She understands that there is a possibility of aborting the procedure.  Procedure:  The patient was identified in the holding area and taken to [place]  The patient was then placed supine on the table. general anesthesia was administered.  The patient was prepped and draped in the usual sterile fashion.  A time out was called and antibiotics were administered.  Fluoroscopy was used to confirm the appropriate level of the skin incision.  A transverse left lower quadrant incision was made beginning at the midline extending out laterally to the edge of the rectus.  Cautery is used about subcutaneous tissue down to the fascia which was opened with cautery.  Subfascial flaps were raised.  I then mobilized the medial and lateral borders of the rectus muscle.  I elected to enter the retroperitoneal space from lateral to the rectus muscle.  Immediately, it was obvious that there was significant scarring within the pelvis.  It was difficult to dissect out the iliac artery and vein.  Blunt retraction was used  until we were able to identify some landmarks.  I was able to identify the ureter and protected it laterally.  I then proceeded with cephalad dissection in the retroperitoneum until I identified the sacrum.  The median sacral vessels were then ligated between clips.  Bluntly I was able to mobilize the left and right side of the spine.  Once adequate exposure was obtained, self-retaining retractor blades were placed.  Fluoroscopy was then brought in to confirm that we are at the appropriate level.  At this point, Dr. Rolena Infante took over the procedure.  Please see his detailed operative note.   Disposition: To PACU stable   V. Annamarie Major, M.D., [facility] [place] Office: [phone] Pager:  [phone]

## 2018-09-27 NOTE — Anesthesia Procedure Notes (Signed)
Anesthesia Regional Block: TAP block   Pre-Anesthetic Checklist: ,, timeout performed, Correct Patient, Correct Site, Correct Laterality, Correct Procedure, Correct Position, site marked, surgical consent, pre-op evaluation,  At surgeon's request and post-op pain management  Laterality: Left and Right  Prep: chloraprep       Needles:   Needle Type: Echogenic Stimulator Needle     Needle Length: 9cm  Needle Gauge: 22     Additional Needles:   Procedures:,,,, ultrasound used (permanent image in chart),,,,  Narrative:  Start time: 09/27/2018 2:00 PM End time: 09/27/2018 2:10 PM Injection made incrementally with aspirations every 5 mL.  Performed by: Personally   Additional Notes: Bilateral tap blocks 30 cc 0.25% bupivacaine and 20 cc 1.3% exparel injected

## 2018-09-27 NOTE — Op Note (Signed)
Operative report  Preoperative diagnosis: 1. Left far lateral disc herniation L4-5 with L4 radiculopathy.       2.  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  posterior foraminotomies and discectomy left side L4-5    2.  Anterior lumbar interbody fusion L5-S1  Approach surgeon: Dr. Annamarie Major  First assistant for both the anterior interbody fusion and posterior foraminotomy discectomy: Cleta Alberts, PA  Implant: Peek intervertebral RSB cage.  Medium 12/8 degree lordosis               Anterior lumbar 0 profile plate (separate plate from the peek interbody cage) fixed with 230 mm length screws x 2   and a 25 mm length screw.  Allograft: vivogen  Indications: [name] is a very pleasant 63 year old female with significant back buttock and radicular leg pain.  Attempts at conservative management failed to alleviate her symptoms and her quality of life continued to deteriorate.  As a result we elected to move forward with surgery.  All appropriate risks benefits and alternatives were discussed with the patient and consent was obtained.  Operative report: Patient was brought the operating room placed upon the operating room table.  After successful induction of general anesthesia and endotracheal ovation teds SCDs and a Foley were inserted.  The anterior abdomen was prepped and draped in a standard fashion.  Timeout was taken to confirm patient procedure and all other important data.  At this point time I assisted Dr. Annamarie Major in performing a left-sided retroperitoneal anterior approach to the lumbar spine.  Please refer to his dictation for specifics on the approach.  Once the vasculature was properly mobilized and the retractors were in place I placed the needle into the L5-S1 disc space.  I confirmed I was at the appropriate level and at this point Dr. Trula Slade scrubbed out.  My PA [facility] then scrubbed in to assist  me on the remainder of the anterior surgery.  An annulotomy was performed with a 10 blade scalpel then using a combination of pituitary rongeurs and Kerrison rongeurs as well as curettes I removed all of the disc material.  I gently distracted the intervertebral space and continue to remove all of the disc material until I had bleeding subchondral bone.  I then released the posterior annulus with a small curved curette from the posterior aspect of the S1 and L5 vertebral bodies.  With the disc completely excised I then applied distraction to the intervertebral space.  Under live fluoroscopy I watched the interbody space open and closed confirming had parallel endplate distraction and was not compressing the posterior foramen.  At this point I then rasped the endplates and then trialed the intervertebral spacers.  I elected to use the size 12 as it provided excellent restoration of intervertebral height as well as indirect foraminal decompression.  The peek interbody cage was obtained and packed with the allograft.  It was then [facility] to the appropriate depth.  At this point with the cage in place I then placed additional bone graft on the anterior surface of the cage and then resected a small portion of the anterior aspect of the S1 vertebral body to accommodate the cage.  I then obtained the appropriate size cage and then malleted it down over top of the sentinel bone graft and the cage.  Once was probably seated below the level of the vertebral body I proceeded with fixation.  On the left-hand  side I used an awl to broach the L5 cortex and then I placed a 25 mm length screw.  The screw had excellent purchase.  I then broached the inferior tab and placed a 30 mm length screw through the cage and into the S1 vertebral body third and final locking screw was placed on the right side into L5.  All 3 screws were tightened down and had excellent purchase.  The locking cap was then applied and 6 screws in place and  torqued off according manufacturer standards.  Irrigation was then done and I confirmed that hemostasis.  I then sequentially remove the retractors and confirmed that there was no active bleeding.  Once all retractors were removed I then closed the fascia of the rectus with a running #1 PDS.  I then closed the remainder in a layered fashion with interrupted 2-0 Vicryl suture, and 3-0 Monocryl stitches.  Steri-Strips and a dry dressing were then applied.  At this point time the drapes were removed and the patient was repositioned into the prone position.  She was placed on the Wilson frame and all bony prominences were well-padded.  The back was then prepped and draped in a standard fashion.  A second timeout was taken to again confirmed the procedure.  Using fluoroscopy identified the lateral border of the L4-5 space.  Small incision was made approximately 1/2 fingerbreadths lateral to the midline and I sharply dissected down to the deep fascia.  The deep fascia was sharply incised and I bluntly dissected through the paraspinal muscle until I could palpate the facet complex.  I then used the dilating tubes to palpate the pars and gently dissect the paraspinal muscle and sequentially increased my dilators.  The Thompson retractor blade was then placed and the dilators were removed.  I gently expanded so I had good visualization of the L4 pars.  Once I could clearly visualize the L4-5 facet and the L4 pars I then confirmed with x-ray that I was at the appropriate level.  Once confirmed I performed a lateral foraminotomy.  I took down a portion of the lateral aspect of the pars and dissected gently through the ligamentum flavum.  At this point I could now visualize the L4 nerve root in the foramen.  As was seen on the preoperative MRI just inferior to the L4 nerve root there was a prominence consistent with the disc herniation.  Once I had isolated the L4 nerve root I then identified the disc fragment and using my  nerve hook swept circumferentially and remove the fragment.  The compression on the L4 nerve root was now relieved.  I can now freely and gently mobilized the L4 nerve root.  I then swept inferiorly towards the disc space to confirm there is no other free fragments of disc material.  This point I could easily palpate from the inferior aspect of the L4 pedicle down to the superior aspect of the L5 pedicle.  I had removed the foraminal disc fragment that was seen on the preoperative MRI.  The nerve root was also directly visualized and was no longer under compression.  At this point using bipolar electrocautery I obtain hemostasis and coagulated the epidural bleeding veins.  I used FloSeal to aid in the hemostasis.  After final irrigation I confirmed that the nerve root was adequately decompressed and there was no active bleeding.  A thrombin-soaked Gelfoam patty was placed over the foraminotomy site and then remove the retractor.  I irrigated once more  and then closed in a layered fashion with interrupted #1 Vicryl suture, a running 0 Vicryl suture, interrupted 2-0 Vicryl suture, and a 3-0 Monocryl for the skin.  Steri-Strips and a dry dressing were applied.  In order to aid in postoperative analgesia at the completion of the case before extubation a tap block was done by anesthesia using Exparel.  Please refer to their note for specifics.  At the completion of the block the patient was ultimately extubated and transferred to the PACU without incident.  The end of the case all needle sponge counts were correct.

## 2018-09-27 NOTE — Anesthesia Procedure Notes (Signed)
Arterial Line Insertion Start/End6/17/2020 7:55 AM, 09/27/2018 8:05 AM Performed by: Clearnce Sorrel, CRNA, CRNA  Patient location: Pre-op. Preanesthetic checklist: patient identified, IV checked, site marked, risks and benefits discussed, surgical consent, monitors and equipment checked, pre-op evaluation, timeout performed and anesthesia consent Lidocaine 1% used for infiltration Left, radial was placed Catheter size: 20 G Hand hygiene performed  and maximum sterile barriers used  Allen's test indicative of satisfactory collateral circulation Attempts: 1 Procedure performed without using ultrasound guided technique. Following insertion, dressing applied and Biopatch. Post procedure assessment: normal  Patient tolerated the procedure well with no immediate complications.

## 2018-09-27 NOTE — Transfer of Care (Signed)
Immediate Anesthesia Transfer of Care Note  Patient: VELERA LANSDALE  Procedure(s) Performed: ANTERIOR LUMBAR FUSION L5-S1, (Left Spine Lumbar) L4-L5 Lumbar Laminectomy/Decompression (Left Spine Lumbar) ABDOMINAL EXPOSURE (N/A Abdomen)  Patient Location: PACU  Anesthesia Type:General  Level of Consciousness: drowsy  Airway & Oxygen Therapy: Patient Spontanous Breathing and Patient connected to nasal cannula oxygen  Post-op Assessment: Report given to RN, Post -op Vital signs reviewed and stable and Patient moving all extremities X 4  Post vital signs: Reviewed and stable  Last Vitals:  Vitals Value Taken Time  BP 111/66 09/27/18 1424  Temp 36.2 C 09/27/18 1424  Pulse 82 09/27/18 1431  Resp 13 09/27/18 1431  SpO2 92 % 09/27/18 1431  Vitals shown include unvalidated device data.  Last Pain:  Vitals:   09/27/18 0700  TempSrc:   PainSc: 4       Patients Stated Pain Goal: 4 (77/41/28 7867)  Complications: No apparent anesthesia complications

## 2018-09-27 NOTE — Progress Notes (Signed)
Orthopedic Tech Progress Note Patient Details:  [name] [birth year] [id number] RN said patient has brace Patient ID: [name], female   DOB: 1955-08-09, 63 y.o.   MRN: [id number]   [name] 09/27/2018, 4:16 PM

## 2018-09-28 ENCOUNTER — Encounter (HOSPITAL_COMMUNITY): Payer: Self-pay | Admitting: Orthopedic Surgery

## 2018-09-28 ENCOUNTER — Encounter (HOSPITAL_COMMUNITY): Payer: PPO

## 2018-09-28 MED ORDER — THROMBIN 20000 UNITS EX SOLR
CUTANEOUS | Status: DC | PRN
  Administered 2018-09-28: 20000 [IU] via TOPICAL

## 2018-09-28 MED ORDER — FLEET ENEMA 7-19 GM/118ML RE ENEM
1.0000 | ENEMA | Freq: Every day | RECTAL | Status: DC | PRN
  Administered 2018-09-28: 10:00:00 1 via RECTAL
  Filled 2018-09-28: qty 1

## 2018-09-28 NOTE — Progress Notes (Signed)
    Subjective: Procedure(s) (LRB): ANTERIOR LUMBAR FUSION L5-S1, (Left) ABDOMINAL EXPOSURE (N/A) L4-L5 Lumbar Laminectomy/Decompression (Left) 1 Day Post-Op  Patient reports pain as 2 on 0-10 scale.  Reports decreased leg pain reports incisional back pain   Positive void Negative bowel movement Negative flatus Negative chest pain or shortness of breath  Objective: Vital signs in last 24 hours: Temp:  [97 F (36.1 C)-98.7 F (37.1 C)] 98.7 F (37.1 C) (06/18 0407) Pulse Rate:  [71-87] 72 (06/18 0407) Resp:  [12-20] 18 (06/18 0407) BP: (111-140)/(66-89) 138/73 (06/18 0407) SpO2:  [86 %-100 %] 92 % (06/18 0407) Arterial Line BP: (148-169)/(56-64) 155/62 (06/17 1454)  Intake/Output from previous day: 06/17 0701 - 06/18 0700 In: 3170 [P.O.:120; I.V.:3000] Out: 1000 [Urine:700; Blood:300]  Labs: Recent Labs    09/25/18 1139  WBC 9.2  RBC 5.04  HCT 45.7  PLT 265   Recent Labs    09/25/18 1139  NA 139  K 3.9  CL 105  CO2 23  BUN 13  CREATININE 0.89  GLUCOSE 87  CALCIUM 9.2   Recent Labs    09/25/18 1139  INR 0.9    Physical Exam: Neurologically intact ABD soft Neurovascular intact Intact pulses distally Incision: dressing C/D/I Compartment soft Body mass index is 23.8 kg/m.   Assessment/Plan: Patient stable  xrays n/a Continue mobilization with physical therapy Continue care  Advance diet Up with therapy  1. Overall doing well this AM.  Ambulating with minimal pain 2. Intermittent radicular leg pain but no motor deficits 3. Await flatus/BM before d/c to home 4. Continue mobilization  Melina Schools, MD [facility] [phone];

## 2018-09-28 NOTE — Progress Notes (Signed)
    Subjective  - POD #1  Has already walked this am No flatus Tolerating PO  Physical Exam:  abd soft Palpable pedal pulses       Assessment/Plan:  POD #1  S/p anterior exposure of L5-S1.  Stable from exposure perspective.  Please call with questions  Wells Brabham 09/28/2018 7:44 AM --  Vitals:   09/27/18 2319 09/28/18 0407  BP: 140/83 138/73  Pulse: 81 72  Resp: 18 18  Temp: 98.4 F (36.9 C) 98.7 F (37.1 C)  SpO2: (!) 86% 92%    Intake/Output Summary (Last 24 hours) at 09/28/2018 0744 Last data filed at 09/27/2018 1535 Gross per 24 hour  Intake 3170 ml  Output 1000 ml  Net 2170 ml     Laboratory CBC    Component Value Date/Time   WBC 9.2 09/25/2018 1139   HGB 15.1 (H) 09/25/2018 1139   HCT 45.7 09/25/2018 1139   PLT 265 09/25/2018 1139    BMET    Component Value Date/Time   NA 139 09/25/2018 1139   NA 141 12/30/2016 1109   K 3.9 09/25/2018 1139   K 4.5 12/30/2016 1109   CL 105 09/25/2018 1139   CO2 23 09/25/2018 1139   CO2 28 12/30/2016 1109   GLUCOSE 87 09/25/2018 1139   GLUCOSE 97 12/30/2016 1109   BUN 13 09/25/2018 1139   BUN 5.3 (L) 12/30/2016 1109   CREATININE 0.89 09/25/2018 1139   CREATININE 0.9 12/30/2016 1109   CALCIUM 9.2 09/25/2018 1139   CALCIUM 10.1 12/30/2016 1109   GFRNONAA >60 09/25/2018 1139   GFRAA >60 09/25/2018 1139    COAG Lab Results  Component Value Date   INR 0.9 09/25/2018   No results found for: PTT  Antibiotics Anti-infectives (From admission, onward)   Start     Dose/Rate Route Frequency Ordered Stop   09/27/18 1600  ceFAZolin (ANCEF) IVPB 1 g/50 mL premix     1 g 100 mL/hr over 30 Minutes Intravenous Every 8 hours 09/27/18 1541 09/27/18 2356   09/27/18 0649  ceFAZolin (ANCEF) IVPB 2g/100 mL premix     2 g 200 mL/hr over 30 Minutes Intravenous 30 min pre-op 09/27/18 0649 09/27/18 0826   09/27/18 0643  ceFAZolin (ANCEF) 2-4 GM/100ML-% IVPB    Note to Pharmacy: Tamsen Snider   : cabinet  override      09/27/18 0643 09/27/18 0826       V. Leia Alf, M.D., [facility] [place] Office: [phone] Pager:  [phone]

## 2018-09-28 NOTE — Evaluation (Signed)
Physical Therapy Evaluation Patient Details Name: [name] MRN: [id number] DOB: 10-09-1955 Today's Date: 09/28/2018   History of Present Illness  Pt is a 63 yo female s/p anterior lumbar fusion L5-S1 with decompression.  Clinical Impression  Pt admitted with above diagnosis. Pt currently with functional limitations due to the deficits listed below (see PT Problem List). At the time of PT eval pt was able to perform transfers and ambulation with gross min guard assist to supervision for safety. Pt was educated on brace wearing schedule, precautions, car transfer, and activity progression. Pt will benefit from skilled PT to increase their independence and safety with mobility to allow discharge to the venue listed below.       Follow Up Recommendations No PT follow up;Supervision for mobility/OOB    Equipment Recommendations  None recommended by PT    Recommendations for Other Services       Precautions / Restrictions Precautions Precautions: Back Precaution Booklet Issued: Yes (comment) Precaution Comments: verbally reviewed back precautions Restrictions Weight Bearing Restrictions: No      Mobility  Bed Mobility Overal bed mobility: Needs Assistance Bed Mobility: Rolling;Sidelying to Sit Rolling: Modified independent (Device/Increase time) Sidelying to sit: Supervision       General bed mobility comments: Pt was able to transition to EOB without assistance. Increased time and VC's for proper log roll technique. Pt was able to complete with HOB flat and rails lowered to simulate home environment.   Transfers Overall transfer level: Needs assistance Equipment used: None Transfers: Sit to/from Stand Sit to Stand: Supervision         General transfer comment: No assist required. Increased time and supervision for safety.   Ambulation/Gait Ambulation/Gait assistance: Min guard Gait Distance (Feet): 300 Feet Assistive device: None Gait Pattern/deviations:  Step-through pattern;Decreased stride length;Trunk flexed Gait velocity: Decreased Gait velocity interpretation: <1.8 ft/sec, indicate of risk for recurrent falls General Gait Details: Pt grossly steady with min guard assist provided for safety without an AD.   Stairs            Wheelchair Mobility    Modified Rankin (Stroke Patients Only)       Balance Overall balance assessment: Needs assistance Sitting-balance support: Feet supported;No upper extremity supported Sitting balance-Leahy Scale: Good     Standing balance support: No upper extremity supported;During functional activity Standing balance-Leahy Scale: Fair                               Pertinent Vitals/Pain Pain Assessment: Faces Faces Pain Scale: Hurts a little bit Pain Location: back Pain Descriptors / Indicators: Discomfort Pain Intervention(s): Monitored during session    Home Living Family/patient expects to be discharged to:: Private residence Living Arrangements: Spouse/significant other   Type of Home: House Home Access: Stairs to enter Entrance Stairs-Rails: Can reach both Technical brewer of Steps: 4 Home Layout: One level Home Equipment: None      Prior Function Level of Independence: Needs assistance   Gait / Transfers Assistance Needed: furniture walking  ADL's / Homemaking Assistance Needed: assist with LB ADL        Hand Dominance   Dominant Hand: Right    Extremity/Trunk Assessment   Upper Extremity Assessment Upper Extremity Assessment: Defer to OT evaluation    Lower Extremity Assessment Lower Extremity Assessment: Generalized weakness    Cervical / Trunk Assessment Cervical / Trunk Assessment: Other exceptions Cervical / Trunk Exceptions: s/p surgery  Communication  Communication: No difficulties  Cognition Arousal/Alertness: Awake/alert Behavior During Therapy: WFL for tasks assessed/performed Overall Cognitive Status: Within Functional  Limits for tasks assessed                                        General Comments      Exercises     Assessment/Plan    PT Assessment Patient needs continued PT services  PT Problem List Decreased strength;Decreased activity tolerance;Decreased balance;Decreased mobility;Decreased knowledge of use of DME;Decreased knowledge of precautions;Cardiopulmonary status limiting activity;Decreased safety awareness;Pain       PT Treatment Interventions DME instruction;Gait training;Functional mobility training;Therapeutic activities;Therapeutic exercise;Neuromuscular re-education;Patient/family education    PT Goals (Current goals can be found in the Care Plan section)  Acute Rehab PT Goals Patient Stated Goal: Home today PT Goal Formulation: With patient Time For Goal Achievement: 10/12/18 Potential to Achieve Goals: Good    Frequency Min 5X/week   Barriers to discharge        Co-evaluation               AM-PAC PT "6 Clicks" Mobility  Outcome Measure Help needed turning from your back to your side while in a flat bed without using bedrails?: None Help needed moving from lying on your back to sitting on the side of a flat bed without using bedrails?: A Little Help needed moving to and from a bed to a chair (including a wheelchair)?: A Little Help needed standing up from a chair using your arms (e.g., wheelchair or bedside chair)?: A Little Help needed to walk in hospital room?: A Little Help needed climbing 3-5 steps with a railing? : A Little 6 Click Score: 19    End of Session Equipment Utilized During Treatment: Gait belt;Back brace Activity Tolerance: Patient tolerated treatment well Patient left: in chair;with call bell/phone within reach Nurse Communication: Mobility status PT Visit Diagnosis: Unsteadiness on feet (R26.81);Other symptoms and signs involving the nervous system (R29.898);Pain Pain - part of body: (back)    Time: 0321-2248 PT Time  Calculation (min) (ACUTE ONLY): 16 min   Charges:   PT Evaluation $PT Eval Moderate Complexity: 1 Mod          Rolinda Roan, PT, DPT Acute Rehabilitation Services Pager: [phone] Office: [phone]   Thelma Comp 09/28/2018, 12:10 PM

## 2018-09-28 NOTE — Evaluation (Signed)
Occupational Therapy Evaluation Patient Details Name: [name] MRN: [id number] DOB: 12/11/1955 Today's Date: 09/28/2018    History of Present Illness Pt is a 62 yo female s/p anterior lumbar fusion L5-S1 with decompression.   Clinical Impression   Pt PTA: living with spouse and mostly independent, but required assist with lower body ADL. Pt currently ambulating with no AD, able to donn brace with minA for cues for ways to tighten brace. Pt performing UB ADL with set-upA and LB ADL with minA education for AE. Pt able to perform figure four technique for RLE, but requires assist for LLE. Pt education provided for back precautions with handout provided. Pt appears at her functional baseline with some pain with movement. Pt has spouse to assist as needed at home. OT signing off.      Follow Up Recommendations  No OT follow up    Equipment Recommendations  None recommended by OT    Recommendations for Other Services       Precautions / Restrictions Precautions Precautions: Back Precaution Booklet Issued: Yes (comment) Precaution Comments: verbally reviewed back precautions Restrictions Weight Bearing Restrictions: No      Mobility Bed Mobility Overal bed mobility: Needs Assistance Bed Mobility: Rolling;Sidelying to Sit Rolling: Supervision Sidelying to sit: Supervision       General bed mobility comments: verbally reviewed log roll technique  Transfers Overall transfer level: Needs assistance Equipment used: None Transfers: Sit to/from Stand Sit to Stand: Supervision              Balance Overall balance assessment: Needs assistance   Sitting balance-Leahy Scale: Good       Standing balance-Leahy Scale: Fair                             ADL either performed or assessed with clinical judgement   ADL Overall ADL's : Needs assistance/impaired Eating/Feeding: Modified independent;Sitting   Grooming: Modified independent;Standing   Upper  Body Bathing: Set up;Sitting   Lower Body Bathing: Set up;Sitting/lateral leans;Sit to/from stand   Upper Body Dressing : Set up;Sitting   Lower Body Dressing: Minimal assistance;Sitting/lateral leans;Sit to/from stand;With adaptive equipment   Toilet Transfer: Min guard;Regular Toilet;Grab bars   Toileting- Clothing Manipulation and Hygiene: Min guard;Sitting/lateral lean;Sit to/from stand       Functional mobility during ADLs: Supervision/safety;Cueing for safety;Cueing for sequencing General ADL Comments: Pt education on LB AE with hip kit and pt donning pants with reacher and shown how to use LH sponge and LH shoe horn. pt not interested in sock aid.     Vision Baseline Vision/History: No visual deficits Vision Assessment?: No apparent visual deficits     Perception     Praxis      Pertinent Vitals/Pain Pain Assessment: Faces Faces Pain Scale: Hurts a little bit Pain Location: back Pain Descriptors / Indicators: Discomfort Pain Intervention(s): Limited activity within patient's tolerance     Hand Dominance Right   Extremity/Trunk Assessment Upper Extremity Assessment Upper Extremity Assessment: Generalized weakness   Lower Extremity Assessment Lower Extremity Assessment: Defer to PT evaluation   Cervical / Trunk Assessment Cervical / Trunk Assessment: Normal   Communication Communication Communication: No difficulties   Cognition Arousal/Alertness: Awake/alert Behavior During Therapy: WFL for tasks assessed/performed Overall Cognitive Status: Within Functional Limits for tasks assessed                                       General Comments       Exercises     Shoulder Instructions      Home Living Family/patient expects to be discharged to:: Private residence Living Arrangements: Spouse/significant other   Type of Home: House Home Access: Stairs to enter Entrance Stairs-Number of Steps: 4 Entrance Stairs-Rails: Can reach both Home  Layout: One level     Bathroom Shower/Tub: Tub/shower unit   Bathroom Toilet: Standard     Home Equipment: None          Prior Functioning/Environment Level of Independence: Needs assistance  Gait / Transfers Assistance Needed: furniture walking ADL's / Homemaking Assistance Needed: assist with LB ADL            OT Problem List:        OT Treatment/Interventions:      OT Goals(Current goals can be found in the care plan section)    OT Frequency:     Barriers to D/C:            Co-evaluation              AM-PAC OT "6 Clicks" Daily Activity     Outcome Measure Help from another person eating meals?: None Help from another person taking care of personal grooming?: None Help from another person toileting, which includes using toliet, bedpan, or urinal?: None Help from another person bathing (including washing, rinsing, drying)?: A Little Help from another person to put on and taking off regular upper body clothing?: None Help from another person to put on and taking off regular lower body clothing?: A Little 6 Click Score: 22   End of Session Equipment Utilized During Treatment: Back brace Nurse Communication: Mobility status  Activity Tolerance: Patient tolerated treatment well Patient left: in bed;with call bell/phone within reach  OT Visit Diagnosis: Unsteadiness on feet (R26.81);Muscle weakness (generalized) (M62.81)                Time: 0745-0805 OT Time Calculation (min): 20 min Charges:  OT General Charges $OT Visit: 1 Visit OT Evaluation $OT Eval Moderate Complexity: 1 Mod   ([place])  OTR/L Acute Rehabilitation Services Pager: [phone] Office: [phone]    J  09/28/2018, 9:02 AM 

## 2018-09-28 NOTE — Plan of Care (Signed)
Pt doing well. Pt given D/C instructions with education on Lovenox infection. Pt stated her daughter is a Marine scientist and will be able to give her the daily Lovenox infections. Verbal understanding of D/C teaching was given. Rx's were given to Pt with D/C instructions. Pt's incision is clean and dry with no sign of infection. Dopplers were D/C'd by MD. Pt D/C'd home via wheelchair @ 1655 per MD order. Pt is stable @ D/C and has no other needs at this time. Holli Humbles, RN

## 2018-09-29 ENCOUNTER — Encounter (HOSPITAL_COMMUNITY): Payer: Self-pay | Admitting: Orthopedic Surgery

## 2018-09-29 MED FILL — Thrombin (Recombinant) For Soln 20000 Unit: CUTANEOUS | Qty: 1 | Status: AC

## 2018-09-30 NOTE — Discharge Summary (Addendum)
Patient ID: [name] MRN: [id number] DOB/AGE: 63-Jul-1957 63 y.o.  Admit date: 09/27/2018 Discharge date: 09/30/2018  Admission Diagnoses:  Active Problems:   S/P lumbar fusion   Discharge Diagnoses:  Active Problems:   S/P lumbar fusion  status post Procedure(s): ANTERIOR LUMBAR FUSION L5-S1, ABDOMINAL EXPOSURE L4-L5 Lumbar Laminectomy/Decompression  Past Medical History:  Diagnosis Date  . Anxiety   . Arthritis    spinal stenosis  . Back disorder    "crooked spine"  . Bulging lumbar disc   . Cervical cancer ([place])   . Complication of anesthesia    ? hypotension 10/2016 at [facility] surgery   . Depression   . H/O degenerative disc disease   . History of radiation therapy 02/22/17-03/22/17   vaginal cuff 30 Gy in 5 fractions  . Hypertension   . Hypothyroidism    patient taken off of hypothyroid med in 11/2016   . Osteoporosis   . Thyroid disease     Surgeries: Procedure(s): ANTERIOR LUMBAR FUSION L5-S1, ABDOMINAL EXPOSURE L4-L5 Lumbar Laminectomy/Decompression on 09/27/2018   Consultants: Treatment Team:  Serafina Mitchell, MD  Discharged Condition: Improved  Hospital Course: [name] is an 63 y.o. female who was admitted 09/27/2018 for operative treatment of L4/5 disc herniation causing L4 nerve root compression and foraminal stenosis at L5/S1 due to DDD . Patient failed conservative treatments (please see the history and physical for the specifics) and had severe unremitting pain that affects sleep, daily activities and work/hobbies. After pre-op clearance, the patient was taken to the operating room on 09/27/2018 and underwent  Procedure(s): ANTERIOR LUMBAR FUSION L5-S1, ABDOMINAL EXPOSURE L4-L5 Lumbar Laminectomy/Decompression.    Patient was given perioperative antibiotics:  Anti-infectives (From admission, onward)   Start     Dose/Rate Route Frequency Ordered Stop   09/27/18 1600  ceFAZolin (ANCEF) IVPB 1 g/50 mL premix     1 g 100 mL/hr  over 30 Minutes Intravenous Every 8 hours 09/27/18 1541 09/27/18 2356   09/27/18 0649  ceFAZolin (ANCEF) IVPB 2g/100 mL premix     2 g 200 mL/hr over 30 Minutes Intravenous 30 min pre-op 09/27/18 0649 09/27/18 0826   09/27/18 0643  ceFAZolin (ANCEF) 2-4 GM/100ML-% IVPB    Note to Pharmacy: Tamsen Snider   : cabinet override      09/27/18 0643 09/27/18 0826       Patient was given sequential compression devices and early ambulation to prevent DVT.   Patient benefited maximally from hospital stay and there were no complications. At the time of discharge, the patient was urinating/moving their bowels without difficulty, tolerating a regular diet, pain is controlled with oral pain medications and they have been cleared by PT/OT.   Recent vital signs: No data found.   Recent laboratory studies: No results for input(s): WBC, HGB, HCT, PLT, NA, K, CL, CO2, BUN, CREATININE, GLUCOSE, INR, CALCIUM in the last 72 hours.  Invalid input(s): PT, 2   Discharge Medications:   Allergies as of 09/28/2018      Reactions   Zithromax [azithromycin] Rash, Other (See Comments)   Blisters in mouth    Prednisone Nausea Only   Sick to stomach      Medication List    STOP taking these medications   calcium carbonate 1500 (600 Ca) MG Tabs tablet Commonly known as: OSCAL   cholecalciferol 1000 units tablet Commonly known as: VITAMIN D   oxyCODONE-acetaminophen 5-325 MG tablet Commonly known as: PERCOCET/ROXICET Replaced by: oxyCODONE-acetaminophen 10-325 MG tablet  tiZANidine 4 MG tablet Commonly known as: ZANAFLEX   traZODone 100 MG tablet Commonly known as: DESYREL     TAKE these medications   ALPRAZolam 0.5 MG tablet Commonly known as: XANAX Take 0.5 mg by mouth 2 (two) times daily as needed for anxiety.   amLODipine 5 MG tablet Commonly known as: NORVASC Take 5 mg by mouth daily.   atorvastatin 40 MG tablet Commonly known as: LIPITOR Take 40 mg by mouth at bedtime.   CeleXA 40  MG tablet Generic drug: citalopram Take 40 mg by mouth daily.   enoxaparin 40 MG/0.4ML injection Commonly known as: LOVENOX Inject 0.4 mLs (40 mg total) into the skin daily. 10 day supply 1 injection per day   lisinopril 40 MG tablet Commonly known as: ZESTRIL Take 40 mg by mouth daily with breakfast.   methocarbamol 500 MG tablet Commonly known as: Robaxin Take 1 tablet (500 mg total) by mouth every 8 (eight) hours as needed for up to 5 days for muscle spasms.   ondansetron 4 MG tablet Commonly known as: Zofran Take 1 tablet (4 mg total) by mouth every 8 (eight) hours as needed for nausea or vomiting.   oxyCODONE-acetaminophen 10-325 MG tablet Commonly known as: Percocet Take 1 tablet by mouth every 6 (six) hours as needed for up to 5 days for pain. Replaces: oxyCODONE-acetaminophen 5-325 MG tablet   vitamin B-12 500 MCG tablet Commonly known as: CYANOCOBALAMIN Take 1,000 mcg by mouth daily.       Diagnostic Studies: Dg Chest 2 View  Result Date: 09/25/2018 CLINICAL DATA:  Preop clearance. EXAM: CHEST - 2 VIEW COMPARISON:  Chest x-ray dated 05/02/2017. FINDINGS: The heart size and mediastinal contours are within normal limits. Both lungs are clear. The visualized skeletal structures are unremarkable. IMPRESSION: No active cardiopulmonary disease. Electronically Signed   By: Franki Cabot M.D.   On: 09/25/2018 16:55   Dg Lumbar Spine 2-3 Views  Result Date: 09/27/2018 CLINICAL DATA:  Back pain EXAM: LUMBAR SPINE - 2-3 VIEW COMPARISON:  09/27/2018 FINDINGS: The patient is status post anterior fusion from L5-S1. The hardware appears intact interbody spacers noted at the L5-S1. Additional images of the lumbar spine demonstrate instrumentation at the L4-L5 level. IMPRESSION: Status post anterior lumbar fusion from L5-S1 and posterior discectomy at the L4-L5 level. Electronically Signed   By: Constance Holster M.D.   On: 09/27/2018 13:32   Dg C-arm 1-60 Min  Result Date:  09/27/2018 CLINICAL DATA:  Back pain.  Fusion. EXAM: DG C-ARM 61-120 MIN COMPARISON:  None. 09/27/2018 FLUOROSCOPY TIME:  Fluoroscopy Time:  1 minutes and 58 seconds Number of Acquired Spot Images: 6 FINDINGS: The patient is status post L5-S1 fusion. The hardware is intact. There is an interbody spacer at the L5-S1 level. Additional images demonstrate instrumentation at the L4-L5 level. IMPRESSION: Postsurgical changes of the lumbar spine as above. Electronically Signed   By: Constance Holster M.D.   On: 09/27/2018 13:30   Dg Or Local Abdomen  Result Date: 09/27/2018 CLINICAL DATA:  L5-S1 ALIF.  Incorrect instrument count. EXAM: OR LOCAL ABDOMEN COMPARISON:  Intraoperative views same day. Abdominal CT 12/30/2016. FINDINGS: 1117 hours. Expected postsurgical changes from L5-S1 anterior lumbar interbody fusion. No retained surgical instrument identified. Enteric tube noted overlying the upper lumbar spine. The visualized bowel gas pattern is normal. IMPRESSION: No evidence of retained surgical instrument following L5-S1 ALIF. These results were called by telephone at the time of interpretation on 09/27/2018 at 11:28 am to OR #4. Electronically  Signed   By: Richardean Sale M.D.   On: 09/27/2018 11:31    Discharge Instructions    Incentive spirometry RT   Complete by: As directed       Follow-up Information    Melina Schools, MD. Schedule an appointment as soon as possible for a visit in 2 weeks.   Specialty: Orthopedic Surgery Why: If symptoms worsen, For suture removal, For wound re-check Contact information: [address] [place] [address] [phone]           Discharge Plan:  discharge to home  Disposition: stable    Signed: Yvonne Kendall Ward for [facility] PA-C [facility] [phone] 09/30/2018, 1:33 PM

## 2018-10-03 MED FILL — Sodium Chloride IV Soln 0.9%: INTRAVENOUS | Qty: 1000 | Status: AC

## 2018-10-03 MED FILL — Heparin Sodium (Porcine) Inj 1000 Unit/ML: INTRAMUSCULAR | Qty: 30 | Status: AC

## 2018-10-09 ENCOUNTER — Ambulatory Visit: Payer: Self-pay | Admitting: Radiation Oncology

## 2018-10-12 DIAGNOSIS — Z79891 Long term (current) use of opiate analgesic: Secondary | ICD-10-CM | POA: Diagnosis not present

## 2018-10-12 DIAGNOSIS — G4700 Insomnia, unspecified: Secondary | ICD-10-CM | POA: Diagnosis not present

## 2018-10-12 DIAGNOSIS — F419 Anxiety disorder, unspecified: Secondary | ICD-10-CM | POA: Diagnosis not present

## 2018-10-12 DIAGNOSIS — F331 Major depressive disorder, recurrent, moderate: Secondary | ICD-10-CM | POA: Diagnosis not present

## 2018-10-12 DIAGNOSIS — I10 Essential (primary) hypertension: Secondary | ICD-10-CM | POA: Diagnosis not present

## 2018-10-12 DIAGNOSIS — M545 Low back pain: Secondary | ICD-10-CM | POA: Diagnosis not present

## 2018-10-26 ENCOUNTER — Other Ambulatory Visit: Payer: Self-pay

## 2018-10-26 ENCOUNTER — Ambulatory Visit (HOSPITAL_COMMUNITY)
Admission: RE | Admit: 2018-10-26 | Discharge: 2018-10-26 | Disposition: A | Discharge status: Home / Self Care | Payer: PPO | Source: Ambulatory Visit | Attending: Family Medicine | Admitting: Family Medicine

## 2018-10-26 DIAGNOSIS — Z1231 Encounter for screening mammogram for malignant neoplasm of breast: Secondary | ICD-10-CM | POA: Diagnosis not present

## 2018-11-09 DIAGNOSIS — I10 Essential (primary) hypertension: Secondary | ICD-10-CM | POA: Diagnosis not present

## 2018-11-09 DIAGNOSIS — M545 Low back pain: Secondary | ICD-10-CM | POA: Diagnosis not present

## 2018-11-09 DIAGNOSIS — Z79891 Long term (current) use of opiate analgesic: Secondary | ICD-10-CM | POA: Diagnosis not present

## 2018-11-09 DIAGNOSIS — F419 Anxiety disorder, unspecified: Secondary | ICD-10-CM | POA: Diagnosis not present

## 2018-11-17 ENCOUNTER — Encounter (HOSPITAL_COMMUNITY)
Admission: RE | Admit: 2018-11-17 | Discharge: 2018-11-17 | Disposition: A | Discharge status: Home / Self Care | Payer: PPO | Source: Ambulatory Visit | Attending: Family Medicine | Admitting: Family Medicine

## 2018-11-17 NOTE — Progress Notes (Signed)
Patient called to remind her of appointment for prolia.  States that she has not tolerated the last two injections and has an appointment with PCP to discuss.

## 2018-11-24 DIAGNOSIS — Z4889 Encounter for other specified surgical aftercare: Secondary | ICD-10-CM | POA: Diagnosis not present

## 2018-11-24 DIAGNOSIS — Z981 Arthrodesis status: Secondary | ICD-10-CM | POA: Diagnosis not present

## 2018-12-01 ENCOUNTER — Telehealth: Payer: Self-pay | Admitting: Obstetrics and Gynecology

## 2018-12-01 NOTE — Telephone Encounter (Signed)
Patient states that she is having pain in her vaginal area and in her left side. She states hot water does help the pain. She is seeing her oncologist 9/24, but would like to see if she should see Dr. Glo Herring before that appt.

## 2018-12-05 ENCOUNTER — Ambulatory Visit (HOSPITAL_COMMUNITY): Payer: PPO | Attending: Orthopedic Surgery | Admitting: Physical Therapy

## 2018-12-05 ENCOUNTER — Other Ambulatory Visit: Payer: Self-pay

## 2018-12-05 ENCOUNTER — Encounter (HOSPITAL_COMMUNITY): Payer: Self-pay | Admitting: Physical Therapy

## 2018-12-05 DIAGNOSIS — M5442 Lumbago with sciatica, left side: Secondary | ICD-10-CM | POA: Diagnosis not present

## 2018-12-05 DIAGNOSIS — G8929 Other chronic pain: Secondary | ICD-10-CM | POA: Diagnosis not present

## 2018-12-05 NOTE — Therapy (Signed)
[place], [place], [address] Phone: [phone]   Fax:  [phone]  Physical Therapy Evaluation  Patient Details  Name: [name] MRN: [id number] Date of Birth: [birth year] Referring Provider (PT): physical therapy    Encounter Date: 12/05/2018  PT End of Session - 12/05/18 1218    Visit Number  1    Number of Visits  8    Date for PT Re-Evaluation  01/04/19    Authorization Type  [employer]    Authorization - Visit Number  1    Authorization - Number of Visits  8    PT Start Time  1115    PT Stop Time  1215    PT Time Calculation (min)  60 min    Activity Tolerance  Patient tolerated treatment well    Behavior During Therapy  [facility] for tasks assessed/performed       Past Medical History:  Diagnosis Date  . Anxiety   . Arthritis    spinal stenosis  . Back disorder    "crooked spine"  . Bulging lumbar disc   . Cervical cancer ([place])   . Complication of anesthesia    ? hypotension 10/2016 at [facility] surgery   . Depression   . H/O degenerative disc disease   . History of radiation therapy 02/22/17-03/22/17   vaginal cuff 30 Gy in 5 fractions  . Hypertension   . Hypothyroidism    patient taken off of hypothyroid med in 11/2016   . Osteoporosis   . Thyroid disease     Past Surgical History:  Procedure Laterality Date  . ABDOMINAL EXPOSURE N/A 09/27/2018   Procedure: ABDOMINAL EXPOSURE;  Surgeon: Serafina Mitchell, MD;  Location: [facility] OR;  Service: Vascular;  Laterality: N/A;  . ANTERIOR AND POSTERIOR REPAIR N/A 11/09/2016   Procedure: ANTERIOR (CYSTOCELE) AND POSTERIOR REPAIR (RECTOCELE);  Surgeon: Jonnie Kind, MD;  [facility];  Service: Gynecology;  Laterality: N/A;  . ANTERIOR LUMBAR FUSION Left 09/27/2018   Procedure: ANTERIOR LUMBAR FUSION L5-S1,;  Surgeon: Melina Schools, MD;  Location: [place];  Service: Orthopedics;  Laterality: Left;  4.5 HRS/ DR. Trula Slade ASSIST  .  BILATERAL SALPINGECTOMY Left 11/09/2016   Procedure: LEFT SALPINGECTOMY;  Surgeon: Jonnie Kind, MD;  Location: [facility];  Service: Gynecology;  Laterality: Left;  . CATARACT EXTRACTION W/PHACO Right 07/05/2016   Procedure: CATARACT EXTRACTION PHACO AND INTRAOCULAR LENS PLACEMENT (IOC);  Surgeon: Tonny Branch, MD;  Location: [facility];  Service: Ophthalmology;  Laterality: Right;  CDE: 15.41  . CATARACT EXTRACTION W/PHACO Left 07/19/2016   Procedure: CATARACT EXTRACTION PHACO AND INTRAOCULAR LENS PLACEMENT LEFT EYE CDE= 12.65;  Surgeon: Tonny Branch, MD;  Location: [facility];  Service: Ophthalmology;  Laterality: Left;  left  . LUMBAR LAMINECTOMY/DECOMPRESSION MICRODISCECTOMY Left 09/27/2018   Procedure: L4-L5 Lumbar Laminectomy/Decompression;  Surgeon: Melina Schools, MD;  Location: [place];  Service: Orthopedics;  Laterality: Left;  . ROBOTIC PELVIC AND PARA-AORTIC LYMPH NODE DISSECTION N/A 01/11/2017   Procedure: XI ROBOTIC BILATERAL PELVIC LYMPH NODE DISSECTION;  Surgeon: [name], MD;  [facility]: [facility];  Service: Gynecology;  Laterality: N/A;  . SALPINGOOPHORECTOMY Right 11/09/2016   Procedure: RIGHT SALPINGO OOPHORECTOMY;  Surgeon: Jonnie Kind, MD;  [facility];  Service: Gynecology;  Laterality: Right;  . TUBAL LIGATION    . VAGINAL HYSTERECTOMY N/A 11/09/2016   Procedure: HYSTERECTOMY VAGINAL;  Surgeon: Jonnie Kind, MD;  [facility];  Service: Gynecology;  Laterality: N/A;    There were no vitals filed for this visit.   Subjective Assessment - 12/05/18 1126    Subjective  Ms. Manthe states that she has had cancer, she had radiation but did not need chemo about 18 months ago.  She then began having back pain in 2016 when she slipped on some ice.  She took shots every three months but then one day she had severe Lt leg pain therefore she opted to have surgery.  The surgery occured on 09/27/2018 she feels like she is doing better but she needs to take breaks with housework and some  increased pain.  She is being referred to skilled therapy for strengthening and pain relief.  She has a brace that she uses occasionally.    Pertinent History  cervical cancer, HTN, seizures,    Limitations  Lifting;Walking;House hold activities    How long can you sit comfortably?  Needs to get up from a straight back chair 20 minutes.    How long can you stand comfortably?  20 minutes    How long can you walk comfortably?  she has not walked since the surgery.    Patient Stated Goals  Love to have no pain and be able to do what she use to do , dust, clean, move her furniture around and better movement.    Currently in Pain?  Yes    Pain Score  2    worst: 3/10   Pain Location  Back    Pain Orientation  Left;Lower    Pain Descriptors / Indicators  Aching    Pain Type  Chronic pain    Pain Radiating Towards  groin    Pain Onset  More than a month ago    Pain Frequency  Intermittent    Aggravating Factors   activity    Pain Relieving Factors  meds    Effect of Pain on Daily Activities  limits         [facility] PT Assessment - 12/05/18 0001      Assessment   Medical Diagnosis  Lumbar fusion with residual pain     Referring Provider (PT)  physical therapy     Onset Date/Surgical Date  09/27/18    Next MD Visit  September 24    Prior Therapy  none      Precautions   Precautions  Back      Restrictions   Weight Bearing Restrictions  No      Balance Screen   Has the patient fallen in the past 6 months  No    Has the patient had a decrease in activity level because of a fear of falling?   Yes    Is the patient reluctant to leave their home because of a fear of falling?   No      Home Film/video editor residence      Prior Function   Level of Independence  Independent with basic ADLs      Cognition   Overall Cognitive Status  Within Functional Limits for tasks assessed      Observation/Other Assessments   Focus on Therapeutic Outcomes (FOTO)   52       Functional Tests   Functional tests  Single leg stance;Sit to Stand      Single Leg Stance   Comments  RT: 22  LT: 12      Sit to Stand   Comments  5x 26.48  Posture/Postural Control   Posture/Postural Control  Postural limitations    Postural Limitations  Decreased lumbar lordosis;Decreased thoracic kyphosis    Posture Comments  Rt hip high , Rt PSIS high       ROM / Strength   AROM / PROM / Strength  AROM;Strength      AROM   AROM Assessment Site  Lumbar    Lumbar Extension  16      Strength   Strength Assessment Site  Hip;Knee;Ankle    Right/Left Knee  Right;Left    Right Knee Extension  4/5    Left Knee Extension  4-/5    Right/Left Ankle  Right;Left    Right Ankle Dorsiflexion  5/5    Left Ankle Dorsiflexion  4-/5      Flexibility   Soft Tissue Assessment /Muscle Length  yes    Hamstrings  RT:  130; LT       Palpation   Palpation comment  scar tissue at superior aspect of scar, pt states that this bothers her.       Ambulation/Gait   Ambulation Distance (Feet)  565 Feet    Gait Comments  3 ' walk                 Objective measurements completed on examination: See above findings.      [place] [facility] PT Treatment/Exercise - 12/05/18 0001      Exercises   Exercises  Lumbar      Lumbar Exercises: Stretches   Active Hamstring Stretch  Right;Left;2 reps;30 seconds    Single Knee to Chest Stretch  Right;Left;2 reps;30 seconds    Standing Extension  5 reps      Lumbar Exercises: Standing   Heel Raises  10 reps    Functional Squats  10 reps             PT Education - 12/05/18 1217    Education Details  HEP, bed mobility body mechanics.    Person(s) Educated  Patient    Methods  Explanation    Comprehension  Verbalized understanding       PT Short Term Goals - 12/05/18 1254      PT SHORT TERM GOAL #1   Title  Pt to be completing a HEP to improve core and LE strength to allow pain level to decrease to no greater than 2/10 during  functional activities.    Time  2    Period  Weeks    Status  New    Target Date  12/19/18      PT SHORT TERM GOAL #2   Title  PT lumbar and LE flexibility to increase to allow pt to squat to pick items up off of the floor.    Time  2    Period  Weeks    Status  New        PT Long Term Goals - 12/05/18 1312      PT LONG TERM GOAL #1   Title  PT core and LE strength to increase to allow pt to rise up from a squatted position without UE assist to be able to pick items up off of the floor.    Time  4    Period  Weeks    Status  New    Target Date  01/02/19      PT LONG TERM GOAL #2   Title  PT to be able to single leg stance on each foot for at  least 40 seconds to demonstrate improved balance so that pt is confident walking on uneven terrain.    Time  4    Period  Weeks    Status  New      PT LONG TERM GOAL #3   Title  Pt to have no pain 80% of the time to allow herself to complete all of her housework without having to rest.    Time  4    Period  Weeks    Status  New             Plan - 12/05/18 1219    Clinical Impression Statement  Ms. Mcglynn is a 63 yo female who has had a recent back fusion.  She has a hx of cervical cancer and is currently having some vaginal and Lt groin pain.  She is fearful that her cancer may have came back as this is recent pain; she has a GYN appointment September 1st and is still seeing her cancer MD therefore she will address this with these physicians.  Examination for her back pain demonstrates decreased strength, decreased flexibility, increased back pain, increased scar tissue and decreased activity tolerance.  Ms. Doebler will benefit from skilled PT to address these issues and improve her level of function.    Personal Factors and Comorbidities  Age;Fitness;Comorbidity 3+    Comorbidities  cancer, smoking, HTN    Examination-Activity Limitations  Bed Mobility;Bend;Carry;Locomotion Level;Lift;Stand;Stairs    Examination-Participation  Restrictions  Cleaning;Laundry;Yard Work    Stability/Clinical Decision Making  Stable/Uncomplicated    Clinical Decision Making  Moderate    Rehab Potential  Good    PT Frequency  2x / week    PT Duration  4 weeks    PT Treatment/Interventions  Gait training;ADLs/Self Care Home Management;Patient/family education;Stair training;Therapeutic activities;Therapeutic exercise;Manual techniques    PT Next Visit Plan  begin spinal stabilization for improved strength of LE and core, balance activites and manual to scar and low back to decrease pain.    PT Home Exercise Plan  Standing extension, knee to chest, hamstring stretch, ab isometric and heel raises.       Patient will benefit from skilled therapeutic intervention in order to improve the following deficits and impairments:  Cardiopulmonary status limiting activity, Decreased activity tolerance, Decreased balance, Decreased range of motion, Decreased strength, Difficulty walking, Decreased scar mobility, Pain, Improper body mechanics, Postural dysfunction  Visit Diagnosis: Chronic left-sided low back pain with left-sided sciatica - Plan: PT plan of care cert/re-cert     Problem List Patient Active Problem List   Diagnosis Date Noted  . S/P lumbar fusion 09/27/2018  . Degeneration of lumbar intervertebral disc 04/19/2017  . Major depressive disorder, single episode, unspecified 03/02/2017  . 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 Hyperthyroidism 11/19/2016  . S/P vaginal hysterectomy with anterior, and posterior repair, right salpingo-oophorectomy and left salpingectomy 11/09/2016  . Status post vaginal hysterectomy 11/09/2016  . Moderate episode of recurrent major depressive disorder [facility]) 01/06/2016    Rayetta Humphrey, PT CLT [phone] 12/05/2018, 1:12 PM  [place] [address] [place], [place], [address] Phone: [phone]   Fax:  [phone]  Name: JAMEISHA STOFKO MRN: [id number] Date of Birth: 04/25/1955

## 2018-12-05 NOTE — Patient Instructions (Addendum)
Backward Bend (Standing)    Arch backward to make hollow of back deeper. Hold _5___ seconds. Repeat __10__ times per set. Do _1___ sets per session. Do ___2_ sessions per day.  http://orth.exer.us/178   Copyright  VHI. All rights reserved.  Knee-to-Chest Stretch: Unilateral    With hand behind right knee, pull knee in to chest until a comfortable stretch is felt in lower back and buttocks. Keep back relaxed. Hold 30____ seconds. Repeat _3___ times per set. Do _1___ sets per session. Do _2___ sessions per day.  http://orth.exer.us/126   Copyright  VHI. All rights reserved.  Hamstring Stretch: Active    Support behind right knee. Starting with knee bent, attempt to straighten knee until a comfortable stretch is felt in back of thigh. Hold ___30_ seconds. Repeat _3___ times per set. Do ____1 sets per session. Do __2__ sessions per day.  http://orth.exer.us/158  Isometric Abdominal    Lying on back with knees bent, tighten stomach by pressing elbows down. Hold __5__ seconds. Repeat __10__ times per set. Do _1___ sets per session. Do __2__ sessions per day.  http://orth.exer.us/1086   Copyright  VHI. All rights reserved.  Heel Raise: Bilateral (Standing)    Rise on balls of feet. Repeat _10__ times per set. Do __1__ sets per session. Do _2___ sessions per day.  http://orth.exer.us/38   Copyright  VHI. All rights reserved.

## 2018-12-07 DIAGNOSIS — F331 Major depressive disorder, recurrent, moderate: Secondary | ICD-10-CM | POA: Diagnosis not present

## 2018-12-07 DIAGNOSIS — M810 Age-related osteoporosis without current pathological fracture: Secondary | ICD-10-CM | POA: Diagnosis not present

## 2018-12-07 DIAGNOSIS — I10 Essential (primary) hypertension: Secondary | ICD-10-CM | POA: Diagnosis not present

## 2018-12-07 DIAGNOSIS — G4700 Insomnia, unspecified: Secondary | ICD-10-CM | POA: Diagnosis not present

## 2018-12-07 DIAGNOSIS — Z79891 Long term (current) use of opiate analgesic: Secondary | ICD-10-CM | POA: Diagnosis not present

## 2018-12-07 DIAGNOSIS — F419 Anxiety disorder, unspecified: Secondary | ICD-10-CM | POA: Diagnosis not present

## 2018-12-07 DIAGNOSIS — F17210 Nicotine dependence, cigarettes, uncomplicated: Secondary | ICD-10-CM | POA: Diagnosis not present

## 2018-12-08 ENCOUNTER — Ambulatory Visit (HOSPITAL_COMMUNITY): Payer: PPO | Admitting: Physical Therapy

## 2018-12-08 ENCOUNTER — Telehealth (HOSPITAL_COMMUNITY): Payer: Self-pay | Admitting: Physical Therapy

## 2018-12-08 NOTE — Telephone Encounter (Signed)
Patient just walked in up-set and wanted to be placed on hold-she cx all her future appointments. Said she will call back when she wants to return

## 2018-12-11 ENCOUNTER — Ambulatory Visit (HOSPITAL_COMMUNITY): Payer: PPO | Admitting: Physical Therapy

## 2018-12-11 ENCOUNTER — Telehealth: Payer: Self-pay | Admitting: Obstetrics and Gynecology

## 2018-12-11 NOTE — Telephone Encounter (Signed)

## 2018-12-12 ENCOUNTER — Ambulatory Visit: Payer: PPO | Admitting: Obstetrics and Gynecology

## 2018-12-12 ENCOUNTER — Other Ambulatory Visit (HOSPITAL_COMMUNITY)
Admission: RE | Admit: 2018-12-12 | Discharge: 2018-12-12 | Disposition: A | Discharge status: Home / Self Care | Payer: PPO | Source: Ambulatory Visit | Attending: Obstetrics and Gynecology | Admitting: Obstetrics and Gynecology

## 2018-12-12 ENCOUNTER — Encounter: Payer: Self-pay | Admitting: Obstetrics and Gynecology

## 2018-12-12 ENCOUNTER — Other Ambulatory Visit: Payer: Self-pay

## 2018-12-12 VITALS — BP 131/84 | HR 82 | Ht 66.0 in | Wt 137.0 lb

## 2018-12-12 DIAGNOSIS — R102 Pelvic and perineal pain: Secondary | ICD-10-CM | POA: Insufficient documentation

## 2018-12-12 DIAGNOSIS — Z1151 Encounter for screening for human papillomavirus (HPV): Secondary | ICD-10-CM | POA: Diagnosis not present

## 2018-12-12 DIAGNOSIS — C530 Malignant neoplasm of endocervix: Secondary | ICD-10-CM

## 2018-12-12 DIAGNOSIS — Z90710 Acquired absence of both cervix and uterus: Secondary | ICD-10-CM

## 2018-12-12 NOTE — Addendum Note (Signed)
Addended by: Diona Fanti A on: 12/12/2018 04:46 PM   Modules accepted: Orders

## 2018-12-12 NOTE — Progress Notes (Signed)
[facility] Clinic Visit  @DATE @            Patient name: [name] MRN [id number]  Date of birth: 1955/08/01  CC & HPI:  [name] is a 63 y.o. female presenting today for 1.  Left side vaginal area pain,, and actually over the mons pubis to the left of midline 2.  Some small bumps in the vaginal area that come up,   ROS:  ROS Patient is due for Pap smear status post her vaginal hysterectomy with incidental finding of an endocervical cancer  Pertinent History Reviewed:   Reviewed: Significant for no weight loss weight gain still followed by Dr. Dineen Kid for radiation therapy follow-up. Medical         Past Medical History:  Diagnosis Date  . Anxiety   . Arthritis    spinal stenosis  . Back disorder    "crooked spine"  . Bulging lumbar disc   . Cervical cancer ([place])   . Complication of anesthesia    ? hypotension 10/2016 at [facility] surgery   . Depression   . H/O degenerative disc disease   . History of radiation therapy 02/22/17-03/22/17   vaginal cuff 30 Gy in 5 fractions  . Hypertension   . Hypothyroidism    patient taken off of hypothyroid med in 11/2016   . Osteoporosis   . Thyroid disease                               Surgical Hx:    Past Surgical History:  Procedure Laterality Date  . ABDOMINAL EXPOSURE N/A 09/27/2018   Procedure: ABDOMINAL EXPOSURE;  Surgeon: Serafina Mitchell, MD;  Location: [facility] OR;  Service: Vascular;  Laterality: N/A;  . ANTERIOR AND POSTERIOR REPAIR N/A 11/09/2016   Procedure: ANTERIOR (CYSTOCELE) AND POSTERIOR REPAIR (RECTOCELE);  Surgeon: Jonnie Kind, MD;  [facility];  Service: Gynecology;  Laterality: N/A;  . ANTERIOR LUMBAR FUSION Left 09/27/2018   Procedure: ANTERIOR LUMBAR FUSION L5-S1,;  Surgeon: Melina Schools, MD;  Location: [place];  Service: Orthopedics;  Laterality: Left;  4.5 HRS/ DR. Trula Slade ASSIST  . BILATERAL SALPINGECTOMY Left 11/09/2016   Procedure: LEFT SALPINGECTOMY;  Surgeon: Jonnie Kind, MD;   [facility]: [facility];  Service: Gynecology;  Laterality: Left;  . CATARACT EXTRACTION W/PHACO Right 07/05/2016   Procedure: CATARACT EXTRACTION PHACO AND INTRAOCULAR LENS PLACEMENT (IOC);  Surgeon: Tonny Branch, MD;  [facility];  Service: Ophthalmology;  Laterality: Right;  CDE: 15.41  . CATARACT EXTRACTION W/PHACO Left 07/19/2016   Procedure: CATARACT EXTRACTION PHACO AND INTRAOCULAR LENS PLACEMENT LEFT EYE CDE= 12.65;  Surgeon: Tonny Branch, MD;  [facility];  Service: Ophthalmology;  Laterality: Left;  left  . LUMBAR LAMINECTOMY/DECOMPRESSION MICRODISCECTOMY Left 09/27/2018   Procedure: L4-L5 Lumbar Laminectomy/Decompression;  Surgeon: Melina Schools, MD;  Location: [place];  Service: Orthopedics;  Laterality: Left;  . ROBOTIC PELVIC AND PARA-AORTIC LYMPH NODE DISSECTION N/A 01/11/2017   Procedure: XI ROBOTIC BILATERAL PELVIC LYMPH NODE DISSECTION;  Surgeon: [name], MD;  [facility];  Service: Gynecology;  Laterality: N/A;  . SALPINGOOPHORECTOMY Right 11/09/2016   Procedure: RIGHT SALPINGO OOPHORECTOMY;  Surgeon: Jonnie Kind, MD;  [facility];  Service: Gynecology;  Laterality: Right;  . TUBAL LIGATION    . VAGINAL HYSTERECTOMY N/A 11/09/2016   Procedure: HYSTERECTOMY VAGINAL;  Surgeon: Jonnie Kind, MD;  [facility];  Service:  Gynecology;  Laterality: N/A;   Medications: Reviewed & Updated - see associated section                       Current Outpatient Medications:  .  ALPRAZolam (XANAX) 0.5 MG tablet, Take 0.5 mg by mouth 2 (two) times daily as needed for anxiety., Disp: , Rfl:  .  amLODipine (NORVASC) 5 MG tablet, Take 5 mg by mouth daily. , Disp: , Rfl:  .  atorvastatin (LIPITOR) 40 MG tablet, Take 40 mg by mouth at bedtime. , Disp: , Rfl:  .  enoxaparin (LOVENOX) 40 MG/0.4ML injection, Inject 0.4 mLs (40 mg total) into the skin daily. 10 day supply 1 injection per day, Disp: 10 Syringe, Rfl: 0 .  FLUoxetine (PROZAC) 10 MG capsule, Take 10 mg by mouth daily.,  Disp: , Rfl:  .  lisinopril (PRINIVIL,ZESTRIL) 40 MG tablet, Take 40 mg by mouth daily with breakfast. , Disp: , Rfl:  .  varenicline (CHANTIX PAK) 0.5 MG X 11 & 1 MG X 42 tablet, Take by mouth 2 (two) times daily. Take one 0.5 mg tablet by mouth once daily for 3 days, then increase to one 0.5 mg tablet twice daily for 4 days, then increase to one 1 mg tablet twice daily., Disp: , Rfl:  .  vitamin B-12 (CYANOCOBALAMIN) 500 MCG tablet, Take 1,000 mcg by mouth daily., Disp: , Rfl:  .  citalopram (CELEXA) 40 MG tablet, Take 40 mg by mouth daily. , Disp: , Rfl:  .  ondansetron (ZOFRAN) 4 MG tablet, Take 1 tablet (4 mg total) by mouth every 8 (eight) hours as needed for nausea or vomiting. (Patient not taking: Reported on 12/12/2018), Disp: 20 tablet, Rfl: 0   Social History: Reviewed -  reports that she has been smoking cigarettes. She has a 44.00 pack-year smoking history. She has never used smokeless tobacco.  Objective Findings:  Vitals: Blood pressure 131/84, pulse 82, height 5\' 6"  (1.676 m), weight 137 lb (62.1 kg).  PHYSICAL EXAMINATION General appearance - alert, well appearing, and in no distress Mental status - alert, oriented to person, place, and time Chest - clear to auscultation, no wheezes, rales or rhonchi, symmetric air entry Heart - normal rate and regular rhythm Abdomen - soft, nontender, nondistended, no masses or organomegaly Breasts -  Skin - normal coloration and turgor, no rashes, no suspicious skin lesions noted See pelvic exam  PELVIC patient has no hernias or adenopathy on inguinal exam External genitalia -small sebaceous cyst on right labia majora, expelled Vulva -normal-appearing skin Vagina -atrophic vaginal tissues with rectocele, management of patient with MiraLAX Cervix -  {surgically Absent Uterus -surgically absent Adnexa -no masses [place] -not done Rectal - declined by the patient    Assessment & Plan:   A:  1. Nonspecific suprapubic pain, without  focal lesion in the area of prior radiation therapy for endocervical cancer found on pathology at time of vaginal hysterectomy 2. Status post pelvic radiotherapy 3. Multiple skin comedones, expressed 4. No adenopathy or hernia, patient reassured  P:  1. Begin annual Paps at this office or Dr. Denman George GYN oncology office, 2. 2.  Patient still being followed by Dr. Sondra Come status post radiation therapy 3.  Topical skin lotions and manual expression, no significant recurrent

## 2018-12-13 ENCOUNTER — Encounter (HOSPITAL_COMMUNITY): Payer: PPO

## 2018-12-15 ENCOUNTER — Telehealth: Payer: Self-pay | Admitting: *Deleted

## 2018-12-15 LAB — CYTOLOGY - PAP
Diagnosis: NEGATIVE
HPV: NOT DETECTED

## 2018-12-15 NOTE — Telephone Encounter (Signed)
Called patient back and let her know that pap results are not back yet.

## 2018-12-19 ENCOUNTER — Telehealth: Payer: Self-pay | Admitting: *Deleted

## 2018-12-19 NOTE — Telephone Encounter (Signed)
LMOVM pap normal and can repeat in 3 years.

## 2018-12-20 ENCOUNTER — Encounter (HOSPITAL_COMMUNITY): Payer: PPO

## 2018-12-22 ENCOUNTER — Encounter (HOSPITAL_COMMUNITY): Payer: PPO

## 2018-12-25 ENCOUNTER — Encounter (HOSPITAL_COMMUNITY): Payer: PPO | Admitting: Physical Therapy

## 2018-12-26 ENCOUNTER — Telehealth: Payer: Self-pay | Admitting: *Deleted

## 2018-12-26 ENCOUNTER — Encounter (HOSPITAL_COMMUNITY): Payer: PPO

## 2018-12-26 NOTE — Telephone Encounter (Signed)
Patient informed normal pap and can repeat in 3 years.  Verbalized understanding.

## 2018-12-26 NOTE — Telephone Encounter (Signed)
Patient called requesting pap results.

## 2018-12-27 ENCOUNTER — Encounter (HOSPITAL_COMMUNITY): Payer: PPO | Admitting: Physical Therapy

## 2019-01-04 ENCOUNTER — Ambulatory Visit: Payer: PPO | Admitting: Radiation Oncology

## 2019-01-11 ENCOUNTER — Encounter: Payer: Self-pay | Admitting: Internal Medicine

## 2019-01-29 ENCOUNTER — Other Ambulatory Visit: Payer: Self-pay

## 2019-01-29 ENCOUNTER — Ambulatory Visit (HOSPITAL_COMMUNITY)
Admission: RE | Admit: 2019-01-29 | Discharge: 2019-01-29 | Disposition: A | Discharge status: Home / Self Care | Payer: PPO | Source: Ambulatory Visit | Attending: Nurse Practitioner | Admitting: Nurse Practitioner

## 2019-01-29 ENCOUNTER — Encounter: Payer: Self-pay | Admitting: Nurse Practitioner

## 2019-01-29 ENCOUNTER — Ambulatory Visit: Payer: PPO | Admitting: Nurse Practitioner

## 2019-01-29 DIAGNOSIS — Z0000 Encounter for general adult medical examination without abnormal findings: Secondary | ICD-10-CM | POA: Insufficient documentation

## 2019-01-29 DIAGNOSIS — K5900 Constipation, unspecified: Secondary | ICD-10-CM | POA: Insufficient documentation

## 2019-01-29 NOTE — Patient Instructions (Signed)
Your health issues we discussed today were:   Need for colonoscopy: 1. We will schedule your colonoscopy for you 2. Further recommendations will be made after colonoscopy  Constipation and difficulty having stools: 1. Continue taking MiraLAX for now 2. Have your abdominal x-ray done when you are able to.  You should not need an appointment for this 3. Call us if he has any worsening or severe symptoms  Overall I recommend:  1. Continue her other current medications 2. Return for follow-up in 4 months 3. Call us if you have any questions or concerns.   Because of recent events of COVID-19 ("Coronavirus"), follow CDC recommendations:  1. Wash your hand frequently 2. Avoid touching your face 3. Stay away from people who are sick 4. If you have symptoms such as fever, cough, shortness of breath then call your healthcare provider for further guidance 5. If you are sick, STAY AT HOME unless otherwise directed by your healthcare provider. 6. Follow directions from state and national officials regarding staying safe   At [facility] we value your feedback. You may receive a survey about your visit today. Please share your experience as we strive to create trusting relationships with our patients to provide genuine, compassionate, quality care.  We appreciate your understanding and patience as we review any laboratory studies, imaging, and other diagnostic tests that are ordered as we care for you. Our office policy is 5 business days for review of these results, and any emergent or urgent results are addressed in a timely manner for your best interest. If you do not hear from our office in 1 week, please contact us.   We also encourage the use of MyChart, which contains your medical information for your review as well. If you are not enrolled in this feature, an access code is on this after visit summary for your convenience. Thank you for allowing [place] to be involved in your  care.  It was great to see you today!  I hope you have a great Fall!!

## 2019-01-29 NOTE — Addendum Note (Signed)
Addended by: Gordy Levan, ERIC A on: 01/29/2019 03:30 PM   Modules accepted: Orders

## 2019-01-29 NOTE — Assessment & Plan Note (Signed)
The patient describes constipation with straining and needing to take something in order to have a bowel movement more frequently than every 1 to 2 weeks.  She typically uses MiraLAX but this causes loose stools.  When she does have a bowel movement she notes "thin, strep-like" stools.  She states she has had constipation and symptoms such as this ever since her radiation for cervical cancer about 2 years ago.  She has never had a colonoscopy.  At this point I will check an abdominal x-ray for stool burden or any significant masslike findings.  Continue MiraLAX for now.  We will set her up with a colonoscopy as per above.  Further recommendations to follow colonoscopy.  Follow-up in 4 months.

## 2019-01-29 NOTE — Assessment & Plan Note (Signed)
The patient has never had a colonoscopy before.  She is having some mild constipation symptoms that started since radiation therapy for ovarian cancer.  This is noted below.  Further recommendations related to this are also below.  At this point we will proceed with a colonoscopy at this time.  Follow-up in 4 months.  Proceed with TCS on propofol/MAC with Dr. Gala Romney in near future: the risks, benefits, and alternatives have been discussed with the patient in detail. The patient states understanding and desires to proceed.  The patient is currently on Xanax, Prozac, trazodone, Celexa.  No other anticoagulants, anxiolytics, chronic pain medications, antidepressants, antidiabetics, or iron supplements.  We will plan for the procedure on propofol/MAC to permit adequate sedation.

## 2019-01-29 NOTE — Progress Notes (Signed)
Primary Care Physician:  [name], MD Primary Gastroenterologist:  Dr. Gala Romney  Chief Complaint  Patient presents with   Colonoscopy    never had tcs   Constipation   Diarrhea    HPI:   [name] is a 63 y.o. female who presents to schedule colonoscopy.  Nurse/phone triage was deferred office visit due to medications likely necessitating augmented sedation.  Reviewed information provided with referral including office visit dated 08/09/2018 which was an annual wellness visit and for medication refills.  At that time it was noted the patient has never had a colonoscopy.  She was referred last year but did not complete this.  She was again referred for first-ever colonoscopy.  Reviewed labs provided by primary care that were drawn on 09/05/2018 including CBC which was normal, CMP which had mildly elevated creatinine 1.13 and mild elevation of glucose at 104, otherwise normal..  History of colonoscopy in our system.  Today she states she's doing well overall. She has never had a colonoscopy. He husband had a colonoscopy many years ago and, it sounds like per her description, he had a colon perforation. She has been hesitant to have one since then. Has a history of cervical CA s/p radiation x 5-6 treatments. Has intermittent LLQ discomfort with "little strings" when she has a bowel movement. She was advised to take MiraLAX but this causes loose stools. Has a bowel movement about once every 1-2 weeks unless she takes something (typically MiraLAX). Her pain often improves after a bowel movement. Stools have been "abnormal" like this since radiation 2 years ago. Previously used metamucil and OTC laxatives. Has significant straining. Unsure if stools hard. Stools are pencil-sized. Denies N/V, hematochezia, melena, fever, chills. Has had unintentional weighty loss of about 10 lbs in 3 months (subjectively).  Also has "vaginal pressure" and was told this will likely due to her constipation.  Denies URI or flu-like symptoms. Denies loss of sense of taste or smell. Denies chest pain, dyspnea, dizziness, lightheadedness, syncope, near syncope. Denies any other upper or lower GI symptoms.   Past Medical History:  Diagnosis Date   Anxiety    Arthritis    spinal stenosis   Back disorder    "crooked spine"   Bulging lumbar disc    Cervical cancer (HCC)    Complication of anesthesia    ? hypotension 10/2016 at [facility] surgery    Depression    H/O degenerative disc disease    History of radiation therapy 02/22/17-03/22/17   vaginal cuff 30 Gy in 5 fractions   Hypertension    Hypothyroidism    patient taken off of hypothyroid med in 11/2016    Osteoporosis    Thyroid disease     Past Surgical History:  Procedure Laterality Date   ABDOMINAL EXPOSURE N/A 09/27/2018   Procedure: ABDOMINAL EXPOSURE;  Surgeon: Serafina Mitchell, MD;  Location: [facility] OR;  Service: Vascular;  Laterality: N/A;   ANTERIOR AND POSTERIOR REPAIR N/A 11/09/2016   Procedure: ANTERIOR (CYSTOCELE) AND POSTERIOR REPAIR (RECTOCELE);  Surgeon: Jonnie Kind, MD;  Location: [facility];  Service: Gynecology;  Laterality: N/A;   ANTERIOR LUMBAR FUSION Left 09/27/2018   Procedure: ANTERIOR LUMBAR FUSION L5-S1,;  Surgeon: Melina Schools, MD;  Location: [place];  Service: Orthopedics;  Laterality: Left;  4.5 HRS/ DR. Trula Slade ASSIST   BILATERAL SALPINGECTOMY Left 11/09/2016   Procedure: LEFT SALPINGECTOMY;  Surgeon: Jonnie Kind, MD;  Location: [facility];  Service: Gynecology;  Laterality: Left;  CATARACT EXTRACTION W/PHACO Right 07/05/2016   Procedure: CATARACT EXTRACTION PHACO AND INTRAOCULAR LENS PLACEMENT (IOC);  Surgeon: Tonny Branch, MD;  Location: [facility];  Service: Ophthalmology;  Laterality: Right;  CDE: 15.41   CATARACT EXTRACTION W/PHACO Left 07/19/2016   Procedure: CATARACT EXTRACTION PHACO AND INTRAOCULAR LENS PLACEMENT LEFT EYE CDE= 12.65;  Surgeon: Tonny Branch, MD;  Location: [facility];  Service:  Ophthalmology;  Laterality: Left;  left   LUMBAR LAMINECTOMY/DECOMPRESSION MICRODISCECTOMY Left 09/27/2018   Procedure: L4-L5 Lumbar Laminectomy/Decompression;  Surgeon: Melina Schools, MD;  Location: [place];  Service: Orthopedics;  Laterality: Left;   ROBOTIC PELVIC AND PARA-AORTIC LYMPH NODE DISSECTION N/A 01/11/2017   Procedure: XI ROBOTIC BILATERAL PELVIC LYMPH NODE DISSECTION;  Surgeon: [name], MD;  Location: [facility];  Service: Gynecology;  Laterality: N/A;   SALPINGOOPHORECTOMY Right 11/09/2016   Procedure: RIGHT SALPINGO OOPHORECTOMY;  Surgeon: Jonnie Kind, MD;  Location: [facility];  Service: Gynecology;  Laterality: Right;   TUBAL LIGATION     VAGINAL HYSTERECTOMY N/A 11/09/2016   Procedure: HYSTERECTOMY VAGINAL;  Surgeon: Jonnie Kind, MD;  Location: [facility];  Service: Gynecology;  Laterality: N/A;    Current Outpatient Medications  Medication Sig Dispense Refill   alendronate (FOSAMAX) 70 MG tablet Take 1 tablet by mouth once a week.     ALPRAZolam (XANAX) 0.5 MG tablet Take 0.5 mg by mouth 2 (two) times daily as needed for anxiety.     amLODipine (NORVASC) 5 MG tablet Take 5 mg by mouth daily.      atorvastatin (LIPITOR) 40 MG tablet Take 40 mg by mouth at bedtime.      FLUoxetine (PROZAC) 10 MG capsule Take 10 mg by mouth daily.     lisinopril (PRINIVIL,ZESTRIL) 40 MG tablet Take 40 mg by mouth daily with breakfast.      traZODone (DESYREL) 100 MG tablet Take 1 tablet by mouth at bedtime.     varenicline (CHANTIX PAK) 0.5 MG X 11 & 1 MG X 42 tablet Take by mouth 2 (two) times daily. Take one 0.5 mg tablet by mouth once daily for 3 days, then increase to one 0.5 mg tablet twice daily for 4 days, then increase to one 1 mg tablet twice daily.     vitamin B-12 (CYANOCOBALAMIN) 500 MCG tablet Take 1,000 mcg by mouth daily.     citalopram (CELEXA) 40 MG tablet Take 40 mg by mouth daily.      No current facility-administered medications for this visit.      Allergies as of 01/29/2019 - Review Complete 01/29/2019  Allergen Reaction Noted   Zithromax [azithromycin] Rash and Other (See Comments) 01/06/2016   Prednisone Nausea Only 01/06/2016    Family History  Problem Relation Age of Onset   Hypertension Mother    Congenital heart disease Mother    Diabetes Mother    Thyroid disease Brother    Other Daughter        bowel issues   Colon cancer Neg Hx     Social History   Socioeconomic History   Marital status: Legally Separated    Spouse name: Not on file   Number of children: 1   Years of education: Not on file   Highest education level: Not on file  Occupational History   Not on file  Social Needs   Financial resource strain: Not on file   Food insecurity    Worry: Not on file    Inability: Not on file   Transportation needs  Medical: Not on file    Non-medical: Not on file  Tobacco Use   Smoking status: Current Every Day Smoker    Packs/day: 1.00    Years: 44.00    Pack years: 44.00    Types: Cigarettes   Smokeless tobacco: Never Used   Tobacco comment: smokes 3 cig daily  Substance and Sexual Activity   Alcohol use: No    Comment: 01-06-2016 Per pt rarely, 02-05-2016 per pt no but 36yrs ago     Drug use: No    Comment: 02-05-2016 per pt no but about 40 yrs ago   Sexual activity: Not Currently    Birth control/protection: Surgical    Comment: hyst  Lifestyle   Physical activity    Days per week: Not on file    Minutes per session: Not on file   Stress: Not on file  Relationships   Social connections    Talks on phone: Not on file    Gets together: Not on file    Attends religious service: Not on file    Active member of club or organization: Not on file    Attends meetings of clubs or organizations: Not on file    Relationship status: Not on file   Intimate partner violence    Fear of current or ex partner: Not on file    Emotionally abused: Not on file    Physically  abused: Not on file    Forced sexual activity: Not on file  Other Topics Concern   Not on file  Social History Narrative   Not on file    Review of Systems: General: Negative for anorexia, weight loss, fever, chills, fatigue, weakness. ENT: Negative for hoarseness, difficulty swallowing. CV: Negative for chest pain, angina, palpitations, peripheral edema.  Respiratory: Negative for dyspnea at rest, cough, sputum, wheezing.  GI: See history of present illness. MS: Negative for joint pain, low back pain.  Derm: Negative for rash or itching.  Endo: Negative for unusual weight change.  Heme: Negative for bruising or bleeding. Allergy: Negative for rash or hives.    Physical Exam: BP 101/67    Pulse 77    Temp (!) 96.9 F (36.1 C) (Temporal)    Ht 5\' 6"  (1.676 m)    Wt 129 lb 9.6 oz (58.8 kg)    BMI 20.92 kg/m  General:   Alert and oriented. Pleasant and cooperative. Well-nourished and well-developed.  Head:  Normocephalic and atraumatic. Eyes:  Without icterus, sclera clear and conjunctiva pink.  Ears:  Normal auditory acuity. Cardiovascular:  S1, S2 present without murmurs appreciated. Extremities without clubbing or edema. Respiratory:  Clear to auscultation bilaterally. No wheezes, rales, or rhonchi. No distress.  Gastrointestinal:  +BS, soft, non-tender and non-distended. No HSM noted. No guarding or rebound. No masses appreciated.  Rectal:  Deferred  Musculoskalatal:  Symmetrical without gross deformities. Neurologic:  Alert and oriented x4;  grossly normal neurologically. Psych:  Alert and cooperative. Normal mood and affect. Heme/Lymph/Immune: No excessive bruising noted.    01/29/2019 2:45 PM   Disclaimer: This note was dictated with voice recognition software. Similar sounding words can inadvertently be transcribed and may not be corrected upon review.

## 2019-01-30 ENCOUNTER — Other Ambulatory Visit: Payer: Self-pay

## 2019-01-30 DIAGNOSIS — Z1211 Encounter for screening for malignant neoplasm of colon: Secondary | ICD-10-CM

## 2019-01-30 MED ORDER — PEG 3350-KCL-NA BICARB-NACL 420 G PO SOLR: 4000.0000 mL | ORAL | 0 refills | Status: DC

## 2019-01-30 NOTE — Progress Notes (Signed)
cc'ed to pcp °

## 2019-03-14 DIAGNOSIS — M810 Age-related osteoporosis without current pathological fracture: Secondary | ICD-10-CM | POA: Diagnosis not present

## 2019-03-14 DIAGNOSIS — G4700 Insomnia, unspecified: Secondary | ICD-10-CM | POA: Diagnosis not present

## 2019-03-14 DIAGNOSIS — F321 Major depressive disorder, single episode, moderate: Secondary | ICD-10-CM | POA: Diagnosis not present

## 2019-03-14 DIAGNOSIS — Z23 Encounter for immunization: Secondary | ICD-10-CM | POA: Diagnosis not present

## 2019-03-14 DIAGNOSIS — F419 Anxiety disorder, unspecified: Secondary | ICD-10-CM | POA: Diagnosis not present

## 2019-03-14 DIAGNOSIS — F17210 Nicotine dependence, cigarettes, uncomplicated: Secondary | ICD-10-CM | POA: Diagnosis not present

## 2019-03-14 DIAGNOSIS — I10 Essential (primary) hypertension: Secondary | ICD-10-CM | POA: Diagnosis not present

## 2019-04-17 ENCOUNTER — Telehealth: Payer: Self-pay | Admitting: Internal Medicine

## 2019-04-17 NOTE — Telephone Encounter (Signed)
Called pt, she wants to cancel TCS for 04/26/19. States she has a bad cold. Advised her to call office if she decides to reschedule. Endo scheduler informed.  FYI to EG.

## 2019-04-17 NOTE — Telephone Encounter (Signed)
[phone] patient called and said she wants to cancel her procedure

## 2019-04-17 NOTE — Telephone Encounter (Signed)
Noted, no further recommendations at this time. 

## 2019-04-24 ENCOUNTER — Encounter (HOSPITAL_COMMUNITY): Payer: PPO

## 2019-04-24 ENCOUNTER — Other Ambulatory Visit (HOSPITAL_COMMUNITY): Payer: PPO

## 2019-04-26 ENCOUNTER — Encounter (HOSPITAL_COMMUNITY): Payer: Self-pay

## 2019-04-26 ENCOUNTER — Ambulatory Visit (HOSPITAL_COMMUNITY): Admit: 2019-04-26 | Payer: PPO | Admitting: Internal Medicine

## 2019-04-26 SURGERY — COLONOSCOPY WITH PROPOFOL
Anesthesia: Monitor Anesthesia Care

## 2019-06-04 ENCOUNTER — Ambulatory Visit: Payer: PPO | Admitting: Nurse Practitioner

## 2019-06-04 ENCOUNTER — Encounter: Payer: Self-pay | Admitting: Internal Medicine

## 2019-06-04 ENCOUNTER — Telehealth: Payer: Self-pay | Admitting: Internal Medicine

## 2019-06-04 NOTE — Telephone Encounter (Signed)
PATIENT WAS A NO SHOW AND LETTER SENT  °

## 2019-06-04 NOTE — Progress Notes (Deleted)
Referring Provider: [name], MD Primary Care Physician:  [name], MD Primary GI:  Dr. Gala Romney  No chief complaint on file.   HPI:   [name] is a 64 y.o. female who presents for follow-up on constipation.  Patient was last seen in our office 01/29/2019 for the same as well as to schedule colonoscopy.  At her last visit she noted she is never had a colonoscopy.  Intermittent left lower quadrant discomfort with "little strings" when she has a bowel movement for which she was advised MiraLAX but that caused loose stools.  Has a bowel movement about every 1 to 2 weeks.  Abdominal pain typically resolves after bowel movement stools irregular since radiation 2 years prior.  Uses Metamucil and over-the-counter laxatives, but still has straining.  Stools are "pencil sized".  No hematochezia or other red flag signs.  Also with vaginal pressure and was told this is likely due to constipation.  No other overt GI complaints.  Recommended colonoscopy, abdominal x-ray, continue MiraLAX for now, follow-up in 4 months.  Abdominal x-ray completed 01/29/2019 which found mild constipation without obstruction.  The patient's colonoscopy was initially scheduled for 04/26/2019 but she called to cancel due to having a cold.  It has not been rescheduled as of yet.  Today she states   Past Medical History:  Diagnosis Date  . Anxiety   . Arthritis    spinal stenosis  . Back disorder    "crooked spine"  . Bulging lumbar disc   . Cervical cancer ([place])   . Complication of anesthesia    ? hypotension 10/2016 at [facility] surgery   . Depression   . H/O degenerative disc disease   . History of radiation therapy 02/22/17-03/22/17   vaginal cuff 30 Gy in 5 fractions  . Hypertension   . Hypothyroidism    patient taken off of hypothyroid med in 11/2016   . Osteoporosis   . Thyroid disease     Past Surgical History:  Procedure Laterality Date  . ABDOMINAL EXPOSURE N/A 09/27/2018   Procedure:  ABDOMINAL EXPOSURE;  Surgeon: Serafina Mitchell, MD;  Location: [facility] OR;  Service: Vascular;  Laterality: N/A;  . ANTERIOR AND POSTERIOR REPAIR N/A 11/09/2016   Procedure: ANTERIOR (CYSTOCELE) AND POSTERIOR REPAIR (RECTOCELE);  Surgeon: Jonnie Kind, MD;  [facility]: [facility];  Service: Gynecology;  Laterality: N/A;  . ANTERIOR LUMBAR FUSION Left 09/27/2018   Procedure: ANTERIOR LUMBAR FUSION L5-S1,;  Surgeon: Melina Schools, MD;  Location: [place];  Service: Orthopedics;  Laterality: Left;  4.5 HRS/ DR. Trula Slade ASSIST  . BILATERAL SALPINGECTOMY Left 11/09/2016   Procedure: LEFT SALPINGECTOMY;  Surgeon: Jonnie Kind, MD;  [facility]: [facility];  Service: Gynecology;  Laterality: Left;  . CATARACT EXTRACTION W/PHACO Right 07/05/2016   Procedure: CATARACT EXTRACTION PHACO AND INTRAOCULAR LENS PLACEMENT (IOC);  Surgeon: Tonny Branch, MD;  [facility]: [facility];  Service: Ophthalmology;  Laterality: Right;  CDE: 15.41  . CATARACT EXTRACTION W/PHACO Left 07/19/2016   Procedure: CATARACT EXTRACTION PHACO AND INTRAOCULAR LENS PLACEMENT LEFT EYE CDE= 12.65;  Surgeon: Tonny Branch, MD;  [facility]: [facility];  Service: Ophthalmology;  Laterality: Left;  left  . LUMBAR LAMINECTOMY/DECOMPRESSION MICRODISCECTOMY Left 09/27/2018   Procedure: L4-L5 Lumbar Laminectomy/Decompression;  Surgeon: Melina Schools, MD;  Location: [place];  Service: Orthopedics;  Laterality: Left;  . ROBOTIC PELVIC AND PARA-AORTIC LYMPH NODE DISSECTION N/A 01/11/2017   Procedure: XI ROBOTIC BILATERAL PELVIC LYMPH NODE DISSECTION;  Surgeon: [name], MD;  [facility]: [facility];  Service: Gynecology;  Laterality: N/A;  . SALPINGOOPHORECTOMY Right 11/09/2016   Procedure: RIGHT SALPINGO OOPHORECTOMY;  Surgeon: Jonnie Kind, MD;  [facility]: [facility];  Service: Gynecology;  Laterality: Right;  . TUBAL LIGATION    . VAGINAL HYSTERECTOMY N/A 11/09/2016   Procedure: HYSTERECTOMY VAGINAL;  Surgeon: Jonnie Kind, MD;  [facility]: [facility];  Service: Gynecology;  Laterality: N/A;      Current Outpatient Medications  Medication Sig Dispense Refill  . alendronate (FOSAMAX) 70 MG tablet Take 1 tablet by mouth once a week.    . ALPRAZolam (XANAX) 0.5 MG tablet Take 0.5 mg by mouth 2 (two) times daily as needed for anxiety.    Marland Kitchen amLODipine (NORVASC) 5 MG tablet Take 5 mg by mouth daily.     Marland Kitchen atorvastatin (LIPITOR) 40 MG tablet Take 40 mg by mouth at bedtime.     . citalopram (CELEXA) 40 MG tablet Take 40 mg by mouth daily.     Marland Kitchen FLUoxetine (PROZAC) 10 MG capsule Take 10 mg by mouth daily.    Marland Kitchen lisinopril (PRINIVIL,ZESTRIL) 40 MG tablet Take 40 mg by mouth daily with breakfast.     . polyethylene glycol-electrolytes (TRILYTE) 420 g solution Take 4,000 mLs by mouth as directed. 4000 mL 0  . traZODone (DESYREL) 100 MG tablet Take 1 tablet by mouth at bedtime.    . varenicline (CHANTIX PAK) 0.5 MG X 11 & 1 MG X 42 tablet Take by mouth 2 (two) times daily. Take one 0.5 mg tablet by mouth once daily for 3 days, then increase to one 0.5 mg tablet twice daily for 4 days, then increase to one 1 mg tablet twice daily.    . vitamin B-12 (CYANOCOBALAMIN) 500 MCG tablet Take 1,000 mcg by mouth daily.     No current facility-administered medications for this visit.    Allergies as of 06/04/2019 - Review Complete 01/29/2019  Allergen Reaction Noted  . Zithromax [azithromycin] Rash and Other (See Comments) 01/06/2016  . Prednisone Nausea Only 01/06/2016    Family History  Problem Relation Age of Onset  . Hypertension Mother   . Congenital heart disease Mother   . Diabetes Mother   . Thyroid disease Brother   . Other Daughter        bowel issues  . Colon cancer Neg Hx     Social History   Socioeconomic History  . Marital status: Legally Separated    Spouse name: Not on file  . Number of children: 1  . Years of education: Not on file  . Highest education level: Not on file  Occupational History  . Not on file  Tobacco Use  . Smoking status: Current Every Day Smoker     Packs/day: 1.00    Years: 44.00    Pack years: 44.00    Types: Cigarettes  . Smokeless tobacco: Never Used  . Tobacco comment: smokes 3 cig daily  Substance and Sexual Activity  . Alcohol use: No    Comment: 01-06-2016 Per pt rarely, 02-05-2016 per pt no but 54yrs ago    . Drug use: No    Comment: 02-05-2016 per pt no but about 40 yrs ago  . Sexual activity: Not Currently    Birth control/protection: Surgical    Comment: hyst  Other Topics Concern  . Not on file  Social History Narrative  . Not on file   Social Determinants of Health   Financial Resource Strain:   . Difficulty of  Paying Living Expenses: Not on file  Food Insecurity:   . Worried About Charity fundraiser in the Last Year: Not on file  . Ran Out of Food in the Last Year: Not on file  Transportation Needs:   . Lack of Transportation (Medical): Not on file  . Lack of Transportation (Non-Medical): Not on file  Physical Activity:   . Days of Exercise per Week: Not on file  . Minutes of Exercise per Session: Not on file  Stress:   . Feeling of Stress : Not on file  Social Connections:   . Frequency of Communication with Friends and Family: Not on file  . Frequency of Social Gatherings with Friends and Family: Not on file  . Attends Religious Services: Not on file  . Active Member of Clubs or Organizations: Not on file  . Attends Archivist Meetings: Not on file  . Marital Status: Not on file    Review of Systems: General: Negative for anorexia, weight loss, fever, chills, fatigue, weakness. Eyes: Negative for vision changes.  ENT: Negative for hoarseness, difficulty swallowing , nasal congestion. CV: Negative for chest pain, angina, palpitations, dyspnea on exertion, peripheral edema.  Respiratory: Negative for dyspnea at rest, dyspnea on exertion, cough, sputum, wheezing.  GI: See history of present illness. GU:  Negative for dysuria, hematuria, urinary incontinence, urinary frequency, nocturnal  urination.  MS: Negative for joint pain, low back pain.  Derm: Negative for rash or itching.  Neuro: Negative for weakness, abnormal sensation, seizure, frequent headaches, memory loss, confusion.  Psych: Negative for anxiety, depression, suicidal ideation, hallucinations.  Endo: Negative for unusual weight change.  Heme: Negative for bruising or bleeding. Allergy: Negative for rash or hives.   Physical Exam: There were no vitals taken for this visit. General:   Alert and oriented. Pleasant and cooperative. Well-nourished and well-developed.  Head:  Normocephalic and atraumatic. Eyes:  Without icterus, sclera clear and conjunctiva pink.  Ears:  Normal auditory acuity. Mouth:  No deformity or lesions, oral mucosa pink.  Throat/Neck:  Supple, without mass or thyromegaly. Cardiovascular:  S1, S2 present without murmurs appreciated. Normal pulses noted. Extremities without clubbing or edema. Respiratory:  Clear to auscultation bilaterally. No wheezes, rales, or rhonchi. No distress.  Gastrointestinal:  +BS, soft, non-tender and non-distended. No HSM noted. No guarding or rebound. No masses appreciated.  Rectal:  Deferred  Musculoskalatal:  Symmetrical without gross deformities. Normal posture. Skin:  Intact without significant lesions or rashes. Neurologic:  Alert and oriented x4;  grossly normal neurologically. Psych:  Alert and cooperative. Normal mood and affect. Heme/Lymph/Immune: No significant cervical adenopathy. No excessive bruising noted.    06/04/2019 12:30 PM   Disclaimer: This note was dictated with voice recognition software. Similar sounding words can inadvertently be transcribed and may not be corrected upon review.

## 2019-06-06 DIAGNOSIS — F419 Anxiety disorder, unspecified: Secondary | ICD-10-CM | POA: Diagnosis not present

## 2019-06-14 DIAGNOSIS — G4700 Insomnia, unspecified: Secondary | ICD-10-CM | POA: Diagnosis not present

## 2019-06-14 DIAGNOSIS — I10 Essential (primary) hypertension: Secondary | ICD-10-CM | POA: Diagnosis not present

## 2019-06-14 DIAGNOSIS — F419 Anxiety disorder, unspecified: Secondary | ICD-10-CM | POA: Diagnosis not present

## 2019-06-14 DIAGNOSIS — F331 Major depressive disorder, recurrent, moderate: Secondary | ICD-10-CM | POA: Diagnosis not present

## 2019-09-13 DIAGNOSIS — M810 Age-related osteoporosis without current pathological fracture: Secondary | ICD-10-CM | POA: Diagnosis not present

## 2019-09-13 DIAGNOSIS — I10 Essential (primary) hypertension: Secondary | ICD-10-CM | POA: Diagnosis not present

## 2019-09-13 DIAGNOSIS — Z0000 Encounter for general adult medical examination without abnormal findings: Secondary | ICD-10-CM | POA: Diagnosis not present

## 2019-09-13 DIAGNOSIS — M5126 Other intervertebral disc displacement, lumbar region: Secondary | ICD-10-CM | POA: Diagnosis not present

## 2019-09-13 DIAGNOSIS — F331 Major depressive disorder, recurrent, moderate: Secondary | ICD-10-CM | POA: Diagnosis not present

## 2019-09-13 DIAGNOSIS — E785 Hyperlipidemia, unspecified: Secondary | ICD-10-CM | POA: Diagnosis not present

## 2019-09-13 DIAGNOSIS — E559 Vitamin D deficiency, unspecified: Secondary | ICD-10-CM | POA: Diagnosis not present

## 2019-10-04 DIAGNOSIS — I10 Essential (primary) hypertension: Secondary | ICD-10-CM | POA: Diagnosis not present

## 2019-10-04 DIAGNOSIS — E559 Vitamin D deficiency, unspecified: Secondary | ICD-10-CM | POA: Diagnosis not present

## 2019-10-04 DIAGNOSIS — E785 Hyperlipidemia, unspecified: Secondary | ICD-10-CM | POA: Diagnosis not present

## 2019-10-11 DIAGNOSIS — I10 Essential (primary) hypertension: Secondary | ICD-10-CM | POA: Diagnosis not present

## 2019-10-11 DIAGNOSIS — M810 Age-related osteoporosis without current pathological fracture: Secondary | ICD-10-CM | POA: Diagnosis not present

## 2019-10-11 DIAGNOSIS — F331 Major depressive disorder, recurrent, moderate: Secondary | ICD-10-CM | POA: Diagnosis not present

## 2019-10-11 DIAGNOSIS — F419 Anxiety disorder, unspecified: Secondary | ICD-10-CM | POA: Diagnosis not present

## 2019-10-21 ENCOUNTER — Other Ambulatory Visit: Payer: Self-pay

## 2019-10-21 ENCOUNTER — Emergency Department (HOSPITAL_COMMUNITY)
Admission: EM | Admit: 2019-10-21 | Discharge: 2019-10-21 | Disposition: A | Discharge status: Home / Self Care | Payer: PPO | Attending: Emergency Medicine | Admitting: Emergency Medicine

## 2019-10-21 ENCOUNTER — Emergency Department (HOSPITAL_COMMUNITY): Payer: PPO

## 2019-10-21 ENCOUNTER — Encounter (HOSPITAL_COMMUNITY): Payer: Self-pay | Admitting: Emergency Medicine

## 2019-10-21 DIAGNOSIS — Y9389 Activity, other specified: Secondary | ICD-10-CM | POA: Insufficient documentation

## 2019-10-21 DIAGNOSIS — I10 Essential (primary) hypertension: Secondary | ICD-10-CM | POA: Insufficient documentation

## 2019-10-21 DIAGNOSIS — F17210 Nicotine dependence, cigarettes, uncomplicated: Secondary | ICD-10-CM | POA: Insufficient documentation

## 2019-10-21 DIAGNOSIS — Z79899 Other long term (current) drug therapy: Secondary | ICD-10-CM | POA: Diagnosis not present

## 2019-10-21 DIAGNOSIS — S51812A Laceration without foreign body of left forearm, initial encounter: Secondary | ICD-10-CM | POA: Diagnosis not present

## 2019-10-21 DIAGNOSIS — Y9289 Other specified places as the place of occurrence of the external cause: Secondary | ICD-10-CM | POA: Insufficient documentation

## 2019-10-21 DIAGNOSIS — S51852A Open bite of left forearm, initial encounter: Secondary | ICD-10-CM | POA: Diagnosis not present

## 2019-10-21 DIAGNOSIS — W540XXA Bitten by dog, initial encounter: Secondary | ICD-10-CM | POA: Insufficient documentation

## 2019-10-21 DIAGNOSIS — Z8541 Personal history of malignant neoplasm of cervix uteri: Secondary | ICD-10-CM | POA: Insufficient documentation

## 2019-10-21 DIAGNOSIS — Z203 Contact with and (suspected) exposure to rabies: Secondary | ICD-10-CM | POA: Insufficient documentation

## 2019-10-21 DIAGNOSIS — Z2914 Encounter for prophylactic rabies immune globin: Secondary | ICD-10-CM | POA: Diagnosis not present

## 2019-10-21 DIAGNOSIS — Y998 Other external cause status: Secondary | ICD-10-CM | POA: Diagnosis not present

## 2019-10-21 DIAGNOSIS — E039 Hypothyroidism, unspecified: Secondary | ICD-10-CM | POA: Insufficient documentation

## 2019-10-21 MED ORDER — RABIES IMMUNE GLOBULIN 150 UNIT/ML IM INJ
20.0000 [IU]/kg | INJECTION | Freq: Once | INTRAMUSCULAR | Status: AC
  Administered 2019-10-21: 1125 [IU] via INTRAMUSCULAR
  Filled 2019-10-21: qty 16
  Filled 2019-10-21: qty 8

## 2019-10-21 MED ORDER — POVIDONE-IODINE 10 % EX SOLN
CUTANEOUS | Status: DC | PRN
  Administered 2019-10-21: 1 via TOPICAL
  Filled 2019-10-21: qty 15

## 2019-10-21 MED ORDER — AMOXICILLIN-POT CLAVULANATE 875-125 MG PO TABS
1.0000 | ORAL_TABLET | Freq: Once | ORAL | Status: AC
  Administered 2019-10-21: 1 via ORAL
  Filled 2019-10-21: qty 1

## 2019-10-21 MED ORDER — HYDROCODONE-ACETAMINOPHEN 5-325 MG PO TABS: 2.0000 | ORAL_TABLET | ORAL | 0 refills | Status: DC | PRN

## 2019-10-21 MED ORDER — AMOXICILLIN-POT CLAVULANATE 875-125 MG PO TABS
1.0000 | ORAL_TABLET | Freq: Two times a day (BID) | ORAL | 0 refills | Status: DC
Start: 2019-10-21 — End: 2020-01-17

## 2019-10-21 MED ORDER — TETANUS-DIPHTH-ACELL PERTUSSIS 5-2.5-18.5 LF-MCG/0.5 IM SUSP
0.5000 mL | Freq: Once | INTRAMUSCULAR | Status: AC
  Administered 2019-10-21: 0.5 mL via INTRAMUSCULAR
  Filled 2019-10-21: qty 0.5

## 2019-10-21 MED ORDER — LIDOCAINE-EPINEPHRINE (PF) 2 %-1:200000 IJ SOLN
10.0000 mL | Freq: Once | INTRAMUSCULAR | Status: AC
  Administered 2019-10-21: 10 mL via INTRADERMAL
  Filled 2019-10-21: qty 10

## 2019-10-21 MED ORDER — RABIES VACCINE, PCEC IM SUSR
1.0000 mL | Freq: Once | INTRAMUSCULAR | Status: AC
  Administered 2019-10-21: 1 mL via INTRAMUSCULAR
  Filled 2019-10-21: qty 1

## 2019-10-21 NOTE — Discharge Instructions (Addendum)
Keep the wounds clean with mild soap and water and keep them bandaged.  Elevate your forearm and apply ice packs on and off to help reduce swelling.  Take the antibiotic as directed.  Your wounds will need to be rechecked in 2 to 3 days.  Your next rabies vaccine can be given at the [facility] urgent care.  You will be given a schedule when your next dose is due.  Return to the emergency department for any signs of infection such as increasing pain, swelling of your arm, red streaking fever or chills.

## 2019-10-21 NOTE — ED Triage Notes (Signed)
Patient c/o dog bite to left forearm. Per patient it was her daughters chocolate lab. Animal control notified and went to house. Patient unsure if dogs vaccinations up-to-date. Multiple wounds noted to forearm.

## 2019-10-21 NOTE — ED Notes (Signed)
Dressing applied with gauze

## 2019-10-22 MED FILL — Hydrocodone-Acetaminophen Tab 5-325 MG: ORAL | Qty: 6 | Status: AC

## 2019-10-24 ENCOUNTER — Other Ambulatory Visit: Payer: Self-pay

## 2019-10-24 ENCOUNTER — Ambulatory Visit
Admission: EM | Admit: 2019-10-24 | Discharge: 2019-10-24 | Disposition: A | Discharge status: Home / Self Care | Payer: PPO | Attending: Emergency Medicine | Admitting: Emergency Medicine

## 2019-10-24 DIAGNOSIS — S51852A Open bite of left forearm, initial encounter: Secondary | ICD-10-CM | POA: Diagnosis not present

## 2019-10-24 DIAGNOSIS — W540XXD Bitten by dog, subsequent encounter: Secondary | ICD-10-CM

## 2019-10-24 DIAGNOSIS — Z203 Contact with and (suspected) exposure to rabies: Secondary | ICD-10-CM | POA: Diagnosis not present

## 2019-10-24 DIAGNOSIS — Z23 Encounter for immunization: Secondary | ICD-10-CM | POA: Diagnosis not present

## 2019-10-24 DIAGNOSIS — Z5189 Encounter for other specified aftercare: Secondary | ICD-10-CM

## 2019-10-24 MED ORDER — RABIES VACCINE, PCEC IM SUSR
1.0000 mL | Freq: Once | INTRAMUSCULAR | Status: AC
  Administered 2019-10-24: 1 mL via INTRAMUSCULAR

## 2019-10-24 MED ORDER — CEFTRIAXONE SODIUM 1 G IJ SOLR
1.0000 g | Freq: Once | INTRAMUSCULAR | Status: AC
  Administered 2019-10-24: 1 g via INTRAMUSCULAR

## 2019-10-24 NOTE — ED Triage Notes (Addendum)
Pt presents to have day 3 rabies injection.  Pt is also requesting to see provider to see her wound. Pt has a dog bite that was treated in the emergency room on her left fore arm. Patient is concerned about the amount of pain and swelling she is experiencing. Pt is taking hydrocodone and doxycycline at home. Reports she has not taken much of the pain medication.

## 2019-10-24 NOTE — Discharge Instructions (Addendum)
Unable to rule out worsening infection in urgent care setting.  Offered patient further evaluation and management in the ED.  Patient declines at this time and would like to try outpatient therapy first.  Aware of the risk associated with this decision including missed diagnosis, organ damage, organ failure, and/or death.  Patient aware and in agreement.     1 gram of rocephin given in office Dressing applied Keep clean Keep covered Continue with antibiotic as prescribed Alternate ibuprofen and/or tylenol Use pain medication as prescribed for severe break-through pain.  Return or go to the ED if you have any new or worsening symptoms such as increased pain, redness, swelling, drainage, discharge, decreased range of motion of extremity, etc..

## 2019-10-24 NOTE — ED Provider Notes (Signed)
[place]   [id number] 10/24/19 Arrival Time: 1207  CC: Wound check   SUBJECTIVE:  [name] is a 64 y.o. female who presents with wound check.  Dog bite to LT forearm x 3 days ago.  Was seen in the ED and treated with augmentin.  Also updated tetanus and rabies started.  Reports pain, redness and swelling to LT elbow.  Denies fever, chills, nausea, vomiting, purulent drainage, changes in sensation.   ROS: As per HPI.  All other pertinent ROS negative.     Past Medical History:  Diagnosis Date  . Anxiety   . Arthritis    spinal stenosis  . Back disorder    "crooked spine"  . Bulging lumbar disc   . Cervical cancer ([place])   . Complication of anesthesia    ? hypotension 10/2016 at [facility] surgery   . Depression   . H/O degenerative disc disease   . History of radiation therapy 02/22/17-03/22/17   vaginal cuff 30 Gy in 5 fractions  . Hypertension   . Hypothyroidism    patient taken off of hypothyroid med in 11/2016   . Osteoporosis   . Thyroid disease    Past Surgical History:  Procedure Laterality Date  . ABDOMINAL EXPOSURE N/A 09/27/2018   Procedure: ABDOMINAL EXPOSURE;  Surgeon: Serafina Mitchell, MD;  Location: [facility] OR;  Service: Vascular;  Laterality: N/A;  . ANTERIOR AND POSTERIOR REPAIR N/A 11/09/2016   Procedure: ANTERIOR (CYSTOCELE) AND POSTERIOR REPAIR (RECTOCELE);  Surgeon: Jonnie Kind, MD;  Location: [facility];  Service: Gynecology;  Laterality: N/A;  . ANTERIOR LUMBAR FUSION Left 09/27/2018   Procedure: ANTERIOR LUMBAR FUSION L5-S1,;  Surgeon: Melina Schools, MD;  Location: [place];  Service: Orthopedics;  Laterality: Left;  4.5 HRS/ DR. Trula Slade ASSIST  . BILATERAL SALPINGECTOMY Left 11/09/2016   Procedure: LEFT SALPINGECTOMY;  Surgeon: Jonnie Kind, MD;  Location: [facility];  Service: Gynecology;  Laterality: Left;  . CATARACT EXTRACTION W/PHACO Right 07/05/2016   Procedure: CATARACT EXTRACTION PHACO AND INTRAOCULAR LENS PLACEMENT (IOC);  Surgeon:  Tonny Branch, MD;  Location: [facility];  Service: Ophthalmology;  Laterality: Right;  CDE: 15.41  . CATARACT EXTRACTION W/PHACO Left 07/19/2016   Procedure: CATARACT EXTRACTION PHACO AND INTRAOCULAR LENS PLACEMENT LEFT EYE CDE= 12.65;  Surgeon: Tonny Branch, MD;  Location: [facility];  Service: Ophthalmology;  Laterality: Left;  left  . LUMBAR LAMINECTOMY/DECOMPRESSION MICRODISCECTOMY Left 09/27/2018   Procedure: L4-L5 Lumbar Laminectomy/Decompression;  Surgeon: Melina Schools, MD;  Location: [place];  Service: Orthopedics;  Laterality: Left;  . ROBOTIC PELVIC AND PARA-AORTIC LYMPH NODE DISSECTION N/A 01/11/2017   Procedure: XI ROBOTIC BILATERAL PELVIC LYMPH NODE DISSECTION;  Surgeon: [name], MD;  Location: [facility];  Service: Gynecology;  Laterality: N/A;  . SALPINGOOPHORECTOMY Right 11/09/2016   Procedure: RIGHT SALPINGO OOPHORECTOMY;  Surgeon: Jonnie Kind, MD;  Location: [facility];  Service: Gynecology;  Laterality: Right;  . TUBAL LIGATION    . VAGINAL HYSTERECTOMY N/A 11/09/2016   Procedure: HYSTERECTOMY VAGINAL;  Surgeon: Jonnie Kind, MD;  Location: [facility];  Service: Gynecology;  Laterality: N/A;   Allergies  Allergen Reactions  . Zithromax [Azithromycin] Rash and Other (See Comments)    Blisters in mouth   . Prednisone Nausea Only    Sick to stomach   No current facility-administered medications on file prior to encounter.   Current Outpatient Medications on File Prior to Encounter  Medication Sig Dispense Refill  . alendronate (FOSAMAX) 70 MG tablet  Take 1 tablet by mouth once a week.    . ALPRAZolam (XANAX) 1 MG tablet Take 1 mg by mouth 2 (two) times daily.    Marland Kitchen amLODipine (NORVASC) 5 MG tablet Take 5 mg by mouth daily.     Marland Kitchen amoxicillin-clavulanate (AUGMENTIN) 875-125 MG tablet Take 1 tablet by mouth 2 (two) times daily. 14 tablet 0  . atorvastatin (LIPITOR) 40 MG tablet Take 40 mg by mouth at bedtime.     . CHANTIX 1 MG tablet Take 1 mg by mouth 2 (two) times daily.    Marland Kitchen FLUoxetine  (PROZAC) 20 MG capsule Take 1 capsule by mouth daily. Take with 40mg  cap    . FLUoxetine (PROZAC) 40 MG capsule Take 40 mg by mouth daily. Take with 20mg  cap    . HYDROcodone-acetaminophen (NORCO/VICODIN) 5-325 MG tablet Take 2 tablets by mouth every 4 (four) hours as needed. 6 tablet 0  . lisinopril (PRINIVIL,ZESTRIL) 40 MG tablet Take 40 mg by mouth daily with breakfast.     . vitamin B-12 (CYANOCOBALAMIN) 500 MCG tablet Take 1,000 mcg by mouth daily.     Social History   Socioeconomic History  . Marital status: Legally Separated    Spouse name: Not on file  . Number of children: 1  . Years of education: Not on file  . Highest education level: Not on file  Occupational History  . Not on file  Tobacco Use  . Smoking status: Current Every Day Smoker    Packs/day: 1.00    Years: 44.00    Pack years: 44.00    Types: Cigarettes  . Smokeless tobacco: Never Used  . Tobacco comment: smokes 3 cig daily  Vaping Use  . Vaping Use: Never used  Substance and Sexual Activity  . Alcohol use: No    Comment: 01-06-2016 Per pt rarely, 02-05-2016 per pt no but 27yrs ago    . Drug use: No    Comment: 02-05-2016 per pt no but about 40 yrs ago  . Sexual activity: Not Currently    Birth control/protection: Surgical    Comment: hyst  Other Topics Concern  . Not on file  Social History Narrative  . Not on file   Social Determinants of Health   Financial Resource Strain:   . Difficulty of Paying Living Expenses:   Food Insecurity:   . Worried About Charity fundraiser in the Last Year:   . Arboriculturist in the Last Year:   Transportation Needs:   . Film/video editor (Medical):   Marland Kitchen Lack of Transportation (Non-Medical):   Physical Activity:   . Days of Exercise per Week:   . Minutes of Exercise per Session:   Stress:   . Feeling of Stress :   Social Connections:   . Frequency of Communication with Friends and Family:   . Frequency of Social Gatherings with Friends and Family:   .  Attends Religious Services:   . Active Member of Clubs or Organizations:   . Attends Archivist Meetings:   Marland Kitchen Marital Status:   Intimate Partner Violence:   . Fear of Current or Ex-Partner:   . Emotionally Abused:   Marland Kitchen Physically Abused:   . Sexually Abused:    Family History  Problem Relation Age of Onset  . Hypertension Mother   . Congenital heart disease Mother   . Diabetes Mother   . Thyroid disease Brother   . Other Daughter  bowel issues  . Colon cancer Neg Hx      OBJECTIVE:  Vitals:   10/24/19 1239  BP: (!) 146/85  Pulse: 83  Resp: 17  Temp: 98.4 F (36.9 C)  TempSrc: Oral  SpO2: 92%     General appearance: alert; no distress CV: Radial pulse 2+ Skin: multiple lacerations to LT forearm, sutures present, some bleeding; 5 x 5 cm area of erythema and swelling to medial elbow, TTP, no obvious drainage or bleeding to elbow Elbow: LROM about the elbow, no bony tenderness Psychological: alert and cooperative; normal mood and affect  ASSESSMENT & PLAN:  1. Rabies contact   2. Dog bite of left forearm, subsequent encounter   3. Visit for wound check     Meds ordered this encounter  Medications  . rabies vaccine (RABAVERT) injection 1 mL  . cefTRIAXone (ROCEPHIN) injection 1 g   Unable to rule out worsening infection in urgent care setting.  Offered patient further evaluation and management in the ED.  Patient declines at this time and would like to try outpatient therapy first.  Aware of the risk associated with this decision including missed diagnosis, organ damage, organ failure, and/or death.  Patient aware and in agreement.     1 gram of rocephin given in office Dressing applied Keep clean Keep covered Continue with antibiotic as prescribed Alternate ibuprofen and/or tylenol Use pain medication as prescribed for severe break-through pain.  Return or go to the ED if you have any new or worsening symptoms such as increased pain, redness,  swelling, drainage, discharge, decreased range of motion of extremity, etc..   Reviewed expectations re: course of current medical issues. Questions answered. Outlined signs and symptoms indicating need for more acute intervention. Patient verbalized understanding. After Visit Summary given.   Lestine Box, PA-C 10/24/19 1306

## 2019-10-24 NOTE — ED Provider Notes (Signed)
[facility] EMERGENCY DEPARTMENT Provider Note   CSN: [id number] Arrival date & time: 10/21/19  1453     History Chief Complaint  Patient presents with  . Animal Bite    [name] is a 64 y.o. female.  HPI      [name] is a 64 y.o. female who presents to the Emergency Department complaining of dog bite of the left forearm.  Incident occurred shortly before ER arrival.  She states she was bitten by her daughters dog.  Incident occurred in [place].  Stage manager was notified and report report filed.  Patient unsure of dogs rabies vaccinations.  Last tetanus unknown.  She complains of pain with movement of her left hand or fingers.  She denies swelling, numbness or weakness.  She does not take blood thinners.  Past Medical History:  Diagnosis Date  . Anxiety   . Arthritis    spinal stenosis  . Back disorder    "crooked spine"  . Bulging lumbar disc   . Cervical cancer ([place])   . Complication of anesthesia    ? hypotension 10/2016 at [facility] surgery   . Depression   . H/O degenerative disc disease   . History of radiation therapy 02/22/17-03/22/17   vaginal cuff 30 Gy in 5 fractions  . Hypertension   . Hypothyroidism    patient taken off of hypothyroid med in 11/2016   . Osteoporosis   . Thyroid disease     Patient Active Problem List   Diagnosis Date Noted  . Constipation 01/29/2019  . Preventative health care 01/29/2019  . Vaginal pain 12/12/2018  . S/P lumbar fusion 09/27/2018  . Degeneration of lumbar intervertebral disc 04/19/2017  . Major depressive disorder, single episode, unspecified 03/02/2017  . 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 Hyperthyroidism 11/19/2016  . S/P vaginal hysterectomy with anterior, and posterior repair, right salpingo-oophorectomy and left salpingectomy 11/09/2016  . Status post vaginal hysterectomy 11/09/2016  . Moderate episode of recurrent major depressive disorder ([place]) 01/06/2016     Past Surgical History:  Procedure Laterality Date  . ABDOMINAL EXPOSURE N/A 09/27/2018   Procedure: ABDOMINAL EXPOSURE;  Surgeon: Serafina Mitchell, MD;  Location: [facility] OR;  Service: Vascular;  Laterality: N/A;  . ANTERIOR AND POSTERIOR REPAIR N/A 11/09/2016   Procedure: ANTERIOR (CYSTOCELE) AND POSTERIOR REPAIR (RECTOCELE);  Surgeon: Jonnie Kind, MD;  Location: [facility];  Service: Gynecology;  Laterality: N/A;  . ANTERIOR LUMBAR FUSION Left 09/27/2018   Procedure: ANTERIOR LUMBAR FUSION L5-S1,;  Surgeon: Melina Schools, MD;  Location: [place];  Service: Orthopedics;  Laterality: Left;  4.5 HRS/ DR. Trula Slade ASSIST  . BILATERAL SALPINGECTOMY Left 11/09/2016   Procedure: LEFT SALPINGECTOMY;  Surgeon: Jonnie Kind, MD;  Location: [facility];  Service: Gynecology;  Laterality: Left;  . CATARACT EXTRACTION W/PHACO Right 07/05/2016   Procedure: CATARACT EXTRACTION PHACO AND INTRAOCULAR LENS PLACEMENT (IOC);  Surgeon: Tonny Branch, MD;  Location: [facility];  Service: Ophthalmology;  Laterality: Right;  CDE: 15.41  . CATARACT EXTRACTION W/PHACO Left 07/19/2016   Procedure: CATARACT EXTRACTION PHACO AND INTRAOCULAR LENS PLACEMENT LEFT EYE CDE= 12.65;  Surgeon: Tonny Branch, MD;  Location: [facility];  Service: Ophthalmology;  Laterality: Left;  left  . LUMBAR LAMINECTOMY/DECOMPRESSION MICRODISCECTOMY Left 09/27/2018   Procedure: L4-L5 Lumbar Laminectomy/Decompression;  Surgeon: Melina Schools, MD;  Location: [place];  Service: Orthopedics;  Laterality: Left;  . ROBOTIC PELVIC AND PARA-AORTIC LYMPH NODE DISSECTION N/A 01/11/2017   Procedure: XI  ROBOTIC BILATERAL PELVIC LYMPH NODE DISSECTION;  Surgeon: [name], MD;  [facility];  Service: Gynecology;  Laterality: N/A;  . SALPINGOOPHORECTOMY Right 11/09/2016   Procedure: RIGHT SALPINGO OOPHORECTOMY;  Surgeon: Jonnie Kind, MD;  Location: [facility];  Service: Gynecology;  Laterality: Right;  . TUBAL LIGATION    . VAGINAL HYSTERECTOMY N/A 11/09/2016   Procedure:  HYSTERECTOMY VAGINAL;  Surgeon: Jonnie Kind, MD;  [facility]: [facility];  Service: Gynecology;  Laterality: N/A;     OB History    Gravida  1   Para  1   Term  1   Preterm      AB      Living  1     SAB      TAB      Ectopic      Multiple      Live Births  1           Family History  Problem Relation Age of Onset  . Hypertension Mother   . Congenital heart disease Mother   . Diabetes Mother   . Thyroid disease Brother   . Other Daughter        bowel issues  . Colon cancer Neg Hx     Social History   Tobacco Use  . Smoking status: Current Every Day Smoker    Packs/day: 1.00    Years: 44.00    Pack years: 44.00    Types: Cigarettes  . Smokeless tobacco: Never Used  . Tobacco comment: smokes 3 cig daily  Vaping Use  . Vaping Use: Never used  Substance Use Topics  . Alcohol use: No    Comment: 01-06-2016 Per pt rarely, 02-05-2016 per pt no but 68yrs ago    . Drug use: No    Comment: 02-05-2016 per pt no but about 40 yrs ago    Home Medications Prior to Admission medications   Medication Sig Start Date End Date Taking? Authorizing Provider  ALPRAZolam Duanne Moron) 1 MG tablet Take 1 mg by mouth 2 (two) times daily. 09/24/19  Yes [provider]  amLODipine (NORVASC) 5 MG tablet Take 5 mg by mouth daily.  07/06/18  Yes [provider]  atorvastatin (LIPITOR) 40 MG tablet Take 40 mg by mouth at bedtime.    Yes [provider]  CHANTIX 1 MG tablet Take 1 mg by mouth 2 (two) times daily. 09/24/19  Yes [provider]  FLUoxetine (PROZAC) 20 MG capsule Take 1 capsule by mouth daily. Take with 40mg  cap 09/13/19  Yes [provider]  FLUoxetine (PROZAC) 40 MG capsule Take 40 mg by mouth daily. Take with 20mg  cap 10/10/19  Yes [provider]  lisinopril (PRINIVIL,ZESTRIL) 40 MG tablet Take 40 mg by mouth daily with breakfast.    Yes [provider]  vitamin B-12 (CYANOCOBALAMIN) 500 MCG tablet Take 1,000  mcg by mouth daily.   Yes [provider]  alendronate (FOSAMAX) 70 MG tablet Take 1 tablet by mouth once a week. 12/11/18   [provider]  amoxicillin-clavulanate (AUGMENTIN) 875-125 MG tablet Take 1 tablet by mouth 2 (two) times daily. 10/21/19   Carlito Bogert, PA-C  HYDROcodone-acetaminophen (NORCO/VICODIN) 5-325 MG tablet Take 2 tablets by mouth every 4 (four) hours as needed. 10/21/19   Paytan Recine, PA-C    Allergies    Zithromax [azithromycin] and Prednisone  Review of Systems   Review of Systems  Constitutional: Negative for chills and fever.  Respiratory: Negative for shortness  of breath.   Cardiovascular: Negative for chest pain.  Musculoskeletal: Negative for arthralgias, back pain and joint swelling.  Skin: Positive for wound.       Multiple dog bites with 2 large lacerations to the left forearm  Neurological: Negative for dizziness, weakness and numbness.  Hematological: Does not bruise/bleed easily.    Physical Exam Updated Vital Signs BP (!) 136/92 (BP Location: Right Arm)   Pulse 73   Temp 98.1 F (36.7 C) (Oral)   Resp 14   Ht 5\' 6"  (1.676 m)   Wt 58.1 kg   SpO2 97%   BMI 20.66 kg/m   Physical Exam Vitals and nursing note reviewed.  Constitutional:      General: She is not in acute distress.    Appearance: Normal appearance. She is well-developed.  HENT:     Head: Atraumatic.  Cardiovascular:     Rate and Rhythm: Normal rate and regular rhythm.     Pulses: Normal pulses.  Pulmonary:     Effort: Pulmonary effort is normal. No respiratory distress.     Breath sounds: Normal breath sounds.  Musculoskeletal:        General: Signs of injury present. No swelling or tenderness. Normal range of motion.     Comments: Patient with multiple puncture wounds of the mid left forearm with 2 gaping lacerations.  4 cm laceration with exposed muscle at the mid dorsal forearm. 4 cm laceration of the medial forearm.  Minimal bleeding  Skin:     General: Skin is warm.     Capillary Refill: Capillary refill takes less than 2 seconds.     Findings: Laceration present.  Neurological:     General: No focal deficit present.     Mental Status: She is alert.     Sensory: No sensory deficit.     Motor: No weakness or abnormal muscle tone.     ED Results / Procedures / Treatments   Labs (all labs ordered are listed, but only abnormal results are displayed) Labs Reviewed - No data to display  EKG None  Radiology No results found.  Procedures Procedures (including critical care time)  LACERATION REPAIR #1 Performed by: Hazelene Doten Authorized by: Aspin Palomarez Consent: Verbal consent obtained. Risks and benefits: risks, benefits and alternatives were discussed Consent given by: patient Patient identity confirmed: provided demographic data Prepped and Draped in normal sterile fashion Wound explored  Laceration Location: dorsal left forearm  Laceration Length: 4 cm  No Foreign Bodies seen or palpated  Anesthesia: local infiltration  Local anesthetic: lidocaine 2% w/ epinephrine  Anesthetic total: 3 ml  Irrigation method: syringe Amount of cleaning: extensive irrigation with sterile saline   disruption of fascia, closed with 5-0 vicryl.  No muscle injury seen on FROM  Skin closure:  Very loosely approximated with 4-0 prolene  Number of sutures: 2  Technique: simple interrupted  Patient tolerance: Patient tolerated the procedure well with no immediate complications.  LACERATION REPAIR #2 Performed by: Annelies Coyt Authorized by: Latham Kinzler Consent: Verbal consent obtained. Risks and benefits: risks, benefits and alternatives were discussed Consent given by: patient Patient identity confirmed: provided demographic data Prepped and Draped in normal sterile fashion Wound explored  Laceration Location: left mid forearm (medial)  Laceration Length: 4 cm  No Foreign Bodies seen or  palpated  Anesthesia: local infiltration  Local anesthetic: lidocaine 2% w/ epinephrine  Anesthetic total: 3 ml  Irrigation method: syringe Amount of cleaning: extensive, with sterile saline  Skin closure:  very loosely approximated with 4-0 prolene  Number of sutures: 2  Technique: simple interrupted  Patient tolerance: Patient tolerated the procedure well with no immediate complications.    Medications Ordered in ED Medications  Tdap (BOOSTRIX) injection 0.5 mL (0.5 mLs Intramuscular Given 10/21/19 1608)  amoxicillin-clavulanate (AUGMENTIN) 875-125 MG per tablet 1 tablet (1 tablet Oral Given 10/21/19 1608)  lidocaine-EPINEPHrine (XYLOCAINE W/EPI) 2 %-1:200000 (PF) injection 10 mL (10 mLs Intradermal Given 10/21/19 1610)  rabies immune globulin (HYPERAB/KEDRAB) injection 1,125 Units (1,125 Units Intramuscular Given 10/21/19 1648)  rabies vaccine (RABAVERT) injection 1 mL (1 mL Intramuscular Given 10/21/19 1621)    ED Course  I have reviewed the triage vital signs and the nursing notes.  Pertinent labs & imaging results that were available during my care of the patient were reviewed by me and considered in my medical decision making (see chart for details).    MDM Rules/Calculators/A&P                          Pt here with multiple puncture wounds and 2 large lacerations of the mid left forearm secondary to dog bite.  Neurovascularly intact.  Patient has full range of motion of the fingers and wrist of the left hand.  Bleeding controlled.  TD updated initial rabies vaccine and rabies immunoglobulin given here as well as initial dose of antibiotics.  Patient had exposed muscle to the dorsal laceration.  wounds were explored throughout range of motion fascia repaired after extensive irrigation.  The 2 lacerations were gaping, so they were very loosely approximated.  Explained need for loose approximation of these wounds and risk associated with closure and animal bites to include wound  infection abscess and sepsis.  I have also thoroughly explained importance of close outpatient follow-up and I recommended patient return to the emergency department for recheck in 2 to 3 days if she is developing any swelling, fever, chills, redness or increasing pain.  Dogs rabies vaccinations are unknown at this time.  Will have patient follow-up local urgent care for subsequent vaccines.  Patient verbalized understanding of these instructions and agrees to plan.  Will start prescription for Augmentin.    Final Clinical Impression(s) / ED Diagnoses Final diagnoses:  Dog bite, initial encounter    Rx / DC Orders ED Discharge Orders         Ordered    HYDROcodone-acetaminophen (NORCO/VICODIN) 5-325 MG tablet  Every 4 hours PRN     Discontinue  Reprint     10/21/19 1815    amoxicillin-clavulanate (AUGMENTIN) 875-125 MG tablet  2 times daily     Discontinue  Reprint     10/21/19 1816           Kem Parkinson, PA-C 10/24/19 1439    Fredia Sorrow, MD 10/24/19 1650

## 2019-10-29 ENCOUNTER — Other Ambulatory Visit: Payer: Self-pay

## 2019-10-29 ENCOUNTER — Ambulatory Visit
Admission: EM | Admit: 2019-10-29 | Discharge: 2019-10-29 | Disposition: A | Discharge status: Home / Self Care | Payer: PPO | Attending: Emergency Medicine | Admitting: Emergency Medicine

## 2019-10-29 ENCOUNTER — Encounter: Payer: Self-pay | Admitting: Emergency Medicine

## 2019-10-29 DIAGNOSIS — Z203 Contact with and (suspected) exposure to rabies: Secondary | ICD-10-CM | POA: Diagnosis not present

## 2019-10-29 MED ORDER — RABIES VACCINE, PCEC IM SUSR
1.0000 mL | Freq: Once | INTRAMUSCULAR | Status: AC
  Administered 2019-10-29: 1 mL via INTRAMUSCULAR

## 2019-10-29 NOTE — ED Triage Notes (Signed)
Presents for day 7 rabies injection.   Pt also requesting provider to see her wound to see if stitches can be removed.

## 2019-10-29 NOTE — ED Triage Notes (Signed)
Per provider stitches okay to be removed.

## 2019-10-30 DIAGNOSIS — R52 Pain, unspecified: Secondary | ICD-10-CM | POA: Diagnosis not present

## 2019-10-30 DIAGNOSIS — M545 Low back pain: Secondary | ICD-10-CM | POA: Diagnosis not present

## 2019-11-14 DIAGNOSIS — F419 Anxiety disorder, unspecified: Secondary | ICD-10-CM | POA: Diagnosis not present

## 2019-11-14 DIAGNOSIS — G4700 Insomnia, unspecified: Secondary | ICD-10-CM | POA: Diagnosis not present

## 2019-11-14 DIAGNOSIS — E785 Hyperlipidemia, unspecified: Secondary | ICD-10-CM | POA: Diagnosis not present

## 2019-12-20 DIAGNOSIS — F419 Anxiety disorder, unspecified: Secondary | ICD-10-CM | POA: Diagnosis not present

## 2019-12-20 DIAGNOSIS — Z79891 Long term (current) use of opiate analgesic: Secondary | ICD-10-CM | POA: Diagnosis not present

## 2019-12-24 ENCOUNTER — Telehealth: Payer: Self-pay | Admitting: Obstetrics & Gynecology

## 2019-12-24 NOTE — Telephone Encounter (Signed)
Patient wants to know if she is supposed to have yearly pap due to cervical cancer or every three years. Patient recall states she is due this month. Please confirm.

## 2019-12-26 ENCOUNTER — Telehealth: Payer: Self-pay | Admitting: Obstetrics & Gynecology

## 2019-12-26 NOTE — Telephone Encounter (Signed)
Would like someone to review lab results with her

## 2019-12-26 NOTE — Telephone Encounter (Signed)
Telephoned patient at home number and left message with husband.

## 2020-01-07 ENCOUNTER — Other Ambulatory Visit: Payer: PPO | Admitting: Women's Health

## 2020-01-10 DIAGNOSIS — G4700 Insomnia, unspecified: Secondary | ICD-10-CM | POA: Diagnosis not present

## 2020-01-10 DIAGNOSIS — F321 Major depressive disorder, single episode, moderate: Secondary | ICD-10-CM | POA: Diagnosis not present

## 2020-01-10 DIAGNOSIS — F17210 Nicotine dependence, cigarettes, uncomplicated: Secondary | ICD-10-CM | POA: Diagnosis not present

## 2020-01-10 DIAGNOSIS — F419 Anxiety disorder, unspecified: Secondary | ICD-10-CM | POA: Diagnosis not present

## 2020-01-17 ENCOUNTER — Other Ambulatory Visit (HOSPITAL_COMMUNITY)
Admission: RE | Admit: 2020-01-17 | Discharge: 2020-01-17 | Disposition: A | Discharge status: Home / Self Care | Payer: PPO | Source: Ambulatory Visit | Attending: Adult Health | Admitting: Adult Health

## 2020-01-17 ENCOUNTER — Other Ambulatory Visit: Payer: Self-pay

## 2020-01-17 ENCOUNTER — Encounter: Payer: Self-pay | Admitting: Adult Health

## 2020-01-17 ENCOUNTER — Ambulatory Visit (INDEPENDENT_AMBULATORY_CARE_PROVIDER_SITE_OTHER): Payer: PPO | Admitting: Adult Health

## 2020-01-17 VITALS — BP 129/83 | HR 79 | Ht 66.0 in | Wt 138.4 lb

## 2020-01-17 DIAGNOSIS — Z01419 Encounter for gynecological examination (general) (routine) without abnormal findings: Secondary | ICD-10-CM | POA: Insufficient documentation

## 2020-01-17 DIAGNOSIS — Z1151 Encounter for screening for human papillomavirus (HPV): Secondary | ICD-10-CM | POA: Insufficient documentation

## 2020-01-17 DIAGNOSIS — Z8541 Personal history of malignant neoplasm of cervix uteri: Secondary | ICD-10-CM | POA: Insufficient documentation

## 2020-01-17 DIAGNOSIS — C530 Malignant neoplasm of endocervix: Secondary | ICD-10-CM

## 2020-01-17 DIAGNOSIS — Z90710 Acquired absence of both cervix and uterus: Secondary | ICD-10-CM | POA: Diagnosis not present

## 2020-01-17 DIAGNOSIS — Z1211 Encounter for screening for malignant neoplasm of colon: Secondary | ICD-10-CM | POA: Diagnosis not present

## 2020-01-17 DIAGNOSIS — Z08 Encounter for follow-up examination after completed treatment for malignant neoplasm: Secondary | ICD-10-CM

## 2020-01-17 DIAGNOSIS — L570 Actinic keratosis: Secondary | ICD-10-CM

## 2020-01-17 LAB — HEMOCCULT GUIAC POC 1CARD (OFFICE): Fecal Occult Blood, POC: NEGATIVE

## 2020-01-17 NOTE — Progress Notes (Signed)
Patient ID: [name], female   DOB: Sep 29, 1955, 64 y.o.   MRN: [id number] History of Present Illness:  [name] is a 64 year old white female,separated, sp hysterectomy, had cervical cancer and radiation, in for well woman gyn exam and pap. PCP is the [facility].  Current Medications, Allergies, Past Medical History, Past Surgical History, Family History and Social History were reviewed in [employer] record.     Review of Systems: Patient denies any headaches, hearing loss, fatigue, blurred vision, shortness of breath, chest pain, abdominal pain, problems with bowel movements, urination, or intercourse(not having sex). No joint pain or mood swings. Has pain at times in mons pubis area.    Physical Exam:BP 129/83 (BP Location: Right Arm, Patient Position: Sitting, Cuff Size: Normal)   Pulse 79   Ht 5\' 6"  (1.676 m)   Wt 138 lb 6.4 oz (62.8 kg)   BMI 22.34 kg/m  General:  Well developed, well nourished, no acute distress Skin:  Warm and dry Neck:  Midline trachea, normal thyroid, good ROM, no lymphadenopathy,no carotid bruits heard Lungs; has bilateral inspiratory  Wheezes that clear with coughing Breast:  No dominant palpable mass, retraction, or nipple discharge Cardiovascular: Regular rate and rhythm Abdomen:  Soft, non tender, no hepatosplenomegaly Pelvic:  External genitalia is normal in appearance, no lesions.  The vagina is pale, with loss of moisture and rugae, no lesions seen and vaginal pap performed with high risk HPV genotyping. Urethra has no lesions or masses. The cervix and uterus are absent.  No adnexal masses or tenderness noted.Bladder is non tender, no masses felt. Rectal: Good sphincter tone, no polyps, or hemorrhoids felt.  Hemoccult negative. Extremities/musculoskeletal:  No swelling or varicosities noted, no clubbing or cyanosis Psych:  No mood changes, alert and cooperative,seems happy AA is 0 Fall risk is low PHQ 2 score is  1  Examination chaperoned by Genia Hotter RN  Impression and Plan: 1. Well woman exam with routine gynecological exam Get mammogram yearly  Advised to get colonoscopy Labs with PCP  Talk with PCP about getting low dose CT, since she smokes, before visit in December  2. Encounter for vaginal Papanicolaou smear following hysterectomy for malignancy Pap sent Pap and physical in 1 year   3. Encounter for screening fecal occult blood testing  4. AK (actinic keratosis) See Dr Nevada Crane   5. 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

## 2020-01-22 LAB — CYTOLOGY - PAP
Comment: NEGATIVE
Diagnosis: NEGATIVE
High risk HPV: NEGATIVE

## 2020-01-24 ENCOUNTER — Telehealth: Payer: Self-pay

## 2020-01-24 NOTE — Telephone Encounter (Signed)
Pt informed of pap results.

## 2020-01-24 NOTE — Telephone Encounter (Signed)
Pt called about her lab results from 10/7 visit

## 2020-01-31 ENCOUNTER — Ambulatory Visit (HOSPITAL_COMMUNITY)
Admission: RE | Admit: 2020-01-31 | Discharge: 2020-01-31 | Disposition: A | Discharge status: Home / Self Care | Payer: PPO | Source: Ambulatory Visit | Attending: Adult Health | Admitting: Adult Health

## 2020-01-31 ENCOUNTER — Other Ambulatory Visit: Payer: Self-pay

## 2020-01-31 ENCOUNTER — Other Ambulatory Visit (HOSPITAL_COMMUNITY): Payer: Self-pay | Admitting: Adult Health

## 2020-01-31 DIAGNOSIS — H9202 Otalgia, left ear: Secondary | ICD-10-CM | POA: Diagnosis not present

## 2020-01-31 DIAGNOSIS — M79672 Pain in left foot: Secondary | ICD-10-CM | POA: Insufficient documentation

## 2020-01-31 DIAGNOSIS — F17210 Nicotine dependence, cigarettes, uncomplicated: Secondary | ICD-10-CM | POA: Diagnosis not present

## 2020-01-31 DIAGNOSIS — Z9181 History of falling: Secondary | ICD-10-CM | POA: Diagnosis not present

## 2020-01-31 DIAGNOSIS — R059 Cough, unspecified: Secondary | ICD-10-CM | POA: Diagnosis not present

## 2020-01-31 DIAGNOSIS — S92352A Displaced fracture of fifth metatarsal bone, left foot, initial encounter for closed fracture: Secondary | ICD-10-CM | POA: Diagnosis not present

## 2020-01-31 DIAGNOSIS — Z23 Encounter for immunization: Secondary | ICD-10-CM | POA: Diagnosis not present

## 2020-01-31 DIAGNOSIS — M810 Age-related osteoporosis without current pathological fracture: Secondary | ICD-10-CM | POA: Diagnosis not present

## 2020-02-06 DIAGNOSIS — J309 Allergic rhinitis, unspecified: Secondary | ICD-10-CM | POA: Diagnosis not present

## 2020-02-06 DIAGNOSIS — E785 Hyperlipidemia, unspecified: Secondary | ICD-10-CM | POA: Diagnosis not present

## 2020-02-06 DIAGNOSIS — I10 Essential (primary) hypertension: Secondary | ICD-10-CM | POA: Diagnosis not present

## 2020-02-08 DIAGNOSIS — M79672 Pain in left foot: Secondary | ICD-10-CM | POA: Diagnosis not present

## 2020-02-14 ENCOUNTER — Other Ambulatory Visit: Payer: Self-pay | Admitting: Adult Health

## 2020-02-14 DIAGNOSIS — Z87891 Personal history of nicotine dependence: Secondary | ICD-10-CM

## 2020-02-27 ENCOUNTER — Ambulatory Visit (HOSPITAL_COMMUNITY)
Admission: RE | Admit: 2020-02-27 | Discharge: 2020-02-27 | Disposition: A | Discharge status: Home / Self Care | Payer: PPO | Source: Ambulatory Visit | Attending: Adult Health | Admitting: Adult Health

## 2020-02-27 ENCOUNTER — Other Ambulatory Visit: Payer: Self-pay

## 2020-02-27 ENCOUNTER — Encounter (HOSPITAL_COMMUNITY): Payer: Self-pay

## 2020-02-27 DIAGNOSIS — Z87891 Personal history of nicotine dependence: Secondary | ICD-10-CM

## 2020-02-28 ENCOUNTER — Telehealth: Payer: Self-pay | Admitting: Adult Health

## 2020-02-28 NOTE — Telephone Encounter (Signed)
Pt informed that her PCP needs to order her CT Scan.

## 2020-02-28 NOTE — Telephone Encounter (Signed)
Patient called stating that she would like for [facility] to schedule a CT scan for her, pt states that the last time her cancer was found Dr. Glo Herring was the one that found it. Please contact pt

## 2020-03-05 ENCOUNTER — Ambulatory Visit
Admission: EM | Admit: 2020-03-05 | Discharge: 2020-03-05 | Disposition: A | Discharge status: Home / Self Care | Payer: PPO | Attending: Emergency Medicine | Admitting: Emergency Medicine

## 2020-03-05 ENCOUNTER — Other Ambulatory Visit: Payer: Self-pay

## 2020-03-05 ENCOUNTER — Ambulatory Visit (INDEPENDENT_AMBULATORY_CARE_PROVIDER_SITE_OTHER): Payer: PPO

## 2020-03-05 DIAGNOSIS — R059 Cough, unspecified: Secondary | ICD-10-CM

## 2020-03-05 MED ORDER — BENZONATATE 100 MG PO CAPS
100.0000 mg | ORAL_CAPSULE | Freq: Three times a day (TID) | ORAL | 0 refills | Status: DC
Start: 2020-03-05 — End: 2021-08-19

## 2020-03-05 MED ORDER — ALBUTEROL SULFATE HFA 108 (90 BASE) MCG/ACT IN AERS: 1.0000 | INHALATION_SPRAY | Freq: Four times a day (QID) | RESPIRATORY_TRACT | 0 refills | Status: DC | PRN

## 2020-03-05 MED ORDER — AMOXICILLIN-POT CLAVULANATE 875-125 MG PO TABS
1.0000 | ORAL_TABLET | Freq: Two times a day (BID) | ORAL | 0 refills | Status: DC
Start: 2020-03-05 — End: 2021-01-20

## 2020-03-05 NOTE — Discharge Instructions (Signed)
Get plenty of rest and push fluids Tessalon Perles prescribed for cough Albuterol was prescribed for wheezing Augmentin was prescribed Use medications daily for symptom relief Use OTC medications like ibuprofen or tylenol as needed fever or pain Call or go to the ED if you have any new or worsening symptoms such as fever, worsening cough, shortness of breath, chest tightness, chest pain, turning blue, changes in mental status, etc.

## 2020-03-05 NOTE — ED Triage Notes (Signed)
Pt presents with cough that began about 2 weeks ago , pt also reports ear fullness

## 2020-03-05 NOTE — ED Provider Notes (Addendum)
[facility]   [id number] 03/05/20 Arrival Time: 1024   Chief Complaint  Patient presents with  . Cough  . Ear Fullness     SUBJECTIVE: History from: patient and family.  [name] is a 64 y.o. female with history of smoking presented to the urgent care with a complaint of cough and ear fullness for the past 2 weeks.  Denies sick exposure to COVID, flu or strep.  Denies recent travel.  Has tried OTC medication without relief.  Denies alleviating factors.  Denies previous symptoms in the past.   Denies fever, chills, fatigue, sinus pain, rhinorrhea, sore throat, SOB, wheezing, chest pain, nausea, changes in bowel or bladder habits.      ROS: As per HPI.  All other pertinent ROS negative.       Past Medical History:  Diagnosis Date  . Anxiety   . Arthritis    spinal stenosis  . Back disorder    "crooked spine"  . Bulging lumbar disc   . Cervical cancer ([place])   . Complication of anesthesia    ? hypotension 10/2016 at [facility] surgery   . Depression   . H/O degenerative disc disease   . History of radiation therapy 02/22/17-03/22/17   vaginal cuff 30 Gy in 5 fractions  . Hypertension   . Hypothyroidism    patient taken off of hypothyroid med in 11/2016   . Osteoporosis   . Thyroid disease    Past Surgical History:  Procedure Laterality Date  . ABDOMINAL EXPOSURE N/A 09/27/2018   Procedure: ABDOMINAL EXPOSURE;  Surgeon: Serafina Mitchell, MD;  Location: [facility] OR;  Service: Vascular;  Laterality: N/A;  . ANTERIOR AND POSTERIOR REPAIR N/A 11/09/2016   Procedure: ANTERIOR (CYSTOCELE) AND POSTERIOR REPAIR (RECTOCELE);  Surgeon: Jonnie Kind, MD;  Location: [facility];  Service: Gynecology;  Laterality: N/A;  . ANTERIOR LUMBAR FUSION Left 09/27/2018   Procedure: ANTERIOR LUMBAR FUSION L5-S1,;  Surgeon: Melina Schools, MD;  Location: [place];  Service: Orthopedics;  Laterality: Left;  4.5 HRS/ DR. Trula Slade ASSIST  . BILATERAL SALPINGECTOMY Left 11/09/2016   Procedure:  LEFT SALPINGECTOMY;  Surgeon: Jonnie Kind, MD;  Location: [facility];  Service: Gynecology;  Laterality: Left;  . CATARACT EXTRACTION W/PHACO Right 07/05/2016   Procedure: CATARACT EXTRACTION PHACO AND INTRAOCULAR LENS PLACEMENT (IOC);  Surgeon: Tonny Branch, MD;  Location: [facility];  Service: Ophthalmology;  Laterality: Right;  CDE: 15.41  . CATARACT EXTRACTION W/PHACO Left 07/19/2016   Procedure: CATARACT EXTRACTION PHACO AND INTRAOCULAR LENS PLACEMENT LEFT EYE CDE= 12.65;  Surgeon: Tonny Branch, MD;  Location: [facility];  Service: Ophthalmology;  Laterality: Left;  left  . LUMBAR LAMINECTOMY/DECOMPRESSION MICRODISCECTOMY Left 09/27/2018   Procedure: L4-L5 Lumbar Laminectomy/Decompression;  Surgeon: Melina Schools, MD;  Location: [place];  Service: Orthopedics;  Laterality: Left;  . ROBOTIC PELVIC AND PARA-AORTIC LYMPH NODE DISSECTION N/A 01/11/2017   Procedure: XI ROBOTIC BILATERAL PELVIC LYMPH NODE DISSECTION;  Surgeon: [name], MD;  Location: [facility];  Service: Gynecology;  Laterality: N/A;  . SALPINGOOPHORECTOMY Right 11/09/2016   Procedure: RIGHT SALPINGO OOPHORECTOMY;  Surgeon: Jonnie Kind, MD;  Location: [facility];  Service: Gynecology;  Laterality: Right;  . TUBAL LIGATION    . VAGINAL HYSTERECTOMY N/A 11/09/2016   Procedure: HYSTERECTOMY VAGINAL;  Surgeon: Jonnie Kind, MD;  Location: [facility];  Service: Gynecology;  Laterality: N/A;   Allergies  Allergen Reactions  . Zithromax [Azithromycin] Rash and Other (See Comments)    Blisters in  mouth   . Prednisone Nausea Only    Sick to stomach   No current facility-administered medications on file prior to encounter.   Current Outpatient Medications on File Prior to Encounter  Medication Sig Dispense Refill  . alendronate (FOSAMAX) 70 MG tablet Take 1 tablet by mouth once a week.    . ALPRAZolam (XANAX) 1 MG tablet Take 1 mg by mouth 2 (two) times daily.    Marland Kitchen amLODipine (NORVASC) 5 MG tablet Take 5 mg by mouth daily.     Marland Kitchen atorvastatin  (LIPITOR) 40 MG tablet Take 40 mg by mouth at bedtime.     Marland Kitchen buPROPion (WELLBUTRIN XL) 150 MG 24 hr tablet Take 150 mg by mouth daily.    . CHANTIX 1 MG tablet Take 1 mg by mouth 2 (two) times daily. (Patient not taking: Reported on 01/17/2020)    . lisinopril (PRINIVIL,ZESTRIL) 40 MG tablet Take 40 mg by mouth daily with breakfast.     . traZODone (DESYREL) 100 MG tablet Take 100 mg by mouth at bedtime.    . vitamin B-12 (CYANOCOBALAMIN) 500 MCG tablet Take 1,000 mcg by mouth daily. (Patient not taking: Reported on 01/17/2020)     Social History   Socioeconomic History  . Marital status: Legally Separated    Spouse name: Not on file  . Number of children: 1  . Years of education: Not on file  . Highest education level: Not on file  Occupational History  . Not on file  Tobacco Use  . Smoking status: Current Every Day Smoker    Packs/day: 0.25    Years: 44.00    Pack years: 11.00    Types: Cigarettes  . Smokeless tobacco: Never Used  Vaping Use  . Vaping Use: Never used  Substance and Sexual Activity  . Alcohol use: No    Comment: 01-06-2016 Per pt rarely, 02-05-2016 per pt no but 10yrs ago    . Drug use: No    Comment: 02-05-2016 per pt no but about 40 yrs ago  . Sexual activity: Not Currently    Birth control/protection: Surgical    Comment: hyst  Other Topics Concern  . Not on file  Social History Narrative  . Not on file   Social Determinants of Health   Financial Resource Strain: Low Risk   . Difficulty of Paying Living Expenses: Not very hard  Food Insecurity: Food Insecurity Present  . Worried About Charity fundraiser in the Last Year: Sometimes true  . Ran Out of Food in the Last Year: Sometimes true  Transportation Needs: No Transportation Needs  . Lack of Transportation (Medical): No  . Lack of Transportation (Non-Medical): No  Physical Activity: Inactive  . Days of Exercise per Week: 0 days  . Minutes of Exercise per Session: 0 min  Stress: Stress Concern  Present  . Feeling of Stress : Very much  Social Connections: Unknown  . Frequency of Communication with Friends and Family: Twice a week  . Frequency of Social Gatherings with Friends and Family: Patient refused  . Attends Religious Services: Never  . Active Member of Clubs or Organizations: No  . Attends Archivist Meetings: Never  . Marital Status: Married  Human resources officer Violence: Not At Risk  . Fear of Current or Ex-Partner: No  . Emotionally Abused: No  . Physically Abused: No  . Sexually Abused: No   Family History  Problem Relation Age of Onset  . Hypertension Mother   . Congenital  heart disease Mother   . Diabetes Mother   . Thyroid disease Brother   . Other Daughter        bowel issues  . Colon cancer Neg Hx     OBJECTIVE:  Vitals:   03/05/20 1036  BP: 125/81  Pulse: 97  Resp: 20  Temp: 98.1 F (36.7 C)  SpO2: 92%     General appearance: alert; appears fatigued, but nontoxic; speaking in full sentences and tolerating own secretions HEENT: NCAT; Ears: EACs clear, TMs pearly gray; Eyes: PERRL.  EOM grossly intact. Sinuses: nontender; Nose: nares patent without rhinorrhea, Throat: oropharynx clear, tonsils non erythematous or enlarged, uvula midline  Neck: supple without LAD Lungs: unlabored respirations, symmetrical air entry; cough: moderate; no respiratory distress; CTAB Heart: regular rate and rhythm.  Radial pulses 2+ symmetrical bilaterally Skin: warm and dry Psychological: alert and cooperative; normal mood and affect  LABS:  No results found for this or any previous visit (from the past 24 hour(s)).   RADIOLOGY:  DG Chest 2 View  Result Date: 03/05/2020 CLINICAL DATA:  persistent cough EXAM: CHEST - 2 VIEW COMPARISON:  09/25/2018 chest radiograph and prior. FINDINGS: The heart size and mediastinal contours are within normal limits. Both lungs are clear. No pneumothorax or pleural effusion. No acute osseous abnormality. IMPRESSION: No  focal airspace disease. Electronically Signed   By: Primitivo Gauze M.D.   On: 03/05/2020 10:59   Chest X-ray is negative for acute pulmonary disease.  I have reviewed the x-ray myself and the radiologist interpretation.  I am in agreement with the radiologist interpretation.   ASSESSMENT & PLAN:  1. Cough     Meds ordered this encounter  Medications  . benzonatate (TESSALON) 100 MG capsule    Sig: Take 1 capsule (100 mg total) by mouth every 8 (eight) hours.    Dispense:  30 capsule    Refill:  0  . albuterol (VENTOLIN HFA) 108 (90 Base) MCG/ACT inhaler    Sig: Inhale 1-2 puffs into the lungs every 6 (six) hours as needed for wheezing or shortness of breath.    Dispense:  18 g    Refill:  0  . amoxicillin-clavulanate (AUGMENTIN) 875-125 MG tablet    Sig: Take 1 tablet by mouth every 12 (twelve) hours.    Dispense:  14 tablet    Refill:  0    Discharge instructions  Get plenty of rest and push fluids Tessalon Perles prescribed for cough Albuterol was prescribed for wheezing Augmentin was prescribed Use medications daily for symptom relief Use OTC medications like ibuprofen or tylenol as needed fever or pain Call or go to the ED if you have any new or worsening symptoms such as fever, worsening cough, shortness of breath, chest tightness, chest pain, turning blue, changes in mental status, etc...   Reviewed expectations re: course of current medical issues. Questions answered. Outlined signs and symptoms indicating need for more acute intervention. Patient verbalized understanding. After Visit Summary given.         Emerson Monte, FNP 03/05/20 1115    Emerson Monte, FNP 03/05/20 1116

## 2020-03-18 DIAGNOSIS — F419 Anxiety disorder, unspecified: Secondary | ICD-10-CM | POA: Diagnosis not present

## 2020-03-25 DIAGNOSIS — I10 Essential (primary) hypertension: Secondary | ICD-10-CM | POA: Diagnosis not present

## 2020-03-25 DIAGNOSIS — E785 Hyperlipidemia, unspecified: Secondary | ICD-10-CM | POA: Diagnosis not present

## 2020-04-02 DIAGNOSIS — F321 Major depressive disorder, single episode, moderate: Secondary | ICD-10-CM | POA: Diagnosis not present

## 2020-04-02 DIAGNOSIS — I10 Essential (primary) hypertension: Secondary | ICD-10-CM | POA: Diagnosis not present

## 2020-04-02 DIAGNOSIS — F419 Anxiety disorder, unspecified: Secondary | ICD-10-CM | POA: Diagnosis not present

## 2020-04-02 DIAGNOSIS — G4700 Insomnia, unspecified: Secondary | ICD-10-CM | POA: Diagnosis not present

## 2020-04-02 DIAGNOSIS — E785 Hyperlipidemia, unspecified: Secondary | ICD-10-CM | POA: Diagnosis not present

## 2020-06-18 DIAGNOSIS — F419 Anxiety disorder, unspecified: Secondary | ICD-10-CM | POA: Diagnosis not present

## 2020-06-26 DIAGNOSIS — E785 Hyperlipidemia, unspecified: Secondary | ICD-10-CM | POA: Diagnosis not present

## 2020-06-26 DIAGNOSIS — F17210 Nicotine dependence, cigarettes, uncomplicated: Secondary | ICD-10-CM | POA: Diagnosis not present

## 2020-06-26 DIAGNOSIS — F419 Anxiety disorder, unspecified: Secondary | ICD-10-CM | POA: Diagnosis not present

## 2020-06-26 DIAGNOSIS — M810 Age-related osteoporosis without current pathological fracture: Secondary | ICD-10-CM | POA: Diagnosis not present

## 2020-06-26 DIAGNOSIS — F331 Major depressive disorder, recurrent, moderate: Secondary | ICD-10-CM | POA: Diagnosis not present

## 2020-06-26 DIAGNOSIS — G4700 Insomnia, unspecified: Secondary | ICD-10-CM | POA: Diagnosis not present

## 2020-06-26 DIAGNOSIS — I10 Essential (primary) hypertension: Secondary | ICD-10-CM | POA: Diagnosis not present

## 2020-07-22 ENCOUNTER — Ambulatory Visit (HOSPITAL_COMMUNITY): Payer: PPO

## 2020-07-22 ENCOUNTER — Encounter (HOSPITAL_COMMUNITY): Payer: Self-pay

## 2020-09-10 DIAGNOSIS — R600 Localized edema: Secondary | ICD-10-CM | POA: Diagnosis not present

## 2020-09-10 DIAGNOSIS — F419 Anxiety disorder, unspecified: Secondary | ICD-10-CM | POA: Diagnosis not present

## 2020-09-10 DIAGNOSIS — I10 Essential (primary) hypertension: Secondary | ICD-10-CM | POA: Diagnosis not present

## 2020-09-25 DIAGNOSIS — Z0000 Encounter for general adult medical examination without abnormal findings: Secondary | ICD-10-CM | POA: Diagnosis not present

## 2020-09-25 DIAGNOSIS — I10 Essential (primary) hypertension: Secondary | ICD-10-CM | POA: Diagnosis not present

## 2020-09-25 DIAGNOSIS — F419 Anxiety disorder, unspecified: Secondary | ICD-10-CM | POA: Diagnosis not present

## 2020-10-27 DIAGNOSIS — Z79899 Other long term (current) drug therapy: Secondary | ICD-10-CM | POA: Diagnosis not present

## 2020-10-27 DIAGNOSIS — E785 Hyperlipidemia, unspecified: Secondary | ICD-10-CM | POA: Diagnosis not present

## 2020-10-27 DIAGNOSIS — E559 Vitamin D deficiency, unspecified: Secondary | ICD-10-CM | POA: Diagnosis not present

## 2020-10-27 DIAGNOSIS — I10 Essential (primary) hypertension: Secondary | ICD-10-CM | POA: Diagnosis not present

## 2020-10-27 DIAGNOSIS — Z131 Encounter for screening for diabetes mellitus: Secondary | ICD-10-CM | POA: Diagnosis not present

## 2020-10-27 DIAGNOSIS — Z1329 Encounter for screening for other suspected endocrine disorder: Secondary | ICD-10-CM | POA: Diagnosis not present

## 2020-10-30 DIAGNOSIS — R058 Other specified cough: Secondary | ICD-10-CM | POA: Diagnosis not present

## 2020-10-30 DIAGNOSIS — R252 Cramp and spasm: Secondary | ICD-10-CM | POA: Diagnosis not present

## 2020-10-30 DIAGNOSIS — N390 Urinary tract infection, site not specified: Secondary | ICD-10-CM | POA: Diagnosis not present

## 2020-10-30 DIAGNOSIS — J449 Chronic obstructive pulmonary disease, unspecified: Secondary | ICD-10-CM | POA: Diagnosis not present

## 2020-10-30 DIAGNOSIS — F17210 Nicotine dependence, cigarettes, uncomplicated: Secondary | ICD-10-CM | POA: Diagnosis not present

## 2021-01-05 IMAGING — CR CHEST - 2 VIEW
2 series · 2 of 2 positions shown · non-contrast
Comparison: Chest x-ray dated 05/02/2017.

CLINICAL DATA: Preop clearance.

EXAM:
CHEST - 2 VIEW

[w chest pa]
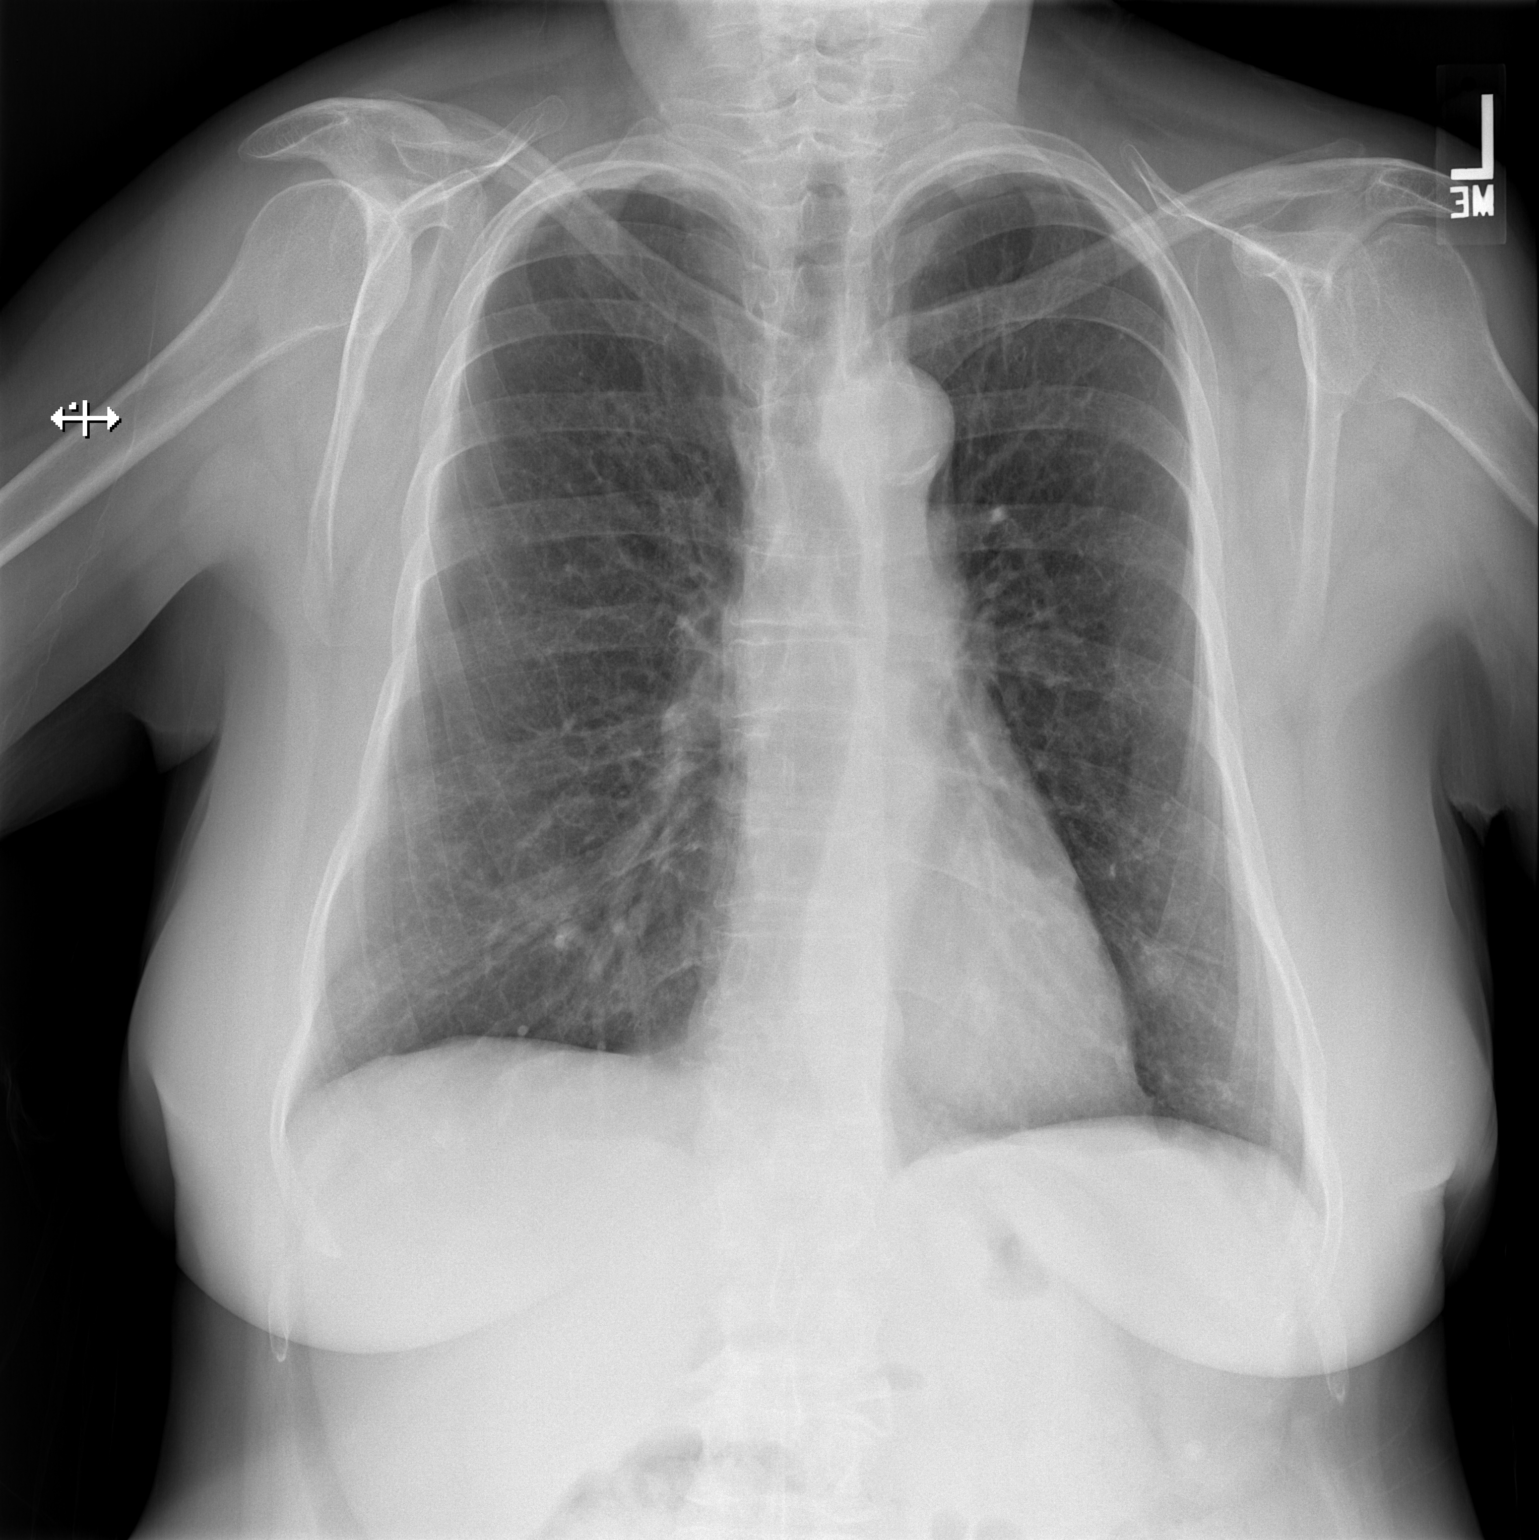

[w chest lat]
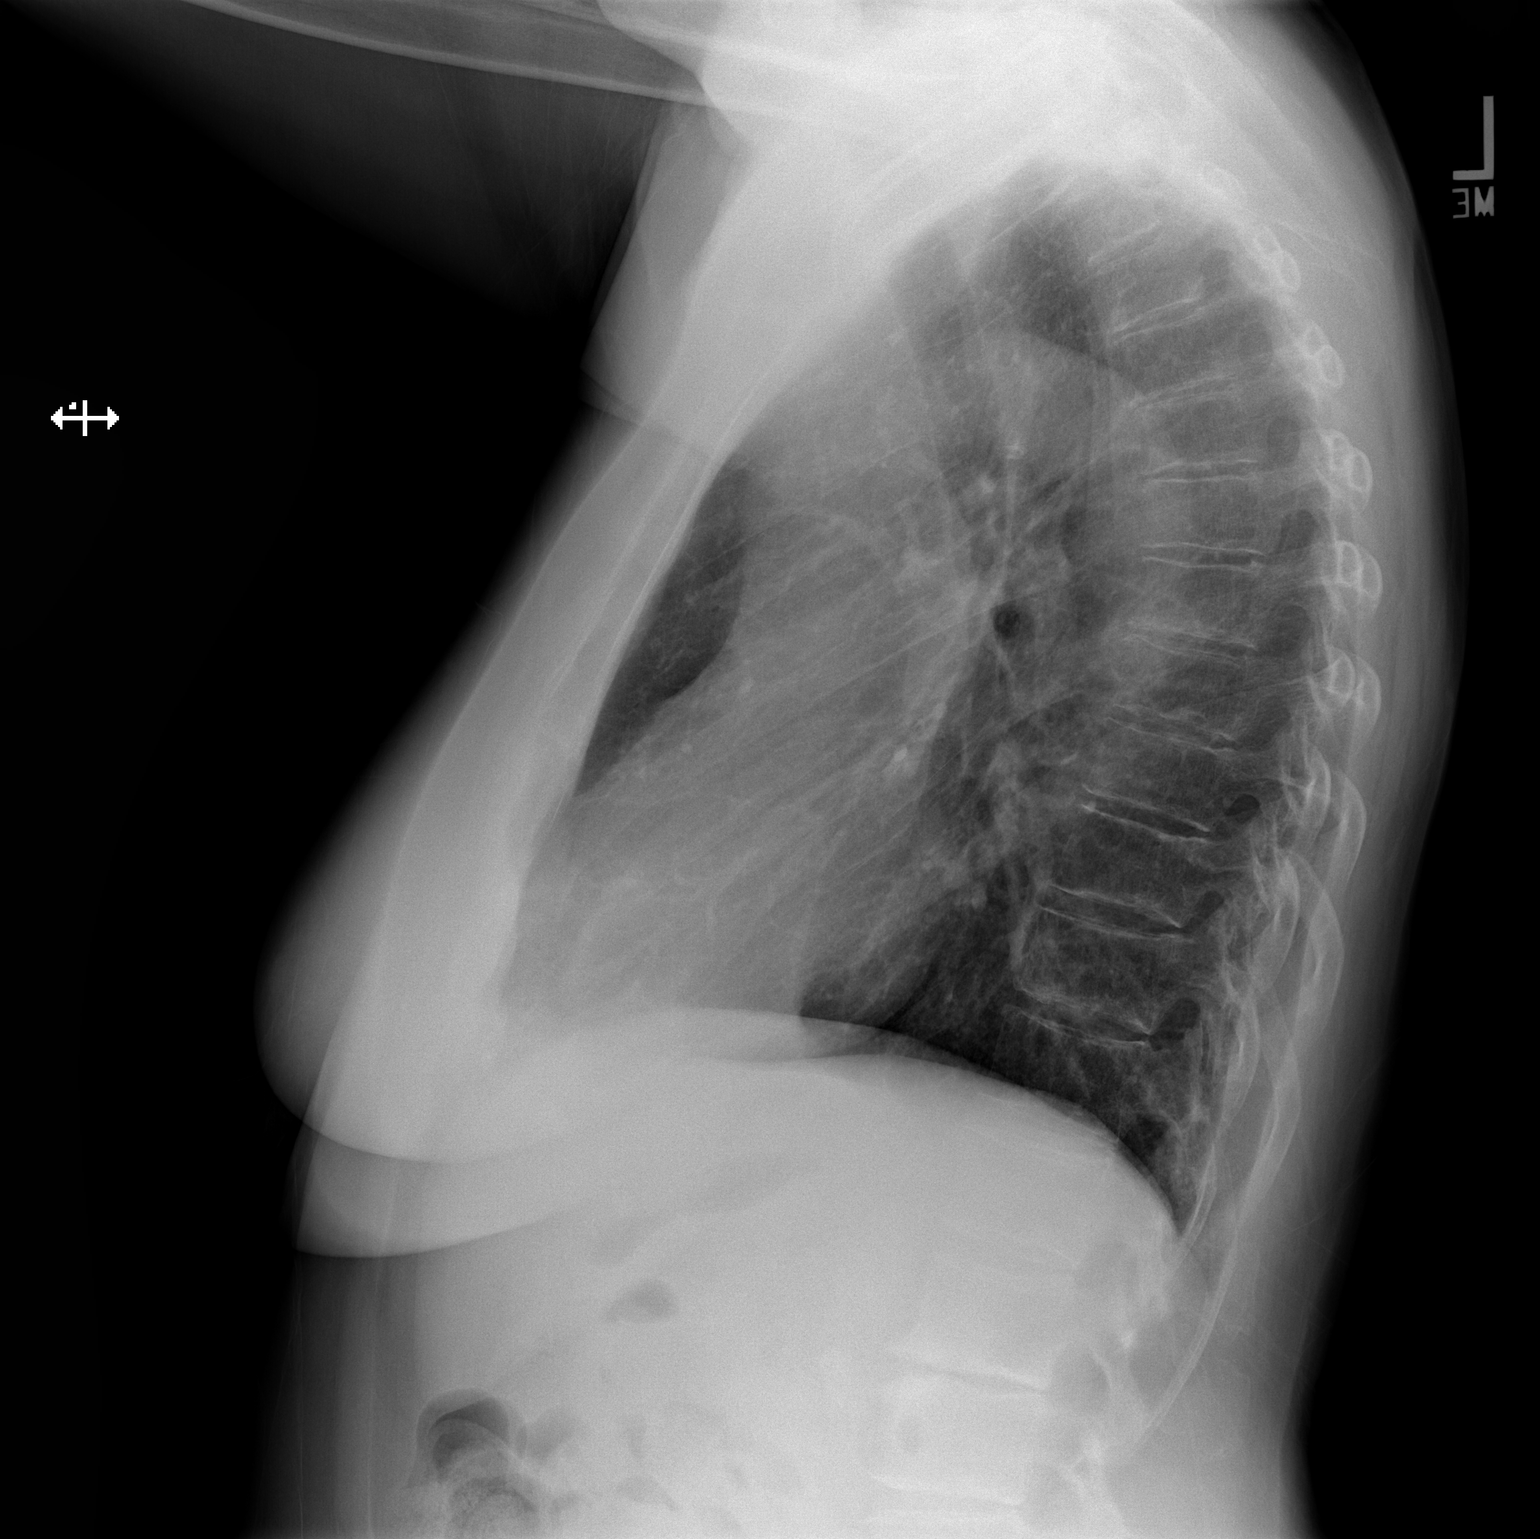

[2 of 2 positions shown; findings below may reference images not displayed]

FINDINGS: The heart size and mediastinal contours are within normal limits.
Both lungs are clear. The visualized skeletal structures are
unremarkable.
IMPRESSION: No active cardiopulmonary disease.

## 2021-01-07 IMAGING — RF LUMBAR SPINE - 2-3 VIEW
1 series · 6 of 6 positions shown · non-contrast
Comparison: 09/27/2018

CLINICAL DATA: Back pain

EXAM:
LUMBAR SPINE - 2-3 VIEW

[Series 1: run · 6 of 6 slices shown]
[im 1/6]
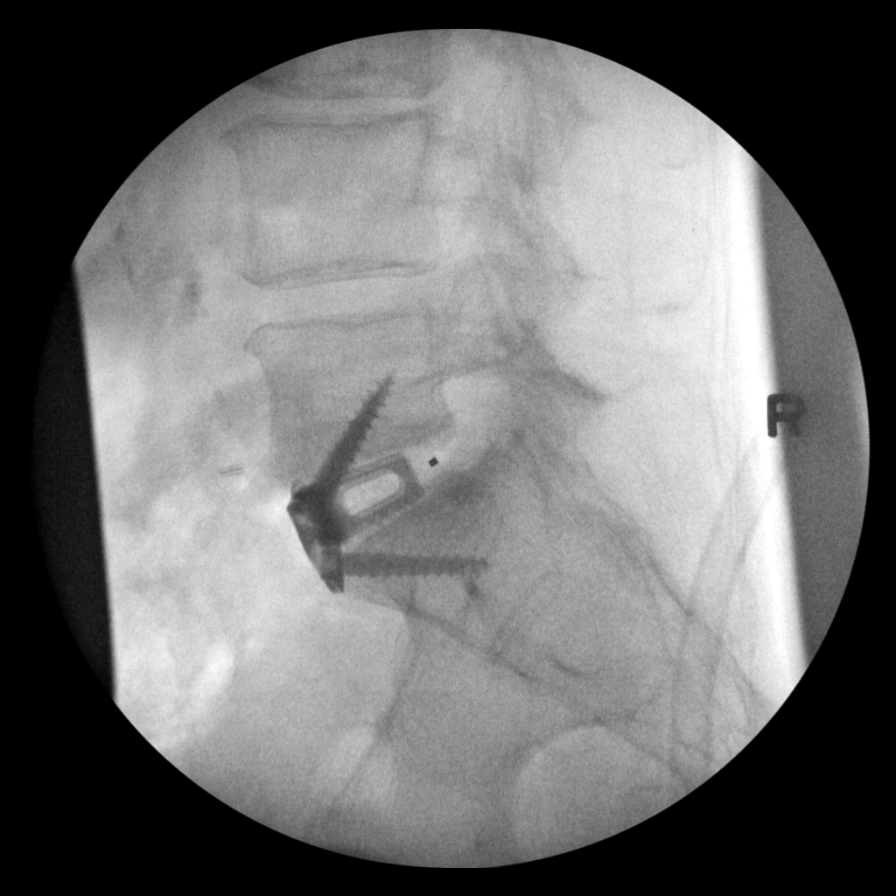
[im 2/6]
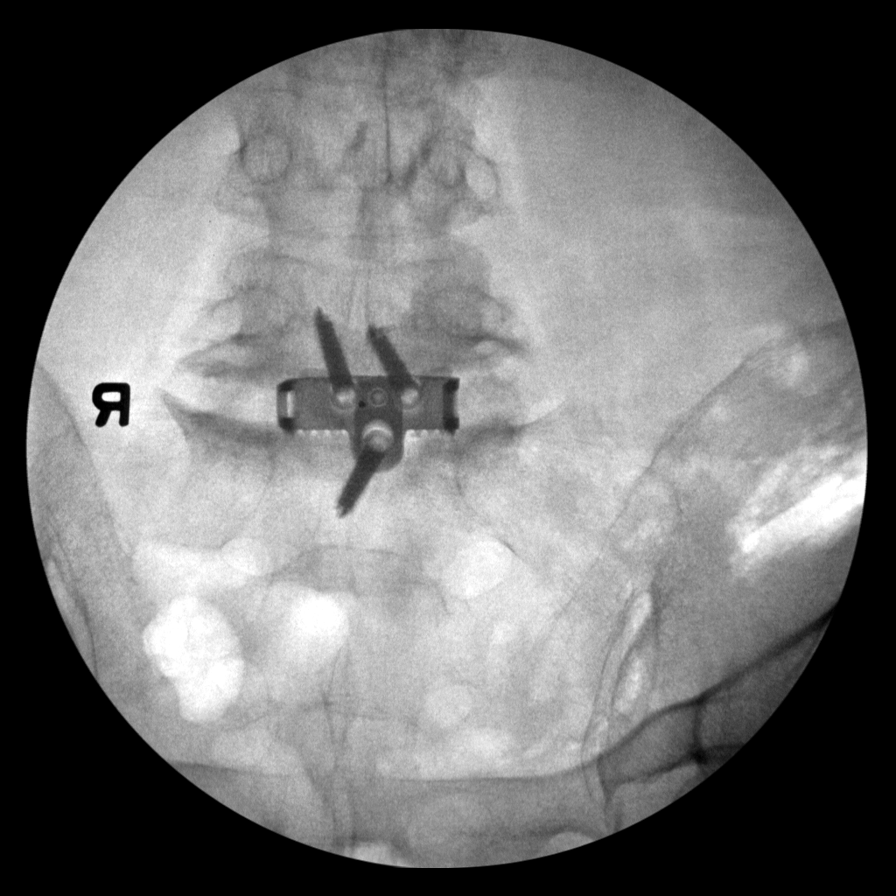
[im 3/6]
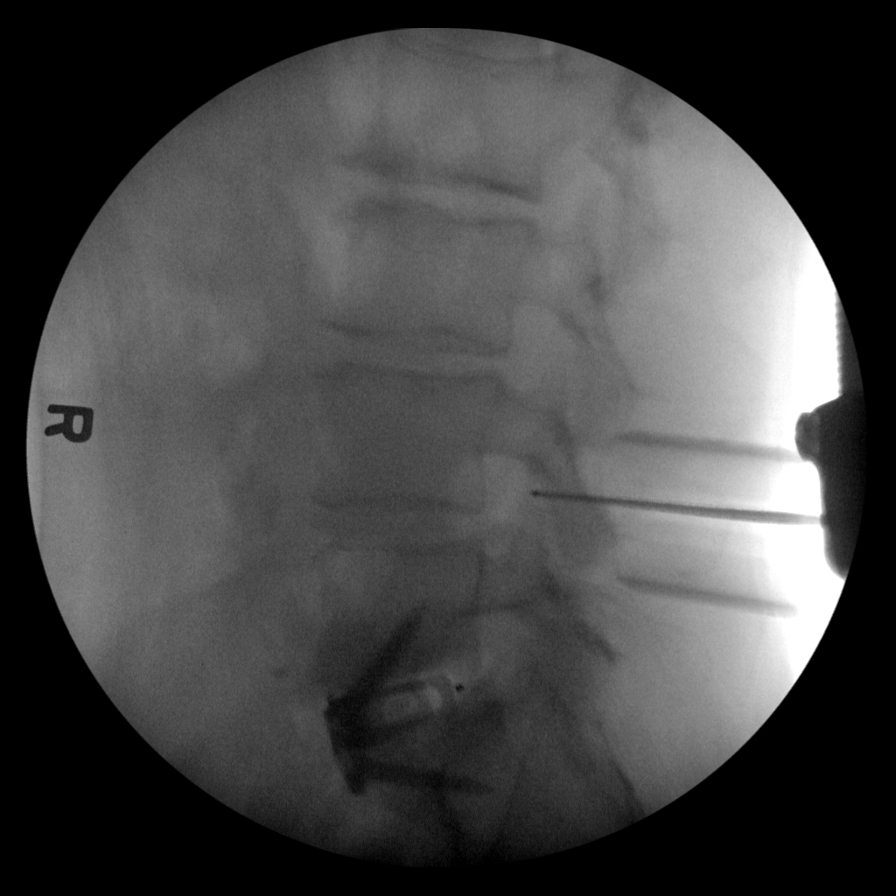
[im 4/6]
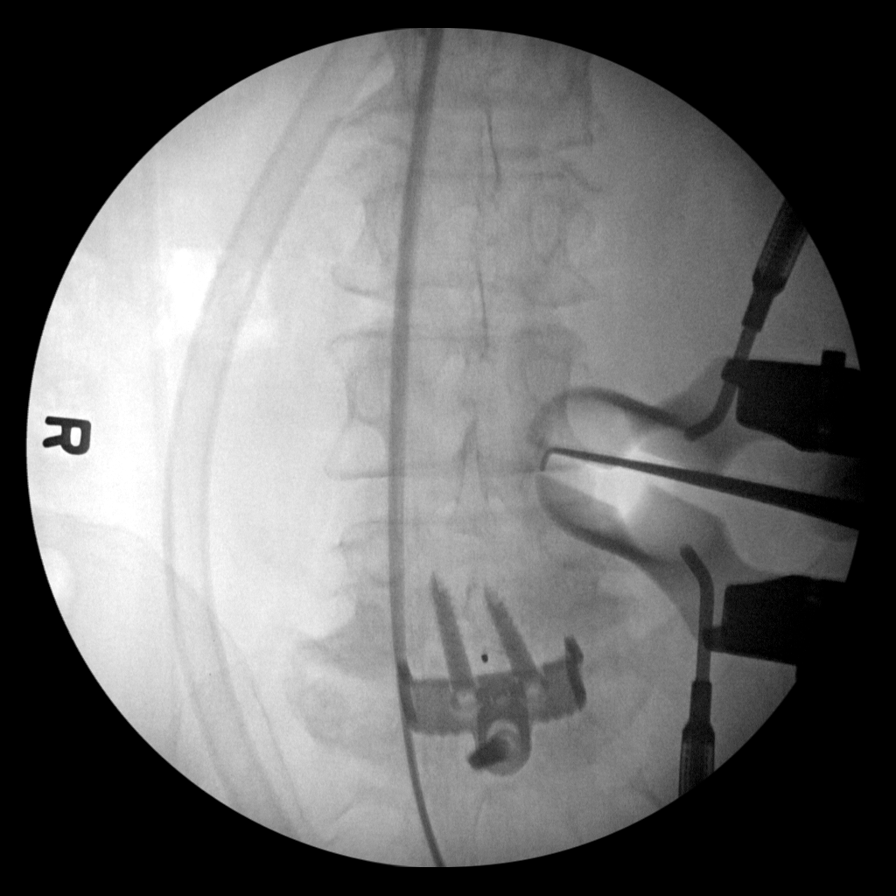
[im 5/6]
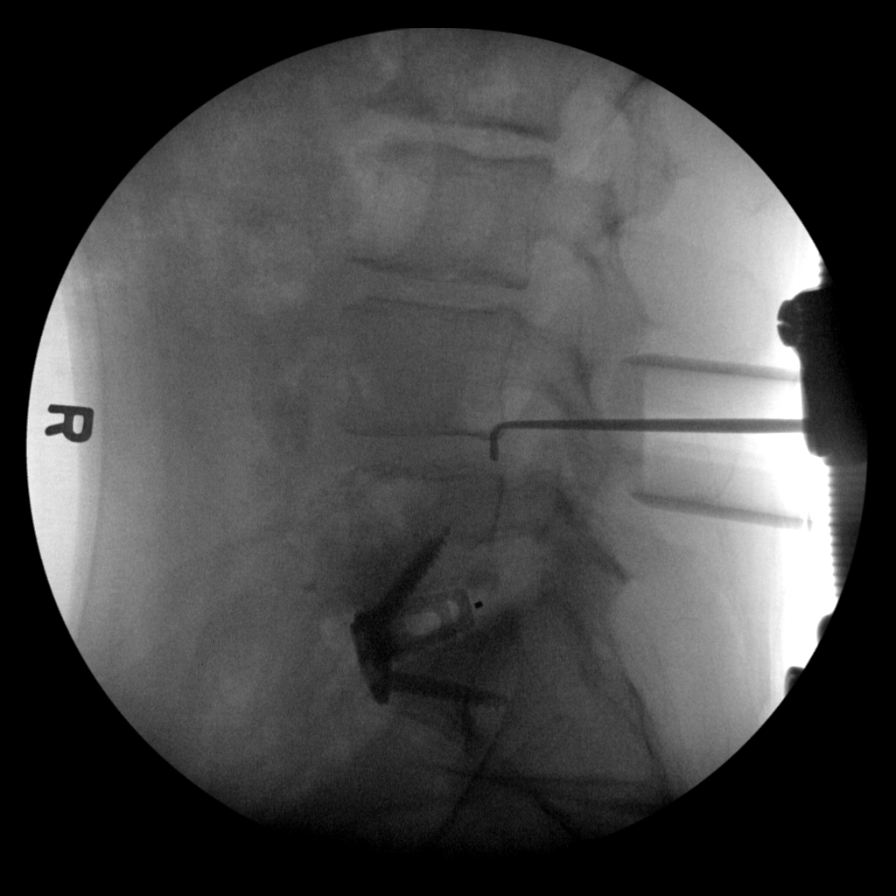
[im 6/6]
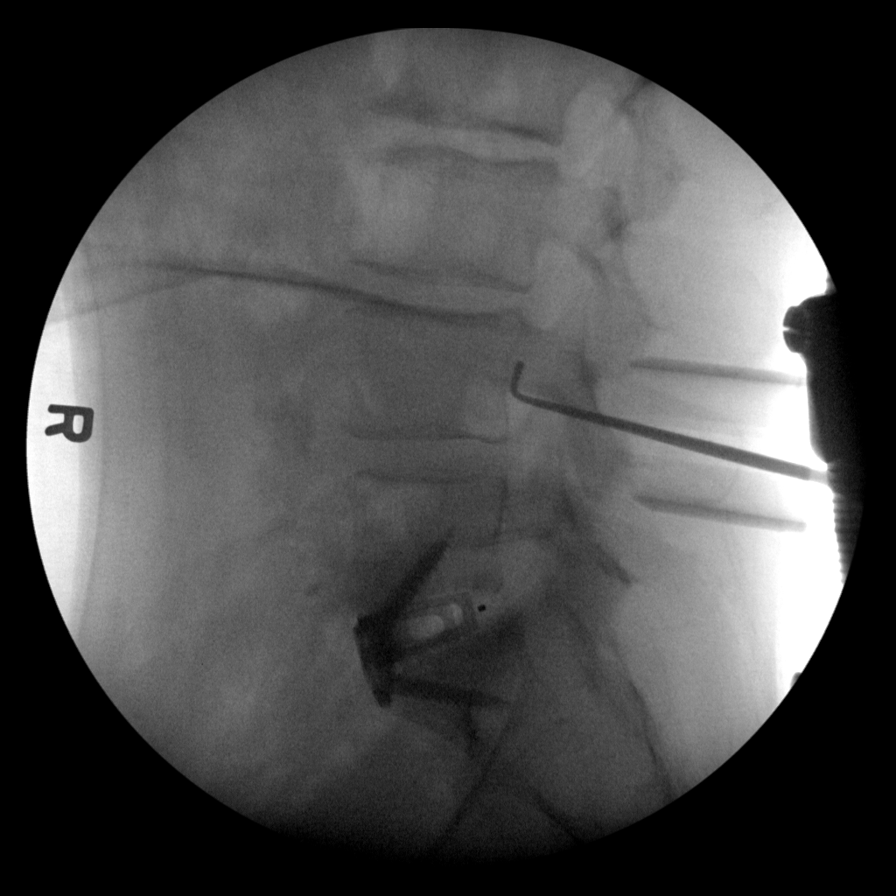

[6 of 6 positions shown; findings below may reference images not displayed]

FINDINGS: The patient is status post anterior fusion from L5-S1. The hardware
appears intact interbody spacers noted at the L5-S1. Additional
images of the lumbar spine demonstrate instrumentation at the L4-L5
level.
IMPRESSION: Status post anterior lumbar fusion from L5-S1 and posterior
discectomy at the L4-L5 level.

## 2021-01-07 IMAGING — CR OR LOCAL ABDOMEN
1 series · 1 of 1 positions shown · non-contrast
Comparison: Intraoperative views same day. Abdominal CT 12/30/2016.

CLINICAL DATA: L5-S1 ALIF.  Incorrect instrument count.

EXAM:
OR LOCAL ABDOMEN

[AP]
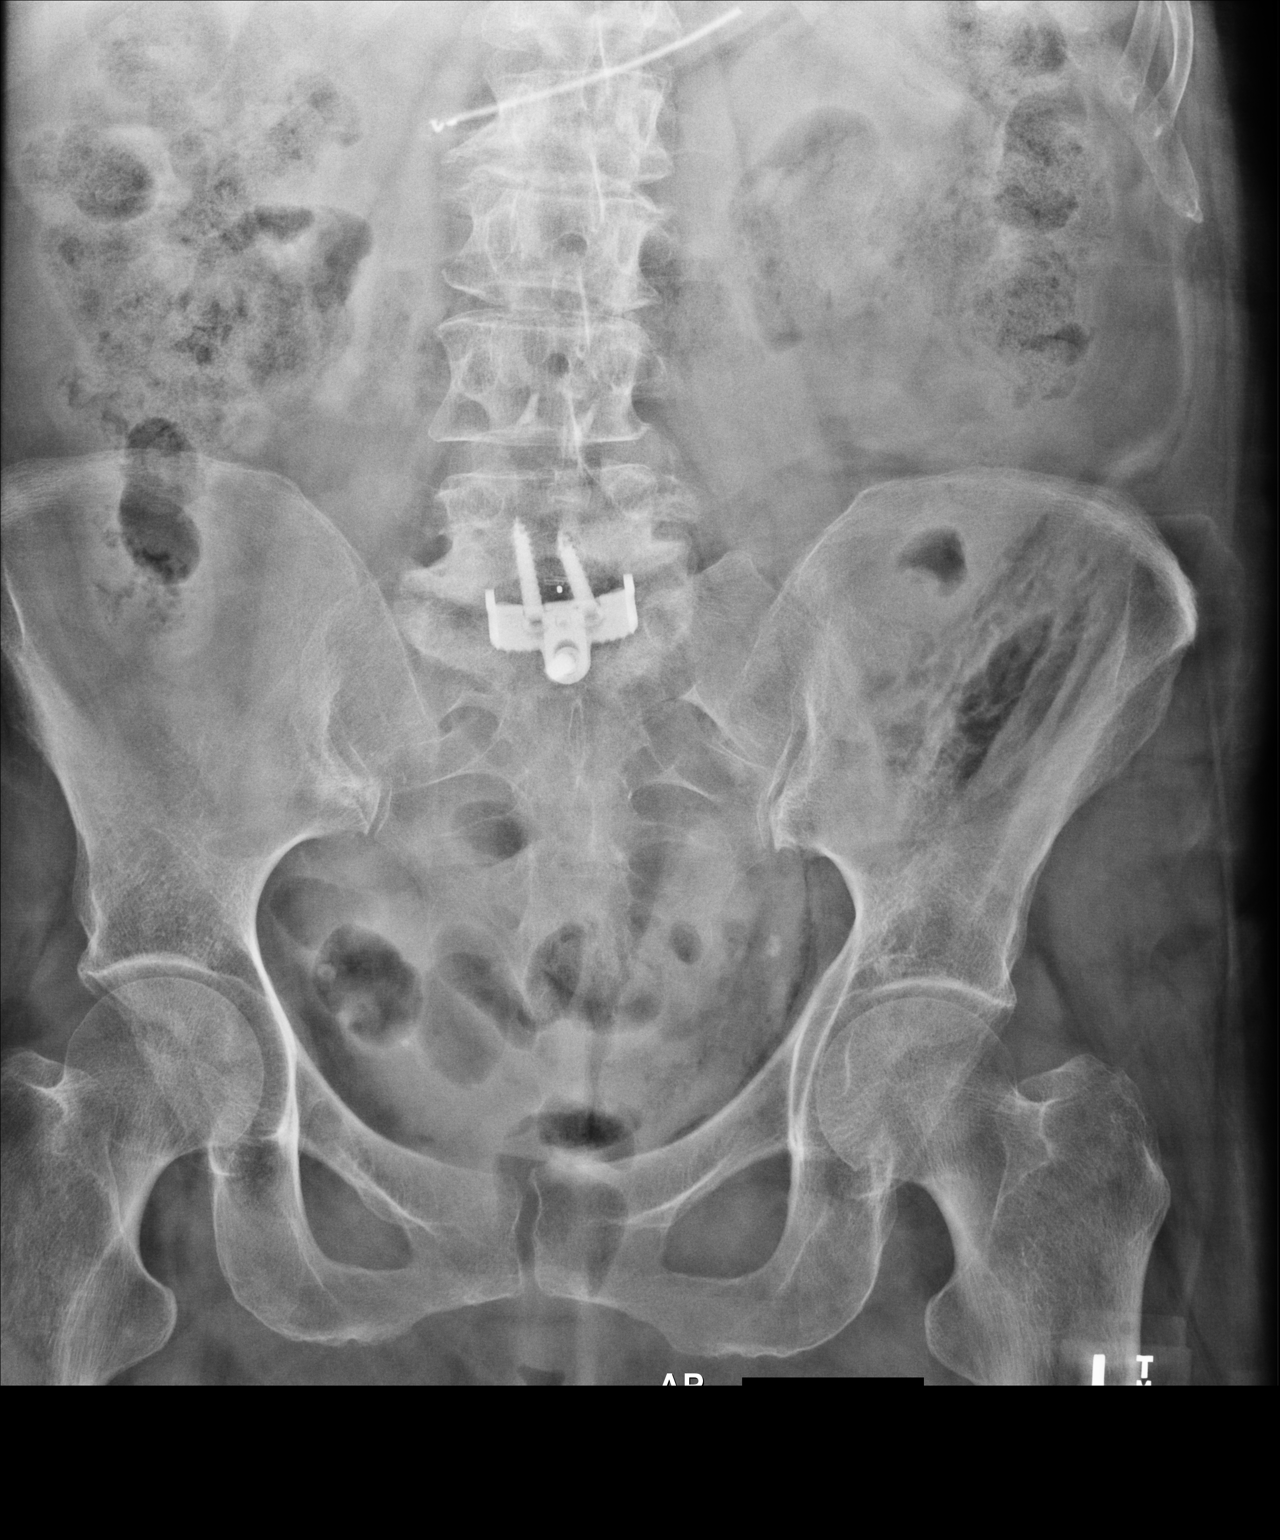

[1 of 1 positions shown; findings below may reference images not displayed]

FINDINGS: 4445 hours. Expected postsurgical changes from L5-S1 anterior lumbar
interbody fusion. No retained surgical instrument identified.
Enteric tube noted overlying the upper lumbar spine. The visualized
bowel gas pattern is normal.
IMPRESSION: No evidence of retained surgical instrument following L5-S1 ALIF.
These results were called by telephone at the time of interpretation

## 2021-01-12 DIAGNOSIS — F419 Anxiety disorder, unspecified: Secondary | ICD-10-CM | POA: Diagnosis not present

## 2021-01-20 ENCOUNTER — Ambulatory Visit (INDEPENDENT_AMBULATORY_CARE_PROVIDER_SITE_OTHER): Payer: PPO | Admitting: Adult Health

## 2021-01-20 ENCOUNTER — Encounter: Payer: Self-pay | Admitting: Adult Health

## 2021-01-20 ENCOUNTER — Other Ambulatory Visit: Payer: Self-pay

## 2021-01-20 ENCOUNTER — Other Ambulatory Visit (HOSPITAL_COMMUNITY)
Admission: RE | Admit: 2021-01-20 | Discharge: 2021-01-20 | Disposition: A | Discharge status: Home / Self Care | Payer: PPO | Source: Ambulatory Visit | Attending: Adult Health | Admitting: Adult Health

## 2021-01-20 VITALS — BP 153/92 | HR 96 | Ht 64.5 in | Wt 144.0 lb

## 2021-01-20 DIAGNOSIS — R159 Full incontinence of feces: Secondary | ICD-10-CM

## 2021-01-20 DIAGNOSIS — Z08 Encounter for follow-up examination after completed treatment for malignant neoplasm: Secondary | ICD-10-CM

## 2021-01-20 DIAGNOSIS — Z1211 Encounter for screening for malignant neoplasm of colon: Secondary | ICD-10-CM | POA: Diagnosis not present

## 2021-01-20 DIAGNOSIS — Z90710 Acquired absence of both cervix and uterus: Secondary | ICD-10-CM | POA: Insufficient documentation

## 2021-01-20 DIAGNOSIS — Z923 Personal history of irradiation: Secondary | ICD-10-CM

## 2021-01-20 DIAGNOSIS — R102 Pelvic and perineal pain: Secondary | ICD-10-CM | POA: Diagnosis not present

## 2021-01-20 DIAGNOSIS — Z01419 Encounter for gynecological examination (general) (routine) without abnormal findings: Secondary | ICD-10-CM | POA: Diagnosis not present

## 2021-01-20 DIAGNOSIS — Z1151 Encounter for screening for human papillomavirus (HPV): Secondary | ICD-10-CM | POA: Insufficient documentation

## 2021-01-20 DIAGNOSIS — C530 Malignant neoplasm of endocervix: Secondary | ICD-10-CM

## 2021-01-20 LAB — HEMOCCULT GUIAC POC 1CARD (OFFICE): Fecal Occult Blood, POC: NEGATIVE

## 2021-01-20 NOTE — Progress Notes (Signed)
Patient ID: [name], female   DOB: 08-11-1955, 65 y.o.   MRN: [id number] History of Present Illness: [name] is a 65 year old white female, separated, sp hysterectomy, had cervical cancer and 5 HD vaginal radiation treatments. Has anal incontinence at times PCP [facility].   Current Medications, Allergies, Past Medical History, Past Surgical History, Family History and Social History were reviewed in [employer] record.     Review of Systems: Patient denies any headaches, hearing loss, fatigue, blurred vision, shortness of breath, chest pain, abdominal pain, problems with urination, or intercourse(not active). No joint pain or mood swings.  +anal incontinence at times   Physical Exam:BP (!) 153/92 (BP Location: Left Arm, Patient Position: Sitting, Cuff Size: Normal)   Pulse 96   Ht 5' 4.5" (1.638 m)   Wt 144 lb (65.3 kg)   BMI 24.34 kg/m   General:  Well developed, well nourished, no acute distress Skin:  Warm and dry Neck:  Midline trachea, normal thyroid, good ROM, no lymphadenopathy,no carotid bruits heard Lungs; Clear to auscultation bilaterally Breast:  No dominant palpable mass, retraction, or nipple discharge Cardiovascular: Regular rate and rhythm Abdomen:  Soft, non tender, no hepatosplenomegaly Pelvic:  External genitalia is normal in appearance, no lesions.Has blackheads right groin.  The vagina is pale, no lesions seen, vaginal pap with HR HPV genotyping performed, Urethra has no lesions or masses. The cervix and uterus are absent. No adnexal masses, mild tenderness noted.Bladder is mildly tender, no masses felt. Rectal: Good sphincter tone, no polyps, or hemorrhoids felt.  Hemoccult negative. Extremities/musculoskeletal:  No swelling or varicosities noted, no clubbing or cyanosis Psych:  No mood changes, alert and cooperative,seems happy AA is 0  Fall risk is low Depression screen [facility] 2/9 01/20/2021 01/17/2020 11/23/2017  Decreased Interest 3 1 1    Down, Depressed, Hopeless 3 0 0  PHQ - 2 Score 6 1 1   Altered sleeping 3 - -  Tired, decreased energy 3 - -  Change in appetite 3 - -  Feeling bad or failure about yourself  1 - -  Trouble concentrating 2 - -  Moving slowly or fidgety/restless 3 - -  Suicidal thoughts 0 - -  PHQ-9 Score 21 - -   On meds GAD 7 : Generalized Anxiety Score 01/20/2021  Nervous, Anxious, on Edge 3  Control/stop worrying 3  Worry too much - different things 3  Trouble relaxing 3  Restless 3  Easily annoyed or irritable 3  Afraid - awful might happen 3  Total GAD 7 Score 21    Upstream - 01/20/21 1440       Pregnancy Intention Screening   Does the patient want to become pregnant in the next year? No    Does the patient's partner want to become pregnant in the next year? No    Would the patient like to discuss contraceptive options today? No      Contraception Wrap Up   Current Method Female Sterilization   hyst   End Method Female Sterilization    Contraception Counseling Provided No            Examination chaperoned by Glenard Haring RN    Impression and Plan:  1. Encounter for vaginal Papanicolaou smear following hysterectomy for malignancy Vaginal pap sent Pap in 1 year Physical with me or PCP Labs with PCP Get mammogram in near future  - Cytology - PAP  2. Encounter for screening fecal occult blood testing  - POCT occult blood  stool  3. 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 S/P radiation therapy   5. Anal sphincter incontinence See GI, needs colonoscopy too   6. Tenderness of female pelvic organs [place] scheduled for her 01/22/21 at 3:30 pm at [place] - US PELVIC COMPLETE WITH TRANSVAGINAL; Future

## 2021-01-22 ENCOUNTER — Ambulatory Visit (HOSPITAL_COMMUNITY): Admission: RE | Admit: 2021-01-22 | Payer: PPO | Source: Ambulatory Visit

## 2021-01-22 ENCOUNTER — Ambulatory Visit (HOSPITAL_COMMUNITY): Payer: PPO

## 2021-01-22 DIAGNOSIS — I10 Essential (primary) hypertension: Secondary | ICD-10-CM | POA: Diagnosis not present

## 2021-01-22 DIAGNOSIS — F419 Anxiety disorder, unspecified: Secondary | ICD-10-CM | POA: Diagnosis not present

## 2021-01-22 DIAGNOSIS — Z733 Stress, not elsewhere classified: Secondary | ICD-10-CM | POA: Diagnosis not present

## 2021-01-23 LAB — CYTOLOGY - PAP
Comment: NEGATIVE
Diagnosis: NEGATIVE
High risk HPV: NEGATIVE

## 2021-04-08 DIAGNOSIS — F419 Anxiety disorder, unspecified: Secondary | ICD-10-CM | POA: Diagnosis not present

## 2021-04-08 DIAGNOSIS — Z79899 Other long term (current) drug therapy: Secondary | ICD-10-CM | POA: Diagnosis not present

## 2021-07-09 ENCOUNTER — Telehealth: Payer: Self-pay

## 2021-07-09 NOTE — Telephone Encounter (Signed)
Patient left voicemail inquiring about medical records and requests our office call her back at [phone]. ? ?Attempted to return patient call. No answer and no voicemail setup.  ?

## 2021-07-09 NOTE — Telephone Encounter (Signed)
Patient called back about medical records. Transferred patient call to medical records at [phone]. ?

## 2021-08-19 ENCOUNTER — Ambulatory Visit: Payer: PPO | Admitting: Gastroenterology

## 2021-08-19 ENCOUNTER — Encounter: Payer: Self-pay | Admitting: *Deleted

## 2021-08-19 ENCOUNTER — Encounter: Payer: Self-pay | Admitting: Gastroenterology

## 2021-08-19 VITALS — BP 122/84 | HR 96 | Temp 97.3°F | Ht 66.0 in | Wt 136.0 lb

## 2021-08-19 DIAGNOSIS — R109 Unspecified abdominal pain: Secondary | ICD-10-CM

## 2021-08-19 DIAGNOSIS — N321 Vesicointestinal fistula: Secondary | ICD-10-CM

## 2021-08-19 DIAGNOSIS — Z8541 Personal history of malignant neoplasm of cervix uteri: Secondary | ICD-10-CM

## 2021-08-19 DIAGNOSIS — K641 Second degree hemorrhoids: Secondary | ICD-10-CM

## 2021-08-19 DIAGNOSIS — Z1211 Encounter for screening for malignant neoplasm of colon: Secondary | ICD-10-CM | POA: Diagnosis not present

## 2021-08-19 DIAGNOSIS — K649 Unspecified hemorrhoids: Secondary | ICD-10-CM | POA: Insufficient documentation

## 2021-08-19 MED ORDER — PEG 3350-KCL-NA BICARB-NACL 420 G PO SOLR: ORAL | 0 refills | Status: DC

## 2021-08-19 NOTE — H&P (View-Only) (Signed)
Gastroenterology Office Note     Primary Care Physician:  [name], MD  Primary Gastroenterologist: Dr. Gala Romney    Chief Complaint   Chief Complaint  Patient presents with   Diarrhea    BM's coming out in piles     History of Present Illness   [name] is a 66 y.o. female presenting today in follow-up with a history of constipation historically, last seen in 2020 with recommendations for initial screening colonoscopy. However, she canceled this. She has had a difficult experience in the past with her husband undergoing a colonoscopy by someone else and what sounds like a perforation. She also notes a dear friend of hers recently succumbed to colon cancer. She is nervous today and desires to pursue colonoscopy. She does have a history significant for cervical cancer s/p 5  radiation treatments several years ago.   States her bowel movements "pile up" but are not diarrhea.  Will have large piles of stool. Sometimes long and skinny BMs. Sometimes short. Sometimes coughs and has incontinence. When urinating, believes she has seen some stool and mucus when wiping. Some rectal discomfort at times. No bleeding. Prolapsing tissue at times. Chronic lower abdominal discomfort with palpation, with GYN recommending [place] but has not been completed.   Past Medical History:  Diagnosis Date   Anxiety    Arthritis    spinal stenosis   Back disorder    "crooked spine"   Bulging lumbar disc    Cervical cancer (HCC)    Complication of anesthesia    ? hypotension 10/2016 at [facility] surgery    Depression    H/O degenerative disc disease    History of radiation therapy 02/22/17-03/22/17   vaginal cuff 30 Gy in 5 fractions   Hypertension    Hypothyroidism    patient taken off of hypothyroid med in 11/2016    Osteoporosis    Thyroid disease     Past Surgical History:  Procedure Laterality Date   ABDOMINAL EXPOSURE N/A 09/27/2018   Procedure: ABDOMINAL EXPOSURE;  Surgeon: Serafina Mitchell, MD;  [facility]: [facility];  Service: Vascular;  Laterality: N/A;   ANTERIOR AND POSTERIOR REPAIR N/A 11/09/2016   Procedure: ANTERIOR (CYSTOCELE) AND POSTERIOR REPAIR (RECTOCELE);  Surgeon: Jonnie Kind, MD;  Location: [facility];  Service: Gynecology;  Laterality: N/A;   ANTERIOR LUMBAR FUSION Left 09/27/2018   Procedure: ANTERIOR LUMBAR FUSION L5-S1,;  Surgeon: Melina Schools, MD;  Location: [place];  Service: Orthopedics;  Laterality: Left;  4.5 HRS/ DR. Trula Slade ASSIST   BILATERAL SALPINGECTOMY Left 11/09/2016   Procedure: LEFT SALPINGECTOMY;  Surgeon: Jonnie Kind, MD;  Location: [facility];  Service: Gynecology;  Laterality: Left;   CATARACT EXTRACTION W/PHACO Right 07/05/2016   Procedure: CATARACT EXTRACTION PHACO AND INTRAOCULAR LENS PLACEMENT (IOC);  Surgeon: Tonny Branch, MD;  Location: [facility];  Service: Ophthalmology;  Laterality: Right;  CDE: 15.41   CATARACT EXTRACTION W/PHACO Left 07/19/2016   Procedure: CATARACT EXTRACTION PHACO AND INTRAOCULAR LENS PLACEMENT LEFT EYE CDE= 12.65;  Surgeon: Tonny Branch, MD;  Location: [facility];  Service: Ophthalmology;  Laterality: Left;  left   LUMBAR LAMINECTOMY/DECOMPRESSION MICRODISCECTOMY Left 09/27/2018   Procedure: L4-L5 Lumbar Laminectomy/Decompression;  Surgeon: Melina Schools, MD;  Location: [place];  Service: Orthopedics;  Laterality: Left;   ROBOTIC PELVIC AND PARA-AORTIC LYMPH NODE DISSECTION N/A 01/11/2017   Procedure: XI ROBOTIC BILATERAL PELVIC LYMPH NODE DISSECTION;  Surgeon: [name], MD;  [facility]: [facility];  Service: Gynecology;  Laterality: N/A;   SALPINGOOPHORECTOMY Right 11/09/2016   Procedure: RIGHT SALPINGO OOPHORECTOMY;  Surgeon: Jonnie Kind, MD;  Location: [facility];  Service: Gynecology;  Laterality: Right;   TUBAL LIGATION     VAGINAL HYSTERECTOMY N/A 11/09/2016   Procedure: HYSTERECTOMY VAGINAL;  Surgeon: Jonnie Kind, MD;  [facility]: [facility];  Service: Gynecology;  Laterality: N/A;    Current Outpatient Medications  Medication  Sig Dispense Refill   albuterol (VENTOLIN HFA) 108 (90 Base) MCG/ACT inhaler Inhale 1-2 puffs into the lungs every 6 (six) hours as needed for wheezing or shortness of breath. 18 g 0   alendronate (FOSAMAX) 70 MG tablet Take 1 tablet by mouth once a week.     ALPRAZolam (XANAX) 1 MG tablet Take 1 mg by mouth 2 (two) times daily.     amLODipine (NORVASC) 10 MG tablet Take 10 mg by mouth daily.     atorvastatin (LIPITOR) 40 MG tablet Take 40 mg by mouth at bedtime.      lisinopril (PRINIVIL,ZESTRIL) 40 MG tablet Take 40 mg by mouth daily with breakfast.      sertraline (ZOLOFT) 100 MG tablet Take 100 mg by mouth daily.     traZODone (DESYREL) 100 MG tablet Take 100 mg by mouth at bedtime.     vitamin B-12 (CYANOCOBALAMIN) 500 MCG tablet Take 1,000 mcg by mouth daily.     No current facility-administered medications for this visit.    Allergies as of 08/19/2021 - Review Complete 08/19/2021  Allergen Reaction Noted   Zithromax [azithromycin] Rash and Other (See Comments) 01/06/2016   Prednisone Nausea Only 01/06/2016    Family History  Problem Relation Age of Onset   Hypertension Mother    Congenital heart disease Mother    Diabetes Mother    Thyroid disease Brother    Other Daughter        bowel issues   Colon cancer Neg Hx    Colon polyps Neg Hx     Social History   Socioeconomic History   Marital status: Legally Separated    Spouse name: Not on file   Number of children: 1   Years of education: Not on file   Highest education level: Not on file  Occupational History   Not on file  Tobacco Use   Smoking status: Every Day    Packs/day: 1.00    Years: 44.00    Pack years: 44.00    Types: Cigarettes   Smokeless tobacco: Never  Vaping Use   Vaping Use: Never used  Substance and Sexual Activity   Alcohol use: No    Comment: 01-06-2016 Per pt rarely, 02-05-2016 per pt no but 80yrs ago     Drug use: No    Comment: 02-05-2016 per pt no but about 40 yrs ago   Sexual  activity: Not Currently    Birth control/protection: Surgical    Comment: hyst  Other Topics Concern   Not on file  Social History Narrative   Not on file   Social Determinants of Health   Financial Resource Strain: Unknown   Difficulty of Paying Living Expenses: Patient refused  Food Insecurity: No Food Insecurity   Worried About Running Out of Food in the Last Year: Never true   Ran Out of Food in the Last Year: Never true  Transportation Needs: No Transportation Needs   Lack of Transportation (Medical): No   Lack of Transportation (Non-Medical): No  Physical Activity: Inactive   Days of Exercise per Week:  1 day   Minutes of Exercise per Session: 0 min  Stress: Stress Concern Present   Feeling of Stress : Very much  Social Connections: Moderately Isolated   Frequency of Communication with Friends and Family: Twice a week   Frequency of Social Gatherings with Friends and Family: Never   Attends Religious Services: More than 4 times per year   Active Member of [employer] or Organizations: No   Attends Music therapist: Never   Marital Status: Married  Human resources officer Violence: Not At Risk   Fear of Current or Ex-Partner: No   Emotionally Abused: No   Physically Abused: No   Sexually Abused: No     Review of Systems   Gen: Denies any fever, chills, fatigue, weight loss, lack of appetite.  CV: Denies chest pain, heart palpitations, peripheral edema, syncope.  Resp: Denies shortness of breath at rest or with exertion. Denies wheezing or cough.  GI: see HPI GU : Denies urinary burning, urinary frequency, urinary hesitancy MS: Denies joint pain, muscle weakness, cramps, or limitation of movement.  Derm: Denies rash, itching, dry skin Psych: Denies depression, anxiety, memory loss, and confusion Heme: Denies bruising, bleeding, and enlarged lymph nodes.   Physical Exam   BP 122/84   Pulse 96   Temp (!) 97.3 F (36.3 C)   Ht 5\' 6"  (1.676 m)   Wt 136 lb  (61.7 kg)   BMI 21.95 kg/m  General:   Alert and oriented. Pleasant and cooperative. Well-nourished and well-developed.  Head:  Normocephalic and atraumatic. Eyes:  Without icterus Cardiac: S1 S2 present without murmurs Lungs: clear bilaterally Abdomen:  +BS, soft, mild TTP LLQ without rebound and non-distended. No HSM noted. No guarding or rebound. No masses appreciated.  Rectal:  no external hemorrhoids or prolapsing tissue. DRE without mass. No pain. Lax sphincter tone Msk:  Symmetrical without gross deformities. Normal posture. Extremities:  Without edema. Neurologic:  Alert and  oriented x4;  grossly normal neurologically. Skin:  Intact without significant lesions or rashes. Psych:  Alert and cooperative. Normal mood and affect.   Assessment   [name] is a 66 y.o. female presenting today in follow-up with a history of constipation historically, last seen in 2020 with recommendations for initial screening colonoscopy. Returns today to arrange.   She is noting symptoms consistent with likely Grade 2 hemorrhoids, including prolapse and pressure but no rectal bleeding. Alternating bowel habits with unproductive stools at times and occasional fecal incontinence. She does have history of cervical cancer s/p 5 rounds of radiation several years ago. Lax sphincter tone on exam. Interesting report of wiping stool with urinating at times; it is unclear if this is simply soiling or actually due to possible colovesical fistula?Marland KitchenUnable to rule out radiation-induced injury causing incontinence episodes. No rectal bleeding. She will need to return to GYN due to history of cervical cancer and was to have pelvic ultrasound. I am referring back to GYN to help facilitate this.  In interim, I have recommended to start taking fiber daily. We will arrange for initial screening colonoscopy. No family history of colorectal cancer or polyps.   PLAN    Proceed with colonoscopy by Dr. Gala Romney in near  future: the risks, benefits, and alternatives have been discussed with the patient in detail. The patient states understanding and desires to proceed. Refer back to GYN Trial of supplemental fiber daily Further recommendations to follow  Consider hemorrhoid banding as appropriate   Annitta Needs, PhD, ANP-BC [facility]  Gastroenterology

## 2021-08-19 NOTE — Progress Notes (Signed)
? ? ?Gastroenterology Office Note   ? ? ?Primary Care Physician:  [name], MD  ?Primary Gastroenterologist: Dr. Gala Romney  ? ? ?Chief Complaint  ? ?Chief Complaint  ?Patient presents with  ? Diarrhea  ?  BM's coming out in piles  ? ? ? ?History of Present Illness  ? ?[name] is a 66 y.o. female presenting today in follow-up with a history of constipation historically, last seen in 2020 with recommendations for initial screening colonoscopy. However, she canceled this. She has had a difficult experience in the past with her husband undergoing a colonoscopy by someone else and what sounds like a perforation. She also notes a dear friend of hers recently succumbed to colon cancer. She is nervous today and desires to pursue colonoscopy. She does have a history significant for cervical cancer s/p 5  radiation treatments several years ago.  ? ?States her bowel movements "pile up" but are not diarrhea.  Will have large piles of stool. Sometimes long and skinny BMs. Sometimes short. Sometimes coughs and has incontinence. When urinating, believes she has seen some stool and mucus when wiping. Some rectal discomfort at times. No bleeding. Prolapsing tissue at times. Chronic lower abdominal discomfort with palpation, with GYN recommending [place] but has not been completed.  ? ?Past Medical History:  ?Diagnosis Date  ? Anxiety   ? Arthritis   ? spinal stenosis  ? Back disorder   ? "crooked spine"  ? Bulging lumbar disc   ? Cervical cancer ([place])   ? Complication of anesthesia   ? ? hypotension 10/2016 at [facility] surgery   ? Depression   ? H/O degenerative disc disease   ? History of radiation therapy 02/22/17-03/22/17  ? vaginal cuff 30 Gy in 5 fractions  ? Hypertension   ? Hypothyroidism   ? patient taken off of hypothyroid med in 11/2016   ? Osteoporosis   ? Thyroid disease   ? ? ?Past Surgical History:  ?Procedure Laterality Date  ? ABDOMINAL EXPOSURE N/A 09/27/2018  ? Procedure: ABDOMINAL EXPOSURE;  Surgeon: Serafina Mitchell, MD;  Location: [facility] OR;  Service: Vascular;  Laterality: N/A;  ? ANTERIOR AND POSTERIOR REPAIR N/A 11/09/2016  ? Procedure: ANTERIOR (CYSTOCELE) AND POSTERIOR REPAIR (RECTOCELE);  Surgeon: Jonnie Kind, MD;  [facility];  Service: Gynecology;  Laterality: N/A;  ? ANTERIOR LUMBAR FUSION Left 09/27/2018  ? Procedure: ANTERIOR LUMBAR FUSION L5-S1,;  Surgeon: Melina Schools, MD;  Location: [place];  Service: Orthopedics;  Laterality: Left;  4.5 HRS/ DR. Trula Slade ASSIST  ? BILATERAL SALPINGECTOMY Left 11/09/2016  ? Procedure: LEFT SALPINGECTOMY;  Surgeon: Jonnie Kind, MD;  [facility]: [facility];  Service: Gynecology;  Laterality: Left;  ? CATARACT EXTRACTION W/PHACO Right 07/05/2016  ? Procedure: CATARACT EXTRACTION PHACO AND INTRAOCULAR LENS PLACEMENT (IOC);  Surgeon: Tonny Branch, MD;  [facility]: [facility];  Service: Ophthalmology;  Laterality: Right;  CDE: 15.41  ? CATARACT EXTRACTION W/PHACO Left 07/19/2016  ? Procedure: CATARACT EXTRACTION PHACO AND INTRAOCULAR LENS PLACEMENT LEFT EYE CDE= 12.65;  Surgeon: Tonny Branch, MD;  [facility];  Service: Ophthalmology;  Laterality: Left;  left  ? LUMBAR LAMINECTOMY/DECOMPRESSION MICRODISCECTOMY Left 09/27/2018  ? Procedure: L4-L5 Lumbar Laminectomy/Decompression;  Surgeon: Melina Schools, MD;  Location: [place];  Service: Orthopedics;  Laterality: Left;  ? ROBOTIC PELVIC AND PARA-AORTIC LYMPH NODE DISSECTION N/A 01/11/2017  ? Procedure: XI ROBOTIC BILATERAL PELVIC LYMPH NODE DISSECTION;  Surgeon: [name], MD;  [facility];  Service: Gynecology;  Laterality: N/A;  ? SALPINGOOPHORECTOMY Right 11/09/2016  ? Procedure: RIGHT SALPINGO OOPHORECTOMY;  Surgeon: Jonnie Kind, MD;  [facility]: [facility];  Service: Gynecology;  Laterality: Right;  ? TUBAL LIGATION    ? VAGINAL HYSTERECTOMY N/A 11/09/2016  ? Procedure: HYSTERECTOMY VAGINAL;  Surgeon: Jonnie Kind, MD;  [facility]: [facility];  Service: Gynecology;  Laterality: N/A;  ? ? ?Current Outpatient Medications  ?Medication  Sig Dispense Refill  ? albuterol (VENTOLIN HFA) 108 (90 Base) MCG/ACT inhaler Inhale 1-2 puffs into the lungs every 6 (six) hours as needed for wheezing or shortness of breath. 18 g 0  ? alendronate (FOSAMAX) 70 MG tablet Take 1 tablet by mouth once a week.    ? ALPRAZolam (XANAX) 1 MG tablet Take 1 mg by mouth 2 (two) times daily.    ? amLODipine (NORVASC) 10 MG tablet Take 10 mg by mouth daily.    ? atorvastatin (LIPITOR) 40 MG tablet Take 40 mg by mouth at bedtime.     ? lisinopril (PRINIVIL,ZESTRIL) 40 MG tablet Take 40 mg by mouth daily with breakfast.     ? sertraline (ZOLOFT) 100 MG tablet Take 100 mg by mouth daily.    ? traZODone (DESYREL) 100 MG tablet Take 100 mg by mouth at bedtime.    ? vitamin B-12 (CYANOCOBALAMIN) 500 MCG tablet Take 1,000 mcg by mouth daily.    ? ?No current facility-administered medications for this visit.  ? ? ?Allergies as of 08/19/2021 - Review Complete 08/19/2021  ?Allergen Reaction Noted  ? Zithromax [azithromycin] Rash and Other (See Comments) 01/06/2016  ? Prednisone Nausea Only 01/06/2016  ? ? ?Family History  ?Problem Relation Age of Onset  ? Hypertension Mother   ? Congenital heart disease Mother   ? Diabetes Mother   ? Thyroid disease Brother   ? Other Daughter   ?     bowel issues  ? Colon cancer Neg Hx   ? Colon polyps Neg Hx   ? ? ?Social History  ? ?Socioeconomic History  ? Marital status: Legally Separated  ?  Spouse name: Not on file  ? Number of children: 1  ? Years of education: Not on file  ? Highest education level: Not on file  ?Occupational History  ? Not on file  ?Tobacco Use  ? Smoking status: Every Day  ?  Packs/day: 1.00  ?  Years: 44.00  ?  Pack years: 44.00  ?  Types: Cigarettes  ? Smokeless tobacco: Never  ?Vaping Use  ? Vaping Use: Never used  ?Substance and Sexual Activity  ? Alcohol use: No  ?  Comment: 01-06-2016 Per pt rarely, 02-05-2016 per pt no but 70yrs ago    ? Drug use: No  ?  Comment: 02-05-2016 per pt no but about 40 yrs ago  ? Sexual  activity: Not Currently  ?  Birth control/protection: Surgical  ?  Comment: hyst  ?Other Topics Concern  ? Not on file  ?Social History Narrative  ? Not on file  ? ?Social Determinants of Health  ? ?Financial Resource Strain: Unknown  ? Difficulty of Paying Living Expenses: Patient refused  ?Food Insecurity: No Food Insecurity  ? Worried About Charity fundraiser in the Last Year: Never true  ? Ran Out of Food in the Last Year: Never true  ?Transportation Needs: No Transportation Needs  ? Lack of Transportation (Medical): No  ? Lack of Transportation (Non-Medical): No  ?Physical Activity: Inactive  ? Days of Exercise per Week:  1 day  ? Minutes of Exercise per Session: 0 min  ?Stress: Stress Concern Present  ? Feeling of Stress : Very much  ?Social Connections: Moderately Isolated  ? Frequency of Communication with Friends and Family: Twice a week  ? Frequency of Social Gatherings with Friends and Family: Never  ? Attends Religious Services: More than 4 times per year  ? Active Member of Clubs or Organizations: No  ? Attends Archivist Meetings: Never  ? Marital Status: Married  ?Intimate Partner Violence: Not At Risk  ? Fear of Current or Ex-Partner: No  ? Emotionally Abused: No  ? Physically Abused: No  ? Sexually Abused: No  ? ? ? ?Review of Systems  ? ?Gen: Denies any fever, chills, fatigue, weight loss, lack of appetite.  ?CV: Denies chest pain, heart palpitations, peripheral edema, syncope.  ?Resp: Denies shortness of breath at rest or with exertion. Denies wheezing or cough.  ?GI: see HPI ?GU : Denies urinary burning, urinary frequency, urinary hesitancy ?MS: Denies joint pain, muscle weakness, cramps, or limitation of movement.  ?Derm: Denies rash, itching, dry skin ?Psych: Denies depression, anxiety, memory loss, and confusion ?Heme: Denies bruising, bleeding, and enlarged lymph nodes. ? ? ?Physical Exam  ? ?BP 122/84   Pulse 96   Temp (!) 97.3 ?F (36.3 ?C)   Ht 5\' 6"  (1.676 m)   Wt 136 lb  (61.7 kg)   BMI 21.95 kg/m?  ?General:   Alert and oriented. Pleasant and cooperative. Well-nourished and well-developed.  ?Head:  Normocephalic and atraumatic. ?Eyes:  Without icterus ?Cardiac: S1 S2 present withou

## 2021-08-19 NOTE — Patient Instructions (Signed)
We are arranging a colonoscopy by Dr. Gala Romney in the near future! ? ?I am referring you back to GYN for evaluation.  ? ?Start taking Benefiber daily, 2 teaspoons in the beverage of your choice. ? ?You may be a good candidate for hemorrhoid banding in the future! ? ?Further recommendations to follow! ? ?It was a pleasure to see you today. I want to create trusting relationships with patients to provide genuine, compassionate, and quality care. I value your feedback. If you receive a survey regarding your visit,  I greatly appreciate you taking time to fill this out.  ? ?Annitta Needs, PhD, ANP-BC ?[facility]  ? ?

## 2021-08-26 ENCOUNTER — Ambulatory Visit: Payer: PPO | Admitting: Adult Health

## 2021-08-26 ENCOUNTER — Encounter: Payer: Self-pay | Admitting: Adult Health

## 2021-08-26 VITALS — BP 144/95 | HR 97 | Ht 66.0 in | Wt 136.0 lb

## 2021-08-26 DIAGNOSIS — R102 Pelvic and perineal pain: Secondary | ICD-10-CM | POA: Diagnosis not present

## 2021-08-26 DIAGNOSIS — C530 Malignant neoplasm of endocervix: Secondary | ICD-10-CM

## 2021-08-26 DIAGNOSIS — Z923 Personal history of irradiation: Secondary | ICD-10-CM | POA: Diagnosis not present

## 2021-08-26 DIAGNOSIS — Z90710 Acquired absence of both cervix and uterus: Secondary | ICD-10-CM | POA: Insufficient documentation

## 2021-08-26 NOTE — Progress Notes (Signed)
?  Subjective:  ?  ? Patient ID: [name], female   DOB: Apr 30, 1955, 66 y.o.   MRN: [id number] ? ?HPI ?[name] is a 66 year old white female,separated,sp hysterectomy, had cervical cancer and had 5 HD vaginal radiation treatments. Has pelvic tenderness and was supposed to get pelvic US in October and did not, wants to get rescheduled. ? ?Lab Results  ?Component Value Date  ? DIAGPAP  01/20/2021  ?  - Negative for intraepithelial lesion or malignancy (NILM)  ? HPV NOT Detected 12/12/2018  ? [place] Negative 01/20/2021  ? PCP is Dr Maudie Mercury. ? ?Review of Systems ?Has pelvic tenderness, did not get pelvic US ordered in October  ?Has anal incontinence at times, has seen Dr Gala Romney, colonoscopy scheduled for  5/3/123 ?Reviewed past medical,surgical, social and family history. Reviewed medications and allergies.  ?   ?Objective:  ? Physical Exam ?BP (!) 144/95 (BP Location: Left Arm, Patient Position: Sitting, Cuff Size: Normal)   Pulse 97   Ht 5\' 6"  (1.676 m)   Wt 136 lb (61.7 kg)   BMI 21.95 kg/m?   ?  Skin warm and dry.  Lungs: clear to ausculation bilaterally. Cardiovascular: regular rate and rhythm.  ?+tenderness over pelvic area, declined internal exam ? [place] - 08/26/21 1108   ? ?  ? Pregnancy Intention Screening  ? Does the patient want to become pregnant in the next year? N/A   ? Does the patient's partner want to become pregnant in the next year? N/A   ? Would the patient like to discuss contraceptive options today? N/A   ?  ? Contraception Wrap Up  ? Current Method Female Sterilization   hyst  ? End Method Female Sterilization   hyst  ? Contraception Counseling Provided No   ? ?  ?  ? ?  ?  ?Assessment:  ?   ?1. Tenderness of female pelvic organs ?Has pelvic US scheduled for 09/08/21 at [facility] at 3:30 pm to assess ovaries  ? ?2. S/P radiation therapy ? ?3. S/P hysterectomy ? ? ?4. 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 ?   ?Plan:  ?  Will talk when [place] results back   ?Pap and physical in October ?   ?

## 2021-08-27 ENCOUNTER — Telehealth: Payer: Self-pay

## 2021-08-27 DIAGNOSIS — Z1211 Encounter for screening for malignant neoplasm of colon: Secondary | ICD-10-CM

## 2021-08-27 NOTE — Telephone Encounter (Signed)
[name] at [employer] called office, pt needs BMET prior to procedure scheduled for 09/09/21 due to taking HCTZ.  Called pt, advised her to go to [facility] lab 09/04/21 for blood work.   Lab letter mailed. BMET order entered.

## 2021-09-04 ENCOUNTER — Other Ambulatory Visit (HOSPITAL_COMMUNITY)
Admission: RE | Admit: 2021-09-04 | Discharge: 2021-09-04 | Disposition: A | Discharge status: Home / Self Care | Payer: PPO | Source: Ambulatory Visit | Attending: Internal Medicine | Admitting: Internal Medicine

## 2021-09-04 DIAGNOSIS — Z1211 Encounter for screening for malignant neoplasm of colon: Secondary | ICD-10-CM | POA: Insufficient documentation

## 2021-09-04 LAB — BASIC METABOLIC PANEL
Anion gap: 8 (ref 5–15)
BUN: 21 mg/dL (ref 8–23)
CO2: 26 mmol/L (ref 22–32)
Calcium: 9.3 mg/dL (ref 8.9–10.3)
Chloride: 102 mmol/L (ref 98–111)
Creatinine, Ser: 1.41 mg/dL — ABNORMAL HIGH (ref 0.44–1.00)
GFR, Estimated: 41 mL/min — ABNORMAL LOW (ref >60)
Glucose, Bld: 112 mg/dL — ABNORMAL HIGH (ref 70–99)
Potassium: 4 mmol/L (ref 3.5–5.1)
Sodium: 136 mmol/L (ref 135–145)

## 2021-09-08 ENCOUNTER — Ambulatory Visit (HOSPITAL_COMMUNITY): Payer: PPO

## 2021-09-09 ENCOUNTER — Ambulatory Visit (HOSPITAL_BASED_OUTPATIENT_CLINIC_OR_DEPARTMENT_OTHER): Payer: PPO | Admitting: Anesthesiology

## 2021-09-09 ENCOUNTER — Encounter (HOSPITAL_COMMUNITY)
Admission: RE | Disposition: A | Discharge status: Home / Self Care | Payer: Self-pay | Source: Home / Self Care | Attending: Internal Medicine

## 2021-09-09 ENCOUNTER — Ambulatory Visit (HOSPITAL_COMMUNITY): Payer: PPO | Admitting: Anesthesiology

## 2021-09-09 ENCOUNTER — Other Ambulatory Visit: Payer: Self-pay

## 2021-09-09 ENCOUNTER — Ambulatory Visit (HOSPITAL_COMMUNITY)
Admission: RE | Admit: 2021-09-09 | Discharge: 2021-09-09 | Disposition: A | Discharge status: Home / Self Care | Payer: PPO | Attending: Internal Medicine | Admitting: Internal Medicine

## 2021-09-09 ENCOUNTER — Encounter (HOSPITAL_COMMUNITY): Payer: Self-pay | Admitting: Internal Medicine

## 2021-09-09 DIAGNOSIS — Z1211 Encounter for screening for malignant neoplasm of colon: Secondary | ICD-10-CM | POA: Diagnosis not present

## 2021-09-09 DIAGNOSIS — Z923 Personal history of irradiation: Secondary | ICD-10-CM | POA: Diagnosis not present

## 2021-09-09 DIAGNOSIS — I10 Essential (primary) hypertension: Secondary | ICD-10-CM | POA: Insufficient documentation

## 2021-09-09 DIAGNOSIS — K641 Second degree hemorrhoids: Secondary | ICD-10-CM | POA: Diagnosis not present

## 2021-09-09 DIAGNOSIS — Z8541 Personal history of malignant neoplasm of cervix uteri: Secondary | ICD-10-CM | POA: Insufficient documentation

## 2021-09-09 DIAGNOSIS — K635 Polyp of colon: Secondary | ICD-10-CM | POA: Diagnosis not present

## 2021-09-09 DIAGNOSIS — E039 Hypothyroidism, unspecified: Secondary | ICD-10-CM | POA: Diagnosis not present

## 2021-09-09 DIAGNOSIS — F17210 Nicotine dependence, cigarettes, uncomplicated: Secondary | ICD-10-CM | POA: Insufficient documentation

## 2021-09-09 HISTORY — PX: COLONOSCOPY WITH PROPOFOL: SHX5780

## 2021-09-09 HISTORY — PX: POLYPECTOMY: SHX5525

## 2021-09-09 SURGERY — COLONOSCOPY WITH PROPOFOL
Anesthesia: General

## 2021-09-09 MED ORDER — LACTATED RINGERS IV SOLN: INTRAVENOUS | Status: DC

## 2021-09-09 MED ORDER — DENOSUMAB 60 MG/ML ~~LOC~~ SOSY: 60.0000 mg | PREFILLED_SYRINGE | Freq: Once | SUBCUTANEOUS | Status: DC

## 2021-09-09 MED ORDER — PROPOFOL 500 MG/50ML IV EMUL
INTRAVENOUS | Status: AC
  Filled 2021-09-09: qty 50

## 2021-09-09 MED ORDER — STERILE WATER FOR IRRIGATION IR SOLN
Status: DC | PRN
  Administered 2021-09-09: 60 mL

## 2021-09-09 MED ORDER — PROPOFOL 10 MG/ML IV BOLUS
INTRAVENOUS | Status: DC | PRN
  Administered 2021-09-09: 60 mg via INTRAVENOUS

## 2021-09-09 MED ORDER — PROPOFOL 500 MG/50ML IV EMUL
INTRAVENOUS | Status: DC | PRN
  Administered 2021-09-09: 150 ug/kg/min via INTRAVENOUS

## 2021-09-09 NOTE — Anesthesia Preprocedure Evaluation (Signed)
Anesthesia Evaluation  Patient identified by MRN, date of birth, ID band Patient awake    Reviewed: Allergy & Precautions, H&P , NPO status , Patient's Chart, lab work & pertinent test results, reviewed documented beta blocker date and time   Airway Mallampati: II  TM Distance: >3 FB Neck ROM: full    Dental no notable dental hx.    Pulmonary neg pulmonary ROS, Current Smoker,    Pulmonary exam normal breath sounds clear to auscultation       Cardiovascular Exercise Tolerance: Good hypertension, negative cardio ROS   Rhythm:regular Rate:Normal     Neuro/Psych PSYCHIATRIC DISORDERS Anxiety Depression negative neurological ROS     GI/Hepatic negative GI ROS, Neg liver ROS,   Endo/Other  Hypothyroidism   Renal/GU negative Renal ROS  negative genitourinary   Musculoskeletal   Abdominal   Peds  Hematology negative hematology ROS (+)   Anesthesia Other Findings   Reproductive/Obstetrics negative OB ROS                             Anesthesia Physical Anesthesia Plan  ASA: 2  Anesthesia Plan: General   Post-op Pain Management:    Induction:   PONV Risk Score and Plan: Propofol infusion  Airway Management Planned:   Additional Equipment:   Intra-op Plan:   Post-operative Plan:   Informed Consent: I have reviewed the patients History and Physical, chart, labs and discussed the procedure including the risks, benefits and alternatives for the proposed anesthesia with the patient or authorized representative who has indicated his/her understanding and acceptance.     Dental Advisory Given  Plan Discussed with: CRNA  Anesthesia Plan Comments:         Anesthesia Quick Evaluation

## 2021-09-09 NOTE — Op Note (Signed)
[facility] Patient Name: [name] Procedure Date: 09/09/2021 10:33 AM MRN: [id number] Date of Birth: February 01, 1956 Attending MD: Norvel Richards , MD CSN: [id number] Age: 66 Admit Type: Outpatient Procedure:                Colonoscopy Indications:              Screening for colorectal malignant neoplasm Providers:                Norvel Richards, MD, Caprice Kluver, Kristine L.                            Risa Grill, Technician Referring MD:              Medicines:                Propofol per Anesthesia Complications:            No immediate complications. Estimated Blood Loss:     Estimated blood loss: Minimal Procedure:                Pre-Anesthesia Assessment:                           - Prior to the procedure, a History and Physical                            was performed, and patient medications and                            allergies were reviewed. The patient's tolerance of                            previous anesthesia was also reviewed. The risks                            and benefits of the procedure and the sedation                            options and risks were discussed with the patient.                            All questions were answered, and informed consent                            was obtained. Prior Anticoagulants: The patient has                            taken no previous anticoagulant or antiplatelet                            agents. ASA Grade Assessment: II - A patient with                            mild systemic disease. After reviewing the risks  and benefits, the patient was deemed in                            satisfactory condition to undergo the procedure.                           After obtaining informed consent, the colonoscope                            was passed under direct vision. Throughout the                            procedure, the patient's blood pressure, pulse, and                             oxygen saturations were monitored continuously. The                            [id number]-HQ190L ([id number]) scope was introduced through                            the anus and advanced to the the cecum, identified                            by appendiceal orifice and ileocecal valve. The                            colonoscopy was performed without difficulty. The                            patient tolerated the procedure well. The quality                            of the bowel preparation was adequate. Scope In: 10:50:54 AM Scope Out: 11:03:47 AM Scope Withdrawal Time: 0 hours 9 minutes 34 seconds  Total Procedure Duration: 0 hours 12 minutes 53 seconds  Findings:      A 4 mm polyp was found in the sigmoid colon. The polyp was sessile. The       polyp was removed with a cold snare. Resection and retrieval were       complete. Estimated blood loss was minimal.      Non-bleeding hemorrhoids were found during retroflexion. The hemorrhoids       were moderate, medium-sized and Grade II (internal hemorrhoids that       prolapse but reduce spontaneously).      The exam was otherwise without abnormality on direct and retroflexion       views. Impression:               - One 4 mm polyp in the sigmoid colon, removed with                            a cold snare. Resected and retrieved.                           -  Non-bleeding hemorrhoids.                           - The examination was otherwise normal on direct                            and retroflexion views. No findings consistent with                            a fistula. Symptomatic hemorrhoids may be causing                            her perineal smearing. May benefit from hemorrhoid                            banding. Moderate Sedation:      Moderate (conscious) sedation was personally administered by an       anesthesia professional. The following parameters were monitored: oxygen       saturation, heart rate, blood pressure, respiratory  rate, EKG, adequacy       of pulmonary ventilation, and response to care. Recommendation:           - Patient has a contact number available for                            emergencies. The signs and symptoms of potential                            delayed complications were discussed with the                            patient. Return to normal activities tomorrow.                            Written discharge instructions were provided to the                            patient.                           - Advance diet as tolerated.                           - Continue present medications.                           - Repeat colonoscopy date to be determined after                            pending pathology results are reviewed for                            surveillance.                           - Return to GI office in 6 weeks. Resume Metamucil  daily. Pamphlet on hemorrhoid banding provided. Procedure Code(s):        --- Professional ---                           [phone], Colonoscopy, flexible; with removal of                            tumor(s), polyp(s), or other lesion(s) by snare                            technique Diagnosis Code(s):        --- Professional ---                           Z12.11, Encounter for screening for malignant                            neoplasm of colon                           K63.5, Polyp of colon                           K64.1, Second degree hemorrhoids CPT copyright 2019 [employer]. All rights reserved. The codes documented in this report are preliminary and upon coder review may  be revised to meet current compliance requirements. [name]. Amber Balderston, MD Norvel Richards, MD 09/09/2021 11:09:31 AM This report has been signed electronically. Number of Addenda: 0

## 2021-09-09 NOTE — Interval H&P Note (Signed)
History and Physical Interval Note:  09/09/2021 10:38 AM  [name]  has presented today for surgery, with the diagnosis of screening.  The various methods of treatment have been discussed with the patient and family. After consideration of risks, benefits and other options for treatment, the patient has consented to  Procedure(s) with comments: COLONOSCOPY WITH PROPOFOL (N/A) - 10:30am as a surgical intervention.  The patient's history has been reviewed, patient examined, no change in status, stable for surgery.  I have reviewed the patient's chart and labs.  Questions were answered to the patient's satisfaction.     [name]  There is no change.  Brief course of Metamucil help with all of her lower GI tract symptoms.  Colonoscopy today per plan.  I discussed the risk, benefits, limitations alternatives imponderables.  Questions answered.  Patient agreeable.

## 2021-09-09 NOTE — Discharge Instructions (Signed)
  Colonoscopy Discharge Instructions  Read the instructions outlined below and refer to this sheet in the next few weeks. These discharge instructions provide you with general information on caring for yourself after you leave the hospital. Your doctor may also give you specific instructions. While your treatment has been planned according to the most current medical practices available, unavoidable complications occasionally occur. If you have any problems or questions after discharge, call Dr. Gala Romney at [phone]. ACTIVITY You may resume your regular activity, but move at a slower pace for the next 24 hours.  Take frequent rest periods for the next 24 hours.  Walking will help get rid of the air and reduce the bloated feeling in your belly (abdomen).  No driving for 24 hours (because of the medicine (anesthesia) used during the test).   Do not sign any important legal documents or operate any machinery for 24 hours (because of the anesthesia used during the test).  NUTRITION Drink plenty of fluids.  You may resume your normal diet as instructed by your doctor.  Begin with a light meal and progress to your normal diet. Heavy or fried foods are harder to digest and may make you feel sick to your stomach (nauseated).  Avoid alcoholic beverages for 24 hours or as instructed.  MEDICATIONS You may resume your normal medications unless your doctor tells you otherwise.  WHAT YOU CAN EXPECT TODAY Some feelings of bloating in the abdomen.  Passage of more gas than usual.  Spotting of blood in your stool or on the toilet paper.  IF YOU HAD POLYPS REMOVED DURING THE COLONOSCOPY: No aspirin products for 7 days or as instructed.  No alcohol for 7 days or as instructed.  Eat a soft diet for the next 24 hours.  FINDING OUT THE RESULTS OF YOUR TEST Not all test results are available during your visit. If your test results are not back during the visit, make an appointment with your caregiver to find out the  results. Do not assume everything is normal if you have not heard from your caregiver or the medical facility. It is important for you to follow up on all of your test results.  SEEK IMMEDIATE MEDICAL ATTENTION IF: You have more than a spotting of blood in your stool.  Your belly is swollen (abdominal distention).  You are nauseated or vomiting.  You have a temperature over 101.  You have abdominal pain or discomfort that is severe or gets worse throughout the day.     1 small polyp removed from your colon today  You do have hemorrhoids likely affecting your ability to maintain good hygiene.  Further recommendations to follow pending review of pathology report  He may benefit from hemorrhoid banding  Pamphlet on hemorrhoid banding provided  Office visit with Roseanne Kaufman in 6 weeks  At patient request, I called Kathie Dike at [phone] findings and recommendations

## 2021-09-09 NOTE — Anesthesia Postprocedure Evaluation (Signed)
Anesthesia Post Note  Patient: [name]  Procedure(s) Performed: COLONOSCOPY WITH PROPOFOL POLYPECTOMY  Patient location during evaluation: Phase II Anesthesia Type: General Level of consciousness: awake Pain management: pain level controlled Vital Signs Assessment: post-procedure vital signs reviewed and stable Respiratory status: spontaneous breathing and respiratory function stable Cardiovascular status: blood pressure returned to baseline and stable Postop Assessment: no headache and no apparent nausea or vomiting Anesthetic complications: no Comments: Late entry   No notable events documented.   Last Vitals:  Vitals:   09/09/21 0931 09/09/21 1109  BP: 137/86 (!) 103/52  Pulse: 91 73  Resp: (!) 26 (!) 24  Temp: 36.8 C 36.7 C  SpO2: 92% 96%    Last Pain:  Vitals:   09/09/21 1109  TempSrc: Oral  PainSc: 0-No pain                 Louann Sjogren

## 2021-09-09 NOTE — Transfer of Care (Signed)
Immediate Anesthesia Transfer of Care Note  Patient: [name]  Procedure(s) Performed: COLONOSCOPY WITH PROPOFOL POLYPECTOMY  Patient Location: PACU  Anesthesia Type:General  Level of Consciousness: awake, alert  and oriented  Airway & Oxygen Therapy: Patient Spontanous Breathing  Post-op Assessment: Report given to RN, Post -op Vital signs reviewed and stable, Patient moving all extremities X 4 and Patient able to stick tongue midline  Post vital signs: Reviewed  Last Vitals:  Vitals Value Taken Time  BP 103/52 09/09/21 1109  Temp 36.7 C 09/09/21 1109  Pulse 73 09/09/21 1109  Resp 24 09/09/21 1109  SpO2 96 % 09/09/21 1109    Last Pain:  Vitals:   09/09/21 1109  TempSrc: Oral  PainSc: 0-No pain      Patients Stated Pain Goal: 9 (62/95/28 [id number])  Complications: No notable events documented.

## 2021-09-10 ENCOUNTER — Encounter: Payer: Self-pay | Admitting: Internal Medicine

## 2021-09-10 LAB — SURGICAL PATHOLOGY

## 2021-09-16 ENCOUNTER — Encounter (HOSPITAL_COMMUNITY): Payer: Self-pay | Admitting: Internal Medicine

## 2021-10-15 ENCOUNTER — Encounter: Payer: Self-pay | Admitting: Gastroenterology

## 2021-10-15 ENCOUNTER — Ambulatory Visit (INDEPENDENT_AMBULATORY_CARE_PROVIDER_SITE_OTHER): Payer: PPO | Admitting: Gastroenterology

## 2021-10-15 VITALS — BP 152/94 | HR 76 | Temp 97.1°F | Ht 66.0 in | Wt 138.2 lb

## 2021-10-15 DIAGNOSIS — K642 Third degree hemorrhoids: Secondary | ICD-10-CM

## 2021-10-15 NOTE — Progress Notes (Signed)
      [place] BANDING PROCEDURE NOTE  [name] is a 66 y.o. female presenting today for consideration of hemorrhoid banding. Last colonoscopy May 2023 with one 4 mm polyp in sigmoid, non-bleeding internal hemorrhoids. Main complaints of pressure, itching, soiling.   The patient presents with symptomatic grade 3 hemorrhoids, unresponsive to maximal medical therapy, requesting rubber band ligation of her hemorrhoidal disease. All risks, benefits, and alternative forms of therapy were described and informed consent was obtained.  Initially, banding of left lateral was attempted but insufficient tissue present when introducing the ligator to the appropriate depth. I then performed an anoscopic exam revealing grade 3 hemorrhoids most prominently in right anterior, followed by left lateral and right posterior. With her anatomy, the ligator will need to be positioned just prior to the tic mark depth in order to obtain adequate tissue so as not to "overshoot" the mark.   The decision was made to band the right anterior internal hemorrhoid, and the [place] was used to perform band ligation without complication. Digital anorectal examination was then performed to assure proper positioning of the band, and to adjust the banded tissue as required. The patient was discharged home without pain or other issues. Dietary and behavioral recommendations were given, along with follow-up instructions. The patient will return in several weeks for followup and possible additional banding as required.  No complications were encountered and the patient tolerated the procedure well.   Annitta Needs, PhD, ANP-BC [facility]

## 2021-10-15 NOTE — Patient Instructions (Signed)
Please continue to avoid straining.  You should limit your toilet time to 2-3 minutes at the most.   Continue to avoid constipation.  Please call me with any concerns or issues!  I will see you in follow-up for additional banding in several weeks.    I enjoyed seeing you again today! As you know, I value our relationship and want to provide genuine, compassionate, and quality care. I welcome your feedback. If you receive a survey regarding your visit,  I greatly appreciate you taking time to fill this out. See you next time!  Wendel Homeyer W. Justa Hatchell, PhD, ANP-BC [facility]       

## 2021-11-05 ENCOUNTER — Ambulatory Visit (INDEPENDENT_AMBULATORY_CARE_PROVIDER_SITE_OTHER): Payer: PPO | Admitting: Gastroenterology

## 2021-11-05 DIAGNOSIS — K642 Third degree hemorrhoids: Secondary | ICD-10-CM | POA: Diagnosis not present

## 2021-11-05 NOTE — Progress Notes (Signed)
    [place] BANDING PROCEDURE NOTE  [name] is a 66 y.o. female presenting today for consideration of hemorrhoid banding. Last colonoscopy May 2023 with one 4 mm polyp in sigmoid, non-bleeding internal hemorrhoids. Main complaints of pressure, itching, soiling. She has had right anterior banding. Still with prolapsing.    The patient presents with symptomatic grade 3 hemorrhoids, unresponsive to maximal medical therapy, requesting rubber band ligation of her hemorrhoidal disease. All risks, benefits, and alternative forms of therapy were described and informed consent was obtained.  The decision was made to band the left lateral internal hemorrhoid, and the [place] was used to perform band ligation without complication. Digital anorectal examination was then performed to assure proper positioning of the band, and to adjust the banded tissue as required. The patient was discharged home without pain or other issues. Dietary and behavioral recommendations were given, along with follow-up instructions. The patient will return in several weeks for followup and possible additional banding as required.With her anatomy, the ligator will need to be positioned just prior to the tic mark depth in order to obtain adequate tissue so as not to "overshoot" the mark.  No complications were encountered and the patient tolerated the procedure well.   Annitta Needs, PhD, ANP-BC [facility]

## 2021-11-05 NOTE — Patient Instructions (Signed)
Please continue to avoid straining.  You should limit your toilet time to 2-3 minutes at the most.   Continue to avoid constipation.  Please call me with any concerns or issues!  I will see you in follow-up for additional banding in several weeks.    I enjoyed seeing you again today! As you know, I value our relationship and want to provide genuine, compassionate, and quality care. I welcome your feedback. If you receive a survey regarding your visit,  I greatly appreciate you taking time to fill this out. See you next time!  Raekwon Winkowski W. Marcellus Pulliam, PhD, ANP-BC [facility]       

## 2021-11-19 ENCOUNTER — Encounter: Payer: PPO | Admitting: Gastroenterology

## 2021-11-26 ENCOUNTER — Ambulatory Visit (INDEPENDENT_AMBULATORY_CARE_PROVIDER_SITE_OTHER): Payer: PPO | Admitting: Gastroenterology

## 2021-11-26 ENCOUNTER — Encounter: Payer: Self-pay | Admitting: Gastroenterology

## 2021-11-26 VITALS — BP 104/68 | HR 87 | Temp 97.8°F | Ht 66.0 in | Wt 134.2 lb

## 2021-11-26 DIAGNOSIS — K642 Third degree hemorrhoids: Secondary | ICD-10-CM | POA: Diagnosis not present

## 2021-11-26 NOTE — Progress Notes (Signed)
    [place] BANDING PROCEDURE NOTE  [name] is a 66 y.o. female presenting today for consideration of hemorrhoid banding. Last colonoscopy May 2023 with one 4 mm polyp in sigmoid, non-bleeding internal hemorrhoids. Main complaints of pressure, itching, soiling. She has had right anterior banding and left lateral. Symptoms have improved.    The patient presents with symptomatic grade 3 hemorrhoids, unresponsive to maximal medical therapy, requesting rubber band ligation of her hemorrhoidal disease. All risks, benefits, and alternative forms of therapy were described and informed consent was obtained.  The decision was made to band the right posterior internal hemorrhoid, and the [place] was used to perform band ligation without complication. Digital anorectal examination was then performed to assure proper positioning of the band, and to adjust the banded tissue as required. The patient was discharged home without pain or other issues. Dietary and behavioral recommendations were given, along with follow-up instructions. The patient will return as needed.   No complications were encountered and the patient tolerated the procedure well.   Annitta Needs, PhD, ANP-BC [facility]

## 2021-11-26 NOTE — Patient Instructions (Signed)
Please continue to avoid straining.  You should limit your toilet time to 2-3 minutes at the most.   Continue to avoid constipation.  Please call me with any concerns or issues!  We will see you back as needed!  I enjoyed seeing you again today! As you know, I value our relationship and want to provide genuine, compassionate, and quality care. I welcome your feedback. If you receive a survey regarding your visit,  I greatly appreciate you taking time to fill this out. See you next time!  Annitta Needs, PhD, ANP-BC [facility]

## 2022-02-09 ENCOUNTER — Other Ambulatory Visit: Payer: PPO | Admitting: Adult Health

## 2022-02-09 DIAGNOSIS — Z0289 Encounter for other administrative examinations: Secondary | ICD-10-CM

## 2022-03-15 ENCOUNTER — Other Ambulatory Visit (INDEPENDENT_AMBULATORY_CARE_PROVIDER_SITE_OTHER): Payer: PPO

## 2022-03-15 ENCOUNTER — Encounter: Payer: Self-pay | Admitting: Internal Medicine

## 2022-03-15 ENCOUNTER — Other Ambulatory Visit: Payer: Self-pay | Admitting: Internal Medicine

## 2022-03-15 ENCOUNTER — Ambulatory Visit: Payer: PPO | Attending: Internal Medicine | Admitting: Internal Medicine

## 2022-03-15 VITALS — BP 120/68 | HR 89 | Ht 65.0 in | Wt 131.0 lb

## 2022-03-15 DIAGNOSIS — E7849 Other hyperlipidemia: Secondary | ICD-10-CM

## 2022-03-15 DIAGNOSIS — I5043 Acute on chronic combined systolic (congestive) and diastolic (congestive) heart failure: Secondary | ICD-10-CM

## 2022-03-15 DIAGNOSIS — R42 Dizziness and giddiness: Secondary | ICD-10-CM

## 2022-03-15 DIAGNOSIS — I10 Essential (primary) hypertension: Secondary | ICD-10-CM | POA: Diagnosis not present

## 2022-03-15 DIAGNOSIS — R079 Chest pain, unspecified: Secondary | ICD-10-CM | POA: Diagnosis not present

## 2022-03-15 DIAGNOSIS — E785 Hyperlipidemia, unspecified: Secondary | ICD-10-CM | POA: Insufficient documentation

## 2022-03-15 DIAGNOSIS — I951 Orthostatic hypotension: Secondary | ICD-10-CM

## 2022-03-15 DIAGNOSIS — R55 Syncope and collapse: Secondary | ICD-10-CM

## 2022-03-15 MED ORDER — LISINOPRIL 20 MG PO TABS: 20.0000 mg | ORAL_TABLET | Freq: Every day | ORAL | 3 refills | Status: DC

## 2022-03-15 MED ORDER — HYDROCHLOROTHIAZIDE 25 MG PO TABS: 25.0000 mg | ORAL_TABLET | Freq: Every day | ORAL | 3 refills | Status: DC

## 2022-03-15 NOTE — Progress Notes (Signed)
Cardiology Office Note  Date: 03/15/2022   ID: [name], DOB 08/02/1955, MRN [id number]  PCP:  [name], MD  Cardiologist:  None Electrophysiologist:  None   Reason for Office Visit: Evaluation of syncope at the request of Dr. Maudie Mercury   History of Present Illness: [name] is a 66 y.o. female known to have HTN, HLD was referred to cardiology clinic for evaluation of syncope at the request of Dr. Maudie Mercury.  Patient started to have 3 presyncopal episodes in the last 6 weeks. The first 2 episodes started during exertion, felt dizzy/lightheaded and was about to pass out when she hold onto something to gain balance. Blood pressure during that time revealed 70/40 mmHg.  After the dizziness resolved, she had mild confusion regarding the event. No jerkiness of extremities or tongue biting or eye rolling during the presyncopal episode. During the third presyncopal episode which happened a week ago, she noticed jerkiness of her extremities with no tongue biting or eye rolling. She continued to have confusion after the event.  She did not have any syncope ever. She stated she had seizure episode twice many years ago when she was administering bone shots. After stopping the bone shots, seizures also resolved. She has not seen neurology ever. She stated she was also diagnosed with congestive heart failure in the past where her legs were swelling up associated with DOE.  She was placed on Lasix after which the swelling resolved. I do not have any records of her prior echo on media/Care Everywhere. She also reported having chest pain during exertion in the last few weeks, frequency twice so far and each episode lasted for minutes associated with SOB.  She continues to smoke cigarettes, current smoker, 1 pack/day.  Denied any alcohol use or illicit drug abuse.  No family history of premature ASCVD.  Past Medical History:  Diagnosis Date   Anxiety    Arthritis    spinal stenosis   Back disorder     "crooked spine"   Bulging lumbar disc    Cervical cancer (HCC)    Complication of anesthesia    ? hypotension 10/2016 at [facility] surgery    Depression    H/O degenerative disc disease    History of radiation therapy 02/22/17-03/22/17   vaginal cuff 30 Gy in 5 fractions   Hypertension    Hypothyroidism    patient taken off of hypothyroid med in 11/2016    Osteoporosis    Thyroid disease     Past Surgical History:  Procedure Laterality Date   ABDOMINAL EXPOSURE N/A 09/27/2018   Procedure: ABDOMINAL EXPOSURE;  Surgeon: Serafina Mitchell, MD;  Location: [facility];  Service: Vascular;  Laterality: N/A;   ANTERIOR AND POSTERIOR REPAIR N/A 11/09/2016   Procedure: ANTERIOR (CYSTOCELE) AND POSTERIOR REPAIR (RECTOCELE);  Surgeon: Jonnie Kind, MD;  Location: [facility];  Service: Gynecology;  Laterality: N/A;   ANTERIOR LUMBAR FUSION Left 09/27/2018   Procedure: ANTERIOR LUMBAR FUSION L5-S1,;  Surgeon: Melina Schools, MD;  Location: [place];  Service: Orthopedics;  Laterality: Left;  4.5 HRS/ DR. Trula Slade ASSIST   BILATERAL SALPINGECTOMY Left 11/09/2016   Procedure: LEFT SALPINGECTOMY;  Surgeon: Jonnie Kind, MD;  Location: [facility];  Service: Gynecology;  Laterality: Left;   CATARACT EXTRACTION W/PHACO Right 07/05/2016   Procedure: CATARACT EXTRACTION PHACO AND INTRAOCULAR LENS PLACEMENT (IOC);  Surgeon: Tonny Branch, MD;  Location: [facility];  Service: Ophthalmology;  Laterality: Right;  CDE: 15.41   CATARACT EXTRACTION  W/PHACO Left 07/19/2016   Procedure: CATARACT EXTRACTION PHACO AND INTRAOCULAR LENS PLACEMENT LEFT EYE CDE= 12.65;  Surgeon: Tonny Branch, MD;  Location: [facility];  Service: Ophthalmology;  Laterality: Left;  left   COLONOSCOPY WITH PROPOFOL N/A 09/09/2021   Procedure: COLONOSCOPY WITH PROPOFOL;  Surgeon: Daneil Dolin, MD;  [facility];  Service: Endoscopy;  Laterality: N/A;  10:30am   LUMBAR LAMINECTOMY/DECOMPRESSION MICRODISCECTOMY Left 09/27/2018   Procedure: L4-L5 Lumbar  Laminectomy/Decompression;  Surgeon: Melina Schools, MD;  Location: [place];  Service: Orthopedics;  Laterality: Left;   POLYPECTOMY  09/09/2021   Procedure: POLYPECTOMY;  Surgeon: Daneil Dolin, MD;  [facility];  Service: Endoscopy;;   ROBOTIC PELVIC AND PARA-AORTIC LYMPH NODE DISSECTION N/A 01/11/2017   Procedure: XI ROBOTIC BILATERAL PELVIC LYMPH NODE DISSECTION;  Surgeon: [name], MD;  Location: [facility];  Service: Gynecology;  Laterality: N/A;   SALPINGOOPHORECTOMY Right 11/09/2016   Procedure: RIGHT SALPINGO OOPHORECTOMY;  Surgeon: Jonnie Kind, MD;  Location: [facility];  Service: Gynecology;  Laterality: Right;   TUBAL LIGATION     VAGINAL HYSTERECTOMY N/A 11/09/2016   Procedure: HYSTERECTOMY VAGINAL;  Surgeon: Jonnie Kind, MD;  Location: [facility];  Service: Gynecology;  Laterality: N/A;    Current Outpatient Medications  Medication Sig Dispense Refill   acetaminophen (TYLENOL) 325 MG tablet Take 650 mg by mouth every 6 (six) hours as needed for headache.     albuterol (VENTOLIN HFA) 108 (90 Base) MCG/ACT inhaler Inhale 1-2 puffs into the lungs every 6 (six) hours as needed for wheezing or shortness of breath. 18 g 0   alendronate (FOSAMAX) 70 MG tablet Take 1 tablet by mouth once a week.     ALPRAZolam (XANAX) 1 MG tablet Take 1 mg by mouth at bedtime. May take additional 1 mg during the day if needed     atorvastatin (LIPITOR) 40 MG tablet Take 40 mg by mouth at bedtime.      FLUoxetine (PROZAC) 40 MG capsule Take 40 mg by mouth daily.     hydrochlorothiazide (HYDRODIURIL) 25 MG tablet Take 1 tablet (25 mg total) by mouth daily. 90 tablet 3   lisinopril (ZESTRIL) 20 MG tablet Take 1 tablet (20 mg total) by mouth daily. 90 tablet 3   traZODone (DESYREL) 100 MG tablet Take 100 mg by mouth at bedtime.     vitamin B-12 (CYANOCOBALAMIN) 500 MCG tablet Take 1,000 mcg by mouth daily.     benzonatate (TESSALON) 100 MG capsule Take by mouth 3 (three) times daily as needed for  cough. (Patient not taking: Reported on 03/15/2022)     No current facility-administered medications for this visit.   Allergies:  Zithromax [azithromycin] and Prednisone   Social History: The patient  reports that she has been smoking cigarettes. She has a 44.00 pack-year smoking history. She has never used smokeless tobacco. She reports that she does not drink alcohol and does not use drugs.   Family History: The patient's family history includes Congenital heart disease in her mother; Diabetes in her mother; Hypertension in her mother; Other in her daughter; Thyroid disease in her brother.   ROS:  Please see the history of present illness. Otherwise, complete review of systems is positive for none.  All other systems are reviewed and negative.   Physical Exam: VS:  BP 120/68   Pulse 89   Ht 5\' 5"  (1.651 m)   Wt 131 lb (59.4 kg)   SpO2 96%   BMI  21.80 kg/m , BMI Body mass index is 21.8 kg/m.  Wt Readings from Last 3 Encounters:  03/15/22 131 lb (59.4 kg)  11/26/21 134 lb 3.2 oz (60.9 kg)  10/15/21 138 lb 3.2 oz (62.7 kg)    General: Patient appears comfortable at rest. HEENT: Conjunctiva and lids normal, oropharynx clear with moist mucosa. Neck: Supple, no elevated JVP or carotid bruits, no thyromegaly. Lungs: Clear to auscultation, nonlabored breathing at rest. Cardiac: Regular rate and rhythm, no S3 or significant systolic murmur, no pericardial rub. Abdomen: Soft, nontender, no hepatomegaly, bowel sounds present, no guarding or rebound. Extremities: No pitting edema, distal pulses 2+. Skin: Warm and dry. Musculoskeletal: No kyphosis. Neuropsychiatric: Alert and oriented x3, affect grossly appropriate.  ECG: Normal sinus rhythm  Recent Labwork: 09/04/2021: BUN 21; Creatinine, Ser 1.41; Potassium 4.0; Sodium 136  No results found for: "CHOL", "TRIG", "HDL", "CHOLHDL", "VLDL", "LDLCALC", "LDLDIRECT"  Other Studies Reviewed Today: I personally reviewed the heart care  referral records from the PCP  Assessment and Plan: Patient is a 66 year old F known to have HTN, HLD, nicotine abuse was referred to cardiology clinic for evaluation of pre-syncope.  # Presyncope likely secondary to orthostatic hypotension -Orthostatic vitals positive in the clinic today. Decrease HCTZ from 50 mg to 25 mg once daily and decrease lisinopril from 40 mg to 20 mg once daily. I will obtain 2-week live monitor to rule out any intermittent conduction abnormality or ventricular arrhythmias.  # HTN, controlled -Orthostatic vitals positive in the clinic today. Decrease HCTZ from 50 mg to 25 mg once daily and decrease lisinopril from 40 mg to 20 mg once daily.  # Chest pain associated with SOB -Obtain exercise Myoview -Obtain 2D echocardiogram  # HLD, unknown values -Continue atorvastatin 40 mg nightly.  HLD management per PCP.  I have spent a total of 33 minutes with patient reviewing chart, EKGs, labs and examining patient as well as establishing an assessment and plan that was discussed with the patient.  > 50% of time was spent in direct patient care.     Medication Adjustments/Labs and Tests Ordered: Current medicines are reviewed at length with the patient today.  Concerns regarding medicines are outlined above.   Tests Ordered: Orders Placed This Encounter  Procedures   NM Myocar Multi W/Spect W/Wall Motion / EF   EKG 12-Lead   ECHOCARDIOGRAM COMPLETE    Medication Changes: Meds ordered this encounter  Medications   lisinopril (ZESTRIL) 20 MG tablet    Sig: Take 1 tablet (20 mg total) by mouth daily.    Dispense:  90 tablet    Refill:  3   hydrochlorothiazide (HYDRODIURIL) 25 MG tablet    Sig: Take 1 tablet (25 mg total) by mouth daily.    Dispense:  90 tablet    Refill:  3    Disposition:  Follow up  3 months  Signed, Abeeha Twist Fidel Levy, MD, 03/15/2022 1:37 PM    [facility] [place] [address], [place], [place]  [id number]

## 2022-03-15 NOTE — Patient Instructions (Signed)
Medication Instructions:  Your physician has recommended you make the following change in your medication:  -Decrease Lisinopril to 20 mg tablets daily -Decrease HCTZ to 25 mg tablets daily   Labwork: None  Testing/Procedures: Your physician has requested that you have an echocardiogram. Echocardiography is a painless test that uses sound waves to create images of your heart. It provides your doctor with information about the size and shape of your heart and how well your heart's chambers and valves are working. This procedure takes approximately one hour. There are no restrictions for this procedure. Please do NOT wear cologne, perfume, aftershave, or lotions (deodorant is allowed). Please arrive 15 minutes prior to your appointment time.  Your physician has requested that you have a lexiscan myoview. For further information please visit [email or web address]. Please follow instruction sheet, as given.   Follow-Up: Follow up with Dr. Dellia Cloud in 3 months.   Any Other Special Instructions Will Be Listed Below (If Applicable).     If you need a refill on your cardiac medications before your next appointment, please call your pharmacy.   ZIO XT- Long Term Monitor Instructions   Your physician has requested you wear your ZIO patch monitor__14__days.   This is a single patch monitor.  [employer] supplies one patch monitor per enrollment.  Additional stickers are not available.   Please do not apply patch if you will be having a Nuclear Stress Test, Echocardiogram, Cardiac CT, MRI, or Chest Xray during the time frame you would be wearing the monitor. The patch cannot be worn during these tests.  You cannot remove and re-apply the ZIO XT patch monitor.   Your ZIO patch monitor will be sent USPS Priority mail from [facility] directly to your home address. The monitor may also be mailed to a PO BOX if home delivery is not available.   It may take 3-5 days to receive your monitor  after you have been enrolled.   Once you have received you monitor, please review enclosed instructions.  Your monitor has already been registered assigning a specific monitor serial # to you.   Applying the monitor   Shave hair from upper left chest.   Hold abrader disc by orange tab.  Rub abrader in 40 strokes over left upper chest as indicated in your monitor instructions.   Clean area with 4 enclosed alcohol pads .  Use all pads to assure are is cleaned thoroughly.  Let dry.   Apply patch as indicated in monitor instructions.  Patch will be place under collarbone on left side of chest with arrow pointing upward.   Rub patch adhesive wings for 2 minutes.Remove white label marked "1".  Remove white label marked "2".  Rub patch adhesive wings for 2 additional minutes.   While looking in a mirror, press and release button in center of patch.  A small green light will flash 3-4 times .  This will be your only indicator the monitor has been turned on.     Do not shower for the first 24 hours.  You may shower after the first 24 hours.   Press button if you feel a symptom. You will hear a small click.  Record Date, Time and Symptom in the Patient Log Book.   When you are ready to remove patch, follow instructions on last 2 pages of Patient Log Book.  Stick patch monitor onto last page of Patient Log Book.   Place Patient Log Book in [place] box.  Use locking tab  on box and tape box closed securely.  The [employer] has [employer] on it.  Please place in mailbox as soon as possible.  Your physician should have your test results approximately 7 days after the monitor has been mailed back to [facility]   Call [facility] at [phone] if you have questions regarding your ZIO XT patch monitor.  Call them immediately if you see an orange light blinking on your monitor.   If your monitor falls off in less than 4 days contact our Monitor department at  [phone].  If your monitor becomes loose or falls off after 4 days call [employer] at [phone] for suggestions on securing your monitor.

## 2022-03-18 ENCOUNTER — Ambulatory Visit (INDEPENDENT_AMBULATORY_CARE_PROVIDER_SITE_OTHER): Payer: PPO | Admitting: Gastroenterology

## 2022-03-18 ENCOUNTER — Encounter: Payer: Self-pay | Admitting: Gastroenterology

## 2022-03-18 VITALS — BP 102/69 | HR 94 | Temp 97.8°F | Ht 65.0 in | Wt 131.4 lb

## 2022-03-18 DIAGNOSIS — K642 Third degree hemorrhoids: Secondary | ICD-10-CM

## 2022-03-18 NOTE — Patient Instructions (Signed)
  Please avoid straining.  You should limit your toilet time to 2-3 minutes at the most.   I recommend Benefiber 2 teaspoons each morning in the beverage of your choice!  Please call me with any concerns or issues!  I will see you in follow-up for additional banding if needed in January 2024!  Have a great holiday season!  I enjoyed seeing you again today! As you know, I value our relationship and want to provide genuine, compassionate, and quality care. I welcome your feedback. If you receive a survey regarding your visit,  I greatly appreciate you taking time to fill this out. See you next time!  Annitta Needs, PhD, ANP-BC [facility]

## 2022-03-18 NOTE — Progress Notes (Signed)
    [place] BANDING PROCEDURE NOTE  [name] is a 66 y.o. female presenting today for consideration of hemorrhoid banding. Last colonoscopy  May 2023 with one 4 mm polyp in sigmoid, non-bleeding internal hemorrhoids. Main complaints of pressure, itching, soiling. She has had right anterior banding, left lateral, and right posterior.    The patient presents with symptomatic grade 3 hemorrhoids, unresponsive to maximal medical therapy, requesting rubber band ligation of her hemorrhoidal disease. All risks, benefits, and alternative forms of therapy were described and informed consent was obtained.  In left lateral position, anoscopy revealed Grade 2-3 hemorrhoids predominantly in right anterior, followed by left lateral.   The decision was made to band the right anterior internal hemorrhoid, and the [place] was used to perform band ligation without complication. Digital anorectal examination was then performed to assure proper positioning of the band, and to adjust the banded tissue as required. The patient was discharged home without pain or other issues. Dietary and behavioral recommendations were given, along with follow-up instructions. The patient will return in several weeks for followup and possible additional banding as required.  No complications were encountered and the patient tolerated the procedure well.   Annitta Needs, PhD, ANP-BC [facility]

## 2022-03-19 ENCOUNTER — Ambulatory Visit (HOSPITAL_COMMUNITY)
Admission: RE | Admit: 2022-03-19 | Discharge: 2022-03-19 | Disposition: A | Discharge status: Home / Self Care | Payer: PPO | Source: Ambulatory Visit | Attending: Internal Medicine | Admitting: Internal Medicine

## 2022-03-19 DIAGNOSIS — I5043 Acute on chronic combined systolic (congestive) and diastolic (congestive) heart failure: Secondary | ICD-10-CM | POA: Diagnosis present

## 2022-03-19 LAB — ECHOCARDIOGRAM COMPLETE
AR max vel: 2.75 cm2
AV Area VTI: 2.91 cm2
AV Area mean vel: 2.87 cm2
AV Mean grad: 3.1 mmHg
AV Peak grad: 6.7 mmHg
Ao pk vel: 1.29 m/s
Area-P 1/2: 4.31 cm2
S' Lateral: 2 cm

## 2022-03-19 NOTE — Progress Notes (Signed)
*  PRELIMINARY RESULTS* Echocardiogram 2D Echocardiogram has been performed.  [name] 03/19/2022, 2:57 PM

## 2022-03-23 ENCOUNTER — Encounter (HOSPITAL_COMMUNITY): Payer: Self-pay

## 2022-03-23 ENCOUNTER — Ambulatory Visit (HOSPITAL_COMMUNITY)
Admission: RE | Admit: 2022-03-23 | Discharge: 2022-03-23 | Disposition: A | Discharge status: Home / Self Care | Payer: PPO | Source: Ambulatory Visit | Attending: Internal Medicine | Admitting: Internal Medicine

## 2022-03-23 DIAGNOSIS — R079 Chest pain, unspecified: Secondary | ICD-10-CM | POA: Diagnosis not present

## 2022-03-23 LAB — NM MYOCAR MULTI W/SPECT W/WALL MOTION / EF
Angina Index: 0
Duke Treadmill Score: 5
Estimated workload: 7
Exercise duration (min): 4 min
Exercise duration (sec): 56 s
LV dias vol: 39 mL (ref 46–106)
LV sys vol: 5 mL
MPHR: 154 {beats}/min
Nuc Stress EF: 86 %
Peak HR: 142 {beats}/min
Percent HR: 92 %
RATE: 0.4
RPE: 14
Rest HR: 85 {beats}/min
Rest Nuclear Isotope Dose: 10.7 mCi
SDS: 4
SRS: 0
SSS: 4
ST Depression (mm): 0.5 mm
Stress Nuclear Isotope Dose: 33 mCi
TID: 0.78

## 2022-03-23 MED ORDER — REGADENOSON 0.4 MG/5ML IV SOLN
INTRAVENOUS | Status: AC
  Filled 2022-03-23: qty 5

## 2022-03-23 MED ORDER — TECHNETIUM TC 99M TETROFOSMIN IV KIT
10.0000 | PACK | Freq: Once | INTRAVENOUS | Status: AC | PRN
  Administered 2022-03-23: 10.7 via INTRAVENOUS

## 2022-03-23 MED ORDER — SODIUM CHLORIDE FLUSH 0.9 % IV SOLN
INTRAVENOUS | Status: AC
  Administered 2022-03-23: 10 mL via INTRAVENOUS
  Filled 2022-03-23: qty 10

## 2022-03-23 MED ORDER — TECHNETIUM TC 99M TETROFOSMIN IV KIT
30.0000 | PACK | Freq: Once | INTRAVENOUS | Status: AC | PRN
  Administered 2022-03-23: 33 via INTRAVENOUS

## 2022-03-26 ENCOUNTER — Telehealth: Payer: Self-pay | Admitting: Internal Medicine

## 2022-03-26 NOTE — Telephone Encounter (Signed)
Patient is returning call to discuss stress test results.

## 2022-03-26 NOTE — Telephone Encounter (Signed)
Called transferred to me, results discussed with patient

## 2022-04-12 DIAGNOSIS — C3490 Malignant neoplasm of unspecified part of unspecified bronchus or lung: Secondary | ICD-10-CM

## 2022-04-12 HISTORY — PX: LUNG CANCER SURGERY: SHX702

## 2022-04-12 HISTORY — DX: Malignant neoplasm of unspecified part of unspecified bronchus or lung: C34.90

## 2022-04-13 ENCOUNTER — Telehealth: Payer: Self-pay

## 2022-04-13 NOTE — Telephone Encounter (Signed)
-----   Message from Chalmers Guest, MD sent at 04/09/2022  4:37 PM EST ----- No evidence of arrhythmias or pauses. Unremarkable event monitor.

## 2022-04-13 NOTE — Telephone Encounter (Signed)
Patient notified and verbalized understanding. Patient had no questions or concerns at this time. PCP copied 

## 2022-04-17 ENCOUNTER — Ambulatory Visit (INDEPENDENT_AMBULATORY_CARE_PROVIDER_SITE_OTHER): Payer: PPO

## 2022-04-17 ENCOUNTER — Other Ambulatory Visit: Payer: Self-pay

## 2022-04-17 ENCOUNTER — Ambulatory Visit
Admission: EM | Admit: 2022-04-17 | Discharge: 2022-04-17 | Disposition: A | Discharge status: Home / Self Care | Payer: PPO | Attending: Nurse Practitioner | Admitting: Nurse Practitioner

## 2022-04-17 DIAGNOSIS — R059 Cough, unspecified: Secondary | ICD-10-CM | POA: Diagnosis not present

## 2022-04-17 DIAGNOSIS — R918 Other nonspecific abnormal finding of lung field: Secondary | ICD-10-CM | POA: Diagnosis not present

## 2022-04-17 MED ORDER — PREDNISONE 20 MG PO TABS: 20.0000 mg | ORAL_TABLET | Freq: Every day | ORAL | 0 refills | Status: DC

## 2022-04-17 MED ORDER — AMOXICILLIN-POT CLAVULANATE 875-125 MG PO TABS: 1.0000 | ORAL_TABLET | Freq: Two times a day (BID) | ORAL | 0 refills | Status: DC

## 2022-04-17 MED ORDER — ALBUTEROL SULFATE HFA 108 (90 BASE) MCG/ACT IN AERS: 2.0000 | INHALATION_SPRAY | Freq: Four times a day (QID) | RESPIRATORY_TRACT | 0 refills | Status: DC | PRN

## 2022-04-17 NOTE — ED Provider Notes (Signed)
[facility]    CSN: [id number] Arrival date & time: 04/17/22  1225      History   Chief Complaint Chief Complaint  Patient presents with   Cough    HPI [name] is a 67 y.o. female.   The history is provided by the patient.   Patient presents for complaints of cough that has been persistent over the past month.  Patient states cough appears to be worsening.  She states earlier today, she also had an episode of midsternal chest pain.  Patient reports that the cough has been more persistent.  She states that her PCP has prescribed her benzonatate, that she takes, but has been taking them more frequently.  She denies fever, chills, wheezing, shortness of breath, difficulty breathing, abdominal pain, nausea, vomiting, or diarrhea.  Patient reports that she is under the care of cardiology for hypotension.  She states that she was putting something in her car approximately 1 month ago, and states that since that time, her cough has worsened.  Patient reports that she is a current smoker, states that she smoked since the age of 40.  Smokes 1 pack/day.  Past Medical History:  Diagnosis Date   Anxiety    Arthritis    spinal stenosis   Back disorder    "crooked spine"   Bulging lumbar disc    Cervical cancer (HCC)    Complication of anesthesia    ? hypotension 10/2016 at [facility] surgery    Depression    H/O degenerative disc disease    History of radiation therapy 02/22/17-03/22/17   vaginal cuff 30 Gy in 5 fractions   Hypertension    Hypothyroidism    patient taken off of hypothyroid med in 11/2016    Osteoporosis    Thyroid disease     Patient Active Problem List   Diagnosis Date Noted   HTN (hypertension) 03/15/2022   Postural dizziness with presyncope 03/15/2022   Orthostatic hypotension 03/15/2022   Chest pain of uncertain etiology 40/81/4481   HLD (hyperlipidemia) 03/15/2022   S/P hysterectomy 08/26/2021   Encounter for screening colonoscopy  08/19/2021   Hemorrhoids 08/19/2021   S/P radiation therapy 01/20/2021   Anal sphincter incontinence 01/20/2021   Tenderness of female pelvic organs 01/20/2021   AK (actinic keratosis) 01/17/2020   Encounter for screening fecal occult blood testing 01/17/2020   Encounter for vaginal Papanicolaou smear following hysterectomy for malignancy 01/17/2020   Well woman exam with routine gynecological exam 01/17/2020   Constipation 01/29/2019   Preventative health care 01/29/2019   Vaginal pain 12/12/2018   S/P lumbar fusion 09/27/2018   Degeneration of lumbar intervertebral disc 04/19/2017   Major depressive disorder, single episode, unspecified 03/02/2017   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   Hyperthyroidism 11/19/2016   S/P vaginal hysterectomy with anterior, and posterior repair, right salpingo-oophorectomy and left salpingectomy 11/09/2016   Status post vaginal hysterectomy 11/09/2016   Moderate episode of recurrent major depressive disorder ([place]) 01/06/2016    Past Surgical History:  Procedure Laterality Date   ABDOMINAL EXPOSURE N/A 09/27/2018   Procedure: ABDOMINAL EXPOSURE;  Surgeon: Serafina Mitchell, MD;  Location: [facility];  Service: Vascular;  Laterality: N/A;   ANTERIOR AND POSTERIOR REPAIR N/A 11/09/2016   Procedure: ANTERIOR (CYSTOCELE) AND POSTERIOR REPAIR (RECTOCELE);  Surgeon: Jonnie Kind, MD;  Location: [facility];  Service: Gynecology;  Laterality: N/A;   ANTERIOR LUMBAR FUSION Left 09/27/2018   Procedure: ANTERIOR LUMBAR FUSION L5-S1,;  Surgeon: Melina Schools,  MD;  Location: [place];  Service: Orthopedics;  Laterality: Left;  4.5 HRS/ DR. Trula Slade ASSIST   BILATERAL SALPINGECTOMY Left 11/09/2016   Procedure: LEFT SALPINGECTOMY;  Surgeon: Jonnie Kind, MD;  Location: [facility];  Service: Gynecology;  Laterality: Left;   CATARACT EXTRACTION W/PHACO Right 07/05/2016   Procedure: CATARACT EXTRACTION PHACO AND INTRAOCULAR LENS PLACEMENT (IOC);  Surgeon: Tonny Branch,  MD;  Location: [facility];  Service: Ophthalmology;  Laterality: Right;  CDE: 15.41   CATARACT EXTRACTION W/PHACO Left 07/19/2016   Procedure: CATARACT EXTRACTION PHACO AND INTRAOCULAR LENS PLACEMENT LEFT EYE CDE= 12.65;  Surgeon: Tonny Branch, MD;  Location: [facility];  Service: Ophthalmology;  Laterality: Left;  left   COLONOSCOPY WITH PROPOFOL N/A 09/09/2021   Procedure: COLONOSCOPY WITH PROPOFOL;  Surgeon: Daneil Dolin, MD;  [facility];  Service: Endoscopy;  Laterality: N/A;  10:30am   LUMBAR LAMINECTOMY/DECOMPRESSION MICRODISCECTOMY Left 09/27/2018   Procedure: L4-L5 Lumbar Laminectomy/Decompression;  Surgeon: Melina Schools, MD;  Location: [place];  Service: Orthopedics;  Laterality: Left;   POLYPECTOMY  09/09/2021   Procedure: POLYPECTOMY;  Surgeon: Daneil Dolin, MD;  [facility];  Service: Endoscopy;;   ROBOTIC PELVIC AND PARA-AORTIC LYMPH NODE DISSECTION N/A 01/11/2017   Procedure: XI ROBOTIC BILATERAL PELVIC LYMPH NODE DISSECTION;  Surgeon: [name], MD;  Location: [facility];  Service: Gynecology;  Laterality: N/A;   SALPINGOOPHORECTOMY Right 11/09/2016   Procedure: RIGHT SALPINGO OOPHORECTOMY;  Surgeon: Jonnie Kind, MD;  Location: [facility];  Service: Gynecology;  Laterality: Right;   TUBAL LIGATION     VAGINAL HYSTERECTOMY N/A 11/09/2016   Procedure: HYSTERECTOMY VAGINAL;  Surgeon: Jonnie Kind, MD;  Location: [facility];  Service: Gynecology;  Laterality: N/A;    OB History     Gravida  1   Para  1   Term  1   Preterm      AB      Living  1      SAB      IAB      Ectopic      Multiple      Live Births  1            Home Medications    Prior to Admission medications   Medication Sig Start Date End Date Taking? Authorizing Provider  albuterol (VENTOLIN HFA) 108 (90 Base) MCG/ACT inhaler Inhale 2 puffs into the lungs every 6 (six) hours as needed for wheezing or shortness of breath. 04/17/22  Yes Evee Liska-Warren, Alda Lea, NP   amoxicillin-clavulanate (AUGMENTIN) 875-125 MG tablet Take 1 tablet by mouth every 12 (twelve) hours. 04/17/22  Yes Luda Charbonneau-Warren, Alda Lea, NP  predniSONE (DELTASONE) 20 MG tablet Take 1 tablet (20 mg total) by mouth daily with breakfast for 5 days. 04/17/22 04/22/22 Yes Mckensi Redinger-Warren, Alda Lea, NP  acetaminophen (TYLENOL) 325 MG tablet Take 650 mg by mouth every 6 (six) hours as needed for headache.    [provider]  alendronate (FOSAMAX) 70 MG tablet Take 1 tablet by mouth once a week. 12/11/18   [provider]  ALPRAZolam Duanne Moron) 1 MG tablet Take 1 mg by mouth at bedtime. May take additional 1 mg during the day if needed 09/24/19   [provider]  atorvastatin (LIPITOR) 40 MG tablet Take 40 mg by mouth at bedtime.     [provider]  benzonatate (TESSALON) 100 MG capsule Take by mouth 3 (three) times daily as needed for cough.    [provider]  FLUoxetine (PROZAC) 40 MG capsule Take 40 mg by mouth daily. 11/10/21   [provider]  hydrochlorothiazide (HYDRODIURIL) 25 MG tablet Take 1 tablet (25 mg total) by mouth daily. 03/15/22 03/10/23  Mallipeddi, Vishnu P, MD  lisinopril (ZESTRIL) 20 MG tablet Take 1 tablet (20 mg total) by mouth daily. 03/15/22 03/10/23  Mallipeddi, Vishnu P, MD  traZODone (DESYREL) 100 MG tablet Take 100 mg by mouth at bedtime. 11/17/19   [provider]  vitamin B-12 (CYANOCOBALAMIN) 500 MCG tablet Take 1,000 mcg by mouth daily.    [provider]    Family History Family History  Problem Relation Age of Onset   Hypertension Mother    Congenital heart disease Mother    Diabetes Mother    Thyroid disease Brother    Other Daughter        bowel issues   Colon cancer Neg Hx    Colon polyps Neg Hx     Social History Social History   Tobacco Use   Smoking status: Every Day    Packs/day: 1.00    Years: 44.00    Total pack years: 44.00    Types: Cigarettes   Smokeless tobacco: Never  Vaping  Use   Vaping Use: Never used  Substance Use Topics   Alcohol use: No    Comment: 01-06-2016 Per pt rarely, 02-05-2016 per pt no but 75yrs ago     Drug use: No    Comment: 02-05-2016 per pt no but about 40 yrs ago     Allergies   Zithromax [azithromycin] and Prednisone   Review of Systems Review of Systems Per HPI  Physical Exam Triage Vital Signs ED Triage Vitals [04/17/22 1240]  Enc Vitals Group     BP (!) 90/57     Pulse Rate 100     Resp (!) 22     Temp 98.6 F (37 C)     Temp Source Oral     SpO2 92 %     Weight      Height      Head Circumference      Peak Flow      Pain Score 0     Pain Loc      Pain Edu?      Excl. in [place]?    No data found.  Updated Vital Signs BP (!) 90/57 (BP Location: Right Arm)   Pulse 100   Temp 98.6 F (37 C) (Oral)   Resp (!) 22   SpO2 92%   Visual Acuity Right Eye Distance:   Left Eye Distance:   Bilateral Distance:    Right Eye Near:   Left Eye Near:    Bilateral Near:     Physical Exam Vitals and nursing note reviewed.  Constitutional:      General: She is not in acute distress.    Appearance: Normal appearance.  HENT:     Head: Normocephalic.     Right Ear: Tympanic membrane, ear canal and external ear normal.     Left Ear: Tympanic membrane, ear canal and external ear normal.     Nose: Nose normal.     Mouth/Throat:     Mouth: Mucous membranes are moist.     Pharynx: Posterior oropharyngeal erythema present.  Eyes:     Extraocular Movements: Extraocular movements intact.     Pupils: Pupils are equal, round, and reactive to light.  Cardiovascular:     Rate and Rhythm: Normal rate and  regular rhythm.     Pulses: Normal pulses.     Heart sounds: Normal heart sounds.  Pulmonary:     Effort: Pulmonary effort is normal. No respiratory distress.     Breath sounds: Normal breath sounds. No stridor. No wheezing, rhonchi or rales.  Abdominal:     General: Bowel sounds are normal.     Palpations: Abdomen is soft.      Tenderness: There is no abdominal tenderness.  Musculoskeletal:     Cervical back: Normal range of motion.  Lymphadenopathy:     Cervical: No cervical adenopathy.  Skin:    General: Skin is warm and dry.  Neurological:     General: No focal deficit present.     Mental Status: She is alert and oriented to person, place, and time.  Psychiatric:        Mood and Affect: Mood normal.        Behavior: Behavior normal.      UC Treatments / Results  Labs (all labs ordered are listed, but only abnormal results are displayed) Labs Reviewed - No data to display  EKG: EKG performed which shows normal sinus rhythm, no acute STEMI.  Compared to EKG performed on 03/15/2022.   Radiology DG Chest 2 View  Result Date: 04/17/2022 CLINICAL DATA:  Cough for 1 month.  Smoker. EXAM: CHEST - 2 VIEW COMPARISON:  03/05/2020 FINDINGS: Heart size appears normal. Mass within the right lower lobe measures 4.3 x 3.3 cm. No pleural fluid or airspace disease. Lungs appear hyperinflated with diffuse chronic interstitial coarsening. Visualized osseous structures appear intact. IMPRESSION: 1. Right lower lobe mass. Recommend further evaluation with contrast enhanced CT of the chest. Electronically Signed   By: Kerby Moors M.D.   On: 04/17/2022 13:24    Procedures Procedures (including critical care time)  Medications Ordered in UC Medications - No data to display  Initial Impression / Assessment and Plan / UC Course  I have reviewed the triage vital signs and the nursing notes.  Pertinent labs & imaging results that were available during my care of the patient were reviewed by me and considered in my medical decision making (see chart for details).  The patient with persistent cough, she is in no acute distress, she is hypotensive, but patient states this is baseline for her.  Patient presents for complaints of cough that is been present for the past month.  Patient also complains of pain in her right  side.  X-ray of her chest revealed a right lower lobe lung mass.  Findings are consistent with where the pain is presently for the patient.  Spoke with Dr. Kerby Moors, radiologist, and he advised that patient can follow-up with her primary care if she is stable.  Patient was advised of the same.  Patient was advised to follow-up with her primary care on 04/19/2022 for further evaluation of the findings of her chest x-ray.  In the interim, patient was prescribed albuterol inhaler for her cough and shortness of breath, Augmentin 875/125 for possible infection, and prednisone 20 mg for 5 days.  Patient was advised to follow-up with her primary care as discussed.  Patient was given strict ER precautions.  Patient verbalizes understanding.  All questions were answered.  Patient stable for discharge.   Final Clinical Impressions(s) / UC Diagnoses   Final diagnoses:  Cough, unspecified type  Right lower lobe lung mass     Discharge Instructions      The x-ray shows a right lower  lobe lung mass.  Based on the results of the CT scan, it is recommended that you follow-up with your primary care physician as soon as possible to have a CT of your chest performed. Take medication as prescribed. Recommend consideration of smoking cessation as this will help your cough. Increase fluids and allow for plenty of rest. Recommend using a humidifier in your bedroom at nighttime during sleep and sleeping elevated on pillows while cough symptoms persist. Please follow-up with your primary care physician on 04/19/2022 to inform of the results of this chest x-ray and for next steps. Follow-up as needed.     ED Prescriptions     Medication Sig Dispense Auth. Provider   amoxicillin-clavulanate (AUGMENTIN) 875-125 MG tablet Take 1 tablet by mouth every 12 (twelve) hours. 14 tablet Allure Greaser-Warren, Alda Lea, NP   predniSONE (DELTASONE) 20 MG tablet Take 1 tablet (20 mg total) by mouth daily with breakfast for 5 days. 5  tablet Georgina Krist-Warren, Alda Lea, NP   albuterol (VENTOLIN HFA) 108 (90 Base) MCG/ACT inhaler Inhale 2 puffs into the lungs every 6 (six) hours as needed for wheezing or shortness of breath. 8 g Inaara Tye-Warren, Alda Lea, NP      PDMP not reviewed this encounter.   Tish Men, NP 04/17/22 1444

## 2022-04-17 NOTE — Discharge Instructions (Addendum)
The x-ray shows a right lower lobe lung mass.  Based on the results of the CT scan, it is recommended that you follow-up with your primary care physician as soon as possible to have a CT of your chest performed. Take medication as prescribed. Recommend consideration of smoking cessation as this will help your cough. Increase fluids and allow for plenty of rest. Recommend using a humidifier in your bedroom at nighttime during sleep and sleeping elevated on pillows while cough symptoms persist. Please follow-up with your primary care physician on 04/19/2022 to inform of the results of this chest x-ray and for next steps. Follow-up as needed.

## 2022-04-17 NOTE — ED Triage Notes (Signed)
Pt reports cough x 1 month. Reports she was in her car a month ago and felt something pop in the back and she is having the cough since them. Pt reports she was told by PCO to have a chest x ray 1 year ago and she never went.

## 2022-04-21 ENCOUNTER — Other Ambulatory Visit (HOSPITAL_COMMUNITY): Payer: Self-pay | Admitting: Adult Health

## 2022-04-21 DIAGNOSIS — R059 Cough, unspecified: Secondary | ICD-10-CM

## 2022-04-21 DIAGNOSIS — R918 Other nonspecific abnormal finding of lung field: Secondary | ICD-10-CM

## 2022-04-22 ENCOUNTER — Ambulatory Visit (INDEPENDENT_AMBULATORY_CARE_PROVIDER_SITE_OTHER): Payer: PPO | Admitting: Gastroenterology

## 2022-04-22 ENCOUNTER — Encounter: Payer: Self-pay | Admitting: Gastroenterology

## 2022-04-22 VITALS — BP 96/63 | HR 76 | Temp 97.4°F | Ht 66.0 in | Wt 126.0 lb

## 2022-04-22 DIAGNOSIS — K642 Third degree hemorrhoids: Secondary | ICD-10-CM | POA: Diagnosis not present

## 2022-04-22 NOTE — Progress Notes (Signed)
    [place] BANDING PROCEDURE NOTE  [name] is a 68 y.o. female presenting today for consideration of hemorrhoid banding. Last colonoscopy  Last colonoscopy  May 2023 with one 4 mm polyp in sigmoid, non-bleeding internal hemorrhoids. Main complaints of pressure, itching, soiling. She has had right anterior banding X 2, left lateral, and right posterior. at last visit, anoscopy had also revealed left lateral Grade 2-3 hemorrhoids. She still notes prolapsing at times. She was recently diagnosed with concerning lung lesion, and she has a CT upcoming next week.   The patient presents with symptomatic grade 3 hemorrhoids, unresponsive to maximal medical therapy, requesting rubber band ligation of his/her hemorrhoidal disease. All risks, benefits, and alternative forms of therapy were described and informed consent was obtained.  The decision was made to band the left lateral internal hemorrhoid, and the [place]ana was used to perform band ligation without complication. Digital anorectal examination was then performed to assure proper positioning of the band, and to adjust the banded tissue as required. The patient was discharged home without pain or other issues. Dietary and behavioral recommendations were given, along with follow-up instructions. The patient will return as needed for follow-up.   No complications were encountered and the patient tolerated the procedure well.   Annitta Needs, PhD, ANP-BC [facility]

## 2022-04-22 NOTE — Patient Instructions (Signed)
We will see you back as needed!  Please call if you feel you need any additional banding!  I enjoyed seeing you again today! As you know, I value our relationship and want to provide genuine, compassionate, and quality care. I welcome your feedback. If you receive a survey regarding your visit,  I greatly appreciate you taking time to fill this out. See you next time!  Annitta Needs, PhD, ANP-BC [facility]

## 2022-04-26 ENCOUNTER — Ambulatory Visit (HOSPITAL_COMMUNITY)
Admission: RE | Admit: 2022-04-26 | Discharge: 2022-04-26 | Disposition: A | Discharge status: Home / Self Care | Payer: PPO | Source: Ambulatory Visit | Attending: Adult Health | Admitting: Adult Health

## 2022-04-26 DIAGNOSIS — R059 Cough, unspecified: Secondary | ICD-10-CM | POA: Diagnosis not present

## 2022-04-26 DIAGNOSIS — R918 Other nonspecific abnormal finding of lung field: Secondary | ICD-10-CM | POA: Insufficient documentation

## 2022-05-04 ENCOUNTER — Inpatient Hospital Stay: Payer: PPO | Attending: Hematology | Admitting: Hematology

## 2022-05-04 VITALS — BP 149/93 | HR 85 | Temp 98.0°F | Resp 18 | Ht 65.5 in | Wt 122.7 lb

## 2022-05-04 DIAGNOSIS — C3490 Malignant neoplasm of unspecified part of unspecified bronchus or lung: Secondary | ICD-10-CM

## 2022-05-04 DIAGNOSIS — Z803 Family history of malignant neoplasm of breast: Secondary | ICD-10-CM | POA: Insufficient documentation

## 2022-05-04 DIAGNOSIS — Z8541 Personal history of malignant neoplasm of cervix uteri: Secondary | ICD-10-CM | POA: Diagnosis not present

## 2022-05-04 DIAGNOSIS — Z923 Personal history of irradiation: Secondary | ICD-10-CM | POA: Diagnosis not present

## 2022-05-04 DIAGNOSIS — F17210 Nicotine dependence, cigarettes, uncomplicated: Secondary | ICD-10-CM

## 2022-05-04 DIAGNOSIS — R918 Other nonspecific abnormal finding of lung field: Secondary | ICD-10-CM | POA: Insufficient documentation

## 2022-05-04 DIAGNOSIS — J984 Other disorders of lung: Secondary | ICD-10-CM | POA: Insufficient documentation

## 2022-05-04 NOTE — Progress Notes (Signed)
[facility] CONSULT NOTE  Patient Care Team: Pearson Grippe, MD as PCP - General (Internal Medicine) Venita Lick, MD as Consulting Physician (Orthopedic Surgery) Jena Gauss Gerrit Friends, MD as Consulting Physician (Gastroenterology) Doreatha Massed, MD as Medical Oncologist (Medical Oncology) Therese Sarah, RN as Oncology Nurse Navigator (Medical Oncology)  CHIEF COMPLAINTS/PURPOSE OF CONSULTATION:  Right lower lobe cavitary lung mass.  HISTORY OF PRESENTING ILLNESS:  [name] 67 y.o. female is seen in consultation today at the request of Dr. Selena Batten for abnormal CT scan.  Patient had a chronic cough for several years.  Recently slight worsening and hurting of the right chest wall.  Chest x-ray on 04/17/2022 showed right lower lobe mass.  CT scan of the chest without contrast on 04/26/2022 showed 3.8 cm cavitary mass within the anterior right lower lobe abutting major fissure.  4 mm subpleural nodule in the medial right upper lobe nonspecific.  Subacute fracture deformities involving anterior aspect of the right eighth and ninth ribs.  She reports 18 pound weight loss in the last 3 months due to decreased appetite.  Denies any hemoptysis.  She has clear sputum on expectoration.  No fevers or night sweats.  She lives at home with her husband Moise Boring who is present today.  MEDICAL HISTORY:  Past Medical History:  Diagnosis Date   Anxiety    Arthritis    spinal stenosis   Back disorder    "crooked spine"   Bulging lumbar disc    Cervical cancer (HCC)    Complication of anesthesia    ? hypotension 10/2016 at [facility] surgery    Depression    H/O degenerative disc disease    History of radiation therapy 02/22/17-03/22/17   vaginal cuff 30 Gy in 5 fractions   Hypertension    Hypothyroidism    patient taken off of hypothyroid med in 11/2016    Osteoporosis    Thyroid disease     SURGICAL HISTORY: Past Surgical History:  Procedure Laterality Date   ABDOMINAL  EXPOSURE N/A 09/27/2018   Procedure: ABDOMINAL EXPOSURE;  Surgeon: Nada Libman, MD;  Location: [facility] OR;  Service: Vascular;  Laterality: N/A;   ANTERIOR AND POSTERIOR REPAIR N/A 11/09/2016   Procedure: ANTERIOR (CYSTOCELE) AND POSTERIOR REPAIR (RECTOCELE);  Surgeon: Tilda Burrow, MD;  Location: [facility] ORS;  Service: Gynecology;  Laterality: N/A;   ANTERIOR LUMBAR FUSION Left 09/27/2018   Procedure: ANTERIOR LUMBAR FUSION L5-S1,;  Surgeon: Venita Lick, MD;  Location: [facility] OR;  Service: Orthopedics;  Laterality: Left;  4.5 HRS/ DR. Myra Gianotti ASSIST   BILATERAL SALPINGECTOMY Left 11/09/2016   Procedure: LEFT SALPINGECTOMY;  Surgeon: Tilda Burrow, MD;  Location: [facility] ORS;  Service: Gynecology;  Laterality: Left;   CATARACT EXTRACTION W/PHACO Right 07/05/2016   Procedure: CATARACT EXTRACTION PHACO AND INTRAOCULAR LENS PLACEMENT (IOC);  Surgeon: Gemma Payor, MD;  Location: [facility];  Service: Ophthalmology;  Laterality: Right;  CDE: 15.41   CATARACT EXTRACTION W/PHACO Left 07/19/2016   Procedure: CATARACT EXTRACTION PHACO AND INTRAOCULAR LENS PLACEMENT LEFT EYE CDE= 12.65;  Surgeon: Gemma Payor, MD;  Location: [facility];  Service: Ophthalmology;  Laterality: Left;  left   COLONOSCOPY WITH PROPOFOL N/A 09/09/2021   Procedure: COLONOSCOPY WITH PROPOFOL;  Surgeon: Corbin Ade, MD;  Location: [facility];  Service: Endoscopy;  Laterality: N/A;  10:30am   LUMBAR LAMINECTOMY/DECOMPRESSION MICRODISCECTOMY Left 09/27/2018   Procedure: L4-L5 Lumbar Laminectomy/Decompression;  Surgeon: Venita Lick, MD;  Location: [facility] OR;  Service: Orthopedics;  Laterality:  Left;   POLYPECTOMY  09/09/2021   Procedure: POLYPECTOMY;  Surgeon: Corbin Ade, MD;  Location: [facility];  Service: Endoscopy;;   ROBOTIC PELVIC AND PARA-AORTIC LYMPH NODE DISSECTION N/A 01/11/2017   Procedure: XI ROBOTIC BILATERAL PELVIC LYMPH NODE DISSECTION;  Surgeon: Adolphus Birchwood, MD;  Location: [facility] ORS;  Service: Gynecology;  Laterality: N/A;    SALPINGOOPHORECTOMY Right 11/09/2016   Procedure: RIGHT SALPINGO OOPHORECTOMY;  Surgeon: Tilda Burrow, MD;  Location: [facility];  Service: Gynecology;  Laterality: Right;   TUBAL LIGATION     VAGINAL HYSTERECTOMY N/A 11/09/2016   Procedure: HYSTERECTOMY VAGINAL;  Surgeon: Tilda Burrow, MD;  Location: [facility];  Service: Gynecology;  Laterality: N/A;    SOCIAL HISTORY: Social History   Socioeconomic History   Marital status: Legally Separated    Spouse name: Not on file   Number of children: 1   Years of education: Not on file   Highest education level: Not on file  Occupational History   Not on file  Tobacco Use   Smoking status: Every Day    Packs/day: 1.00    Years: 44.00    Total pack years: 44.00    Types: Cigarettes   Smokeless tobacco: Never  Vaping Use   Vaping Use: Never used  Substance and Sexual Activity   Alcohol use: No    Comment: 01-06-2016 Per pt rarely, 02-05-2016 per pt no but 86yrs ago     Drug use: No    Comment: 02-05-2016 per pt no but about 40 yrs ago   Sexual activity: Not Currently    Birth control/protection: Surgical    Comment: hyst  Other Topics Concern   Not on file  Social History Narrative   Not on file   Social Determinants of Health   Financial Resource Strain: Unknown (01/20/2021)   Overall Financial Resource Strain (CARDIA)    Difficulty of Paying Living Expenses: Patient refused  Food Insecurity: No Food Insecurity (05/04/2022)   Hunger Vital Sign    Worried About Running Out of Food in the Last Year: Never true    Ran Out of Food in the Last Year: Never true  Transportation Needs: No Transportation Needs (05/04/2022)   PRAPARE - Administrator, Civil Service (Medical): No    Lack of Transportation (Non-Medical): No  Physical Activity: Inactive (01/20/2021)   Exercise Vital Sign    Days of Exercise per Week: 1 day    Minutes of Exercise per Session: 0 min  Stress: Stress Concern Present (01/20/2021)   [employer] of Occupational Health - Occupational Stress Questionnaire    Feeling of Stress : Very much  Social Connections: Moderately Isolated (01/20/2021)   Social Connection and Isolation Panel [NHANES]    Frequency of Communication with Friends and Family: Twice a week    Frequency of Social Gatherings with Friends and Family: Never    Attends Religious Services: More than 4 times per year    Active Member of [employer] or Organizations: No    Attends Banker Meetings: Never    Marital Status: Married  Catering manager Violence: Not At Risk (05/04/2022)   Humiliation, Afraid, Rape, and Kick questionnaire    Fear of Current or Ex-Partner: No    Emotionally Abused: No    Physically Abused: No    Sexually Abused: No    FAMILY HISTORY: Family History  Problem Relation Age of Onset   Hypertension Mother    Congenital heart disease Mother  Diabetes Mother    Thyroid disease Brother    Other Daughter        bowel issues   Colon cancer Neg Hx    Colon polyps Neg Hx     ALLERGIES:  is allergic to zithromax [azithromycin] and prednisone.  MEDICATIONS:  Current Outpatient Medications  Medication Sig Dispense Refill   acetaminophen (TYLENOL) 325 MG tablet Take 650 mg by mouth every 6 (six) hours as needed for headache.     albuterol (VENTOLIN HFA) 108 (90 Base) MCG/ACT inhaler Inhale 2 puffs into the lungs every 6 (six) hours as needed for wheezing or shortness of breath. 8 g 0   alendronate (FOSAMAX) 70 MG tablet Take 1 tablet by mouth once a week.     ALPRAZolam (XANAX) 1 MG tablet Take 1 mg by mouth at bedtime. May take additional 1 mg during the day if needed     atorvastatin (LIPITOR) 40 MG tablet Take 40 mg by mouth at bedtime.      benzonatate (TESSALON) 100 MG capsule Take by mouth 3 (three) times daily as needed for cough.     FLUoxetine (PROZAC) 40 MG capsule Take 40 mg by mouth daily.     hydrochlorothiazide (HYDRODIURIL) 25 MG tablet Take 1 tablet (25 mg total)  by mouth daily. 90 tablet 3   lisinopril (ZESTRIL) 10 MG tablet Take 10 mg by mouth daily.     traZODone (DESYREL) 100 MG tablet Take 100 mg by mouth at bedtime.     vitamin B-12 (CYANOCOBALAMIN) 500 MCG tablet Take 1,000 mcg by mouth daily.     No current facility-administered medications for this visit.    REVIEW OF SYSTEMS:   Constitutional: Denies fevers, chills or abnormal night sweats Eyes: Denies blurriness of vision, double vision or watery eyes Ears, nose, mouth, throat, and face: Denies mucositis or sore throat Respiratory: Positive for cough.  Denies any hemoptysis. Cardiovascular: Denies palpitation, chest discomfort or lower extremity swelling Gastrointestinal:  Denies nausea, heartburn or change in bowel habits.  Positive for constipation. Skin: Denies abnormal skin rashes Lymphatics: Denies new lymphadenopathy or easy bruising Neurological:Denies numbness, tingling or new weaknesses Behavioral/Psych: Mood is stable, no new changes  All other systems were reviewed with the patient and are negative.  PHYSICAL EXAMINATION: ECOG PERFORMANCE STATUS: 1 - Symptomatic but completely ambulatory  Vitals:   05/04/22 0815  BP: (!) 149/93  Pulse: 85  Resp: 18  Temp: 98 F (36.7 C)  SpO2: 94%   Filed Weights   05/04/22 0815  Weight: 122 lb 11.2 oz (55.7 kg)    GENERAL:alert, no distress and comfortable SKIN: skin color, texture, turgor are normal, no rashes or significant lesions EYES: normal, conjunctiva are pink and non-injected, sclera clear OROPHARYNX:no exudate, no erythema and lips, buccal mucosa, and tongue normal  NECK: supple, thyroid normal size, non-tender, without nodularity LYMPH:  no palpable lymphadenopathy in the cervical, axillary or inguinal LUNGS: clear to auscultation and percussion with normal breathing effort HEART: regular rate & rhythm and no murmurs and no lower extremity edema ABDOMEN:abdomen soft, non-tender and normal bowel  sounds Musculoskeletal:no cyanosis of digits and no clubbing  PSYCH: alert & oriented x 3 with fluent speech NEURO: no focal motor/sensory deficits  LABORATORY DATA:  I have reviewed the data as listed Lab Results  Component Value Date   WBC 9.2 09/25/2018   HGB 15.1 (H) 09/25/2018   HCT 45.7 09/25/2018   MCV 90.7 09/25/2018   PLT 265 09/25/2018  Chemistry      Component Value Date/Time   NA 136 09/04/2021 0940   NA 141 12/30/2016 1109   K 4.0 09/04/2021 0940   K 4.5 12/30/2016 1109   CL 102 09/04/2021 0940   CO2 26 09/04/2021 0940   CO2 28 12/30/2016 1109   BUN 21 09/04/2021 0940   BUN 5.3 (L) 12/30/2016 1109   CREATININE 1.41 (H) 09/04/2021 0940   CREATININE 0.9 12/30/2016 1109      Component Value Date/Time   CALCIUM 9.3 09/04/2021 0940   CALCIUM 10.1 12/30/2016 1109       RADIOGRAPHIC STUDIES: I have personally reviewed the radiological images as listed and agreed with the findings in the report. CT CHEST WO CONTRAST  Result Date: 04/26/2022 CLINICAL DATA:  Cough.  Lung mass. EXAM: CT CHEST WITHOUT CONTRAST TECHNIQUE: Multidetector CT imaging of the chest was performed following the standard protocol without IV contrast. RADIATION DOSE REDUCTION: This exam was performed according to the departmental dose-optimization program which includes automated exposure control, adjustment of the mA and/or kV according to patient size and/or use of iterative reconstruction technique. COMPARISON:  Chest radiograph 04/17/2022 and chest CT FINDINGS: Cardiovascular: Normal heart size. Aortic atherosclerosis and coronary artery calcifications. No pericardial effusion. Mediastinum/Nodes: Thyroid gland, trachea and esophagus are unremarkable. No enlarged axillary or mediastinal lymph nodes. Hilar lymph nodes are suboptimally evaluated due to lack of IV contrast. Lungs/Pleura: Mild centrilobular and paraseptal emphysema. No pleural fluid or airspace disease. -Subsegmental atelectasis  versus scar noted within the anterior left lower lobe, image 114/4. -Within the anterior right lower lobe, abutting the major fissure, there is a cavitary mass measuring 3.8 by 2.4 by 2.9 cm, image 94/4. The wall thickness of this mass measures up to 6 mm. -subpleural nodule in the medial right upper lobe measures 4 mm, image 32/4. -Calcified granuloma identified within the right middle lobe. Upper Abdomen: No acute abnormality. Right adrenal gland adenoma measures 1.5 cm and -10 Hounsfield units, image 138/3. No follow-up imaging recommended. Musculoskeletal: No chest wall mass or suspicious bone lesions identified. Subacute fracture deformities are noted involving the anterior aspect of the right eighth and ninth ribs, image 135/4. IMPRESSION: 1. There is a 3.8 cm cavitary mass within the anterior right lower lobe, abutting the major fissure. Differential considerations include malignancy (such as squamous cell carcinoma) versus sequelae of inflammation or infection. In the absence of signs/symptoms of infection referral to pulmonary medicine or thoracic surgery is advised for further management. 2. 4 mm subpleural nodule in the medial right upper lobe is nonspecific. Attention on follow-up imaging is advised. 3. Subacute fracture deformities involving the anterior aspect of the right eighth and ninth ribs. 4. Coronary artery calcifications. 5. Aortic Atherosclerosis (ICD10-I70.0) and Emphysema (ICD10-J43.9). These results will be called to the ordering clinician or representative by the Radiologist Assistant, and communication documented in the PACS or [employer]. Electronically Signed   By: Signa Kell M.D.   On: 04/26/2022 09:38   DG Chest 2 View  Result Date: 04/17/2022 CLINICAL DATA:  Cough for 1 month.  Smoker. EXAM: CHEST - 2 VIEW COMPARISON:  03/05/2020 FINDINGS: Heart size appears normal. Mass within the right lower lobe measures 4.3 x 3.3 cm. No pleural fluid or airspace disease. Lungs appear  hyperinflated with diffuse chronic interstitial coarsening. Visualized osseous structures appear intact. IMPRESSION: 1. Right lower lobe mass. Recommend further evaluation with contrast enhanced CT of the chest. Electronically Signed   By: Signa Kell M.D.   On:  04/17/2022 13:24   LONG TERM MONITOR-LIVE TELEMETRY (3-14 DAYS)  Result Date: 04/08/2022 Event monitor analysis time:12 days and 17 hours Rhythm: Patient had a min HR of 57 bpm, max HR of 164 bpm, and avg HR of 82 bpm. Predominant underlying rhythm was Sinus Rhythm. AV block/Pauses: None Afib/flutter: None SVT:23 runs of SVT occurred,  the run with the fastest interval lasting 6 beats with a max rate of 164 bpm, the longest lasting 8 beats with an avg rate of 115 bpm. AT:FTDD PAC:<1% PVC:<1% Patient events: None Conclusion: Normal sinus rhythm with no evidence of pauses or malignant atrial/ventricular arrhythmias.   ASSESSMENT:  1.  Right lower lobe cavitary lung mass: - Baseline cough worsening for 1 month with smoking history. - 18 pound weight loss in the last 3 months due to decreased appetite - Chest x-ray on 04/17/2022: Mass in the right lower lobe. - CT chest (04/26/2022): 3.8 cm cavitary mass in the anterior right lower lobe abutting the major fissure.  4 mm subpleural nodule in the medial right upper lobe is nonspecific.  No adenopathy on noncontrast exam.  2.  Social/family history: - Lives at home with her husband [name]. - She has been on disability since she was diagnosed with cervical cancer.  Prior to that she worked at a [employer].  No chemical exposure. - She is independent of ADLs and IADLs. - Current active smoker and started smoking 1 pack/day at age 69. - Maternal aunt had breast cancer.  3.  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 - XRT 30 Gray in 5 fractions to the vaginal cuff from 02/22/2017 through  03/22/2017.  PLAN:  1.  Right lower lobe cavitary lung mass: - I have discussed imaging findings with the patient and reviewed images. - This is highly suspicious for squamous cell cancer given extensive smoking history. - Recommend PET CT scan and pulmonary evaluation for bronchoscopy and biopsy. - RTC after scans.   Orders Placed This Encounter  Procedures   NM PET Image Initial (PI) Skull Base To Thigh    Standing Status:   Future    Standing Expiration Date:   05/04/2023    Order Specific Question:   If indicated for the ordered procedure, I authorize the administration of a radiopharmaceutical per Radiology protocol    Answer:   Yes    Order Specific Question:   Preferred imaging location?    Answer:   [facility]    Order Specific Question:   Release to patient    Answer:   Immediate   MR Brain W Wo Contrast    Standing Status:   Future    Standing Expiration Date:   05/04/2023    Order Specific Question:   If indicated for the ordered procedure, I authorize the administration of contrast media per Radiology protocol    Answer:   Yes    Order Specific Question:   What is the patient's sedation requirement?    Answer:   No Sedation    Order Specific Question:   Does the patient have a pacemaker or implanted devices?    Answer:   No    Order Specific Question:   Use SRS Protocol?    Answer:   No    Order Specific Question:   Preferred imaging location?    Answer:   [facility] (table limit [phone])    Order Specific Question:  Release to patient    Answer:   Immediate    All questions were answered. The patient knows to call the clinic with any problems, questions or concerns.      Doreatha Massed, MD 05/04/2022 6:12 PM

## 2022-05-04 NOTE — Progress Notes (Signed)
I met with the patient and her husband today during and following initial visit with Dr. Katragadda. I introduced myself and explained my role in the patient's care. I provided my contact information and encouraged the patient to call with questions or concerns. 

## 2022-05-04 NOTE — Progress Notes (Unsigned)
Synopsis: Referred in January 2024 for lung mass by Derek Jack, MD  Subjective:   PATIENT ID: [name] GENDER: female DOB: 1956/01/29, MRN: [id number]  Chief Complaint  Patient presents with   Consult    Lung Mass    This is a 67 year old female, past medical history of depression, cervical cancer, radiation treatments.  History of prior abdominal surgeries.  Patient is a current every day smoker with a greater than 40-pack-year history. Patient has a CT scan of the chest on 04/26/2022 patient has a 3.8 cm cavitary mass in the anterior right lower lobe abutting the major fissure concerning for malignancy.  Patient was referred for consideration of bronchoscopy and biopsy.    Past Medical History:  Diagnosis Date   Anxiety    Arthritis    spinal stenosis   Back disorder    "crooked spine"   Bulging lumbar disc    Cervical cancer (HCC)    Complication of anesthesia    ? hypotension 10/2016 at [facility] surgery    Depression    H/O degenerative disc disease    History of radiation therapy 02/22/17-03/22/17   vaginal cuff 30 Gy in 5 fractions   Hypertension    Hypothyroidism    patient taken off of hypothyroid med in 11/2016    Osteoporosis    Thyroid disease      Family History  Problem Relation Age of Onset   Hypertension Mother    Congenital heart disease Mother    Diabetes Mother    Thyroid disease Brother    Other Daughter        bowel issues   Colon cancer Neg Hx    Colon polyps Neg Hx      Past Surgical History:  Procedure Laterality Date   ABDOMINAL EXPOSURE N/A 09/27/2018   Procedure: ABDOMINAL EXPOSURE;  Surgeon: Serafina Mitchell, MD;  Location: [facility];  Service: Vascular;  Laterality: N/A;   ANTERIOR AND POSTERIOR REPAIR N/A 11/09/2016   Procedure: ANTERIOR (CYSTOCELE) AND POSTERIOR REPAIR (RECTOCELE);  Surgeon: Jonnie Kind, MD;  Location: [facility];  Service: Gynecology;  Laterality: N/A;   ANTERIOR LUMBAR FUSION Left 09/27/2018    Procedure: ANTERIOR LUMBAR FUSION L5-S1,;  Surgeon: Melina Schools, MD;  Location: [place];  Service: Orthopedics;  Laterality: Left;  4.5 HRS/ DR. Trula Slade ASSIST   BILATERAL SALPINGECTOMY Left 11/09/2016   Procedure: LEFT SALPINGECTOMY;  Surgeon: Jonnie Kind, MD;  Location: [facility];  Service: Gynecology;  Laterality: Left;   CATARACT EXTRACTION W/PHACO Right 07/05/2016   Procedure: CATARACT EXTRACTION PHACO AND INTRAOCULAR LENS PLACEMENT (IOC);  Surgeon: Tonny Branch, MD;  Location: [facility];  Service: Ophthalmology;  Laterality: Right;  CDE: 15.41   CATARACT EXTRACTION W/PHACO Left 07/19/2016   Procedure: CATARACT EXTRACTION PHACO AND INTRAOCULAR LENS PLACEMENT LEFT EYE CDE= 12.65;  Surgeon: Tonny Branch, MD;  Location: [facility];  Service: Ophthalmology;  Laterality: Left;  left   COLONOSCOPY WITH PROPOFOL N/A 09/09/2021   Procedure: COLONOSCOPY WITH PROPOFOL;  Surgeon: Daneil Dolin, MD;  [facility];  Service: Endoscopy;  Laterality: N/A;  10:30am   LUMBAR LAMINECTOMY/DECOMPRESSION MICRODISCECTOMY Left 09/27/2018   Procedure: L4-L5 Lumbar Laminectomy/Decompression;  Surgeon: Melina Schools, MD;  Location: [place];  Service: Orthopedics;  Laterality: Left;   POLYPECTOMY  09/09/2021   Procedure: POLYPECTOMY;  Surgeon: Daneil Dolin, MD;  [facility]: [facility];  Service: Endoscopy;;   ROBOTIC PELVIC AND PARA-AORTIC LYMPH NODE DISSECTION N/A 01/11/2017   Procedure:  XI ROBOTIC BILATERAL PELVIC LYMPH NODE DISSECTION;  Surgeon: Adolphus Birchwood, MD;  Location: [facility];  Service: Gynecology;  Laterality: N/A;   SALPINGOOPHORECTOMY Right 11/09/2016   Procedure: RIGHT SALPINGO OOPHORECTOMY;  Surgeon: Tilda Burrow, MD;  Location: [facility];  Service: Gynecology;  Laterality: Right;   TUBAL LIGATION     VAGINAL HYSTERECTOMY N/A 11/09/2016   Procedure: HYSTERECTOMY VAGINAL;  Surgeon: Tilda Burrow, MD;  Location: [facility];  Service: Gynecology;  Laterality: N/A;    Social History   Socioeconomic History    Marital status: Legally Separated    Spouse name: Not on file   Number of children: 1   Years of education: Not on file   Highest education level: Not on file  Occupational History   Not on file  Tobacco Use   Smoking status: Every Day    Packs/day: 1.00    Years: 44.00    Total pack years: 44.00    Types: Cigarettes   Smokeless tobacco: Never   Tobacco comments:    Smokes 7 packs of cigarettes a week. 05/05/2022 [place]  Vaping Use   Vaping Use: Never used  Substance and Sexual Activity   Alcohol use: No    Comment: 01-06-2016 Per pt rarely, 02-05-2016 per pt no but 71yrs ago     Drug use: No    Comment: 02-05-2016 per pt no but about 40 yrs ago   Sexual activity: Not Currently    Birth control/protection: Surgical    Comment: hyst  Other Topics Concern   Not on file  Social History Narrative   Not on file   Social Determinants of Health   Financial Resource Strain: Unknown (01/20/2021)   Overall Financial Resource Strain (CARDIA)    Difficulty of Paying Living Expenses: Patient refused  Food Insecurity: No Food Insecurity (05/04/2022)   Hunger Vital Sign    Worried About Running Out of Food in the Last Year: Never true    Ran Out of Food in the Last Year: Never true  Transportation Needs: No Transportation Needs (05/04/2022)   PRAPARE - Administrator, Civil Service (Medical): No    Lack of Transportation (Non-Medical): No  Physical Activity: Inactive (01/20/2021)   Exercise Vital Sign    Days of Exercise per Week: 1 day    Minutes of Exercise per Session: 0 min  Stress: Stress Concern Present (01/20/2021)   [employer] of Occupational Health - Occupational Stress Questionnaire    Feeling of Stress : Very much  Social Connections: Moderately Isolated (01/20/2021)   Social Connection and Isolation Panel [NHANES]    Frequency of Communication with Friends and Family: Twice a week    Frequency of Social Gatherings with Friends and Family: Never     Attends Religious Services: More than 4 times per year    Active Member of [employer] or Organizations: No    Attends Banker Meetings: Never    Marital Status: Married  Catering manager Violence: Not At Risk (05/04/2022)   Humiliation, Afraid, Rape, and Kick questionnaire    Fear of Current or Ex-Partner: No    Emotionally Abused: No    Physically Abused: No    Sexually Abused: No     Allergies  Allergen Reactions   Zithromax [Azithromycin] Rash and Other (See Comments)    Blisters in mouth    Prednisone Nausea Only    Sick to stomach     Outpatient Medications Prior to Visit  Medication Sig Dispense Refill  albuterol (VENTOLIN HFA) 108 (90 Base) MCG/ACT inhaler Inhale 2 puffs into the lungs every 6 (six) hours as needed for wheezing or shortness of breath. 8 g 0   benzonatate (TESSALON) 100 MG capsule Take by mouth 3 (three) times daily as needed for cough.     acetaminophen (TYLENOL) 325 MG tablet Take 650 mg by mouth every 6 (six) hours as needed for headache.     alendronate (FOSAMAX) 70 MG tablet Take 1 tablet by mouth once a week.     ALPRAZolam (XANAX) 1 MG tablet Take 1 mg by mouth at bedtime. May take additional 1 mg during the day if needed     atorvastatin (LIPITOR) 40 MG tablet Take 40 mg by mouth at bedtime.      FLUoxetine (PROZAC) 40 MG capsule Take 40 mg by mouth daily.     hydrochlorothiazide (HYDRODIURIL) 25 MG tablet Take 1 tablet (25 mg total) by mouth daily. 90 tablet 3   lisinopril (ZESTRIL) 10 MG tablet Take 10 mg by mouth daily.     traZODone (DESYREL) 100 MG tablet Take 100 mg by mouth at bedtime.     vitamin B-12 (CYANOCOBALAMIN) 500 MCG tablet Take 1,000 mcg by mouth daily.     No facility-administered medications prior to visit.    Review of Systems  Constitutional:  Negative for chills, fever, malaise/fatigue and weight loss.  HENT:  Negative for hearing loss, sore throat and tinnitus.   Eyes:  Negative for blurred vision and double  vision.  Respiratory:  Positive for cough and shortness of breath. Negative for hemoptysis, sputum production, wheezing and stridor.   Cardiovascular:  Negative for chest pain, palpitations, orthopnea, leg swelling and PND.  Gastrointestinal:  Negative for abdominal pain, constipation, diarrhea, heartburn, nausea and vomiting.  Genitourinary:  Negative for dysuria, hematuria and urgency.  Musculoskeletal:  Negative for joint pain and myalgias.  Skin:  Negative for itching and rash.  Neurological:  Negative for dizziness, tingling, weakness and headaches.  Endo/Heme/Allergies:  Negative for environmental allergies. Does not bruise/bleed easily.  Psychiatric/Behavioral:  Negative for depression. The patient is not nervous/anxious and does not have insomnia.   All other systems reviewed and are negative.    Objective:  Physical Exam Vitals reviewed.  Constitutional:      General: She is not in acute distress.    Appearance: She is well-developed.  HENT:     Head: Normocephalic and atraumatic.     Mouth/Throat:     Pharynx: No oropharyngeal exudate.  Eyes:     General: No scleral icterus.    Conjunctiva/sclera: Conjunctivae normal.     Pupils: Pupils are equal, round, and reactive to light.  Neck:     Vascular: No JVD.     Trachea: No tracheal deviation.     Comments: Loss of supraclavicular fat Cardiovascular:     Rate and Rhythm: Normal rate and regular rhythm.     Heart sounds: Normal heart sounds, S1 normal and S2 normal. No murmur heard.    Comments: Distant heart tones Pulmonary:     Effort: Pulmonary effort is normal. No tachypnea, accessory muscle usage or respiratory distress.     Breath sounds: No stridor. Decreased breath sounds (throughout all lung fields) present. No wheezing, rhonchi or rales.  Abdominal:     General: Bowel sounds are normal. There is no distension.     Palpations: Abdomen is soft.     Tenderness: There is no abdominal tenderness.   Musculoskeletal:  General: Deformity (muscle wasting ) present. No tenderness.     Cervical back: Neck supple.  Lymphadenopathy:     Cervical: No cervical adenopathy.  Skin:    General: Skin is warm and dry.     Capillary Refill: Capillary refill takes less than 2 seconds.     Findings: No rash.  Neurological:     Mental Status: She is alert and oriented to person, place, and time.  Psychiatric:        Behavior: Behavior normal.      Vitals:   05/05/22 1032  BP: 130/82  Pulse: 83  SpO2: 97%  Weight: 125 lb 9.6 oz (57 kg)  Height: 5\' 5"  (1.651 m)   97% on RA BMI Readings from Last 3 Encounters:  05/05/22 20.90 kg/m  05/04/22 20.11 kg/m  04/22/22 20.34 kg/m   Wt Readings from Last 3 Encounters:  05/05/22 125 lb 9.6 oz (57 kg)  05/04/22 122 lb 11.2 oz (55.7 kg)  04/22/22 126 lb (57.2 kg)     CBC    Component Value Date/Time   WBC 9.2 09/25/2018 1139   RBC 5.04 09/25/2018 1139   HGB 15.1 (H) 09/25/2018 1139   HCT 45.7 09/25/2018 1139   PLT 265 09/25/2018 1139   MCV 90.7 09/25/2018 1139   MCH 30.0 09/25/2018 1139   MCHC 33.0 09/25/2018 1139   RDW 12.6 09/25/2018 1139   LYMPHSABS 2.8 07/01/2016 1323   MONOABS 0.5 07/01/2016 1323   EOSABS 0.2 07/01/2016 1323   BASOSABS 0.1 07/01/2016 1323     Chest Imaging: CT chest January 2024: 3.8 cm cavitary lung mass concerning for malignancy. The patient's images have been independently reviewed by me.    Pulmonary Functions Testing Results:     No data to display          FeNO:   Pathology:   Echocardiogram:   Heart Catheterization:     Assessment & Plan:     ICD-10-CM   1. Cavitating mass in right lower lung lobe  J98.4 Procedural/ Surgical Case Request: ROBOTIC ASSISTED NAVIGATIONAL BRONCHOSCOPY, VIDEO BRONCHOSCOPY WITH ENDOBRONCHIAL ULTRASOUND    Ambulatory referral to Pulmonology    Pulmonary Function Test    2. Lung nodule  R91.1     3. Tobacco use  Z72.0        Discussion:  This is a 67 year old female seen today for abnormal CT imaging with cavitating mass in the right lower lung.  Also has another small nodule in the right chest.  Longtime history of smoking.  Not on any inhaler regimen at this time uses.  Albuterol.  Plan: Patient was counseled on smoking cessation today. We talked about the pros cons risk and benefit of undergoing robotic assisted navigational bronchoscopy with tissue sampling. Patient is agreeable to proceed. We talked about potential dates. We talked about risk of bleeding and pneumothorax. Patient is a agreeable for next week on 05/13/2022 for bronchoscopy.  Additional time spent today talking about smoking cessation.  She is agreeable to start trying to taper. Will also obtain pulmonary function test hopefully can get this done in a short period.  When she gets pulmonary function test and after her bronchoscopy if concerning for malignancy and she potentially qualifies for surgery we will get her over to see thoracic surgery.  She has PET scan scheduled for tomorrow.    Current Outpatient Medications:    albuterol (VENTOLIN HFA) 108 (90 Base) MCG/ACT inhaler, Inhale 2 puffs into the lungs every 6 (six)  hours as needed for wheezing or shortness of breath., Disp: 8 g, Rfl: 0   benzonatate (TESSALON) 100 MG capsule, Take by mouth 3 (three) times daily as needed for cough., Disp: , Rfl:    acetaminophen (TYLENOL) 325 MG tablet, Take 650 mg by mouth every 6 (six) hours as needed for headache., Disp: , Rfl:    alendronate (FOSAMAX) 70 MG tablet, Take 1 tablet by mouth once a week., Disp: , Rfl:    ALPRAZolam (XANAX) 1 MG tablet, Take 1 mg by mouth at bedtime. May take additional 1 mg during the day if needed, Disp: , Rfl:    atorvastatin (LIPITOR) 40 MG tablet, Take 40 mg by mouth at bedtime. , Disp: , Rfl:    FLUoxetine (PROZAC) 40 MG capsule, Take 40 mg by mouth daily., Disp: , Rfl:    hydrochlorothiazide (HYDRODIURIL) 25  MG tablet, Take 1 tablet (25 mg total) by mouth daily., Disp: 90 tablet, Rfl: 3   lisinopril (ZESTRIL) 10 MG tablet, Take 10 mg by mouth daily., Disp: , Rfl:    traZODone (DESYREL) 100 MG tablet, Take 100 mg by mouth at bedtime., Disp: , Rfl:    vitamin B-12 (CYANOCOBALAMIN) 500 MCG tablet, Take 1,000 mcg by mouth daily., Disp: , Rfl:   I spent 61 minutes dedicated to the care of this patient on the date of this encounter to include pre-visit review of records, face-to-face time with the patient discussing conditions above, post visit ordering of testing, clinical documentation with the electronic health record, making appropriate referrals as documented, and communicating necessary findings to members of the patients care team.   Josephine Igo, DO [facility] 05/05/2022 11:47 AM

## 2022-05-04 NOTE — H&P (View-Only) (Signed)
Synopsis: Referred in January 2024 for lung mass by Derek Jack, MD  Subjective:   PATIENT ID: [name] GENDER: female DOB: 08-12-1955, MRN: [id number]  Chief Complaint  Patient presents with   Consult    Lung Mass    This is a 67 year old female, past medical history of depression, cervical cancer, radiation treatments.  History of prior abdominal surgeries.  Patient is a current every day smoker with a greater than 40-pack-year history. Patient has a CT scan of the chest on 04/26/2022 patient has a 3.8 cm cavitary mass in the anterior right lower lobe abutting the major fissure concerning for malignancy.  Patient was referred for consideration of bronchoscopy and biopsy.    Past Medical History:  Diagnosis Date   Anxiety    Arthritis    spinal stenosis   Back disorder    "crooked spine"   Bulging lumbar disc    Cervical cancer (HCC)    Complication of anesthesia    ? hypotension 10/2016 at [facility] surgery    Depression    H/O degenerative disc disease    History of radiation therapy 02/22/17-03/22/17   vaginal cuff 30 Gy in 5 fractions   Hypertension    Hypothyroidism    patient taken off of hypothyroid med in 11/2016    Osteoporosis    Thyroid disease      Family History  Problem Relation Age of Onset   Hypertension Mother    Congenital heart disease Mother    Diabetes Mother    Thyroid disease Brother    Other Daughter        bowel issues   Colon cancer Neg Hx    Colon polyps Neg Hx      Past Surgical History:  Procedure Laterality Date   ABDOMINAL EXPOSURE N/A 09/27/2018   Procedure: ABDOMINAL EXPOSURE;  Surgeon: Serafina Mitchell, MD;  Location: [facility];  Service: Vascular;  Laterality: N/A;   ANTERIOR AND POSTERIOR REPAIR N/A 11/09/2016   Procedure: ANTERIOR (CYSTOCELE) AND POSTERIOR REPAIR (RECTOCELE);  Surgeon: Jonnie Kind, MD;  Location: [facility];  Service: Gynecology;  Laterality: N/A;   ANTERIOR LUMBAR FUSION Left 09/27/2018    Procedure: ANTERIOR LUMBAR FUSION L5-S1,;  Surgeon: Melina Schools, MD;  Location: [place];  Service: Orthopedics;  Laterality: Left;  4.5 HRS/ DR. Trula Slade ASSIST   BILATERAL SALPINGECTOMY Left 11/09/2016   Procedure: LEFT SALPINGECTOMY;  Surgeon: Jonnie Kind, MD;  Location: [facility];  Service: Gynecology;  Laterality: Left;   CATARACT EXTRACTION W/PHACO Right 07/05/2016   Procedure: CATARACT EXTRACTION PHACO AND INTRAOCULAR LENS PLACEMENT (IOC);  Surgeon: Tonny Branch, MD;  Location: [facility];  Service: Ophthalmology;  Laterality: Right;  CDE: 15.41   CATARACT EXTRACTION W/PHACO Left 07/19/2016   Procedure: CATARACT EXTRACTION PHACO AND INTRAOCULAR LENS PLACEMENT LEFT EYE CDE= 12.65;  Surgeon: Tonny Branch, MD;  Location: [facility];  Service: Ophthalmology;  Laterality: Left;  left   COLONOSCOPY WITH PROPOFOL N/A 09/09/2021   Procedure: COLONOSCOPY WITH PROPOFOL;  Surgeon: Daneil Dolin, MD;  [facility];  Service: Endoscopy;  Laterality: N/A;  10:30am   LUMBAR LAMINECTOMY/DECOMPRESSION MICRODISCECTOMY Left 09/27/2018   Procedure: L4-L5 Lumbar Laminectomy/Decompression;  Surgeon: Melina Schools, MD;  Location: [place];  Service: Orthopedics;  Laterality: Left;   POLYPECTOMY  09/09/2021   Procedure: POLYPECTOMY;  Surgeon: Daneil Dolin, MD;  [facility];  Service: Endoscopy;;   ROBOTIC PELVIC AND PARA-AORTIC LYMPH NODE DISSECTION N/A 01/11/2017   Procedure:  XI ROBOTIC BILATERAL PELVIC LYMPH NODE DISSECTION;  Surgeon: Adolphus Birchwood, MD;  Location: [facility];  Service: Gynecology;  Laterality: N/A;   SALPINGOOPHORECTOMY Right 11/09/2016   Procedure: RIGHT SALPINGO OOPHORECTOMY;  Surgeon: Tilda Burrow, MD;  Location: [facility];  Service: Gynecology;  Laterality: Right;   TUBAL LIGATION     VAGINAL HYSTERECTOMY N/A 11/09/2016   Procedure: HYSTERECTOMY VAGINAL;  Surgeon: Tilda Burrow, MD;  Location: [facility];  Service: Gynecology;  Laterality: N/A;    Social History   Socioeconomic History    Marital status: Legally Separated    Spouse name: Not on file   Number of children: 1   Years of education: Not on file   Highest education level: Not on file  Occupational History   Not on file  Tobacco Use   Smoking status: Every Day    Packs/day: 1.00    Years: 44.00    Total pack years: 44.00    Types: Cigarettes   Smokeless tobacco: Never   Tobacco comments:    Smokes 7 packs of cigarettes a week. 05/05/2022 Tay  Vaping Use   Vaping Use: Never used  Substance and Sexual Activity   Alcohol use: No    Comment: 01-06-2016 Per pt rarely, 02-05-2016 per pt no but 64yrs ago     Drug use: No    Comment: 02-05-2016 per pt no but about 40 yrs ago   Sexual activity: Not Currently    Birth control/protection: Surgical    Comment: hyst  Other Topics Concern   Not on file  Social History Narrative   Not on file   Social Determinants of Health   Financial Resource Strain: Unknown (01/20/2021)   Overall Financial Resource Strain (CARDIA)    Difficulty of Paying Living Expenses: Patient refused  Food Insecurity: No Food Insecurity (05/04/2022)   Hunger Vital Sign    Worried About Running Out of Food in the Last Year: Never true    Ran Out of Food in the Last Year: Never true  Transportation Needs: No Transportation Needs (05/04/2022)   PRAPARE - Administrator, Civil Service (Medical): No    Lack of Transportation (Non-Medical): No  Physical Activity: Inactive (01/20/2021)   Exercise Vital Sign    Days of Exercise per Week: 1 day    Minutes of Exercise per Session: 0 min  Stress: Stress Concern Present (01/20/2021)   [employer] of Occupational Health - Occupational Stress Questionnaire    Feeling of Stress : Very much  Social Connections: Moderately Isolated (01/20/2021)   Social Connection and Isolation Panel [NHANES]    Frequency of Communication with Friends and Family: Twice a week    Frequency of Social Gatherings with Friends and Family: Never     Attends Religious Services: More than 4 times per year    Active Member of [employer] or Organizations: No    Attends Banker Meetings: Never    Marital Status: Married  Catering manager Violence: Not At Risk (05/04/2022)   Humiliation, Afraid, Rape, and Kick questionnaire    Fear of Current or Ex-Partner: No    Emotionally Abused: No    Physically Abused: No    Sexually Abused: No     Allergies  Allergen Reactions   Zithromax [Azithromycin] Rash and Other (See Comments)    Blisters in mouth    Prednisone Nausea Only    Sick to stomach     Outpatient Medications Prior to Visit  Medication Sig Dispense Refill  albuterol (VENTOLIN HFA) 108 (90 Base) MCG/ACT inhaler Inhale 2 puffs into the lungs every 6 (six) hours as needed for wheezing or shortness of breath. 8 g 0   benzonatate (TESSALON) 100 MG capsule Take by mouth 3 (three) times daily as needed for cough.     acetaminophen (TYLENOL) 325 MG tablet Take 650 mg by mouth every 6 (six) hours as needed for headache.     alendronate (FOSAMAX) 70 MG tablet Take 1 tablet by mouth once a week.     ALPRAZolam (XANAX) 1 MG tablet Take 1 mg by mouth at bedtime. May take additional 1 mg during the day if needed     atorvastatin (LIPITOR) 40 MG tablet Take 40 mg by mouth at bedtime.      FLUoxetine (PROZAC) 40 MG capsule Take 40 mg by mouth daily.     hydrochlorothiazide (HYDRODIURIL) 25 MG tablet Take 1 tablet (25 mg total) by mouth daily. 90 tablet 3   lisinopril (ZESTRIL) 10 MG tablet Take 10 mg by mouth daily.     traZODone (DESYREL) 100 MG tablet Take 100 mg by mouth at bedtime.     vitamin B-12 (CYANOCOBALAMIN) 500 MCG tablet Take 1,000 mcg by mouth daily.     No facility-administered medications prior to visit.    Review of Systems  Constitutional:  Negative for chills, fever, malaise/fatigue and weight loss.  HENT:  Negative for hearing loss, sore throat and tinnitus.   Eyes:  Negative for blurred vision and double  vision.  Respiratory:  Positive for cough and shortness of breath. Negative for hemoptysis, sputum production, wheezing and stridor.   Cardiovascular:  Negative for chest pain, palpitations, orthopnea, leg swelling and PND.  Gastrointestinal:  Negative for abdominal pain, constipation, diarrhea, heartburn, nausea and vomiting.  Genitourinary:  Negative for dysuria, hematuria and urgency.  Musculoskeletal:  Negative for joint pain and myalgias.  Skin:  Negative for itching and rash.  Neurological:  Negative for dizziness, tingling, weakness and headaches.  Endo/Heme/Allergies:  Negative for environmental allergies. Does not bruise/bleed easily.  Psychiatric/Behavioral:  Negative for depression. The patient is not nervous/anxious and does not have insomnia.   All other systems reviewed and are negative.    Objective:  Physical Exam Vitals reviewed.  Constitutional:      General: She is not in acute distress.    Appearance: She is well-developed.  HENT:     Head: Normocephalic and atraumatic.     Mouth/Throat:     Pharynx: No oropharyngeal exudate.  Eyes:     General: No scleral icterus.    Conjunctiva/sclera: Conjunctivae normal.     Pupils: Pupils are equal, round, and reactive to light.  Neck:     Vascular: No JVD.     Trachea: No tracheal deviation.     Comments: Loss of supraclavicular fat Cardiovascular:     Rate and Rhythm: Normal rate and regular rhythm.     Heart sounds: Normal heart sounds, S1 normal and S2 normal. No murmur heard.    Comments: Distant heart tones Pulmonary:     Effort: Pulmonary effort is normal. No tachypnea, accessory muscle usage or respiratory distress.     Breath sounds: No stridor. Decreased breath sounds (throughout all lung fields) present. No wheezing, rhonchi or rales.  Abdominal:     General: Bowel sounds are normal. There is no distension.     Palpations: Abdomen is soft.     Tenderness: There is no abdominal tenderness.   Musculoskeletal:  General: Deformity (muscle wasting ) present. No tenderness.     Cervical back: Neck supple.  Lymphadenopathy:     Cervical: No cervical adenopathy.  Skin:    General: Skin is warm and dry.     Capillary Refill: Capillary refill takes less than 2 seconds.     Findings: No rash.  Neurological:     Mental Status: She is alert and oriented to person, place, and time.  Psychiatric:        Behavior: Behavior normal.      Vitals:   05/05/22 1032  BP: 130/82  Pulse: 83  SpO2: 97%  Weight: 125 lb 9.6 oz (57 kg)  Height: 5\' 5"  (1.651 m)   97% on RA BMI Readings from Last 3 Encounters:  05/05/22 20.90 kg/m  05/04/22 20.11 kg/m  04/22/22 20.34 kg/m   Wt Readings from Last 3 Encounters:  05/05/22 125 lb 9.6 oz (57 kg)  05/04/22 122 lb 11.2 oz (55.7 kg)  04/22/22 126 lb (57.2 kg)     CBC    Component Value Date/Time   WBC 9.2 09/25/2018 1139   RBC 5.04 09/25/2018 1139   HGB 15.1 (H) 09/25/2018 1139   HCT 45.7 09/25/2018 1139   PLT 265 09/25/2018 1139   MCV 90.7 09/25/2018 1139   MCH 30.0 09/25/2018 1139   MCHC 33.0 09/25/2018 1139   RDW 12.6 09/25/2018 1139   LYMPHSABS 2.8 07/01/2016 1323   MONOABS 0.5 07/01/2016 1323   EOSABS 0.2 07/01/2016 1323   BASOSABS 0.1 07/01/2016 1323     Chest Imaging: CT chest January 2024: 3.8 cm cavitary lung mass concerning for malignancy. The patient's images have been independently reviewed by me.    Pulmonary Functions Testing Results:     No data to display          FeNO:   Pathology:   Echocardiogram:   Heart Catheterization:     Assessment & Plan:     ICD-10-CM   1. Cavitating mass in right lower lung lobe  J98.4 Procedural/ Surgical Case Request: ROBOTIC ASSISTED NAVIGATIONAL BRONCHOSCOPY, VIDEO BRONCHOSCOPY WITH ENDOBRONCHIAL ULTRASOUND    Ambulatory referral to Pulmonology    Pulmonary Function Test    2. Lung nodule  R91.1     3. Tobacco use  Z72.0        Discussion:  This is a 67 year old female seen today for abnormal CT imaging with cavitating mass in the right lower lung.  Also has another small nodule in the right chest.  Longtime history of smoking.  Not on any inhaler regimen at this time uses.  Albuterol.  Plan: Patient was counseled on smoking cessation today. We talked about the pros cons risk and benefit of undergoing robotic assisted navigational bronchoscopy with tissue sampling. Patient is agreeable to proceed. We talked about potential dates. We talked about risk of bleeding and pneumothorax. Patient is a agreeable for next week on 05/13/2022 for bronchoscopy.  Additional time spent today talking about smoking cessation.  She is agreeable to start trying to taper. Will also obtain pulmonary function test hopefully can get this done in a short period.  When she gets pulmonary function test and after her bronchoscopy if concerning for malignancy and she potentially qualifies for surgery we will get her over to see thoracic surgery.  She has PET scan scheduled for tomorrow.    Current Outpatient Medications:    albuterol (VENTOLIN HFA) 108 (90 Base) MCG/ACT inhaler, Inhale 2 puffs into the lungs every 6 (six)  hours as needed for wheezing or shortness of breath., Disp: 8 g, Rfl: 0   benzonatate (TESSALON) 100 MG capsule, Take by mouth 3 (three) times daily as needed for cough., Disp: , Rfl:    acetaminophen (TYLENOL) 325 MG tablet, Take 650 mg by mouth every 6 (six) hours as needed for headache., Disp: , Rfl:    alendronate (FOSAMAX) 70 MG tablet, Take 1 tablet by mouth once a week., Disp: , Rfl:    ALPRAZolam (XANAX) 1 MG tablet, Take 1 mg by mouth at bedtime. May take additional 1 mg during the day if needed, Disp: , Rfl:    atorvastatin (LIPITOR) 40 MG tablet, Take 40 mg by mouth at bedtime. , Disp: , Rfl:    FLUoxetine (PROZAC) 40 MG capsule, Take 40 mg by mouth daily., Disp: , Rfl:    hydrochlorothiazide (HYDRODIURIL) 25  MG tablet, Take 1 tablet (25 mg total) by mouth daily., Disp: 90 tablet, Rfl: 3   lisinopril (ZESTRIL) 10 MG tablet, Take 10 mg by mouth daily., Disp: , Rfl:    traZODone (DESYREL) 100 MG tablet, Take 100 mg by mouth at bedtime., Disp: , Rfl:    vitamin B-12 (CYANOCOBALAMIN) 500 MCG tablet, Take 1,000 mcg by mouth daily., Disp: , Rfl:   I spent 61 minutes dedicated to the care of this patient on the date of this encounter to include pre-visit review of records, face-to-face time with the patient discussing conditions above, post visit ordering of testing, clinical documentation with the electronic health record, making appropriate referrals as documented, and communicating necessary findings to members of the patients care team.   Josephine Igo, DO [facility] 05/05/2022 11:47 AM

## 2022-05-04 NOTE — Patient Instructions (Addendum)
[facility]  Discharge Instructions  You were seen and examined today by Dr. Ellin Saba. Dr. Ellin Saba is a medical oncologist, meaning that he specializes in the treatment of cancer diagnoses. Dr. Ellin Saba discussed your past medical history, family history of cancers, and the events that led to you being here today.  You were referred to Dr. Ellin Saba due to an abnormal CT scan which revealed a mass of your right lung concerning for cancer.  Dr. Ellin Saba has recommended a PET scan and a brain MRI. A PET scan is a specialized CT scan that goes from ear lobes to knee cap for cancer staging purposes. The brain MRI is also for cancer staging and is done for all patients who have a lung mass.  You will also need a biopsy. A pulmonologist in [place] can do a bronchoscopy.  Follow-up as scheduled.  Thank you for choosing [facility] to provide your oncology and hematology care.   To afford each patient quality time with our provider, please arrive at least 15 minutes before your scheduled appointment time. You may need to reschedule your appointment if you arrive late (10 or more minutes). Arriving late affects you and other patients whose appointments are after yours.  Also, if you miss three or more appointments without notifying the office, you may be dismissed from the clinic at the provider's discretion.    Again, thank you for choosing [facility].  Our hope is that these requests will decrease the amount of time that you wait before being seen by our physicians.   If you have a lab appointment with the Cancer Center please come in thru the Main Entrance and check in at the main information desk.           _____________________________________________________________  Should you have questions after your visit to [facility], please contact our office at [phone] and follow the prompts.  Our  office hours are 8:00 a.m. to 4:30 p.m. Monday - Thursday and 8:00 a.m. to 2:30 p.m. Friday.  Please note that voicemails left after 4:00 p.m. may not be returned until the following business day.  We are closed weekends and all major holidays.  You do have access to a nurse 24-7, just call the main number to the clinic [phone] and do not press any options, hold on the line and a nurse will answer the phone.    For prescription refill requests, have your pharmacy contact our office and allow 72 hours.    Masks are optional in the cancer centers. If you would like for your care team to wear a mask while they are taking care of you, please let them know. You may have one support person who is at least 67 years old accompany you for your appointments.

## 2022-05-05 ENCOUNTER — Encounter: Payer: Self-pay | Admitting: Pulmonary Disease

## 2022-05-05 ENCOUNTER — Other Ambulatory Visit: Payer: Self-pay | Admitting: Pulmonary Disease

## 2022-05-05 ENCOUNTER — Ambulatory Visit: Payer: PPO | Admitting: Pulmonary Disease

## 2022-05-05 ENCOUNTER — Telehealth: Payer: Self-pay | Admitting: Pulmonary Disease

## 2022-05-05 VITALS — BP 130/82 | HR 83 | Ht 65.0 in | Wt 125.6 lb

## 2022-05-05 DIAGNOSIS — R911 Solitary pulmonary nodule: Secondary | ICD-10-CM

## 2022-05-05 DIAGNOSIS — J984 Other disorders of lung: Secondary | ICD-10-CM

## 2022-05-05 DIAGNOSIS — Z720 Tobacco use: Secondary | ICD-10-CM | POA: Diagnosis not present

## 2022-05-05 NOTE — Telephone Encounter (Signed)
Was Trellegy called in? Pls call PT to advise @ [phone]  (AVS Notes: Trelegy 100 samples and new prescription )   Pharm: [facility] (She called and they had not rec'd)

## 2022-05-05 NOTE — Patient Instructions (Addendum)
Thank you for visiting Dr. Valeta Harms at [facility]. Today we recommend the following:  Orders Placed This Encounter  Procedures   Procedural/ Surgical Case Request: ROBOTIC ASSISTED NAVIGATIONAL BRONCHOSCOPY, VIDEO BRONCHOSCOPY WITH ENDOBRONCHIAL ULTRASOUND   Ambulatory referral to Pulmonology   Pulmonary Function Test   Trelegy 100 samples and new prescription   Bronchoscopy on 05/13/2022  Return in about 15 days (around 05/20/2022) for w/ Eric Form, NP .    Please do your part to reduce the spread of COVID-19.

## 2022-05-06 ENCOUNTER — Encounter (HOSPITAL_COMMUNITY)
Admission: RE | Admit: 2022-05-06 | Discharge: 2022-05-06 | Disposition: A | Discharge status: Home / Self Care | Payer: PPO | Source: Ambulatory Visit | Attending: Hematology | Admitting: Hematology

## 2022-05-06 DIAGNOSIS — C3490 Malignant neoplasm of unspecified part of unspecified bronchus or lung: Secondary | ICD-10-CM | POA: Insufficient documentation

## 2022-05-06 MED ORDER — FLUDEOXYGLUCOSE F - 18 (FDG) INJECTION
6.7100 | Freq: Once | INTRAVENOUS | Status: AC | PRN
  Administered 2022-05-06: 6.71 via INTRAVENOUS

## 2022-05-06 MED ORDER — TRELEGY ELLIPTA 100-62.5-25 MCG/ACT IN AEPB: 1.0000 | INHALATION_SPRAY | Freq: Every day | RESPIRATORY_TRACT | 2 refills | Status: DC

## 2022-05-06 NOTE — Telephone Encounter (Signed)
Sent Trelegy 100 MG in to pt pharmacy. Nothing further needed.

## 2022-05-12 ENCOUNTER — Other Ambulatory Visit: Payer: Self-pay

## 2022-05-12 ENCOUNTER — Encounter (HOSPITAL_COMMUNITY): Payer: Self-pay | Admitting: Pulmonary Disease

## 2022-05-12 NOTE — Progress Notes (Signed)
PCP - [name], MD Cardiologist - Claudina Lick, MD  PPM/ICD - denies  Chest x-ray - 04/17/22 EKG - 04/17/22 Stress Test - 03/23/22 ECHO - 03/19/22 Cardiac Cath - denies  CPAP - n/a  Fasting Blood Sugar - n/a  Blood Thinner Instructions: n/a Patient was instructed: As of today, STOP taking any Aspirin (unless otherwise instructed by your surgeon) Aleve, Naproxen, Ibuprofen, Motrin, Advil, Goody's, BC's, all herbal medications, fish oil, and all vitamins.  ERAS Protcol - n/a  COVID TEST- yes, will be done DOS  Anesthesia review: yes - abnormal EKG on 04/17/22. Patient saw Dr. Dellia Cloud, Vishnu for chest pain and Lightheadedness   Patient verbally denies any shortness of breath, fever, cough and chest pain during phone call   -------------  SDW INSTRUCTIONS given:  Your procedure is scheduled on Thursday, February 1st, 2024.  Report to [facility] Main Entrance "A" at 05:30 A.M., and check in at the Admitting office.  Call this number if you have problems the morning of surgery:  [phone]   Remember:  Do not eat or drink after midnight the night before your surgery    Take these medicines the morning of surgery with A SIP OF WATER: Prozac, inhaler PRN: Tylenol, Xanax, Tessalon, inhaler Please bring all inhalers with you the day of surgery.      The day of surgery:                     Do not wear jewelry, make up, or nail polish            Do not wear lotions, powders, perfumes, or deodorant.            Do not shave 48 hours prior to surgery.              Do not bring valuables to the hospital.            [facility] is not responsible for any belongings or valuables.  Do NOT Smoke (Tobacco/Vaping) 24 hours prior to your procedure If you use a CPAP at night, you may bring all equipment for your overnight stay.   Contacts, glasses, dentures or bridgework may not be worn into surgery.      For patients admitted to the hospital, discharge time will be  determined by your treatment team.   Patients discharged the day of surgery will not be allowed to drive home, and someone needs to stay with them for 24 hours.    Special instructions:   [place]- Preparing For Surgery  Before surgery, you can play an important role. Because skin is not sterile, your skin needs to be as free of germs as possible. You can reduce the number of germs on your skin by washing with CHG (chlorahexidine gluconate) Soap before surgery.  CHG is an antiseptic cleaner which kills germs and bonds with the skin to continue killing germs even after washing.    Oral Hygiene is also important to reduce your risk of infection.  Remember - BRUSH YOUR TEETH THE MORNING OF SURGERY WITH YOUR REGULAR TOOTHPASTE  Please do not use if you have an allergy to CHG or antibacterial soaps. If your skin becomes reddened/irritated stop using the CHG.  Do not shave (including legs and underarms) for at least 48 hours prior to first CHG shower. It is OK to shave your face.  Please follow these instructions carefully.   Shower the NIGHT BEFORE SURGERY and the MORNING OF SURGERY  with DIAL Soap.   Pat yourself dry with a CLEAN TOWEL.  Wear CLEAN PAJAMAS to bed the night before surgery  Place CLEAN SHEETS on your bed the night of your first shower and DO NOT SLEEP WITH PETS.   Day of Surgery: Please shower morning of surgery  Wear Clean/Comfortable clothing the morning of surgery Do not apply any deodorants/lotions.   Remember to brush your teeth WITH YOUR REGULAR TOOTHPASTE.   Questions were answered. Patient verbalized understanding of instructions.

## 2022-05-12 NOTE — Progress Notes (Signed)
Anesthesia Chart Review: Same day workup  Patient recently evaluated bycardiology for presyncope as well as chest discomfort associated with shortness of breath.  Seen by Dr. Dellia Cloud on 03/15/2022.  Per note, her presyncope was felt likely secondary to orthostatic hypotension and her HCTZ was decreased from 50 mg to 25 mg once daily and her lisinopril was decreased from 40 mg to 20 mg once daily. Echocardiogram event monitor, and exercise Myoview ordered for evaluation of chest pain and shortness of breath.  Stress test was low risk.  Echo showed EF 65 to 70%, no significant valvular abnormalities.  Event monitor showed underlying rhythm to be sinus without evidence of pauses or malignant atrial/ventricular arrhythmias.  Current every day smoker with greater than 40-pack-year history.  Recent CT chest 04/26/2022 showed a 3.8 cm cavitary mass in the anterior right lower lobe abutting the major fissure concerning for malignancy.  Referred to pulmonology for consideration of bronchoscopy and biopsy.  Pt will need DOS labs and eval  EKG 04/17/2022: Normal sinus rhythm. ST & T wave abnormality, consider inferior ischemia. ST & T wave abnormality, consider anterolateral ischemia.  Does not appear significantly changed from tracing 03/15/2022.  CT chest 04/26/2022: IMPRESSION: 1. There is a 3.8 cm cavitary mass within the anterior right lower lobe, abutting the major fissure. Differential considerations include malignancy (such as squamous cell carcinoma) versus sequelae of inflammation or infection. In the absence of signs/symptoms of infection referral to pulmonary medicine or thoracic surgery is advised for further management. 2. 4 mm subpleural nodule in the medial right upper lobe is nonspecific. Attention on follow-up imaging is advised. 3. Subacute fracture deformities involving the anterior aspect of the right eighth and ninth ribs. 4. Coronary artery calcifications. 5. Aortic Atherosclerosis  (ICD10-I70.0) and Emphysema (ICD10-J43.9).  Event monitor 04/07/2022: Conclusion: Normal sinus rhythm with no evidence of pauses or malignant atrial/ventricular arrhythmias.   Nuclear stress 03/23/2022:   Findings are consistent with no ischemia. The study is low risk based on perfusion images and normal LVEF.   Equivocal 0.5 mm of horizontal ST depression (II, III, aVF, V5 and V6) was noted. The ECG was negative for ischemia.  Intermediate risk Duke treadmill score of 2.5.   LV perfusion is normal.  Mild breast attenuation noted at inferoapical septum.   Left ventricular function is normal. Nuclear stress EF: 86 %.   Overall low risk study based on perfusion images showing no definite ischemia and vigorous LVEF of 86%.  Equivocal ST segment changes noted with exercise as outlined.  TTE 03/19/2022:  1. Left ventricular ejection fraction, by estimation, is 65 to 70%. The  left ventricle has normal function. The left ventricle has no regional  wall motion abnormalities. Left ventricular diastolic parameters were  normal.   2. Right ventricular systolic function is normal. The right ventricular  size is normal. Tricuspid regurgitation signal is inadequate for assessing  PA pressure.   3. The mitral valve is normal in structure. No evidence of mitral valve  regurgitation. No evidence of mitral stenosis.   4. The aortic valve has an indeterminant number of cusps. There is mild  calcification of the aortic valve. There is mild thickening of the aortic  valve. Aortic valve regurgitation is not visualized. No aortic stenosis is  present.    Wynonia Musty [facility]/Anesthesiology Phone [phone] 05/12/2022 2:50 PM

## 2022-05-12 NOTE — Anesthesia Preprocedure Evaluation (Signed)
Anesthesia Evaluation  Patient identified by MRN, date of birth, ID band Patient awake    Reviewed: Allergy & Precautions, H&P , NPO status , Patient's Chart, lab work & pertinent test results  Airway Mallampati: II  TM Distance: >3 FB Neck ROM: Full    Dental  (+) Edentulous Upper, Edentulous Lower   Pulmonary Current Smoker and Patient abstained from smoking.   Pulmonary exam normal breath sounds clear to auscultation       Cardiovascular hypertension, Pt. on medications Normal cardiovascular exam Rhythm:Regular Rate:Normal     Neuro/Psych   Anxiety Depression    negative neurological ROS  negative psych ROS   GI/Hepatic negative GI ROS, Neg liver ROS,,,  Endo/Other  negative endocrine ROS    Renal/GU negative Renal ROS  negative genitourinary   Musculoskeletal  (+) Arthritis , Osteoarthritis,    Abdominal   Peds negative pediatric ROS (+)  Hematology negative hematology ROS (+)   Anesthesia Other Findings   Reproductive/Obstetrics negative OB ROS                             Anesthesia Physical Anesthesia Plan  ASA: 3  Anesthesia Plan: General   Post-op Pain Management: Dilaudid IV   Induction: Intravenous  PONV Risk Score and Plan: 2 and Ondansetron, Midazolam and Treatment may vary due to age or medical condition  Airway Management Planned: Oral ETT  Additional Equipment:   Intra-op Plan:   Post-operative Plan: Extubation in OR  Informed Consent: I have reviewed the patients History and Physical, chart, labs and discussed the procedure including the risks, benefits and alternatives for the proposed anesthesia with the patient or authorized representative who has indicated his/her understanding and acceptance.     Dental advisory given  Plan Discussed with: CRNA  Anesthesia Plan Comments: (PAT note by Karoline Caldwell, PA-C:  Patient recently evaluated bycardiology for  presyncope as well as chest discomfort associated with shortness of breath.  Seen by Dr. Dellia Cloud on 03/15/2022.  Per note, her presyncope was felt likely secondary to orthostatic hypotension and her HCTZ was decreased from 50 mg to 25 mg once daily and her lisinopril was decreased from 40 mg to 20 mg once daily. Echocardiogram event monitor, and exercise Myoview ordered for evaluation of chest pain and shortness of breath.  Stress test was low risk.  Echo showed EF 65 to 70%, no significant valvular abnormalities.  Event monitor showed underlying rhythm to be sinus without evidence of pauses or malignant atrial/ventricular arrhythmias.  Current every day smoker with greater than 40-pack-year history.  Recent CT chest 04/26/2022 showed a 3.8 cm cavitary mass in the anterior right lower lobe abutting the major fissure concerning for malignancy.  Referred to pulmonology for consideration of bronchoscopy and biopsy.  Pt will need DOS labs and eval  EKG 04/17/2022: Normal sinus rhythm. ST & T wave abnormality, consider inferior ischemia. ST & T wave abnormality, consider anterolateral ischemia.  Does not appear significantly changed from tracing 03/15/2022.  CT chest 04/26/2022: IMPRESSION: 1. There is a 3.8 cm cavitary mass within the anterior right lower lobe, abutting the major fissure. Differential considerations include malignancy (such as squamous cell carcinoma) versus sequelae of inflammation or infection. In the absence of signs/symptoms of infection referral to pulmonary medicine or thoracic surgery is advised for further management. 2. 4 mm subpleural nodule in the medial right upper lobe is nonspecific. Attention on follow-up imaging is advised. 3. Subacute fracture deformities  involving the anterior aspect of the right eighth and ninth ribs. 4. Coronary artery calcifications. 5. Aortic Atherosclerosis (ICD10-I70.0) and Emphysema (ICD10-J43.9).  Event monitor 04/07/2022: Conclusion:  Normal sinus rhythm with no evidence of pauses or malignant atrial/ventricular arrhythmias.   Nuclear stress 03/23/2022:  Findings are consistent with no ischemia. The study is low risk based on perfusion images and normal LVEF.  Equivocal 0.5 mm of horizontal ST depression (II, III, aVF, V5 and V6) was noted. The ECG was negative for ischemia. Intermediate risk Duke treadmill score of 2.5.  LV perfusion is normal. Mild breast attenuation noted at inferoapical septum.  Left ventricular function is normal. Nuclear stress EF: 86 %.  Overall low risk study based on perfusion images showing no definite ischemia and vigorous LVEF of 86%. Equivocal ST segment changes noted with exercise as outlined.  TTE 03/19/2022: 1. Left ventricular ejection fraction, by estimation, is 65 to 70%. The  left ventricle has normal function. The left ventricle has no regional  wall motion abnormalities. Left ventricular diastolic parameters were  normal.  2. Right ventricular systolic function is normal. The right ventricular  size is normal. Tricuspid regurgitation signal is inadequate for assessing  PA pressure.  3. The mitral valve is normal in structure. No evidence of mitral valve  regurgitation. No evidence of mitral stenosis.  4. The aortic valve has an indeterminant number of cusps. There is mild  calcification of the aortic valve. There is mild thickening of the aortic  valve. Aortic valve regurgitation is not visualized. No aortic stenosis is  present.   )        Anesthesia Quick Evaluation

## 2022-05-13 ENCOUNTER — Ambulatory Visit (HOSPITAL_COMMUNITY): Payer: PPO | Admitting: Physician Assistant

## 2022-05-13 ENCOUNTER — Encounter (HOSPITAL_COMMUNITY)
Admission: RE | Disposition: A | Discharge status: Home / Self Care | Payer: Self-pay | Source: Home / Self Care | Attending: Pulmonary Disease

## 2022-05-13 ENCOUNTER — Ambulatory Visit (HOSPITAL_BASED_OUTPATIENT_CLINIC_OR_DEPARTMENT_OTHER): Payer: PPO | Admitting: Physician Assistant

## 2022-05-13 ENCOUNTER — Ambulatory Visit (HOSPITAL_COMMUNITY): Payer: PPO

## 2022-05-13 ENCOUNTER — Encounter (HOSPITAL_COMMUNITY): Payer: Self-pay | Admitting: Pulmonary Disease

## 2022-05-13 ENCOUNTER — Ambulatory Visit (HOSPITAL_COMMUNITY)
Admission: RE | Admit: 2022-05-13 | Discharge: 2022-05-13 | Disposition: A | Discharge status: Home / Self Care | Payer: PPO | Attending: Pulmonary Disease | Admitting: Pulmonary Disease

## 2022-05-13 ENCOUNTER — Inpatient Hospital Stay (HOSPITAL_COMMUNITY): Admission: RE | Admit: 2022-05-13 | Payer: PPO | Source: Ambulatory Visit

## 2022-05-13 ENCOUNTER — Other Ambulatory Visit: Payer: Self-pay

## 2022-05-13 DIAGNOSIS — F418 Other specified anxiety disorders: Secondary | ICD-10-CM

## 2022-05-13 DIAGNOSIS — M1990 Unspecified osteoarthritis, unspecified site: Secondary | ICD-10-CM | POA: Insufficient documentation

## 2022-05-13 DIAGNOSIS — F17210 Nicotine dependence, cigarettes, uncomplicated: Secondary | ICD-10-CM | POA: Diagnosis not present

## 2022-05-13 DIAGNOSIS — J439 Emphysema, unspecified: Secondary | ICD-10-CM | POA: Insufficient documentation

## 2022-05-13 DIAGNOSIS — C3431 Malignant neoplasm of lower lobe, right bronchus or lung: Secondary | ICD-10-CM | POA: Diagnosis not present

## 2022-05-13 DIAGNOSIS — I700 Atherosclerosis of aorta: Secondary | ICD-10-CM | POA: Diagnosis not present

## 2022-05-13 DIAGNOSIS — Z79899 Other long term (current) drug therapy: Secondary | ICD-10-CM | POA: Diagnosis not present

## 2022-05-13 DIAGNOSIS — Z1152 Encounter for screening for COVID-19: Secondary | ICD-10-CM | POA: Insufficient documentation

## 2022-05-13 DIAGNOSIS — I2510 Atherosclerotic heart disease of native coronary artery without angina pectoris: Secondary | ICD-10-CM | POA: Diagnosis not present

## 2022-05-13 DIAGNOSIS — J984 Other disorders of lung: Secondary | ICD-10-CM | POA: Diagnosis not present

## 2022-05-13 DIAGNOSIS — Z8541 Personal history of malignant neoplasm of cervix uteri: Secondary | ICD-10-CM | POA: Insufficient documentation

## 2022-05-13 DIAGNOSIS — R918 Other nonspecific abnormal finding of lung field: Secondary | ICD-10-CM | POA: Diagnosis present

## 2022-05-13 DIAGNOSIS — I10 Essential (primary) hypertension: Secondary | ICD-10-CM

## 2022-05-13 DIAGNOSIS — Z923 Personal history of irradiation: Secondary | ICD-10-CM | POA: Diagnosis not present

## 2022-05-13 HISTORY — PX: VIDEO BRONCHOSCOPY WITH ENDOBRONCHIAL ULTRASOUND: SHX6177

## 2022-05-13 HISTORY — PX: BRONCHIAL BRUSHINGS: SHX5108

## 2022-05-13 HISTORY — DX: Dyspnea, unspecified: R06.00

## 2022-05-13 HISTORY — PX: BRONCHIAL NEEDLE ASPIRATION BIOPSY: SHX5106

## 2022-05-13 HISTORY — PX: BRONCHIAL BIOPSY: SHX5109

## 2022-05-13 HISTORY — PX: FINE NEEDLE ASPIRATION: SHX5430

## 2022-05-13 LAB — CBC
HCT: 35.4 % — ABNORMAL LOW (ref 36.0–46.0)
Hemoglobin: 11.2 g/dL — ABNORMAL LOW (ref 12.0–15.0)
MCH: 28.3 pg (ref 26.0–34.0)
MCHC: 31.6 g/dL (ref 30.0–36.0)
MCV: 89.4 fL (ref 80.0–100.0)
Platelets: 312 10*3/uL (ref 150–400)
RBC: 3.96 MIL/uL (ref 3.87–5.11)
RDW: 14.2 % (ref 11.5–15.5)
WBC: 9.9 10*3/uL (ref 4.0–10.5)
nRBC: 0 % (ref 0.0–0.2)

## 2022-05-13 LAB — SARS CORONAVIRUS 2 BY RT PCR: SARS Coronavirus 2 by RT PCR: NEGATIVE

## 2022-05-13 LAB — BASIC METABOLIC PANEL
Anion gap: 8 (ref 5–15)
BUN: 16 mg/dL (ref 8–23)
CO2: 25 mmol/L (ref 22–32)
Calcium: 8.8 mg/dL — ABNORMAL LOW (ref 8.9–10.3)
Chloride: 105 mmol/L (ref 98–111)
Creatinine, Ser: 1.07 mg/dL — ABNORMAL HIGH (ref 0.44–1.00)
GFR, Estimated: 57 mL/min — ABNORMAL LOW (ref >60)
Glucose, Bld: 105 mg/dL — ABNORMAL HIGH (ref 70–99)
Potassium: 4.5 mmol/L (ref 3.5–5.1)
Sodium: 138 mmol/L (ref 135–145)

## 2022-05-13 SURGERY — BRONCHOSCOPY, WITH EBUS
Anesthesia: General | Laterality: Right

## 2022-05-13 MED ORDER — AMISULPRIDE (ANTIEMETIC) 5 MG/2ML IV SOLN: 10.0000 mg | Freq: Once | INTRAVENOUS | Status: DC | PRN

## 2022-05-13 MED ORDER — PHENYLEPHRINE 80 MCG/ML (10ML) SYRINGE FOR IV PUSH (FOR BLOOD PRESSURE SUPPORT)
PREFILLED_SYRINGE | INTRAVENOUS | Status: DC | PRN
  Administered 2022-05-13: 160 ug via INTRAVENOUS

## 2022-05-13 MED ORDER — PROMETHAZINE HCL 25 MG/ML IJ SOLN: 6.2500 mg | INTRAMUSCULAR | Status: DC | PRN

## 2022-05-13 MED ORDER — LIDOCAINE 2% (20 MG/ML) 5 ML SYRINGE
INTRAMUSCULAR | Status: DC | PRN
  Administered 2022-05-13: 60 mg via INTRAVENOUS

## 2022-05-13 MED ORDER — SUGAMMADEX SODIUM 200 MG/2ML IV SOLN
INTRAVENOUS | Status: DC | PRN
  Administered 2022-05-13: 200 mg via INTRAVENOUS

## 2022-05-13 MED ORDER — MIDAZOLAM HCL 2 MG/2ML IJ SOLN
INTRAMUSCULAR | Status: DC | PRN
  Administered 2022-05-13: 2 mg via INTRAVENOUS

## 2022-05-13 MED ORDER — OXYCODONE HCL 5 MG PO TABS: 5.0000 mg | ORAL_TABLET | Freq: Once | ORAL | Status: DC | PRN

## 2022-05-13 MED ORDER — ROCURONIUM BROMIDE 10 MG/ML (PF) SYRINGE
PREFILLED_SYRINGE | INTRAVENOUS | Status: DC | PRN
  Administered 2022-05-13: 60 mg via INTRAVENOUS

## 2022-05-13 MED ORDER — MEPERIDINE HCL 25 MG/ML IJ SOLN: 6.2500 mg | INTRAMUSCULAR | Status: DC | PRN

## 2022-05-13 MED ORDER — CHLORHEXIDINE GLUCONATE 0.12 % MT SOLN
15.0000 mL | Freq: Once | OROMUCOSAL | Status: AC
  Administered 2022-05-13: 15 mL via OROMUCOSAL
  Filled 2022-05-13: qty 15

## 2022-05-13 MED ORDER — OXYCODONE HCL 5 MG/5ML PO SOLN: 5.0000 mg | Freq: Once | ORAL | Status: DC | PRN

## 2022-05-13 MED ORDER — PROPOFOL 500 MG/50ML IV EMUL
INTRAVENOUS | Status: DC | PRN
  Administered 2022-05-13: 150 ug/kg/min via INTRAVENOUS

## 2022-05-13 MED ORDER — DEXAMETHASONE SODIUM PHOSPHATE 10 MG/ML IJ SOLN
INTRAMUSCULAR | Status: DC | PRN
  Administered 2022-05-13: 10 mg via INTRAVENOUS

## 2022-05-13 MED ORDER — FENTANYL CITRATE (PF) 100 MCG/2ML IJ SOLN
INTRAMUSCULAR | Status: DC | PRN
  Administered 2022-05-13 (×2): 50 ug via INTRAVENOUS

## 2022-05-13 MED ORDER — ONDANSETRON HCL 4 MG/2ML IJ SOLN
INTRAMUSCULAR | Status: DC | PRN
  Administered 2022-05-13: 4 mg via INTRAVENOUS

## 2022-05-13 MED ORDER — LACTATED RINGERS IV SOLN: INTRAVENOUS | Status: DC

## 2022-05-13 MED ORDER — PROPOFOL 10 MG/ML IV BOLUS
INTRAVENOUS | Status: DC | PRN
  Administered 2022-05-13: 150 mg via INTRAVENOUS

## 2022-05-13 SURGICAL SUPPLY — 29 items

## 2022-05-13 NOTE — Anesthesia Postprocedure Evaluation (Signed)
Anesthesia Post Note  Patient: [name]  Procedure(s) Performed: ROBOTIC ASSISTED NAVIGATIONAL BRONCHOSCOPY (Right) VIDEO BRONCHOSCOPY WITH ENDOBRONCHIAL ULTRASOUND (Right) BRONCHIAL BIOPSIES BRONCHIAL NEEDLE ASPIRATION BIOPSIES BRONCHIAL BRUSHINGS FINE NEEDLE ASPIRATION (FNA) LINEAR     Patient location during evaluation: PACU Anesthesia Type: General Level of consciousness: awake and alert Pain management: pain level controlled Vital Signs Assessment: post-procedure vital signs reviewed and stable Respiratory status: spontaneous breathing, nonlabored ventilation and respiratory function stable Cardiovascular status: blood pressure returned to baseline and stable Postop Assessment: no apparent nausea or vomiting Anesthetic complications: no   No notable events documented.  Last Vitals:  Vitals:   05/13/22 0930 05/13/22 0935  BP: 128/88 (!) 164/83  Pulse: 73 70  Resp: (!) 21 (!) 21  Temp:  36.5 C  SpO2: 97% 95%    Last Pain:  Vitals:   05/13/22 0935  TempSrc:   PainSc: 0-No pain                 Lynda Rainwater

## 2022-05-13 NOTE — Interval H&P Note (Signed)
History and Physical Interval Note:  05/13/2022 7:30 AM  [name]  has presented today for surgery, with the diagnosis of lung mass.  The various methods of treatment have been discussed with the patient and family. After consideration of risks, benefits and other options for treatment, the patient has consented to  Procedure(s): ROBOTIC ASSISTED NAVIGATIONAL BRONCHOSCOPY (Right) VIDEO BRONCHOSCOPY WITH ENDOBRONCHIAL ULTRASOUND (Right) as a surgical intervention.  The patient's history has been reviewed, patient examined, no change in status, stable for surgery.  I have reviewed the patient's chart and labs.  Questions were answered to the patient's satisfaction.     [place]

## 2022-05-13 NOTE — Transfer of Care (Signed)
Immediate Anesthesia Transfer of Care Note  Patient: [name]  Procedure(s) Performed: ROBOTIC ASSISTED NAVIGATIONAL BRONCHOSCOPY (Right) VIDEO BRONCHOSCOPY WITH ENDOBRONCHIAL ULTRASOUND (Right) BRONCHIAL BIOPSIES BRONCHIAL NEEDLE ASPIRATION BIOPSIES BRONCHIAL BRUSHINGS FINE NEEDLE ASPIRATION (FNA) LINEAR  Patient Location: PACU  Anesthesia Type:General  Level of Consciousness: awake, alert , and oriented  Airway & Oxygen Therapy: Patient Spontanous Breathing and Patient connected to face mask oxygen  Post-op Assessment: Report given to RN and Post -op Vital signs reviewed and stable  Post vital signs: Reviewed and stable  Last Vitals:  Vitals Value Taken Time  BP 159/78   Temp    Pulse 68   Resp 14   SpO2 98%     Last Pain:  Vitals:   05/13/22 0621  TempSrc: Oral  PainSc:          Complications: No notable events documented.

## 2022-05-13 NOTE — Anesthesia Procedure Notes (Signed)
Procedure Name: Intubation Date/Time: 05/13/2022 7:52 AM  Performed by: Genelle Bal, CRNAPre-anesthesia Checklist: Patient identified, Emergency Drugs available, Suction available and Patient being monitored Patient Re-evaluated:Patient Re-evaluated prior to induction Oxygen Delivery Method: Circle system utilized Preoxygenation: Pre-oxygenation with 100% oxygen Induction Type: IV induction Ventilation: Mask ventilation without difficulty Laryngoscope Size: Miller and 2 Grade View: Grade I Tube type: Oral Tube size: 8.5 mm Number of attempts: 1 Airway Equipment and Method: Stylet Placement Confirmation: ETT inserted through vocal cords under direct vision, positive ETCO2 and breath sounds checked- equal and bilateral Secured at: 21 cm Tube secured with: Tape Dental Injury: Teeth and Oropharynx as per pre-operative assessment

## 2022-05-13 NOTE — Op Note (Signed)
Video Bronchoscopy with Robotic Assisted Bronchoscopic Navigation  Video Bronchoscopy with Endobronchial Ultrasound Procedure Note  Date of Operation: 05/13/2022   Pre-op Diagnosis: Right cavitary lesion   Post-op Diagnosis: Right cavitary lesion   Surgeon: Garner Nash, Do   Assistants: None   Anesthesia: General endotracheal anesthesia  Operation: Flexible video fiberoptic bronchoscopy with robotic assistance and biopsies.  Estimated Blood Loss: Minimal  Complications: None  Indications and History: [name] is a 67 y.o. female with history of right cavitary lesion. The risks, benefits, complications, treatment options and expected outcomes were discussed with the patient.  The possibilities of pneumothorax, pneumonia, reaction to medication, pulmonary aspiration, perforation of a viscus, bleeding, failure to diagnose a condition and creating a complication requiring transfusion or operation were discussed with the patient who freely signed the consent.    Description of Procedure: The patient was seen in the Preoperative Area, was examined and was deemed appropriate to proceed.  The patient was taken to [facility] endoscopy room 3, identified as Jason Nest and the procedure verified as Flexible Video Fiberoptic Bronchoscopy.  A Time Out was held and the above information confirmed.   Prior to the date of the procedure a high-resolution CT scan of the chest was performed. Utilizing ION software program a virtual tracheobronchial tree was generated to allow the creation of distinct navigation pathways to the patient's parenchymal abnormalities. After being taken to the operating room general anesthesia was initiated and the patient  was orally intubated. The video fiberoptic bronchoscope was introduced via the endotracheal tube and a general inspection was performed which showed normal right and left lung anatomy, aspiration of the bilateral mainstems was completed to remove any  remaining secretions. Robotic catheter inserted into patient's endotracheal tube.   Target #1 Right cavitary lesion: The distinct navigation pathways prepared prior to this procedure were then utilized to navigate to patient's lesion identified on CT scan. The robotic catheter was secured into place and the vision probe was withdrawn.  Lesion location was approximated using fluoroscopy and 3d cone beam ct imaging for peripheral targeting and ct guided needle placement. Under fluoroscopic guidance transbronchial needle brushings, transbronchial needle biopsies, and transbronchial forceps biopsies were performed to be sent for cytology and pathology.  Target #2 Station 11R and 7: The standard scope was then withdrawn and the endobronchial ultrasound was used to identify and characterize the peritracheal, hilar and bronchial lymph nodes. Inspection showed enlarged small adenopathy in the right hilum and subcarinal space. Using real-time ultrasound guidance Wang needle biopsies were take from Station 11R and 7 nodes and were sent for cytology. The patient tolerated the procedure well without apparent complications. There was no significant blood loss. The bronchoscope was withdrawn.   At the end of the procedure a general airway inspection was performed and there was no evidence of active bleeding. The bronchoscope was removed.  The patient tolerated the procedure well. There was no significant blood loss and there were no obvious complications. A post-procedural chest x-ray is pending.  Samples Target #1: 1. Transbronchial needle brushings from RLL 2. Transbronchial Wang needle biopsies from RLL 3. Transbronchial forceps biopsies from RLL  Samples Target #2: 1. Wang needle biopsies from 11R node 2. Wang needle biopsies from 7 node  Plans:  The patient will be discharged from the PACU to home when recovered from anesthesia and after chest x-ray is reviewed. We will review the cytology, pathology and  microbiology results with the patient when they become available. Outpatient followup  will be with Garner Nash, DO.  Garner Nash, DO [facility] 05/13/2022 8:48 AM

## 2022-05-13 NOTE — Discharge Instructions (Signed)

## 2022-05-17 LAB — CYTOLOGY - NON PAP

## 2022-05-18 ENCOUNTER — Encounter: Payer: Self-pay | Admitting: Acute Care

## 2022-05-18 ENCOUNTER — Telehealth: Payer: Self-pay | Admitting: Acute Care

## 2022-05-18 ENCOUNTER — Ambulatory Visit: Payer: PPO | Admitting: Acute Care

## 2022-05-18 VITALS — BP 140/90 | HR 89 | Temp 98.1°F | Ht 65.0 in | Wt 125.2 lb

## 2022-05-18 DIAGNOSIS — C3431 Malignant neoplasm of lower lobe, right bronchus or lung: Secondary | ICD-10-CM | POA: Diagnosis not present

## 2022-05-18 DIAGNOSIS — R079 Chest pain, unspecified: Secondary | ICD-10-CM | POA: Diagnosis not present

## 2022-05-18 DIAGNOSIS — F17210 Nicotine dependence, cigarettes, uncomplicated: Secondary | ICD-10-CM

## 2022-05-18 DIAGNOSIS — F17200 Nicotine dependence, unspecified, uncomplicated: Secondary | ICD-10-CM

## 2022-05-18 LAB — CYTOLOGY - NON PAP

## 2022-05-18 NOTE — Progress Notes (Unsigned)
History of Present Illness [name] is a 68 y.o. female with past medical history of depression, cervical cancer, radiation treatments, and prior abdominal surgeries. Patient is a current every day smoker with a greater than 40-pack-year history. Patient has a CT scan of the chest on 04/26/2022 patient has a 3.8 cm cavitary mass in the anterior right lower lobe abutting the major fissure concerning for malignancy. Patient was referred to Dr. Valeta Harms  for consideration of bronchoscopy and biopsy. She underwent biopsy 05/13/2021. She is here for cytology results and follow up.    05/18/2022 Pt. Presents for follow up after Flexible video fiberoptic bronchoscopy with robotic assistance and biopsies on 05/13/2022 . She states she has done well after the procedure. Scant hemoptysis that has self resolved, no sore throat. She has returned to her baseline post procedure. She has noted a small right cervical lymph node that she feels has been there since the procedure. I have asked her to call if this does not self resolve.   The 7 node and 11 R node are negative for malignant cells. This is good news, and I was able to share this with the patient today. Target 1, which was the lesion of concern results are still in process, so I was unable to provide any answers to the patient .I called Pathology department to inquire about the pending biopsy. This had been assigned to a pathologist. She ordered additional stains that arrived today, 05/18/2022. She is now out sick, so the case has been assigned to another pathologist. I called the patient and explained this to her. I told her I will continue to check for cytology results, and call her as soon as we have the results.  She has an MRI brain scheduled for 05/19/2022, and she is scheduled to see Dr. Delton Coombes 05/24/2022 at 11 am. She is aware of both appointments. .   Test Results: Cytology 05/13/2022 FINAL MICROSCOPIC DIAGNOSIS:  C. LYMPH NODE, 11R, FINE NEEDLE  ASPIRATION:  - No malignant cells identified  - Lymphoid tissue present   D. LYMPH NODE, 7, FINE NEEDLE ASPIRATION:  - No malignant cells identified  - Lymphoid tissue present    Target 1, Right Cavitary Lesion >> Pending review         Latest Ref Rng & Units 05/13/2022    6:30 AM 09/25/2018   11:39 AM 01/05/2017    1:38 PM  CBC  WBC 4.0 - 10.5 K/uL 9.9  9.2  14.5   Hemoglobin 12.0 - 15.0 g/dL 11.2  15.1  14.2   Hematocrit 36.0 - 46.0 % 35.4  45.7  41.5   Platelets 150 - 400 K/uL 312  265  307        Latest Ref Rng & Units 05/13/2022    6:30 AM 09/04/2021    9:40 AM 09/25/2018   11:39 AM  BMP  Glucose 70 - 99 mg/dL 105  112  87   BUN 8 - 23 mg/dL 16  21  13    Creatinine 0.44 - 1.00 mg/dL 1.07  1.41  0.89   Sodium 135 - 145 mmol/L 138  136  139   Potassium 3.5 - 5.1 mmol/L 4.5  4.0  3.9   Chloride 98 - 111 mmol/L 105  102  105   CO2 22 - 32 mmol/L 25  26  23    Calcium 8.9 - 10.3 mg/dL 8.8  9.3  9.2     BNP No results found for: "BNP"  ProBNP No results found for: "PROBNP"  PFT No results found for: "FEV1PRE", "FEV1POST", "FVCPRE", "FVCPOST", "TLC", "DLCOUNC", "PREFEV1FVCRT", "PSTFEV1FVCRT"  DG Chest Port 1 View  Result Date: 05/13/2022 CLINICAL DATA:  Post bronchoscopy with biopsy EXAM: PORTABLE CHEST 1 VIEW COMPARISON:  Portable exam 0904 hours compared to 04/17/2022 FINDINGS: Normal heart size, mediastinal contours, and pulmonary vascularity. Atherosclerotic calcification aorta. Emphysematous and bronchitic changes consistent with COPD. Streaky atelectasis LEFT base. Masslike opacity again identified at lower RIGHT lung unchanged. No acute infiltrate, pleural effusion or pneumothorax. Osseous demineralization. IMPRESSION: COPD changes with again identified mass at RIGHT lung base. Streaky subsegmental atelectasis LEFT lower lobe. No acute abnormalities post biopsy. Aortic Atherosclerosis (ICD10-I70.0) and Emphysema (ICD10-J43.9). Electronically Signed   By: Lavonia Dana  M.D.   On: 05/13/2022 09:14   DG C-ARM BRONCHOSCOPY  Result Date: 05/13/2022 C-ARM BRONCHOSCOPY: Fluoroscopy was utilized by the requesting physician.  No radiographic interpretation.   NM PET Image Initial (PI) Skull Base To Thigh  Result Date: 05/07/2022 CLINICAL DATA:  Initial treatment strategy for non-small cell lung cancer, initial staging. EXAM: NUCLEAR MEDICINE PET SKULL BASE TO THIGH TECHNIQUE: 6.71 mCi F-18 FDG was injected intravenously. Full-ring PET imaging was performed from the skull base to thigh after the radiotracer. CT data was obtained and used for attenuation correction and anatomic localization. Fasting blood glucose: 112 mg/dl COMPARISON:  Chest CT April 26, 2022 CT abdomen pelvis December 30, 2016 FINDINGS: Mediastinal blood pool activity: SUV max 1.9 Liver activity: SUV max NA NECK: No hypermetabolic cervical adenopathy. Incidental CT findings: None. CHEST: Cavitary hypermetabolic mass in the right lower lobe measures 3.8 x 2.3 cm on image 45/8 with a max SUV 17.8. Hypermetabolic multifocal nodularity and ground-glass in the left lower lobe with a max SUV of 2.8. Small left pleural effusion without significant abnormal FDG avidity. In the paramedian subpleural left lower lobe on image 45/8 there is a possible small loculation of gas measuring 19 x 9 mm on image 45/8 without significant peripheral FDG avidity. Mildly metabolic asymmetric right hilar nodal tissue with a max SUV of 4.2. Prominent precarinal lymph node measures 9 mm in short axis on image 33/8 with a max SUV of 2.2. Small hiatal hernia with focus of FDG avidity in the distal esophagus with a max SUV of 2.2. Incidental CT findings: Aortic atherosclerosis. Centrilobular and paraseptal emphysema. ABDOMEN/PELVIS: No abnormal hypermetabolic activity within the liver, pancreas, adrenal glands, or spleen. No hypermetabolic lymph nodes in the abdomen or pelvis. Incidental CT findings: Stable benign left adrenal adenoma on  image 99/3. Left upper pole renal cortical calcifications. Aortic atherosclerosis. SKELETON: Focal FDG avidity associated with subacute fracture deformities in the right eighth and ninth ribs with a max SUV of 3.8 and 4.0 respectively. Incidental CT findings: Anterior lumbosacral fusion hardware. Multilevel degenerative changes spine IMPRESSION: 1. Cavitary hypermetabolic right lower lobe pulmonary mass, consistent with primary bronchogenic neoplasm. 2. Hypermetabolic multifocal nodularity and ground-glass in the left lower lobe is new from prior and favored to reflect an infectious or inflammatory etiology. 3. Possible focal gas in the paramedian subpleural left lower lobe without significant peripheral FDG avidity may reflect artifact or a loculated pneumothorax. Suggest further evaluation with dedicated chest CT. 4. Mildly metabolic asymmetric right hilar nodal tissue with a max SUV of 4.2, suspicious for nodal disease involvement. 5. Prominent precarinal lymph node with a max SUV of 2.2, nonspecific and may be reactive or reflect nodal disease involvement. Consider further evaluation with direct tissue sampling. 6. Small hiatal  hernia with focus of FDG avidity in the distal esophagus, which may reflect reflux esophagitis. Consider further evaluation with direct visualization. 7. Focal FDG avidity associated with subacute fracture deformities in the right eighth and ninth ribs, favored posttraumatic. 8. Aortic Atherosclerosis (ICD10-I70.0) and Emphysema (ICD10-J43.9). Findings regarding the possible loculated pneumothorax versus artifact in the left lower lobe will be called to the ordering clinician or representative by the Radiologist Assistant, and communication documented in the PACS or [employer]. Electronically Signed   By: Dahlia Bailiff M.D.   On: 05/07/2022 11:22   CT CHEST WO CONTRAST  Result Date: 04/26/2022 CLINICAL DATA:  Cough.  Lung mass. EXAM: CT CHEST WITHOUT CONTRAST TECHNIQUE:  Multidetector CT imaging of the chest was performed following the standard protocol without IV contrast. RADIATION DOSE REDUCTION: This exam was performed according to the departmental dose-optimization program which includes automated exposure control, adjustment of the mA and/or kV according to patient size and/or use of iterative reconstruction technique. COMPARISON:  Chest radiograph 04/17/2022 and chest CT FINDINGS: Cardiovascular: Normal heart size. Aortic atherosclerosis and coronary artery calcifications. No pericardial effusion. Mediastinum/Nodes: Thyroid gland, trachea and esophagus are unremarkable. No enlarged axillary or mediastinal lymph nodes. Hilar lymph nodes are suboptimally evaluated due to lack of IV contrast. Lungs/Pleura: Mild centrilobular and paraseptal emphysema. No pleural fluid or airspace disease. -Subsegmental atelectasis versus scar noted within the anterior left lower lobe, image 114/4. -Within the anterior right lower lobe, abutting the major fissure, there is a cavitary mass measuring 3.8 by 2.4 by 2.9 cm, image 94/4. The wall thickness of this mass measures up to 6 mm. -subpleural nodule in the medial right upper lobe measures 4 mm, image 32/4. -Calcified granuloma identified within the right middle lobe. Upper Abdomen: No acute abnormality. Right adrenal gland adenoma measures 1.5 cm and -10 Hounsfield units, image 138/3. No follow-up imaging recommended. Musculoskeletal: No chest wall mass or suspicious bone lesions identified. Subacute fracture deformities are noted involving the anterior aspect of the right eighth and ninth ribs, image 135/4. IMPRESSION: 1. There is a 3.8 cm cavitary mass within the anterior right lower lobe, abutting the major fissure. Differential considerations include malignancy (such as squamous cell carcinoma) versus sequelae of inflammation or infection. In the absence of signs/symptoms of infection referral to pulmonary medicine or thoracic surgery is  advised for further management. 2. 4 mm subpleural nodule in the medial right upper lobe is nonspecific. Attention on follow-up imaging is advised. 3. Subacute fracture deformities involving the anterior aspect of the right eighth and ninth ribs. 4. Coronary artery calcifications. 5. Aortic Atherosclerosis (ICD10-I70.0) and Emphysema (ICD10-J43.9). These results will be called to the ordering clinician or representative by the Radiologist Assistant, and communication documented in the PACS or [employer]. Electronically Signed   By: Kerby Moors M.D.   On: 04/26/2022 09:38     Past medical hx Past Medical History:  Diagnosis Date   Anxiety    Arthritis    spinal stenosis   Back disorder    "crooked spine"   Bulging lumbar disc    Cervical cancer (HCC)    Complication of anesthesia    ? hypotension 10/2016 at [facility] surgery    Depression    Dyspnea    H/O degenerative disc disease    History of radiation therapy 02/22/17-03/22/17   vaginal cuff 30 Gy in 5 fractions   Hypertension    Hypothyroidism    patient taken off of hypothyroid med in 11/2016    Osteoporosis  Thyroid disease      Social History   Tobacco Use   Smoking status: Every Day    Packs/day: 1.00    Years: 44.00    Total pack years: 44.00    Types: Cigarettes   Smokeless tobacco: Never   Tobacco comments:    Smokes 7 packs of cigarettes a week. 05/05/2022 Tay  Vaping Use   Vaping Use: Never used  Substance Use Topics   Alcohol use: No    Comment: 01-06-2016 Per pt rarely, 02-05-2016 per pt no but 19yrs ago     Drug use: No    Comment: 02-05-2016 per pt no but about 40 yrs ago    [name] reports that she has been smoking cigarettes. She has a 44.00 pack-year smoking history. She has never used smokeless tobacco. She reports that she does not drink alcohol and does not use drugs.  Tobacco Cessation: Current every day smoker with a 44+ pack year smoking history.   Past surgical hx, Family hx,  Social hx all reviewed.  Current Outpatient Medications on File Prior to Visit  Medication Sig   acetaminophen (TYLENOL) 325 MG tablet Take 650 mg by mouth every 6 (six) hours as needed for headache.   albuterol (VENTOLIN HFA) 108 (90 Base) MCG/ACT inhaler INHALE 2 PUFFS INTO THE LUNGS EVERY 6 HOURS AS NEEDED FOR WHEEZING OR SHORTNESS OF BREATH.   alendronate (FOSAMAX) 70 MG tablet Take 70 mg by mouth once a week.   ALPRAZolam (XANAX) 1 MG tablet Take 1 mg by mouth at bedtime. May take additional 1 mg during the day if needed   atorvastatin (LIPITOR) 40 MG tablet Take 40 mg by mouth at bedtime.    benzonatate (TESSALON) 100 MG capsule Take 100 mg by mouth 3 (three) times daily as needed for cough.   FLUoxetine (PROZAC) 40 MG capsule Take 40 mg by mouth daily.   Fluticasone-Umeclidin-Vilant (TRELEGY ELLIPTA) 100-62.5-25 MCG/ACT AEPB Inhale 1 each into the lungs daily.   hydrochlorothiazide (HYDRODIURIL) 25 MG tablet Take 1 tablet (25 mg total) by mouth daily. (Patient taking differently: Take 25 mg by mouth daily as needed (High blood pressure).)   lisinopril (ZESTRIL) 10 MG tablet Take 10 mg by mouth daily as needed (High blood pressure).   traZODone (DESYREL) 100 MG tablet Take 100 mg by mouth at bedtime.   No current facility-administered medications on file prior to visit.     Allergies  Allergen Reactions   Zithromax [Azithromycin] Rash and Other (See Comments)    Blisters in mouth    Prednisone Nausea Only    Sick to stomach Can take in low dose    Review Of Systems:  Constitutional:   +  weight loss, night sweats,  Fevers, chills, fatigue, or  lassitude.  HEENT:   No headaches,  Difficulty swallowing,  Tooth/dental problems, or  Sore throat,                No sneezing, itching, ear ache, nasal congestion, post nasal drip, + swollen right cervical lymph node.   CV:  No chest pain,  Orthopnea, PND, swelling in lower extremities, anasarca, dizziness, palpitations, syncope.    GI  No heartburn, indigestion, abdominal pain, nausea, vomiting, diarrhea, change in bowel habits, loss of appetite, bloody stools.   Resp: No shortness of breath with exertion or at rest.  No excess mucus, no productive cough,  No non-productive cough,  No coughing up of blood.  No change in color of mucus.  No  wheezing.  No chest wall deformity  Skin: no rash or lesions.  GU: no dysuria, change in color of urine, no urgency or frequency.  No flank pain, no hematuria   MS:  No joint pain or swelling.  No decreased range of motion.  No back pain.  Psych:  No change in mood or affect. No depression or anxiety.  No memory loss.   Vital Signs BP (!) 140/90   Pulse 89   Temp 98.1 F (36.7 C) (Oral)   Ht 5\' 5"  (1.651 m)   Wt 125 lb 3.2 oz (56.8 kg)   SpO2 95%   BMI 20.83 kg/m    Physical Exam:  General- No distress,  A&Ox3, pleasant ENT: No sinus tenderness, TM clear, pale nasal mucosa, no oral exudate,no post nasal drip, no LAN, + enlarged right cervical lymph node Cardiac: S1, S2, regular rate and rhythm, no murmur Chest: No wheeze/ rales/ dullness; no accessory muscle use, no nasal flaring, no sternal retractions, diminished per bases Abd.: Soft Non-tender, ND, BS +, Body mass index is 20.83 kg/m.  Ext: No clubbing cyanosis, edema Neuro:  normal strength Skin: No rashes, warm and dry Psych: normal mood and behavior   Assessment/Plan  Current Every Day smoker with Right cavitary lesion concerning for bronchogenic cancer Right cavitary Lesion pending cytology result #7, #11 R lymph nodes  negative for malignant cells Plan The two lymph nodes that were biopsied are both non malignant cells.  This is good news. We are still waiting to get a result from the Right lower lobe mass.  I will call cytology today and call you if they have the results. ( Called  2/6 afternoon  >> see above)  Follow up will depend on what the cytology of the RLL mass shows. Follow up with Dr.  Jamey Reas 05/24/2022. Continue Trelegy 1 puff once daily Rinse mouth after use. Use albuterol as needed for shortness of breath or wheezing , no more than 4 times a day. We will re-schedule PFT's at [facility].  Work on quitting smoking . Call 1-800 QUIT NOW for free nicotime patches gum or mints.  Please contact office for sooner follow up if symptoms do not improve or worsen or seek emergency care      Magdalen Spatz, NP 05/18/2022  12:54 PM

## 2022-05-18 NOTE — Telephone Encounter (Signed)
I called cytology regarding the pending biopsy result. The original pathologist  ordered additional staining, which arrived at the cytology office today. However the original pathologist is sick, so the case has been assigned to another pathologist. I have told Briony I will check frequently and call her as soon as I know the results. She knows to go for the MRI and follow up with Dr. Worthy Keeler next week.

## 2022-05-18 NOTE — Patient Instructions (Addendum)
It is good to see you today. The two lymph nodes that were biopsied are both non malignant cells.  This is good news. We are still waiting to get a result from the Right lower lobe mass. >> See addendum 2/7>> + squamous cell RLL per cytology I will call cytology today and call you if they have the results.  Follow up will depend on what the cytology of the RLL mass shows. Follow up with Dr. Jamey Reas 05/24/2022. Continue Trelegy 1 puff once daily Rinse mouth after use. Use albuterol as needed for shortness of breath or wheezing , no more than 4 times a day. We will re-schedule PFT's at [facility].  Work on quitting smoking . Call 1-800 QUIT NOW for free nicotime patches gum or mints.  Please contact office for sooner follow up if symptoms do not improve or worsen or seek emergency care

## 2022-05-19 ENCOUNTER — Encounter: Payer: Self-pay | Admitting: Acute Care

## 2022-05-19 ENCOUNTER — Other Ambulatory Visit: Payer: Self-pay

## 2022-05-19 ENCOUNTER — Ambulatory Visit (HOSPITAL_COMMUNITY)
Admission: RE | Admit: 2022-05-19 | Discharge: 2022-05-19 | Disposition: A | Discharge status: Home / Self Care | Payer: PPO | Source: Ambulatory Visit | Attending: Hematology | Admitting: Hematology

## 2022-05-19 DIAGNOSIS — C3490 Malignant neoplasm of unspecified part of unspecified bronchus or lung: Secondary | ICD-10-CM | POA: Insufficient documentation

## 2022-05-19 DIAGNOSIS — R0600 Dyspnea, unspecified: Secondary | ICD-10-CM

## 2022-05-19 MED ORDER — GADOBUTROL 1 MMOL/ML IV SOLN
6.0000 mL | Freq: Once | INTRAVENOUS | Status: AC | PRN
  Administered 2022-05-19: 6 mL via INTRAVENOUS

## 2022-05-23 DIAGNOSIS — C3431 Malignant neoplasm of lower lobe, right bronchus or lung: Secondary | ICD-10-CM | POA: Insufficient documentation

## 2022-05-24 ENCOUNTER — Inpatient Hospital Stay: Payer: PPO | Attending: Hematology | Admitting: Hematology

## 2022-05-24 VITALS — BP 162/88 | HR 84 | Temp 98.7°F | Resp 18 | Ht 65.0 in | Wt 125.2 lb

## 2022-05-24 DIAGNOSIS — F17210 Nicotine dependence, cigarettes, uncomplicated: Secondary | ICD-10-CM | POA: Diagnosis not present

## 2022-05-24 DIAGNOSIS — C3431 Malignant neoplasm of lower lobe, right bronchus or lung: Secondary | ICD-10-CM

## 2022-05-24 DIAGNOSIS — Z8541 Personal history of malignant neoplasm of cervix uteri: Secondary | ICD-10-CM | POA: Insufficient documentation

## 2022-05-24 DIAGNOSIS — Z803 Family history of malignant neoplasm of breast: Secondary | ICD-10-CM | POA: Diagnosis not present

## 2022-05-24 DIAGNOSIS — Z923 Personal history of irradiation: Secondary | ICD-10-CM | POA: Diagnosis not present

## 2022-05-24 NOTE — Progress Notes (Signed)
[facility] [address], [place] [address]    Clinic Day:  05/24/2022  Referring physician: [name], MD  Patient Care Team: [name], MD as PCP - General (Internal Medicine) Melina Schools, MD as Consulting Physician (Orthopedic Surgery) Rourk, Cristopher Estimable, MD as Consulting Physician (Gastroenterology) Derek Jack, MD as Medical Oncologist (Medical Oncology) Brien Mates, RN as Oncology Nurse Navigator (Medical Oncology)   ASSESSMENT & PLAN:   Assessment:  1.  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 - 18 pound weight loss in the last 3 months due to decreased appetite - Chest x-ray on 04/17/2022: Mass in the right lower lobe. - CT chest (04/26/2022): 3.8 cm cavitary mass in the anterior right lower lobe abutting the major fissure.  4 mm subpleural nodule in the medial right upper lobe is nonspecific.  No adenopathy on noncontrast exam. - PET scan (05/06/2022): 3.8 x 2.3 cm hypermetabolic right lower lobe mass SUV 17.8.  Mildly metabolic asymmetric right hilar nodal tissue with SUV 4.2.  Prominent precarinal lymph node 9 mm with SUV 2.2. - Brain MRI (05/19/2022): No evidence of intracranial metastatic disease. - Bronchoscopy and biopsy by Dr. Valeta Harms. - Pathology: Lymph node 11 R, 7 with no malignant cells.  Right lower lobe cavitary mass FNA consistent with squamous cell carcinoma, keratinizing.   2.  Social/family history: - Lives at home with her husband [name]. - She has been on disability since she was diagnosed with cervical cancer.  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  No chemical exposure. - She is independent of ADLs and IADLs. - Current active smoker and started smoking 1 pack/day at age 89. - Maternal aunt had breast cancer.   3.  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 - XRT 30 Gray in 5 fractions to the vaginal cuff from 02/22/2017 through 03/22/2017.   Plan: 1.  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 - Reviewed MRI brain results which were negative. - Reviewed biopsy results of the right lower lobe FNA consistent with squamous cell carcinoma, keratinizing. - Biopsy of the lymph node 11 R and lymph node station 7 are negative with lymphoid tissue present. - Recommend full PFTs. - Recommend evaluation by Dr. Roxan Hockey for surgical resection.  I have contacted Dr. Roxan Hockey about this patient.  I will see her back 4 weeks after surgery.    Orders Placed This Encounter  Procedures   Pulmonary Function Test    Standing Status:   Future    Standing Expiration Date:   05/25/2023    Order Specific Question:   Where should this test be performed?    Answer:   [facility]    Order Specific Question:   Full PFT: includes the following: basic spirometry, spirometry pre & post bronchodilator, diffusion capacity (DLCO), lung volumes    Answer:   Full PFT      Beverly Gust Oliver,acting as a scribe for Derek Jack, MD.,have documented all relevant documentation on the behalf of Derek Jack, MD,as directed by  Derek Jack, MD while in the presence of Derek Jack, MD.   I, Derek Jack MD, have reviewed the above documentation for accuracy and completeness, and I agree with the above.  Doyce Loose   2/12/202412:12 PM  CHIEF COMPLAINT:   Diagnosis: Right lower lobe cavitary lung mass   Cancer Staging  Primary squamous cell carcinoma of lower lobe of right lung [facility]) Staging form: Lung, AJCC 8th Edition - Clinical stage from 05/23/2022: Stage IB (cT2a, cN0, cM0) - Unsigned    Prior Therapy: None  Current Therapy:  None   HISTORY OF PRESENT ILLNESS:   Oncology History   No history exists.      INTERVAL HISTORY:   Savayah is a 67 y.o. female presenting to clinic today for follow up of Right lower lobe cavitary lung mass. She was last seen by me on 05/04/22 for consult.  Since our last visit patient had a bronchoscopy and biopsy on 05/13/22 by Dr. June Leap. She followed up with NP Eric Form, pulmonology, on 05/18/22 to discuss pathology results revealed RLL squamous cell carcinoma.   Today, she states that she is doing well overall. Her appetite level is at 40%. Her energy level is at 25%. She states that she broke some right sided ribs 6-8 weeks ago. She reports 4/10 right chest wall pain. She is scheduled on 07/06/22 for PFTs.   PAST MEDICAL HISTORY:   Past Medical History: Past Medical History:  Diagnosis Date   Anxiety    Arthritis    spinal stenosis   Back disorder    "crooked spine"   Bulging lumbar disc    Cervical cancer ([place])    Complication of anesthesia    ? hypotension 10/2016 at [facility] surgery    Depression    Dyspnea    H/O degenerative disc disease    History of radiation therapy 02/22/17-03/22/17   vaginal cuff 30 Gy in 5 fractions   Hypertension    Hypothyroidism    patient taken off of hypothyroid med in 11/2016    Osteoporosis    Thyroid disease     Surgical History: Past Surgical History:  Procedure Laterality Date   ABDOMINAL EXPOSURE N/A 09/27/2018   Procedure: ABDOMINAL EXPOSURE;  Surgeon: Serafina Mitchell, MD;  Location: [place];  Service: Vascular;  Laterality: N/A;   ABDOMINAL HYSTERECTOMY     ANTERIOR AND POSTERIOR REPAIR N/A 11/09/2016   Procedure: ANTERIOR (CYSTOCELE) AND POSTERIOR REPAIR (RECTOCELE);  Surgeon: Jonnie Kind, MD;  Location: [facility];  Service: Gynecology;  Laterality: N/A;   ANTERIOR LUMBAR FUSION Left 09/27/2018   Procedure: ANTERIOR LUMBAR FUSION L5-S1,;  Surgeon: Melina Schools, MD;  Location: [place];  Service: Orthopedics;  Laterality: Left;  4.5 HRS/ DR. Trula Slade ASSIST   BACK SURGERY     BILATERAL  SALPINGECTOMY Left 11/09/2016   Procedure: LEFT SALPINGECTOMY;  Surgeon: Jonnie Kind, MD;  Location: [facility];  Service: Gynecology;  Laterality: Left;   BRONCHIAL BIOPSY  05/13/2022   Procedure: BRONCHIAL BIOPSIES;  Surgeon: Garner Nash, DO;  Location: [facility];  Service: Pulmonary;;   BRONCHIAL BRUSHINGS  05/13/2022   Procedure: BRONCHIAL BRUSHINGS;  Surgeon: Garner Nash, DO;  Location: [facility];  Service: Pulmonary;;   BRONCHIAL NEEDLE ASPIRATION BIOPSY  05/13/2022   Procedure: BRONCHIAL NEEDLE ASPIRATION BIOPSIES;  Surgeon: Garner Nash, DO;  Location: [facility];  Service: Pulmonary;;   CATARACT EXTRACTION W/PHACO Right 07/05/2016   Procedure: CATARACT EXTRACTION PHACO AND INTRAOCULAR LENS PLACEMENT (IOC);  Surgeon: Tonny Branch, MD;  Location: [facility];  Service: Ophthalmology;  Laterality: Right;  CDE: 15.41   CATARACT EXTRACTION W/PHACO Left 07/19/2016   Procedure: CATARACT EXTRACTION PHACO AND  INTRAOCULAR LENS PLACEMENT LEFT EYE CDE= 12.65;  Surgeon: Tonny Branch, MD;  Location: [facility];  Service: Ophthalmology;  Laterality: Left;  left   COLONOSCOPY WITH PROPOFOL N/A 09/09/2021   Procedure: COLONOSCOPY WITH PROPOFOL;  Surgeon: Daneil Dolin, MD;  [facility];  Service: Endoscopy;  Laterality: N/A;  10:30am   EYE SURGERY     FINE NEEDLE ASPIRATION  05/13/2022   Procedure: FINE NEEDLE ASPIRATION (FNA) LINEAR;  Surgeon: Garner Nash, DO;  Location: [place];  Service: Pulmonary;;   LUMBAR LAMINECTOMY/DECOMPRESSION MICRODISCECTOMY Left 09/27/2018   Procedure: L4-L5 Lumbar Laminectomy/Decompression;  Surgeon: Melina Schools, MD;  Location: [place];  Service: Orthopedics;  Laterality: Left;   POLYPECTOMY  09/09/2021   Procedure: POLYPECTOMY;  Surgeon: Daneil Dolin, MD;  [facility];  Service: Endoscopy;;   ROBOTIC PELVIC AND PARA-AORTIC LYMPH NODE DISSECTION N/A 01/11/2017   Procedure: XI ROBOTIC BILATERAL PELVIC LYMPH NODE DISSECTION;   Surgeon: [name], MD;  Location: [facility];  Service: Gynecology;  Laterality: N/A;   SALPINGOOPHORECTOMY Right 11/09/2016   Procedure: RIGHT SALPINGO OOPHORECTOMY;  Surgeon: Jonnie Kind, MD;  Location: [facility];  Service: Gynecology;  Laterality: Right;   TUBAL LIGATION     VAGINAL HYSTERECTOMY N/A 11/09/2016   Procedure: HYSTERECTOMY VAGINAL;  Surgeon: Jonnie Kind, MD;  Location: [facility];  Service: Gynecology;  Laterality: N/A;   VIDEO BRONCHOSCOPY WITH ENDOBRONCHIAL ULTRASOUND Right 05/13/2022   Procedure: VIDEO BRONCHOSCOPY WITH ENDOBRONCHIAL ULTRASOUND;  Surgeon: Garner Nash, DO;  Location: [place];  Service: Pulmonary;  Laterality: Right;    Social History: Social History   Socioeconomic History   Marital status: Legally Separated    Spouse name: Not on file   Number of children: 1   Years of education: Not on file   Highest education level: Not on file  Occupational History   Not on file  Tobacco Use   Smoking status: Every Day    Packs/day: 1.00    Years: 44.00    Total pack years: 44.00    Types: Cigarettes   Smokeless tobacco: Never   Tobacco comments:    Smokes 7 packs of cigarettes a week. 05/05/2022 [facility]  Vaping Use   Vaping Use: Never used  Substance and Sexual Activity   Alcohol use: No    Comment: 01-06-2016 Per pt rarely, 02-05-2016 per pt no but 92yrs ago     Drug use: No    Comment: 02-05-2016 per pt no but about 40 yrs ago   Sexual activity: Not Currently    Birth control/protection: Surgical    Comment: hyst  Other Topics Concern   Not on file  Social History Narrative   Not on file   Social Determinants of Health   Financial Resource Strain: Unknown (01/20/2021)   Overall Financial Resource Strain (CARDIA)    Difficulty of Paying Living Expenses: Patient refused  Food Insecurity: No Food Insecurity (05/04/2022)   Hunger Vital Sign    Worried About Running Out of Food in the Last Year: Never true    Ran Out of Food in the Last  Year: Never true  Transportation Needs: No Transportation Needs (05/04/2022)   PRAPARE - Hydrologist (Medical): No    Lack of Transportation (Non-Medical): No  Physical Activity: Inactive (01/20/2021)   Exercise Vital Sign    Days of Exercise per Week: 1 day    Minutes of Exercise per Session: 0 min  Stress: Stress Concern  Present (01/20/2021)   [place]    Feeling of Stress : Very much  Social Connections: Moderately Isolated (01/20/2021)   Social Connection and Isolation Panel [NHANES]    Frequency of Communication with Friends and Family: Twice a week    Frequency of Social Gatherings with Friends and Family: Never    Attends Religious Services: More than 4 times per year    Active Member of [employer] or Organizations: No    Attends Archivist Meetings: Never    Marital Status: Married  Human resources officer Violence: Not At Risk (05/04/2022)   Humiliation, Afraid, Rape, and Kick questionnaire    Fear of Current or Ex-Partner: No    Emotionally Abused: No    Physically Abused: No    Sexually Abused: No    Family History: Family History  Problem Relation Age of Onset   Hypertension Mother    Congenital heart disease Mother    Diabetes Mother    Thyroid disease Brother    Other Daughter        bowel issues   Colon cancer Neg Hx    Colon polyps Neg Hx     Current Medications:  Current Outpatient Medications:    acetaminophen (TYLENOL) 325 MG tablet, Take 650 mg by mouth every 6 (six) hours as needed for headache., Disp: , Rfl:    albuterol (VENTOLIN HFA) 108 (90 Base) MCG/ACT inhaler, INHALE 2 PUFFS INTO THE LUNGS EVERY 6 HOURS AS NEEDED FOR WHEEZING OR SHORTNESS OF BREATH., Disp: 8.5 g, Rfl: 2   alendronate (FOSAMAX) 70 MG tablet, Take 70 mg by mouth once a week., Disp: , Rfl:    ALPRAZolam (XANAX) 1 MG tablet, Take 1 mg by mouth at bedtime. May take additional 1 mg during  the day if needed, Disp: , Rfl:    atorvastatin (LIPITOR) 40 MG tablet, Take 40 mg by mouth at bedtime. , Disp: , Rfl:    benzonatate (TESSALON) 100 MG capsule, Take 100 mg by mouth 3 (three) times daily as needed for cough., Disp: , Rfl:    FLUoxetine (PROZAC) 40 MG capsule, Take 40 mg by mouth daily., Disp: , Rfl:    Fluticasone-Umeclidin-Vilant (TRELEGY ELLIPTA) 100-62.5-25 MCG/ACT AEPB, Inhale 1 each into the lungs daily., Disp: 60 each, Rfl: 2   hydrochlorothiazide (HYDRODIURIL) 25 MG tablet, Take 1 tablet (25 mg total) by mouth daily. (Patient taking differently: Take 25 mg by mouth daily as needed (High blood pressure).), Disp: 90 tablet, Rfl: 3   lisinopril (ZESTRIL) 10 MG tablet, Take 10 mg by mouth daily as needed (High blood pressure)., Disp: , Rfl:    traZODone (DESYREL) 100 MG tablet, Take 100 mg by mouth at bedtime., Disp: , Rfl:    Allergies: Allergies  Allergen Reactions   Zithromax [Azithromycin] Rash and Other (See Comments)    Blisters in mouth    Prednisone Nausea Only    Sick to stomach Can take in low dose    REVIEW OF SYSTEMS:   Review of Systems  Constitutional:  Negative for chills, fatigue and fever.  HENT:   Positive for trouble swallowing. Negative for lump/mass, mouth sores, nosebleeds and sore throat.   Eyes:  Negative for eye problems.  Respiratory:  Positive for cough. Negative for shortness of breath.   Cardiovascular:  Positive for chest pain (right chest wall). Negative for leg swelling and palpitations.  Gastrointestinal:  Negative for abdominal pain, constipation, diarrhea, nausea and vomiting.  Genitourinary:  Negative for bladder incontinence, difficulty urinating, dysuria, frequency, hematuria and nocturia.   Musculoskeletal:  Negative for arthralgias, back pain, flank pain, myalgias and neck pain.  Skin:  Negative for itching and rash.  Neurological:  Positive for dizziness. Negative for headaches and numbness.  Hematological:  Does not  bruise/bleed easily.  Psychiatric/Behavioral:  Positive for depression. Negative for sleep disturbance and suicidal ideas. The patient is not nervous/anxious.   All other systems reviewed and are negative.    VITALS:   Blood pressure (!) 162/88, pulse 84, temperature 98.7 F (37.1 C), temperature source Oral, resp. rate 18, height 5\' 5"  (1.651 m), weight 125 lb 3.2 oz (56.8 kg), SpO2 96 %.  Wt Readings from Last 3 Encounters:  05/24/22 125 lb 3.2 oz (56.8 kg)  05/18/22 125 lb 3.2 oz (56.8 kg)  05/13/22 125 lb (56.7 kg)    Body mass index is 20.83 kg/m.  Performance status (ECOG): 1 - Symptomatic but completely ambulatory  PHYSICAL EXAM:   Physical Exam Vitals and nursing note reviewed. Exam conducted with a chaperone present.  Constitutional:      Appearance: Normal appearance.  Cardiovascular:     Rate and Rhythm: Normal rate and regular rhythm.     Pulses: Normal pulses.     Heart sounds: Normal heart sounds.  Pulmonary:     Effort: Pulmonary effort is normal.     Breath sounds: Normal breath sounds.  Abdominal:     Palpations: Abdomen is soft. There is no hepatomegaly, splenomegaly or mass.     Tenderness: There is no abdominal tenderness.  Musculoskeletal:     Right lower leg: No edema.     Left lower leg: No edema.  Lymphadenopathy:     Cervical: No cervical adenopathy.     Right cervical: No superficial, deep or posterior cervical adenopathy.    Left cervical: No superficial, deep or posterior cervical adenopathy.     Upper Body:     Right upper body: No supraclavicular or axillary adenopathy.     Left upper body: No supraclavicular or axillary adenopathy.  Neurological:     General: No focal deficit present.     Mental Status: She is alert and oriented to person, place, and time.  Psychiatric:        Mood and Affect: Mood normal.        Behavior: Behavior normal.     LABS:      Latest Ref Rng & Units 05/13/2022    6:30 AM 09/25/2018   11:39 AM 01/05/2017     1:38 PM  CBC  WBC 4.0 - 10.5 K/uL 9.9  9.2  14.5   Hemoglobin 12.0 - 15.0 g/dL 11.2  15.1  14.2   Hematocrit 36.0 - 46.0 % 35.4  45.7  41.5   Platelets 150 - 400 K/uL 312  265  307       Latest Ref Rng & Units 05/13/2022    6:30 AM 09/04/2021    9:40 AM 09/25/2018   11:39 AM  CMP  Glucose 70 - 99 mg/dL 105  112  87   BUN 8 - 23 mg/dL 16  21  13    Creatinine 0.44 - 1.00 mg/dL 1.07  1.41  0.89   Sodium 135 - 145 mmol/L 138  136  139   Potassium 3.5 - 5.1 mmol/L 4.5  4.0  3.9   Chloride 98 - 111 mmol/L 105  102  105   CO2 22 - 32 mmol/L 25  26  23   Calcium 8.9 - 10.3 mg/dL 8.8  9.3  9.2      No results found for: "CEA1", "CEA" / No results found for: "CEA1", "CEA" No results found for: "PSA1" No results found for: "IRC789" No results found for: "CAN125"  No results found for: "TOTALPROTELP", "ALBUMINELP", "A1GS", "A2GS", "BETS", "BETA2SER", "GAMS", "MSPIKE", "SPEI" No results found for: "TIBC", "FERRITIN", "IRONPCTSAT" No results found for: "LDH"   Cytology 05/13/2022 FINAL MICROSCOPIC DIAGNOSIS:  C. LYMPH NODE, 11R, FINE NEEDLE ASPIRATION:  - No malignant cells identified  - Lymphoid tissue present   D. LYMPH NODE, 7, FINE NEEDLE ASPIRATION:  - No malignant cells identified  - Lymphoid tissue present     Target 1, Right Cavitary Lesion >> Pending review  Addendum 05/19/2022 A.  RIGHT LUNG, LOWER LOBE, CAVITARY MASS, FINE NEEDLE ASPIRATION:  - Malignant  - Squamous cell carcinoma, keratinizing   B. RIGHT LUNG, LOWER LOBE, CAVITARY MASS, BRUSHING:  - Malignant  - Squamous cell carcinoma, keratinizing   STUDIES:   MR Brain W Wo Contrast  Result Date: 05/20/2022 CLINICAL DATA:  Non-small cell lung cancer (NSCLC), staging. EXAM: MRI HEAD WITHOUT AND WITH CONTRAST TECHNIQUE: Multiplanar, multiecho pulse sequences of the brain and surrounding structures were obtained without and with intravenous contrast. CONTRAST:  65mL GADAVIST GADOBUTROL 1 MMOL/ML IV SOLN COMPARISON:   None Available. FINDINGS: Brain: No acute infarction, hemorrhage, hydrocephalus, extra-axial collection or mass lesion. Scattered foci of T2 hyperintensity are seen within the white matter cerebral hemispheres, nonspecific, most likely related to chronic small vessel ischemia. Mild parenchymal volume loss. No focus of abnormal contrast enhancement identified. Vascular: Normal flow voids. Skull and upper cervical spine: Normal marrow signal. Sinuses/Orbits: Bilateral mastoid effusion, greater on the left. Small mucous retention cysts in the right maxillary and sphenoid sinuses. Bilateral lens surgery. Other: None. IMPRESSION: 1. No evidence of intracranial metastatic disease. 2. Mild chronic microvascular ischemic changes of the white matter. Electronically Signed   By: Pedro Earls M.D.   On: 05/20/2022 17:03   DG Chest Port 1 View  Result Date: 05/13/2022 CLINICAL DATA:  Post bronchoscopy with biopsy EXAM: PORTABLE CHEST 1 VIEW COMPARISON:  Portable exam 0904 hours compared to 04/17/2022 FINDINGS: Normal heart size, mediastinal contours, and pulmonary vascularity. Atherosclerotic calcification aorta. Emphysematous and bronchitic changes consistent with COPD. Streaky atelectasis LEFT base. Masslike opacity again identified at lower RIGHT lung unchanged. No acute infiltrate, pleural effusion or pneumothorax. Osseous demineralization. IMPRESSION: COPD changes with again identified mass at RIGHT lung base. Streaky subsegmental atelectasis LEFT lower lobe. No acute abnormalities post biopsy. Aortic Atherosclerosis (ICD10-I70.0) and Emphysema (ICD10-J43.9). Electronically Signed   By: Lavonia Dana M.D.   On: 05/13/2022 09:14   DG C-ARM BRONCHOSCOPY  Result Date: 05/13/2022 C-ARM BRONCHOSCOPY: Fluoroscopy was utilized by the requesting physician.  No radiographic interpretation.   NM PET Image Initial (PI) Skull Base To Thigh  Result Date: 05/07/2022 CLINICAL DATA:  Initial treatment strategy for  non-small cell lung cancer, initial staging. EXAM: NUCLEAR MEDICINE PET SKULL BASE TO THIGH TECHNIQUE: 6.71 mCi F-18 FDG was injected intravenously. Full-ring PET imaging was performed from the skull base to thigh after the radiotracer. CT data was obtained and used for attenuation correction and anatomic localization. Fasting blood glucose: 112 mg/dl COMPARISON:  Chest CT April 26, 2022 CT abdomen pelvis December 30, 2016 FINDINGS: Mediastinal blood pool activity: SUV max 1.9 Liver activity: SUV max NA NECK: No hypermetabolic cervical adenopathy. Incidental CT findings: None. CHEST: Cavitary hypermetabolic  mass in the right lower lobe measures 3.8 x 2.3 cm on image 45/8 with a max SUV 17.8. Hypermetabolic multifocal nodularity and ground-glass in the left lower lobe with a max SUV of 2.8. Small left pleural effusion without significant abnormal FDG avidity. In the paramedian subpleural left lower lobe on image 45/8 there is a possible small loculation of gas measuring 19 x 9 mm on image 45/8 without significant peripheral FDG avidity. Mildly metabolic asymmetric right hilar nodal tissue with a max SUV of 4.2. Prominent precarinal lymph node measures 9 mm in short axis on image 33/8 with a max SUV of 2.2. Small hiatal hernia with focus of FDG avidity in the distal esophagus with a max SUV of 2.2. Incidental CT findings: Aortic atherosclerosis. Centrilobular and paraseptal emphysema. ABDOMEN/PELVIS: No abnormal hypermetabolic activity within the liver, pancreas, adrenal glands, or spleen. No hypermetabolic lymph nodes in the abdomen or pelvis. Incidental CT findings: Stable benign left adrenal adenoma on image 99/3. Left upper pole renal cortical calcifications. Aortic atherosclerosis. SKELETON: Focal FDG avidity associated with subacute fracture deformities in the right eighth and ninth ribs with a max SUV of 3.8 and 4.0 respectively. Incidental CT findings: Anterior lumbosacral fusion hardware. Multilevel  degenerative changes spine IMPRESSION: 1. Cavitary hypermetabolic right lower lobe pulmonary mass, consistent with primary bronchogenic neoplasm. 2. Hypermetabolic multifocal nodularity and ground-glass in the left lower lobe is new from prior and favored to reflect an infectious or inflammatory etiology. 3. Possible focal gas in the paramedian subpleural left lower lobe without significant peripheral FDG avidity may reflect artifact or a loculated pneumothorax. Suggest further evaluation with dedicated chest CT. 4. Mildly metabolic asymmetric right hilar nodal tissue with a max SUV of 4.2, suspicious for nodal disease involvement. 5. Prominent precarinal lymph node with a max SUV of 2.2, nonspecific and may be reactive or reflect nodal disease involvement. Consider further evaluation with direct tissue sampling. 6. Small hiatal hernia with focus of FDG avidity in the distal esophagus, which may reflect reflux esophagitis. Consider further evaluation with direct visualization. 7. Focal FDG avidity associated with subacute fracture deformities in the right eighth and ninth ribs, favored posttraumatic. 8. Aortic Atherosclerosis (ICD10-I70.0) and Emphysema (ICD10-J43.9). Findings regarding the possible loculated pneumothorax versus artifact in the left lower lobe will be called to the ordering clinician or representative by the Radiologist Assistant, and communication documented in the PACS or [employer]. Electronically Signed   By: Dahlia Bailiff M.D.   On: 05/07/2022 11:22   CT CHEST WO CONTRAST  Result Date: 04/26/2022 CLINICAL DATA:  Cough.  Lung mass. EXAM: CT CHEST WITHOUT CONTRAST TECHNIQUE: Multidetector CT imaging of the chest was performed following the standard protocol without IV contrast. RADIATION DOSE REDUCTION: This exam was performed according to the departmental dose-optimization program which includes automated exposure control, adjustment of the mA and/or kV according to patient size  and/or use of iterative reconstruction technique. COMPARISON:  Chest radiograph 04/17/2022 and chest CT FINDINGS: Cardiovascular: Normal heart size. Aortic atherosclerosis and coronary artery calcifications. No pericardial effusion. Mediastinum/Nodes: Thyroid gland, trachea and esophagus are unremarkable. No enlarged axillary or mediastinal lymph nodes. Hilar lymph nodes are suboptimally evaluated due to lack of IV contrast. Lungs/Pleura: Mild centrilobular and paraseptal emphysema. No pleural fluid or airspace disease. -Subsegmental atelectasis versus scar noted within the anterior left lower lobe, image 114/4. -Within the anterior right lower lobe, abutting the major fissure, there is a cavitary mass measuring 3.8 by 2.4 by 2.9 cm, image 94/4. The wall thickness of  this mass measures up to 6 mm. -subpleural nodule in the medial right upper lobe measures 4 mm, image 32/4. -Calcified granuloma identified within the right middle lobe. Upper Abdomen: No acute abnormality. Right adrenal gland adenoma measures 1.5 cm and -10 Hounsfield units, image 138/3. No follow-up imaging recommended. Musculoskeletal: No chest wall mass or suspicious bone lesions identified. Subacute fracture deformities are noted involving the anterior aspect of the right eighth and ninth ribs, image 135/4. IMPRESSION: 1. There is a 3.8 cm cavitary mass within the anterior right lower lobe, abutting the major fissure. Differential considerations include malignancy (such as squamous cell carcinoma) versus sequelae of inflammation or infection. In the absence of signs/symptoms of infection referral to pulmonary medicine or thoracic surgery is advised for further management. 2. 4 mm subpleural nodule in the medial right upper lobe is nonspecific. Attention on follow-up imaging is advised. 3. Subacute fracture deformities involving the anterior aspect of the right eighth and ninth ribs. 4. Coronary artery calcifications. 5. Aortic Atherosclerosis  (ICD10-I70.0) and Emphysema (ICD10-J43.9). These results will be called to the ordering clinician or representative by the Radiologist Assistant, and communication documented in the PACS or [employer]. Electronically Signed   By: Kerby Moors M.D.   On: 04/26/2022 09:38

## 2022-05-24 NOTE — Progress Notes (Signed)
Judson Roch, thanks for seeing her. She is seeing Richardson Landry soon. Pft pet and brain mri complete.   Thanks,  BLI  Garner Nash, DO [facility] 05/24/2022 4:58 PM

## 2022-05-24 NOTE — Patient Instructions (Addendum)
[place] at [facility] Discharge Instructions   You were seen and examined today by Dr. Delton Coombes.  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   He discussed the results of your brain scan which was normal.  He discussed the results of your PET scan which shows the cancer in the right lung. There is no evidence of cancer elsewhere in the body.   We will arrange for you to see a surgeon to see if he can remove the cancer. We will arrange for you to have pulmonary function testing prior to this visit.   Return as scheduled.    Thank you for choosing [place] at [facility] to provide your oncology and hematology care.  To afford each patient quality time with our provider, please arrive at least 15 minutes before your scheduled appointment time.   If you have a lab appointment with the [facility] please come in thru the Main Entrance and check in at the main information desk.  You need to re-schedule your appointment should you arrive 10 or more minutes late.  We strive to give you quality time with our providers, and arriving late affects you and other patients whose appointments are after yours.  Also, if you no show three or more times for appointments you may be dismissed from the clinic at the providers discretion.     Again, thank you for choosing [facility].  Our hope is that these requests will decrease the amount of time that you wait before being seen by our physicians.       _____________________________________________________________  Should you have questions after your visit to [facility], please contact our office at [phone] and follow the prompts.  Our office hours are 8:00 a.m. and 4:30 p.m. Monday - Friday.  Please note that voicemails left after 4:00 p.m. may not be returned until the following business day.  We are  closed weekends and major holidays.  You do have access to a nurse 24-7, just call the main number to the clinic [phone] and do not press any options, hold on the line and a nurse will answer the phone.    For prescription refill requests, have your pharmacy contact our office and allow 72 hours.    Due to Covid, you will need to wear a mask upon entering the hospital. If you do not have a mask, a mask will be given to you at the Main Entrance upon arrival. For doctor visits, patients may have 1 support person age 29 or older with them. For treatment visits, patients can not have anyone with them due to social distancing guidelines and our immunocompromised population.

## 2022-05-26 ENCOUNTER — Ambulatory Visit (HOSPITAL_COMMUNITY)
Admission: RE | Admit: 2022-05-26 | Discharge: 2022-05-26 | Disposition: A | Discharge status: Home / Self Care | Payer: PPO | Source: Ambulatory Visit | Attending: Hematology | Admitting: Hematology

## 2022-05-26 DIAGNOSIS — C3431 Malignant neoplasm of lower lobe, right bronchus or lung: Secondary | ICD-10-CM | POA: Diagnosis present

## 2022-05-26 LAB — PULMONARY FUNCTION TEST
DL/VA % pred: 76 %
DL/VA: 3.15 ml/min/mmHg/L
DLCO cor % pred: 59 %
DLCO cor: 12.1 ml/min/mmHg
DLCO unc % pred: 54 %
DLCO unc: 11.19 ml/min/mmHg
FEF 25-75 Post: 1.23 L/sec
FEF 25-75 Pre: 1.5 L/sec
FEF2575-%Change-Post: -17 %
FEF2575-%Pred-Post: 57 %
FEF2575-%Pred-Pre: 70 %
FEV1-%Change-Post: -2 %
FEV1-%Pred-Post: 73 %
FEV1-%Pred-Pre: 75 %
FEV1-Post: 1.82 L
FEV1-Pre: 1.86 L
FEV1FVC-%Change-Post: -2 %
FEV1FVC-%Pred-Pre: 94 %
FEV6-%Change-Post: 2 %
FEV6-%Pred-Post: 82 %
FEV6-%Pred-Pre: 80 %
FEV6-Post: 2.56 L
FEV6-Pre: 2.5 L
FEV6FVC-%Change-Post: -1 %
FEV6FVC-%Pred-Post: 102 %
FEV6FVC-%Pred-Pre: 104 %
FVC-%Change-Post: 0 %
FVC-%Pred-Post: 79 %
FVC-%Pred-Pre: 79 %
FVC-Post: 2.59 L
Post FEV1/FVC ratio: 70 %
Post FEV6/FVC ratio: 99 %
Pre FEV1/FVC ratio: 72 %
Pre FEV6/FVC Ratio: 100 %
RV % pred: 123 %
RV: 2.65 L
TLC % pred: 98 %
TLC: 5.14 L

## 2022-05-26 MED ORDER — ALBUTEROL SULFATE (2.5 MG/3ML) 0.083% IN NEBU
2.5000 mg | INHALATION_SOLUTION | Freq: Once | RESPIRATORY_TRACT | Status: AC
  Administered 2022-05-26: 2.5 mg via RESPIRATORY_TRACT

## 2022-05-28 ENCOUNTER — Other Ambulatory Visit: Payer: Self-pay

## 2022-05-28 NOTE — Progress Notes (Signed)
The proposed treatment discussed in conference is for discussion purpose only and is not a binding recommendation.  The patients have not been physically examined, or presented with their treatment options.  Therefore, final treatment plans cannot be decided.  

## 2022-05-31 ENCOUNTER — Encounter (HOSPITAL_COMMUNITY): Payer: Self-pay

## 2022-06-03 ENCOUNTER — Institutional Professional Consult (permissible substitution): Payer: PPO | Admitting: Thoracic Surgery (Cardiothoracic Vascular Surgery)

## 2022-06-03 ENCOUNTER — Encounter: Payer: Self-pay | Admitting: Thoracic Surgery (Cardiothoracic Vascular Surgery)

## 2022-06-03 VITALS — BP 150/88 | HR 77 | Resp 20 | Ht 65.0 in | Wt 125.0 lb

## 2022-06-03 DIAGNOSIS — C3431 Malignant neoplasm of lower lobe, right bronchus or lung: Secondary | ICD-10-CM

## 2022-06-03 NOTE — Progress Notes (Signed)
PCP is [name], MD Referring Provider is Derek Jack, MD  Chief Complaint  Patient presents with   Lung Cancer    Surgical consult    HPI: [name] is sent for consultation regarding squamous cell carcinoma of the right lower lobe.  [name] is a 67 year old woman with a past medical history significant for tobacco abuse, COPD, cervical cancer, hypertension, hypothyroidism, osteoporosis, arthritis, anxiety and depression.  She was experiencing pain in her right chest after leaning over the center console in her car.  Pain persisted so she went to urgent care chest x-ray apparently showed 2 broken ribs but also showed a right lower lobe cavitary lung mass.  CT showed a 3.8 x 2.4 x 2.3 cm cavitary mass in the right lower lobe.  She had a PET/CT which showed the mass was hypermetabolic.  There also was groundglass opacity in the left lower lobe that was mildly hypermetabolic, consistent with inflammatory disease.  She had a Nav bronc and EBUS by Dr. Valeta Harms.  The right lower lobe mass was a squamous cell carcinoma.  Level 7 and 11 R nodes were negative for tumor.  4R was not sampled.  MR of the brain showed no evidence of metastatic disease.  She has had a poor appetite and has lost about 18 pounds over the past 3 months.  She denies any chest pain, pressure, tightness, shortness of breath.  She thinks she could walk a mile without stopping.  No unusual headaches or visual changes.  She did have 3 episodes of syncope last fall.  She had a partial workup including an echocardiogram which showed preserved left ventricular function and no valvular pathology.  No definite etiology was ever determined.  She did go to the only taking her blood pressure medication as needed because her blood pressure was noted to be low.  Zubrod Score: At the time of surgery this patient's most appropriate activity status/level should be described as: []     0    Normal activity, no symptoms [x]     1     Restricted in physical strenuous activity but ambulatory, able to do out light work []     2    Ambulatory and capable of self care, unable to do work activities, up and about >50 % of waking hours                              []     3    Only limited self care, in bed greater than 50% of waking hours []     4    Completely disabled, no self care, confined to bed or chair []     5    Moribund  Past Medical History:  Diagnosis Date   Anxiety    Arthritis    spinal stenosis   Back disorder    "crooked spine"   Bulging lumbar disc    Cervical cancer (HCC)    Complication of anesthesia    ? hypotension 10/2016 at [facility] surgery    Depression    Dyspnea    H/O degenerative disc disease    History of radiation therapy 02/22/17-03/22/17   vaginal cuff 30 Gy in 5 fractions   Hypertension    Hypothyroidism    patient taken off of hypothyroid med in 11/2016    Osteoporosis    Thyroid disease     Past Surgical History:  Procedure Laterality Date   ABDOMINAL  EXPOSURE N/A 09/27/2018   Procedure: ABDOMINAL EXPOSURE;  Surgeon: Serafina Mitchell, MD;  Location: [facility];  Service: Vascular;  Laterality: N/A;   ABDOMINAL HYSTERECTOMY     ANTERIOR AND POSTERIOR REPAIR N/A 11/09/2016   Procedure: ANTERIOR (CYSTOCELE) AND POSTERIOR REPAIR (RECTOCELE);  Surgeon: Jonnie Kind, MD;  Location: [facility];  Service: Gynecology;  Laterality: N/A;   ANTERIOR LUMBAR FUSION Left 09/27/2018   Procedure: ANTERIOR LUMBAR FUSION L5-S1,;  Surgeon: Melina Schools, MD;  Location: [place];  Service: Orthopedics;  Laterality: Left;  4.5 HRS/ DR. Trula Slade ASSIST   BACK SURGERY     BILATERAL SALPINGECTOMY Left 11/09/2016   Procedure: LEFT SALPINGECTOMY;  Surgeon: Jonnie Kind, MD;  Location: [facility];  Service: Gynecology;  Laterality: Left;   BRONCHIAL BIOPSY  05/13/2022   Procedure: BRONCHIAL BIOPSIES;  Surgeon: Garner Nash, DO;  Location: [place] [facility];  Service: Pulmonary;;   BRONCHIAL BRUSHINGS  05/13/2022    Procedure: BRONCHIAL BRUSHINGS;  Surgeon: Garner Nash, DO;  Location: [place];  Service: Pulmonary;;   BRONCHIAL NEEDLE ASPIRATION BIOPSY  05/13/2022   Procedure: BRONCHIAL NEEDLE ASPIRATION BIOPSIES;  Surgeon: Garner Nash, DO;  Location: [place];  Service: Pulmonary;;   CATARACT EXTRACTION W/PHACO Right 07/05/2016   Procedure: CATARACT EXTRACTION PHACO AND INTRAOCULAR LENS PLACEMENT (IOC);  Surgeon: Tonny Branch, MD;  Location: [facility];  Service: Ophthalmology;  Laterality: Right;  CDE: 15.41   CATARACT EXTRACTION W/PHACO Left 07/19/2016   Procedure: CATARACT EXTRACTION PHACO AND INTRAOCULAR LENS PLACEMENT LEFT EYE CDE= 12.65;  Surgeon: Tonny Branch, MD;  Location: [facility];  Service: Ophthalmology;  Laterality: Left;  left   COLONOSCOPY WITH PROPOFOL N/A 09/09/2021   Procedure: COLONOSCOPY WITH PROPOFOL;  Surgeon: Daneil Dolin, MD;  [facility];  Service: Endoscopy;  Laterality: N/A;  10:30am   EYE SURGERY     FINE NEEDLE ASPIRATION  05/13/2022   Procedure: FINE NEEDLE ASPIRATION (FNA) LINEAR;  Surgeon: Garner Nash, DO;  Location: [place];  Service: Pulmonary;;   LUMBAR LAMINECTOMY/DECOMPRESSION MICRODISCECTOMY Left 09/27/2018   Procedure: L4-L5 Lumbar Laminectomy/Decompression;  Surgeon: Melina Schools, MD;  Location: [place];  Service: Orthopedics;  Laterality: Left;   POLYPECTOMY  09/09/2021   Procedure: POLYPECTOMY;  Surgeon: Daneil Dolin, MD;  [facility];  Service: Endoscopy;;   ROBOTIC PELVIC AND PARA-AORTIC LYMPH NODE DISSECTION N/A 01/11/2017   Procedure: XI ROBOTIC BILATERAL PELVIC LYMPH NODE DISSECTION;  Surgeon: [name], MD;  Location: [facility];  Service: Gynecology;  Laterality: N/A;   SALPINGOOPHORECTOMY Right 11/09/2016   Procedure: RIGHT SALPINGO OOPHORECTOMY;  Surgeon: Jonnie Kind, MD;  Location: [facility];  Service: Gynecology;  Laterality: Right;   TUBAL LIGATION     VAGINAL HYSTERECTOMY N/A 11/09/2016   Procedure:  HYSTERECTOMY VAGINAL;  Surgeon: Jonnie Kind, MD;  Location: [facility];  Service: Gynecology;  Laterality: N/A;   VIDEO BRONCHOSCOPY WITH ENDOBRONCHIAL ULTRASOUND Right 05/13/2022   Procedure: VIDEO BRONCHOSCOPY WITH ENDOBRONCHIAL ULTRASOUND;  Surgeon: Garner Nash, DO;  Location: [place];  Service: Pulmonary;  Laterality: Right;    Family History  Problem Relation Age of Onset   Hypertension Mother    Congenital heart disease Mother    Diabetes Mother    Thyroid disease Brother    Other Daughter        bowel issues   Colon cancer Neg Hx    Colon polyps Neg Hx     Social History Social History   Tobacco  Use   Smoking status: Every Day    Packs/day: 1.00    Years: 44.00    Total pack years: 44.00    Types: Cigarettes   Smokeless tobacco: Never   Tobacco comments:    Smokes 7 packs of cigarettes a week. 05/05/2022 [place]  Vaping Use   Vaping Use: Never used  Substance Use Topics   Alcohol use: No    Comment: 01-06-2016 Per pt rarely, 02-05-2016 per pt no but 80yrs ago     Drug use: No    Comment: 02-05-2016 per pt no but about 40 yrs ago    Current Outpatient Medications  Medication Sig Dispense Refill   acetaminophen (TYLENOL) 325 MG tablet Take 650 mg by mouth every 6 (six) hours as needed for headache.     albuterol (VENTOLIN HFA) 108 (90 Base) MCG/ACT inhaler INHALE 2 PUFFS INTO THE LUNGS EVERY 6 HOURS AS NEEDED FOR WHEEZING OR SHORTNESS OF BREATH. 8.5 g 2   alendronate (FOSAMAX) 70 MG tablet Take 70 mg by mouth once a week.     ALPRAZolam (XANAX) 1 MG tablet Take 1 mg by mouth at bedtime. May take additional 1 mg during the day if needed     atorvastatin (LIPITOR) 40 MG tablet Take 40 mg by mouth at bedtime.      benzonatate (TESSALON) 100 MG capsule Take 100 mg by mouth 3 (three) times daily as needed for cough.     FLUoxetine (PROZAC) 40 MG capsule Take 40 mg by mouth daily.     Fluticasone-Umeclidin-Vilant (TRELEGY ELLIPTA) 100-62.5-25 MCG/ACT AEPB Inhale 1  each into the lungs daily. 60 each 2   traZODone (DESYREL) 100 MG tablet Take 100 mg by mouth at bedtime.     hydrochlorothiazide (HYDRODIURIL) 25 MG tablet Take 1 tablet (25 mg total) by mouth daily. (Patient not taking: Reported on 06/03/2022) 90 tablet 3   lisinopril (ZESTRIL) 10 MG tablet Take 10 mg by mouth daily as needed (High blood pressure). (Patient not taking: Reported on 06/03/2022)     No current facility-administered medications for this visit.    Allergies  Allergen Reactions   Zithromax [Azithromycin] Rash and Other (See Comments)    Blisters in mouth    Prednisone Nausea Only    Sick to stomach Can take in low dose    Review of Systems  Constitutional:  Positive for appetite change and unexpected weight change (Has lost 18 pounds in 3 months). Negative for activity change and fatigue.  HENT:  Negative for trouble swallowing and voice change.   Eyes:  Negative for visual disturbance.  Respiratory:  Positive for cough and wheezing. Negative for shortness of breath.   Cardiovascular:  Negative for chest pain, palpitations and leg swelling.  Gastrointestinal:  Negative for abdominal distention and abdominal pain.  Genitourinary:  Negative for difficulty urinating and dysuria.  Neurological:  Positive for dizziness and syncope (3 episodes last fall, workup negative).  Hematological:  Bruises/bleeds easily.  Psychiatric/Behavioral:  Positive for dysphoric mood. The patient is nervous/anxious.   All other systems reviewed and are negative.   BP (!) 150/88   Pulse 77   Resp 20   Ht 5\' 5"  (1.651 m)   Wt 125 lb (56.7 kg)   SpO2 93% Comment: RA  BMI 20.80 kg/m  Physical Exam Vitals reviewed.  Constitutional:      General: She is not in acute distress.    Appearance: Normal appearance.  HENT:     Head: Normocephalic and atraumatic.  Eyes:     General: No scleral icterus.    Extraocular Movements: Extraocular movements intact.  Cardiovascular:     Rate and Rhythm:  Normal rate and regular rhythm.     Heart sounds: Normal heart sounds. No murmur heard.    No friction rub.  Pulmonary:     Effort: Pulmonary effort is normal. No respiratory distress.     Breath sounds: Normal breath sounds. No wheezing or rales.  Abdominal:     General: There is no distension.     Palpations: Abdomen is soft.  Lymphadenopathy:     Cervical: No cervical adenopathy.  Skin:    General: Skin is warm and dry.  Neurological:     General: No focal deficit present.     Mental Status: She is alert and oriented to person, place, and time.     Cranial Nerves: No cranial nerve deficit.     Motor: No weakness.     Diagnostic Tests: CT CHEST WITHOUT CONTRAST   TECHNIQUE: Multidetector CT imaging of the chest was performed following the standard protocol without IV contrast.   RADIATION DOSE REDUCTION: This exam was performed according to the departmental dose-optimization program which includes automated exposure control, adjustment of the mA and/or kV according to patient size and/or use of iterative reconstruction technique.   COMPARISON:  Chest radiograph 04/17/2022 and chest CT   FINDINGS: Cardiovascular: Normal heart size. Aortic atherosclerosis and coronary artery calcifications. No pericardial effusion.   Mediastinum/Nodes: Thyroid gland, trachea and esophagus are unremarkable. No enlarged axillary or mediastinal lymph nodes. Hilar lymph nodes are suboptimally evaluated due to lack of IV contrast.   Lungs/Pleura: Mild centrilobular and paraseptal emphysema. No pleural fluid or airspace disease.   -Subsegmental atelectasis versus scar noted within the anterior left lower lobe, image 114/4.   -Within the anterior right lower lobe, abutting the major fissure, there is a cavitary mass measuring 3.8 by 2.4 by 2.9 cm, image 94/4. The wall thickness of this mass measures up to 6 mm.   -subpleural nodule in the medial right upper lobe measures 4 mm, image  32/4.   -Calcified granuloma identified within the right middle lobe.   Upper Abdomen: No acute abnormality. Right adrenal gland adenoma measures 1.5 cm and -10 Hounsfield units, image 138/3. No follow-up imaging recommended.   Musculoskeletal: No chest wall mass or suspicious bone lesions identified. Subacute fracture deformities are noted involving the anterior aspect of the right eighth and ninth ribs, image 135/4.   IMPRESSION: 1. There is a 3.8 cm cavitary mass within the anterior right lower lobe, abutting the major fissure. Differential considerations include malignancy (such as squamous cell carcinoma) versus sequelae of inflammation or infection. In the absence of signs/symptoms of infection referral to pulmonary medicine or thoracic surgery is advised for further management. 2. 4 mm subpleural nodule in the medial right upper lobe is nonspecific. Attention on follow-up imaging is advised. 3. Subacute fracture deformities involving the anterior aspect of the right eighth and ninth ribs. 4. Coronary artery calcifications. 5. Aortic Atherosclerosis (ICD10-I70.0) and Emphysema (ICD10-J43.9).   These results will be called to the ordering clinician or representative by the Radiologist Assistant, and communication documented in the PACS or [employer].     Electronically Signed   By: Kerby Moors M.D.   On: 04/26/2022 09:38 NUCLEAR MEDICINE PET SKULL BASE TO THIGH   TECHNIQUE: 6.71 mCi F-18 FDG was injected intravenously. Full-ring PET imaging was performed from the skull base to thigh after the  radiotracer. CT data was obtained and used for attenuation correction and anatomic localization.   Fasting blood glucose: 112 mg/dl   COMPARISON:  Chest CT April 26, 2022 CT abdomen pelvis December 30, 2016   FINDINGS: Mediastinal blood pool activity: SUV max 1.9   Liver activity: SUV max NA   NECK: No hypermetabolic cervical adenopathy.   Incidental CT  findings: None.   CHEST: Cavitary hypermetabolic mass in the right lower lobe measures 3.8 x 2.3 cm on image 45/8 with a max SUV 17.8.   Hypermetabolic multifocal nodularity and ground-glass in the left lower lobe with a max SUV of 2.8. Small left pleural effusion without significant abnormal FDG avidity.   In the paramedian subpleural left lower lobe on image 45/8 there is a possible small loculation of gas measuring 19 x 9 mm on image 45/8 without significant peripheral FDG avidity.   Mildly metabolic asymmetric right hilar nodal tissue with a max SUV of 4.2.   Prominent precarinal lymph node measures 9 mm in short axis on image 33/8 with a max SUV of 2.2.   Small hiatal hernia with focus of FDG avidity in the distal esophagus with a max SUV of 2.2.   Incidental CT findings: Aortic atherosclerosis. Centrilobular and paraseptal emphysema.   ABDOMEN/PELVIS: No abnormal hypermetabolic activity within the liver, pancreas, adrenal glands, or spleen. No hypermetabolic lymph nodes in the abdomen or pelvis.   Incidental CT findings: Stable benign left adrenal adenoma on image 99/3. Left upper pole renal cortical calcifications. Aortic atherosclerosis.   SKELETON: Focal FDG avidity associated with subacute fracture deformities in the right eighth and ninth ribs with a max SUV of 3.8 and 4.0 respectively.   Incidental CT findings: Anterior lumbosacral fusion hardware. Multilevel degenerative changes spine   IMPRESSION: 1. Cavitary hypermetabolic right lower lobe pulmonary mass, consistent with primary bronchogenic neoplasm. 2. Hypermetabolic multifocal nodularity and ground-glass in the left lower lobe is new from prior and favored to reflect an infectious or inflammatory etiology. 3. Possible focal gas in the paramedian subpleural left lower lobe without significant peripheral FDG avidity may reflect artifact or a loculated pneumothorax. Suggest further evaluation with  dedicated chest CT. 4. Mildly metabolic asymmetric right hilar nodal tissue with a max SUV of 4.2, suspicious for nodal disease involvement. 5. Prominent precarinal lymph node with a max SUV of 2.2, nonspecific and may be reactive or reflect nodal disease involvement. Consider further evaluation with direct tissue sampling. 6. Small hiatal hernia with focus of FDG avidity in the distal esophagus, which may reflect reflux esophagitis. Consider further evaluation with direct visualization. 7. Focal FDG avidity associated with subacute fracture deformities in the right eighth and ninth ribs, favored posttraumatic. 8. Aortic Atherosclerosis (ICD10-I70.0) and Emphysema (ICD10-J43.9).   Findings regarding the possible loculated pneumothorax versus artifact in the left lower lobe will be called to the ordering clinician or representative by the Radiologist Assistant, and communication documented in the PACS or [employer].     Electronically Signed   By: Dahlia Bailiff M.D.   On: 05/07/2022 11:22 I personally reviewed the CT and PET/CT images.  There is a 3.8 cm cavitary right lower lobe mass that is markedly hypermetabolic.  Minimal uptake in precarinal node and mild uptake in the right hilar region.  There is a small triangular-shaped subpleural nodule in the right upper lobe too small to characterize on PET.  Fractures of right eighth and ninth ribs.  Groundglass opacity in left lower lobe consistent with pneumonia.  Pulmonary  function testing FVC  (79%) FEV1 1.86 (75%) FEV1 1.82 (73%) postbronchodilator DLCO 12.10 (59%)  MR brain-no mets Right lower lobe mass squamous cell carcinoma Level 4R and 11 R nodes negative.  4R not sampled  Impression: [name] is a 68 year old woman with a past medical history significant for tobacco abuse, COPD, cervical cancer, hypertension, hypothyroidism, osteoporosis, arthritis, anxiety and depression.  Recently found to have a cavitary  right lower lobe lung mass.  Biopsy showed squamous cell carcinoma..  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  Treatment of choice is surgical resection.  Depending on final pathologic measurements might also require adjuvant chemotherapy if a T2b lesion.  Chemoradiation is an alternative treatment, but surgery offers a better chance of a cure.  She has a tiny triangular-shaped nodule in the right upper lobe that is subpleural.  We might be able to identify that and do a wedge resection at the time of surgery but the index of suspicion with that lesion is low.  She also had some groundglass opacity in the left lower lobe consistent with pneumonia or some other type of inflammatory response.  She has not had any fevers, chills, sweats.  Possible viral.  I discussed the proposed operative procedure with Mrs. Geoffroy and her husband.  I informed them of the general nature of the procedure including the need for general anesthesia, the incisions to be used, the use of the surgical robot, the use of drains to postoperatively, the expected hospital stay, and the overall recovery.  They understand there is no guarantee of a cure with surgery.  I informed her of the indications, risks, benefits, and alternatives.  She understands the risks include, but not limited to death, MI, DVT, PE, bleeding, possible need for transfusion, infection, prolonged air leak, cardiac arrhythmias, as well as the possibility of other unforeseeable complications.  Rib fractures - I did inform her of the risk of rib fractures with use of the robot or any other type of thoracic surgery for that matter.  She understands that she is at higher risk given her recent rib fractures with relatively minimal trauma.  She understands that there is sometimes significant pain associated with that.  She understands and accepts the risks and wishes to proceed with surgery.  Tobacco abuse-1 pack/day for almost 50 years.   She needs to quit smoking.  I emphasized the importance of that to her.  She does not have any anginal type symptoms.  She had an echocardiogram which showed preserved left-ventricular function with no valvular pathology about 2 months ago.  No indication to do a cardiac workup preoperatively.  Plan: Quit smoking Robotic assisted right lower lobectomy, possible right upper lobe wedge resection on Monday, 06/14/2022  Melrose Nakayama, MD [facility] [phone]

## 2022-06-03 NOTE — H&P (View-Only) (Signed)
PCP is [name], MD Referring Provider is Derek Jack, MD  Chief Complaint  Patient presents with   Lung Cancer    Surgical consult    HPI: Mrs. Eisenmenger is sent for consultation regarding squamous cell carcinoma of the right lower lobe.  Mariangela Heldt is a 67 year old woman with a past medical history significant for tobacco abuse, COPD, cervical cancer, hypertension, hypothyroidism, osteoporosis, arthritis, anxiety and depression.  She was experiencing pain in her right chest after leaning over the center console in her car.  Pain persisted so she went to urgent care chest x-ray apparently showed 2 broken ribs but also showed a right lower lobe cavitary lung mass.  CT showed a 3.8 x 2.4 x 2.3 cm cavitary mass in the right lower lobe.  She had a PET/CT which showed the mass was hypermetabolic.  There also was groundglass opacity in the left lower lobe that was mildly hypermetabolic, consistent with inflammatory disease.  She had a Nav bronc and EBUS by Dr. Valeta Harms.  The right lower lobe mass was a squamous cell carcinoma.  Level 7 and 11 R nodes were negative for tumor.  4R was not sampled.  MR of the brain showed no evidence of metastatic disease.  She has had a poor appetite and has lost about 18 pounds over the past 3 months.  She denies any chest pain, pressure, tightness, shortness of breath.  She thinks she could walk a mile without stopping.  No unusual headaches or visual changes.  She did have 3 episodes of syncope last fall.  She had a partial workup including an echocardiogram which showed preserved left ventricular function and no valvular pathology.  No definite etiology was ever determined.  She did go to the only taking her blood pressure medication as needed because her blood pressure was noted to be low.  Zubrod Score: At the time of surgery this patient's most appropriate activity status/level should be described as: []     0    Normal activity, no symptoms [x]     1     Restricted in physical strenuous activity but ambulatory, able to do out light work []     2    Ambulatory and capable of self care, unable to do work activities, up and about >50 % of waking hours                              []     3    Only limited self care, in bed greater than 50% of waking hours []     4    Completely disabled, no self care, confined to bed or chair []     5    Moribund  Past Medical History:  Diagnosis Date   Anxiety    Arthritis    spinal stenosis   Back disorder    "crooked spine"   Bulging lumbar disc    Cervical cancer (HCC)    Complication of anesthesia    ? hypotension 10/2016 at [facility] surgery    Depression    Dyspnea    H/O degenerative disc disease    History of radiation therapy 02/22/17-03/22/17   vaginal cuff 30 Gy in 5 fractions   Hypertension    Hypothyroidism    patient taken off of hypothyroid med in 11/2016    Osteoporosis    Thyroid disease     Past Surgical History:  Procedure Laterality Date   ABDOMINAL  EXPOSURE N/A 09/27/2018   Procedure: ABDOMINAL EXPOSURE;  Surgeon: Serafina Mitchell, MD;  Location: [facility];  Service: Vascular;  Laterality: N/A;   ABDOMINAL HYSTERECTOMY     ANTERIOR AND POSTERIOR REPAIR N/A 11/09/2016   Procedure: ANTERIOR (CYSTOCELE) AND POSTERIOR REPAIR (RECTOCELE);  Surgeon: Jonnie Kind, MD;  Location: [facility];  Service: Gynecology;  Laterality: N/A;   ANTERIOR LUMBAR FUSION Left 09/27/2018   Procedure: ANTERIOR LUMBAR FUSION L5-S1,;  Surgeon: Melina Schools, MD;  Location: [place];  Service: Orthopedics;  Laterality: Left;  4.5 HRS/ DR. Trula Slade ASSIST   BACK SURGERY     BILATERAL SALPINGECTOMY Left 11/09/2016   Procedure: LEFT SALPINGECTOMY;  Surgeon: Jonnie Kind, MD;  Location: [facility];  Service: Gynecology;  Laterality: Left;   BRONCHIAL BIOPSY  05/13/2022   Procedure: BRONCHIAL BIOPSIES;  Surgeon: Garner Nash, DO;  Location: [place] [facility];  Service: Pulmonary;;   BRONCHIAL BRUSHINGS  05/13/2022    Procedure: BRONCHIAL BRUSHINGS;  Surgeon: Garner Nash, DO;  Location: [place];  Service: Pulmonary;;   BRONCHIAL NEEDLE ASPIRATION BIOPSY  05/13/2022   Procedure: BRONCHIAL NEEDLE ASPIRATION BIOPSIES;  Surgeon: Garner Nash, DO;  Location: [place];  Service: Pulmonary;;   CATARACT EXTRACTION W/PHACO Right 07/05/2016   Procedure: CATARACT EXTRACTION PHACO AND INTRAOCULAR LENS PLACEMENT (IOC);  Surgeon: Tonny Branch, MD;  Location: [facility];  Service: Ophthalmology;  Laterality: Right;  CDE: 15.41   CATARACT EXTRACTION W/PHACO Left 07/19/2016   Procedure: CATARACT EXTRACTION PHACO AND INTRAOCULAR LENS PLACEMENT LEFT EYE CDE= 12.65;  Surgeon: Tonny Branch, MD;  Location: [facility];  Service: Ophthalmology;  Laterality: Left;  left   COLONOSCOPY WITH PROPOFOL N/A 09/09/2021   Procedure: COLONOSCOPY WITH PROPOFOL;  Surgeon: Daneil Dolin, MD;  [facility];  Service: Endoscopy;  Laterality: N/A;  10:30am   EYE SURGERY     FINE NEEDLE ASPIRATION  05/13/2022   Procedure: FINE NEEDLE ASPIRATION (FNA) LINEAR;  Surgeon: Garner Nash, DO;  Location: [place];  Service: Pulmonary;;   LUMBAR LAMINECTOMY/DECOMPRESSION MICRODISCECTOMY Left 09/27/2018   Procedure: L4-L5 Lumbar Laminectomy/Decompression;  Surgeon: Melina Schools, MD;  Location: [place];  Service: Orthopedics;  Laterality: Left;   POLYPECTOMY  09/09/2021   Procedure: POLYPECTOMY;  Surgeon: Daneil Dolin, MD;  [facility];  Service: Endoscopy;;   ROBOTIC PELVIC AND PARA-AORTIC LYMPH NODE DISSECTION N/A 01/11/2017   Procedure: XI ROBOTIC BILATERAL PELVIC LYMPH NODE DISSECTION;  Surgeon: [name], MD;  Location: [facility];  Service: Gynecology;  Laterality: N/A;   SALPINGOOPHORECTOMY Right 11/09/2016   Procedure: RIGHT SALPINGO OOPHORECTOMY;  Surgeon: Jonnie Kind, MD;  Location: [facility];  Service: Gynecology;  Laterality: Right;   TUBAL LIGATION     VAGINAL HYSTERECTOMY N/A 11/09/2016   Procedure:  HYSTERECTOMY VAGINAL;  Surgeon: Jonnie Kind, MD;  Location: [facility];  Service: Gynecology;  Laterality: N/A;   VIDEO BRONCHOSCOPY WITH ENDOBRONCHIAL ULTRASOUND Right 05/13/2022   Procedure: VIDEO BRONCHOSCOPY WITH ENDOBRONCHIAL ULTRASOUND;  Surgeon: Garner Nash, DO;  Location: [place];  Service: Pulmonary;  Laterality: Right;    Family History  Problem Relation Age of Onset   Hypertension Mother    Congenital heart disease Mother    Diabetes Mother    Thyroid disease Brother    Other Daughter        bowel issues   Colon cancer Neg Hx    Colon polyps Neg Hx     Social History Social History   Tobacco  Use   Smoking status: Every Day    Packs/day: 1.00    Years: 44.00    Total pack years: 44.00    Types: Cigarettes   Smokeless tobacco: Never   Tobacco comments:    Smokes 7 packs of cigarettes a week. 05/05/2022 [place]  Vaping Use   Vaping Use: Never used  Substance Use Topics   Alcohol use: No    Comment: 01-06-2016 Per pt rarely, 02-05-2016 per pt no but 80yrs ago     Drug use: No    Comment: 02-05-2016 per pt no but about 40 yrs ago    Current Outpatient Medications  Medication Sig Dispense Refill   acetaminophen (TYLENOL) 325 MG tablet Take 650 mg by mouth every 6 (six) hours as needed for headache.     albuterol (VENTOLIN HFA) 108 (90 Base) MCG/ACT inhaler INHALE 2 PUFFS INTO THE LUNGS EVERY 6 HOURS AS NEEDED FOR WHEEZING OR SHORTNESS OF BREATH. 8.5 g 2   alendronate (FOSAMAX) 70 MG tablet Take 70 mg by mouth once a week.     ALPRAZolam (XANAX) 1 MG tablet Take 1 mg by mouth at bedtime. May take additional 1 mg during the day if needed     atorvastatin (LIPITOR) 40 MG tablet Take 40 mg by mouth at bedtime.      benzonatate (TESSALON) 100 MG capsule Take 100 mg by mouth 3 (three) times daily as needed for cough.     FLUoxetine (PROZAC) 40 MG capsule Take 40 mg by mouth daily.     Fluticasone-Umeclidin-Vilant (TRELEGY ELLIPTA) 100-62.5-25 MCG/ACT AEPB Inhale 1  each into the lungs daily. 60 each 2   traZODone (DESYREL) 100 MG tablet Take 100 mg by mouth at bedtime.     hydrochlorothiazide (HYDRODIURIL) 25 MG tablet Take 1 tablet (25 mg total) by mouth daily. (Patient not taking: Reported on 06/03/2022) 90 tablet 3   lisinopril (ZESTRIL) 10 MG tablet Take 10 mg by mouth daily as needed (High blood pressure). (Patient not taking: Reported on 06/03/2022)     No current facility-administered medications for this visit.    Allergies  Allergen Reactions   Zithromax [Azithromycin] Rash and Other (See Comments)    Blisters in mouth    Prednisone Nausea Only    Sick to stomach Can take in low dose    Review of Systems  Constitutional:  Positive for appetite change and unexpected weight change (Has lost 18 pounds in 3 months). Negative for activity change and fatigue.  HENT:  Negative for trouble swallowing and voice change.   Eyes:  Negative for visual disturbance.  Respiratory:  Positive for cough and wheezing. Negative for shortness of breath.   Cardiovascular:  Negative for chest pain, palpitations and leg swelling.  Gastrointestinal:  Negative for abdominal distention and abdominal pain.  Genitourinary:  Negative for difficulty urinating and dysuria.  Neurological:  Positive for dizziness and syncope (3 episodes last fall, workup negative).  Hematological:  Bruises/bleeds easily.  Psychiatric/Behavioral:  Positive for dysphoric mood. The patient is nervous/anxious.   All other systems reviewed and are negative.   BP (!) 150/88   Pulse 77   Resp 20   Ht 5\' 5"  (1.651 m)   Wt 125 lb (56.7 kg)   SpO2 93% Comment: RA  BMI 20.80 kg/m  Physical Exam Vitals reviewed.  Constitutional:      General: She is not in acute distress.    Appearance: Normal appearance.  HENT:     Head: Normocephalic and atraumatic.  Eyes:     General: No scleral icterus.    Extraocular Movements: Extraocular movements intact.  Cardiovascular:     Rate and Rhythm:  Normal rate and regular rhythm.     Heart sounds: Normal heart sounds. No murmur heard.    No friction rub.  Pulmonary:     Effort: Pulmonary effort is normal. No respiratory distress.     Breath sounds: Normal breath sounds. No wheezing or rales.  Abdominal:     General: There is no distension.     Palpations: Abdomen is soft.  Lymphadenopathy:     Cervical: No cervical adenopathy.  Skin:    General: Skin is warm and dry.  Neurological:     General: No focal deficit present.     Mental Status: She is alert and oriented to person, place, and time.     Cranial Nerves: No cranial nerve deficit.     Motor: No weakness.     Diagnostic Tests: CT CHEST WITHOUT CONTRAST   TECHNIQUE: Multidetector CT imaging of the chest was performed following the standard protocol without IV contrast.   RADIATION DOSE REDUCTION: This exam was performed according to the departmental dose-optimization program which includes automated exposure control, adjustment of the mA and/or kV according to patient size and/or use of iterative reconstruction technique.   COMPARISON:  Chest radiograph 04/17/2022 and chest CT   FINDINGS: Cardiovascular: Normal heart size. Aortic atherosclerosis and coronary artery calcifications. No pericardial effusion.   Mediastinum/Nodes: Thyroid gland, trachea and esophagus are unremarkable. No enlarged axillary or mediastinal lymph nodes. Hilar lymph nodes are suboptimally evaluated due to lack of IV contrast.   Lungs/Pleura: Mild centrilobular and paraseptal emphysema. No pleural fluid or airspace disease.   -Subsegmental atelectasis versus scar noted within the anterior left lower lobe, image 114/4.   -Within the anterior right lower lobe, abutting the major fissure, there is a cavitary mass measuring 3.8 by 2.4 by 2.9 cm, image 94/4. The wall thickness of this mass measures up to 6 mm.   -subpleural nodule in the medial right upper lobe measures 4 mm, image  32/4.   -Calcified granuloma identified within the right middle lobe.   Upper Abdomen: No acute abnormality. Right adrenal gland adenoma measures 1.5 cm and -10 Hounsfield units, image 138/3. No follow-up imaging recommended.   Musculoskeletal: No chest wall mass or suspicious bone lesions identified. Subacute fracture deformities are noted involving the anterior aspect of the right eighth and ninth ribs, image 135/4.   IMPRESSION: 1. There is a 3.8 cm cavitary mass within the anterior right lower lobe, abutting the major fissure. Differential considerations include malignancy (such as squamous cell carcinoma) versus sequelae of inflammation or infection. In the absence of signs/symptoms of infection referral to pulmonary medicine or thoracic surgery is advised for further management. 2. 4 mm subpleural nodule in the medial right upper lobe is nonspecific. Attention on follow-up imaging is advised. 3. Subacute fracture deformities involving the anterior aspect of the right eighth and ninth ribs. 4. Coronary artery calcifications. 5. Aortic Atherosclerosis (ICD10-I70.0) and Emphysema (ICD10-J43.9).   These results will be called to the ordering clinician or representative by the Radiologist Assistant, and communication documented in the PACS or [employer].     Electronically Signed   By: Kerby Moors M.D.   On: 04/26/2022 09:38 NUCLEAR MEDICINE PET SKULL BASE TO THIGH   TECHNIQUE: 6.71 mCi F-18 FDG was injected intravenously. Full-ring PET imaging was performed from the skull base to thigh after the  radiotracer. CT data was obtained and used for attenuation correction and anatomic localization.   Fasting blood glucose: 112 mg/dl   COMPARISON:  Chest CT April 26, 2022 CT abdomen pelvis December 30, 2016   FINDINGS: Mediastinal blood pool activity: SUV max 1.9   Liver activity: SUV max NA   NECK: No hypermetabolic cervical adenopathy.   Incidental CT  findings: None.   CHEST: Cavitary hypermetabolic mass in the right lower lobe measures 3.8 x 2.3 cm on image 45/8 with a max SUV 17.8.   Hypermetabolic multifocal nodularity and ground-glass in the left lower lobe with a max SUV of 2.8. Small left pleural effusion without significant abnormal FDG avidity.   In the paramedian subpleural left lower lobe on image 45/8 there is a possible small loculation of gas measuring 19 x 9 mm on image 45/8 without significant peripheral FDG avidity.   Mildly metabolic asymmetric right hilar nodal tissue with a max SUV of 4.2.   Prominent precarinal lymph node measures 9 mm in short axis on image 33/8 with a max SUV of 2.2.   Small hiatal hernia with focus of FDG avidity in the distal esophagus with a max SUV of 2.2.   Incidental CT findings: Aortic atherosclerosis. Centrilobular and paraseptal emphysema.   ABDOMEN/PELVIS: No abnormal hypermetabolic activity within the liver, pancreas, adrenal glands, or spleen. No hypermetabolic lymph nodes in the abdomen or pelvis.   Incidental CT findings: Stable benign left adrenal adenoma on image 99/3. Left upper pole renal cortical calcifications. Aortic atherosclerosis.   SKELETON: Focal FDG avidity associated with subacute fracture deformities in the right eighth and ninth ribs with a max SUV of 3.8 and 4.0 respectively.   Incidental CT findings: Anterior lumbosacral fusion hardware. Multilevel degenerative changes spine   IMPRESSION: 1. Cavitary hypermetabolic right lower lobe pulmonary mass, consistent with primary bronchogenic neoplasm. 2. Hypermetabolic multifocal nodularity and ground-glass in the left lower lobe is new from prior and favored to reflect an infectious or inflammatory etiology. 3. Possible focal gas in the paramedian subpleural left lower lobe without significant peripheral FDG avidity may reflect artifact or a loculated pneumothorax. Suggest further evaluation with  dedicated chest CT. 4. Mildly metabolic asymmetric right hilar nodal tissue with a max SUV of 4.2, suspicious for nodal disease involvement. 5. Prominent precarinal lymph node with a max SUV of 2.2, nonspecific and may be reactive or reflect nodal disease involvement. Consider further evaluation with direct tissue sampling. 6. Small hiatal hernia with focus of FDG avidity in the distal esophagus, which may reflect reflux esophagitis. Consider further evaluation with direct visualization. 7. Focal FDG avidity associated with subacute fracture deformities in the right eighth and ninth ribs, favored posttraumatic. 8. Aortic Atherosclerosis (ICD10-I70.0) and Emphysema (ICD10-J43.9).   Findings regarding the possible loculated pneumothorax versus artifact in the left lower lobe will be called to the ordering clinician or representative by the Radiologist Assistant, and communication documented in the PACS or [employer].     Electronically Signed   By: Dahlia Bailiff M.D.   On: 05/07/2022 11:22 I personally reviewed the CT and PET/CT images.  There is a 3.8 cm cavitary right lower lobe mass that is markedly hypermetabolic.  Minimal uptake in precarinal node and mild uptake in the right hilar region.  There is a small triangular-shaped subpleural nodule in the right upper lobe too small to characterize on PET.  Fractures of right eighth and ninth ribs.  Groundglass opacity in left lower lobe consistent with pneumonia.  Pulmonary  function testing FVC  (79%) FEV1 1.86 (75%) FEV1 1.82 (73%) postbronchodilator DLCO 12.10 (59%)  MR brain-no mets Right lower lobe mass squamous cell carcinoma Level 4R and 11 R nodes negative.  4R not sampled  Impression: [name] is a 68 year old woman with a past medical history significant for tobacco abuse, COPD, cervical cancer, hypertension, hypothyroidism, osteoporosis, arthritis, anxiety and depression.  Recently found to have a cavitary  right lower lobe lung mass.  Biopsy showed squamous cell carcinoma..  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  Treatment of choice is surgical resection.  Depending on final pathologic measurements might also require adjuvant chemotherapy if a T2b lesion.  Chemoradiation is an alternative treatment, but surgery offers a better chance of a cure.  She has a tiny triangular-shaped nodule in the right upper lobe that is subpleural.  We might be able to identify that and do a wedge resection at the time of surgery but the index of suspicion with that lesion is low.  She also had some groundglass opacity in the left lower lobe consistent with pneumonia or some other type of inflammatory response.  She has not had any fevers, chills, sweats.  Possible viral.  I discussed the proposed operative procedure with Mrs. Geoffroy and her husband.  I informed them of the general nature of the procedure including the need for general anesthesia, the incisions to be used, the use of the surgical robot, the use of drains to postoperatively, the expected hospital stay, and the overall recovery.  They understand there is no guarantee of a cure with surgery.  I informed her of the indications, risks, benefits, and alternatives.  She understands the risks include, but not limited to death, MI, DVT, PE, bleeding, possible need for transfusion, infection, prolonged air leak, cardiac arrhythmias, as well as the possibility of other unforeseeable complications.  Rib fractures - I did inform her of the risk of rib fractures with use of the robot or any other type of thoracic surgery for that matter.  She understands that she is at higher risk given her recent rib fractures with relatively minimal trauma.  She understands that there is sometimes significant pain associated with that.  She understands and accepts the risks and wishes to proceed with surgery.  Tobacco abuse-1 pack/day for almost 50 years.   She needs to quit smoking.  I emphasized the importance of that to her.  She does not have any anginal type symptoms.  She had an echocardiogram which showed preserved left-ventricular function with no valvular pathology about 2 months ago.  No indication to do a cardiac workup preoperatively.  Plan: Quit smoking Robotic assisted right lower lobectomy, possible right upper lobe wedge resection on Monday, 06/14/2022  Melrose Nakayama, MD [facility] [phone]

## 2022-06-04 ENCOUNTER — Encounter: Payer: Self-pay | Admitting: *Deleted

## 2022-06-04 ENCOUNTER — Other Ambulatory Visit: Payer: Self-pay | Admitting: *Deleted

## 2022-06-04 DIAGNOSIS — C3431 Malignant neoplasm of lower lobe, right bronchus or lung: Secondary | ICD-10-CM

## 2022-06-09 NOTE — Progress Notes (Signed)
Surgical Instructions    Your procedure is scheduled on Monday, June 14, 2022.  Report to [facility] Main Entrance "A" at 5:30 A.M., then check in with the Admitting office.  Call this number if you have problems the morning of surgery:  [phone]   If you have any questions prior to your surgery date call [phone]: Open Monday-Friday 8am-4pm If you experience any cold or flu symptoms such as cough, fever, chills, shortness of breath, etc. between now and your scheduled surgery, please notify us at the above number     Remember:  Do not eat or drink after midnight the night before your surgery    Take these medicines the morning of surgery with A SIP OF WATER:  alendronate (FOSAMAX) (if it is your day to take it) FLUoxetine (PROZAC)  Fluticasone-Umeclidin-Vilant (TRELEGY ELLIPTA) please bring with you to the hospital   As needed: acetaminophen (TYLENOL)  albuterol (VENTOLIN HFA) please bring with you to the hospital ALPRAZolam Duanne Moron)  benzonatate (TESSALON)    As of today, STOP taking any Aspirin (unless otherwise instructed by your surgeon) Aleve, Naproxen, Ibuprofen, Motrin, Advil, Goody's, BC's, all herbal medications, fish oil, and all vitamins.           Do not wear jewelry or makeup. Do not wear lotions, powders, perfumes or deodorant. Do not shave 48 hours prior to surgery.  Do not bring valuables to the hospital. Do not wear nail polish, gel polish, artificial nails, or any other type of covering on natural nails (fingers and toes) If you have artificial nails or gel coating that need to be removed by a nail salon, please have this removed prior to surgery. Artificial nails or gel coating may interfere with anesthesia's ability to adequately monitor your vital signs.  [facility] is not responsible for any belongings or valuables.    Do NOT Smoke (Tobacco/Vaping)  24 hours prior to your procedure  If you use a CPAP at night, you may bring your mask for your  overnight stay.   Contacts, glasses, hearing aids, dentures or partials may not be worn into surgery, please bring cases for these belongings   For patients admitted to the hospital, discharge time will be determined by your treatment team.   Patients discharged the day of surgery will not be allowed to drive home, and someone needs to stay with them for 24 hours.   SURGICAL WAITING ROOM VISITATION Patients having surgery or a procedure may have no more than 2 support people in the waiting area - these visitors may rotate.   Children under the age of 48 must have an adult with them who is not the patient. If the patient needs to stay at the hospital during part of their recovery, the visitor guidelines for inpatient rooms apply. Pre-op nurse will coordinate an appropriate time for 1 support person to accompany patient in pre-op.  This support person may not rotate.   Please refer to [email or web address] for the visitor guidelines for Inpatients (after your surgery is over and you are in a regular room).    Special instructions:    Oral Hygiene is also important to reduce your risk of infection.  Remember - BRUSH YOUR TEETH THE MORNING OF SURGERY WITH YOUR REGULAR TOOTHPASTE   - Preparing For Surgery  Before surgery, you can play an important role. Because skin is not sterile, your skin needs to be as free of germs as possible. You can reduce the number of germs on your  skin by washing with CHG (chlorahexidine gluconate) Soap before surgery.  CHG is an antiseptic cleaner which kills germs and bonds with the skin to continue killing germs even after washing.     Please do not use if you have an allergy to CHG or antibacterial soaps. If your skin becomes reddened/irritated stop using the CHG.  Do not shave (including legs and underarms) for at least 48 hours prior to first CHG shower. It is OK to shave your face.  Please follow  these instructions carefully.     Shower the NIGHT BEFORE SURGERY and the MORNING OF SURGERY with CHG Soap.   If you chose to wash your hair, wash your hair first as usual with your normal shampoo. After you shampoo, rinse your hair and body thoroughly to remove the shampoo.  Then [employer] and genitals (private parts) with your normal soap and rinse thoroughly to remove soap.  After that Use CHG Soap as you would any other liquid soap. You can apply CHG directly to the skin and wash gently with a scrungie or a clean washcloth.   Apply the CHG Soap to your body ONLY FROM THE NECK DOWN.  Do not use on open wounds or open sores. Avoid contact with your eyes, ears, mouth and genitals (private parts). Wash Face and genitals (private parts)  with your normal soap.   Wash thoroughly, paying special attention to the area where your surgery will be performed.  Thoroughly rinse your body with warm water from the neck down.  DO NOT shower/wash with your normal soap after using and rinsing off the CHG Soap.  Pat yourself dry with a CLEAN TOWEL.  Wear CLEAN PAJAMAS to bed the night before surgery  Place CLEAN SHEETS on your bed the night before your surgery  DO NOT SLEEP WITH PETS.   Day of Surgery:  Take a shower with CHG soap. Wear Clean/Comfortable clothing the morning of surgery Do not apply any deodorants/lotions.   Remember to brush your teeth WITH YOUR REGULAR TOOTHPASTE.    If you received a COVID test during your pre-op visit, it is requested that you wear a mask when out in public, stay away from anyone that may not be feeling well, and notify your surgeon if you develop symptoms. If you have been in contact with anyone that has tested positive in the last 10 days, please notify your surgeon.    Please read over the following fact sheets that you were given.

## 2022-06-10 ENCOUNTER — Encounter (HOSPITAL_COMMUNITY): Payer: Self-pay

## 2022-06-10 ENCOUNTER — Encounter (HOSPITAL_COMMUNITY)
Admission: RE | Admit: 2022-06-10 | Discharge: 2022-06-10 | Disposition: A | Discharge status: Home / Self Care | Payer: PPO | Source: Ambulatory Visit | Attending: Thoracic Surgery (Cardiothoracic Vascular Surgery) | Admitting: Thoracic Surgery (Cardiothoracic Vascular Surgery)

## 2022-06-10 ENCOUNTER — Other Ambulatory Visit: Payer: Self-pay

## 2022-06-10 VITALS — BP 161/87 | HR 93 | Temp 98.1°F | Resp 18 | Ht 65.0 in | Wt 123.0 lb

## 2022-06-10 DIAGNOSIS — C3431 Malignant neoplasm of lower lobe, right bronchus or lung: Secondary | ICD-10-CM | POA: Diagnosis not present

## 2022-06-10 DIAGNOSIS — Z01818 Encounter for other preprocedural examination: Secondary | ICD-10-CM

## 2022-06-10 DIAGNOSIS — I10 Essential (primary) hypertension: Secondary | ICD-10-CM

## 2022-06-10 LAB — CBC
HCT: 40.1 % (ref 36.0–46.0)
Hemoglobin: 12.8 g/dL (ref 12.0–15.0)
MCH: 27.8 pg (ref 26.0–34.0)
MCHC: 31.9 g/dL (ref 30.0–36.0)
MCV: 87.2 fL (ref 80.0–100.0)
Platelets: 445 10*3/uL — ABNORMAL HIGH (ref 150–400)
RBC: 4.6 MIL/uL (ref 3.87–5.11)
RDW: 13.5 % (ref 11.5–15.5)
WBC: 13.5 10*3/uL — ABNORMAL HIGH (ref 4.0–10.5)
nRBC: 0 % (ref 0.0–0.2)

## 2022-06-10 LAB — TYPE AND SCREEN
ABO/RH(D): A POS
Antibody Screen: NEGATIVE

## 2022-06-10 LAB — URINALYSIS, ROUTINE W REFLEX MICROSCOPIC
Bilirubin Urine: NEGATIVE
Glucose, UA: NEGATIVE mg/dL
Hgb urine dipstick: NEGATIVE
Ketones, ur: NEGATIVE mg/dL
Nitrite: NEGATIVE
Protein, ur: 100 mg/dL — AB
Specific Gravity, Urine: 1.014 (ref 1.005–1.030)
pH: 6 (ref 5.0–8.0)

## 2022-06-10 LAB — PROTIME-INR
INR: 1 (ref 0.8–1.2)
Prothrombin Time: 13.1 seconds (ref 11.4–15.2)

## 2022-06-10 LAB — COMPREHENSIVE METABOLIC PANEL
ALT: 10 U/L (ref 0–44)
AST: 15 U/L (ref 15–41)
Albumin: 3.5 g/dL (ref 3.5–5.0)
Alkaline Phosphatase: 100 U/L (ref 38–126)
Anion gap: 12 (ref 5–15)
BUN: 9 mg/dL (ref 8–23)
CO2: 27 mmol/L (ref 22–32)
Calcium: 9.2 mg/dL (ref 8.9–10.3)
Chloride: 97 mmol/L — ABNORMAL LOW (ref 98–111)
Creatinine, Ser: 0.97 mg/dL (ref 0.44–1.00)
GFR, Estimated: >60 mL/min (ref >60)
Glucose, Bld: 100 mg/dL — ABNORMAL HIGH (ref 70–99)
Potassium: 3.4 mmol/L — ABNORMAL LOW (ref 3.5–5.1)
Sodium: 136 mmol/L (ref 135–145)
Total Bilirubin: 0.7 mg/dL (ref 0.3–1.2)
Total Protein: 7.3 g/dL (ref 6.5–8.1)

## 2022-06-10 LAB — APTT: aPTT: 28 seconds (ref 24–36)

## 2022-06-10 LAB — SURGICAL PCR SCREEN
MRSA, PCR: NEGATIVE
Staphylococcus aureus: NEGATIVE

## 2022-06-10 NOTE — Progress Notes (Signed)
PCP - Dr. [name] Cardiologist - dr. Despina Pole Pulmonologist:  Dr. Leory Plowman Icard Oncologist Dr. Derek Jack   PPM/ICD - Denies  Chest x-ray - Dos EKG - today in PAT, 06/10/2022 Stress Test - 03/2022 ECHO - 03/19/2022 Cardiac Cath - Denies  Sleep Study - Denies CPAP - Denies  Non-diabetic  Last dose of GLP1 agonist-  Non-diabetic GLP1 instructions: denies  Blood Thinner Instructions:Denies Aspirin Instructions:Denies  ERAS Protcol -no NPO PRE-SURGERY Ensure or G2- No  COVID TEST- Denies   Anesthesia review: Yes, Lung cancer, exertional SOB, HTN, Hypotension with anesthesia  Patient denies shortness of breath, fever, cough and chest pain at PAT appointment   All instructions explained to the patient, with a verbal understanding of the material. Patient agrees to go over the instructions while at home for a better understanding. Patient also instructed to self quarantine after being tested for COVID-19. The opportunity to ask questions was provided.

## 2022-06-13 NOTE — Anesthesia Preprocedure Evaluation (Signed)
Anesthesia Evaluation  Patient identified by MRN, date of birth, ID band Patient awake    Reviewed: Allergy & Precautions, H&P , NPO status , Patient's Chart, lab work & pertinent test results  History of Anesthesia Complications Negative for: history of anesthetic complications  Airway Mallampati: I  TM Distance: >3 FB Neck ROM: Full    Dental  (+) Edentulous Upper, Edentulous Lower, Dental Advisory Given   Pulmonary Current Smoker and Patient abstained from smoking. Lung Ca   Pulmonary exam normal        Cardiovascular hypertension, Pt. on medications Normal cardiovascular exam     Neuro/Psych   Anxiety Depression    negative neurological ROS  negative psych ROS   GI/Hepatic negative GI ROS, Neg liver ROS,,,  Endo/Other   Hyperthyroidism   Renal/GU negative Renal ROS  negative genitourinary   Musculoskeletal  (+) Arthritis , Osteoarthritis,    Abdominal   Peds negative pediatric ROS (+)  Hematology negative hematology ROS (+)   Anesthesia Other Findings   Reproductive/Obstetrics negative OB ROS                             Anesthesia Physical Anesthesia Plan  ASA: 3  Anesthesia Plan: General   Post-op Pain Management: Dilaudid IV, Tylenol PO (pre-op)* and Toradol IV (intra-op)*   Induction: Intravenous  PONV Risk Score and Plan: 3 and Ondansetron, Midazolam, Treatment may vary due to age or medical condition and Dexamethasone  Airway Management Planned: Oral ETT and Double Lumen EBT  Additional Equipment: Arterial line  Intra-op Plan:   Post-operative Plan: Extubation in OR  Informed Consent: I have reviewed the patients History and Physical, chart, labs and discussed the procedure including the risks, benefits and alternatives for the proposed anesthesia with the patient or authorized representative who has indicated his/her understanding and acceptance.     Dental  advisory given  Plan Discussed with: Anesthesiologist and CRNA  Anesthesia Plan Comments: (PAT note by Karoline Caldwell, PA-C:  Patient recently evaluated bycardiology for presyncope as well as chest discomfort associated with shortness of breath.  Seen by Dr. Dellia Cloud on 03/15/2022.  Per note, her presyncope was felt likely secondary to orthostatic hypotension and her HCTZ was decreased from 50 mg to 25 mg once daily and her lisinopril was decreased from 40 mg to 20 mg once daily. Echocardiogram event monitor, and exercise Myoview ordered for evaluation of chest pain and shortness of breath.  Stress test was low risk.  Echo showed EF 65 to 70%, no significant valvular abnormalities.  Event monitor showed underlying rhythm to be sinus without evidence of pauses or malignant atrial/ventricular arrhythmias.  Current every day smoker with greater than 40-pack-year history.  Recent CT chest 04/26/2022 showed a 3.8 cm cavitary mass in the anterior right lower lobe abutting the major fissure concerning for malignancy.  Referred to pulmonology for consideration of bronchoscopy and biopsy.  Pt will need DOS labs and eval  EKG 04/17/2022: Normal sinus rhythm. ST & T wave abnormality, consider inferior ischemia. ST & T wave abnormality, consider anterolateral ischemia.  Does not appear significantly changed from tracing 03/15/2022.  CT chest 04/26/2022: IMPRESSION: 1. There is a 3.8 cm cavitary mass within the anterior right lower lobe, abutting the major fissure. Differential considerations include malignancy (such as squamous cell carcinoma) versus sequelae of inflammation or infection. In the absence of signs/symptoms of infection referral to pulmonary medicine or thoracic surgery is advised for further management.  2. 4 mm subpleural nodule in the medial right upper lobe is nonspecific. Attention on follow-up imaging is advised. 3. Subacute fracture deformities involving the anterior aspect of the right  eighth and ninth ribs. 4. Coronary artery calcifications. 5. Aortic Atherosclerosis (ICD10-I70.0) and Emphysema (ICD10-J43.9).  Event monitor 04/07/2022: Conclusion: Normal sinus rhythm with no evidence of pauses or malignant atrial/ventricular arrhythmias.   Nuclear stress 03/23/2022:  Findings are consistent with no ischemia. The study is low risk based on perfusion images and normal LVEF.  Equivocal 0.5 mm of horizontal ST depression (II, III, aVF, V5 and V6) was noted. The ECG was negative for ischemia. Intermediate risk Duke treadmill score of 2.5.  LV perfusion is normal. Mild breast attenuation noted at inferoapical septum.  Left ventricular function is normal. Nuclear stress EF: 86 %.  Overall low risk study based on perfusion images showing no definite ischemia and vigorous LVEF of 86%. Equivocal ST segment changes noted with exercise as outlined.  TTE 03/19/2022: 1. Left ventricular ejection fraction, by estimation, is 65 to 70%. The  left ventricle has normal function. The left ventricle has no regional  wall motion abnormalities. Left ventricular diastolic parameters were  normal.  2. Right ventricular systolic function is normal. The right ventricular  size is normal. Tricuspid regurgitation signal is inadequate for assessing  PA pressure.  3. The mitral valve is normal in structure. No evidence of mitral valve  regurgitation. No evidence of mitral stenosis.  4. The aortic valve has an indeterminant number of cusps. There is mild  calcification of the aortic valve. There is mild thickening of the aortic  valve. Aortic valve regurgitation is not visualized. No aortic stenosis is  present.   )        Anesthesia Quick Evaluation

## 2022-06-14 ENCOUNTER — Other Ambulatory Visit: Payer: Self-pay

## 2022-06-14 ENCOUNTER — Inpatient Hospital Stay (HOSPITAL_COMMUNITY): Payer: PPO

## 2022-06-14 ENCOUNTER — Encounter (HOSPITAL_COMMUNITY)
Admission: RE | Disposition: A | Discharge status: Home / Self Care | Payer: Self-pay | Source: Home / Self Care | Attending: Thoracic Surgery (Cardiothoracic Vascular Surgery)

## 2022-06-14 ENCOUNTER — Inpatient Hospital Stay (HOSPITAL_COMMUNITY): Payer: PPO | Admitting: Physician Assistant

## 2022-06-14 ENCOUNTER — Encounter (HOSPITAL_COMMUNITY): Payer: Self-pay | Admitting: Thoracic Surgery (Cardiothoracic Vascular Surgery)

## 2022-06-14 ENCOUNTER — Inpatient Hospital Stay (HOSPITAL_COMMUNITY)
Admission: RE | Admit: 2022-06-14 | Discharge: 2022-06-17 | DRG: 164 | Disposition: A | Discharge status: Home / Self Care | Payer: PPO | Attending: Thoracic Surgery (Cardiothoracic Vascular Surgery) | Admitting: Thoracic Surgery (Cardiothoracic Vascular Surgery)

## 2022-06-14 ENCOUNTER — Inpatient Hospital Stay (HOSPITAL_COMMUNITY): Payer: PPO | Admitting: Anesthesiology

## 2022-06-14 DIAGNOSIS — Z923 Personal history of irradiation: Secondary | ICD-10-CM | POA: Diagnosis not present

## 2022-06-14 DIAGNOSIS — D3502 Benign neoplasm of left adrenal gland: Secondary | ICD-10-CM | POA: Diagnosis present

## 2022-06-14 DIAGNOSIS — F17210 Nicotine dependence, cigarettes, uncomplicated: Secondary | ICD-10-CM | POA: Diagnosis not present

## 2022-06-14 DIAGNOSIS — J432 Centrilobular emphysema: Secondary | ICD-10-CM | POA: Diagnosis present

## 2022-06-14 DIAGNOSIS — Z888 Allergy status to other drugs, medicaments and biological substances status: Secondary | ICD-10-CM

## 2022-06-14 DIAGNOSIS — E039 Hypothyroidism, unspecified: Secondary | ICD-10-CM | POA: Diagnosis present

## 2022-06-14 DIAGNOSIS — F32A Depression, unspecified: Secondary | ICD-10-CM | POA: Diagnosis present

## 2022-06-14 DIAGNOSIS — I700 Atherosclerosis of aorta: Secondary | ICD-10-CM | POA: Diagnosis present

## 2022-06-14 DIAGNOSIS — J438 Other emphysema: Secondary | ICD-10-CM | POA: Diagnosis present

## 2022-06-14 DIAGNOSIS — Z8249 Family history of ischemic heart disease and other diseases of the circulatory system: Secondary | ICD-10-CM

## 2022-06-14 DIAGNOSIS — C3431 Malignant neoplasm of lower lobe, right bronchus or lung: Secondary | ICD-10-CM | POA: Diagnosis present

## 2022-06-14 DIAGNOSIS — I10 Essential (primary) hypertension: Secondary | ICD-10-CM | POA: Diagnosis present

## 2022-06-14 DIAGNOSIS — E871 Hypo-osmolality and hyponatremia: Secondary | ICD-10-CM | POA: Diagnosis present

## 2022-06-14 DIAGNOSIS — Z716 Tobacco abuse counseling: Secondary | ICD-10-CM

## 2022-06-14 DIAGNOSIS — M810 Age-related osteoporosis without current pathological fracture: Secondary | ICD-10-CM | POA: Diagnosis present

## 2022-06-14 DIAGNOSIS — Z9841 Cataract extraction status, right eye: Secondary | ICD-10-CM

## 2022-06-14 DIAGNOSIS — E876 Hypokalemia: Secondary | ICD-10-CM | POA: Diagnosis present

## 2022-06-14 DIAGNOSIS — Z981 Arthrodesis status: Secondary | ICD-10-CM

## 2022-06-14 DIAGNOSIS — Z793 Long term (current) use of hormonal contraceptives: Secondary | ICD-10-CM

## 2022-06-14 DIAGNOSIS — Z8349 Family history of other endocrine, nutritional and metabolic diseases: Secondary | ICD-10-CM | POA: Diagnosis not present

## 2022-06-14 DIAGNOSIS — Z833 Family history of diabetes mellitus: Secondary | ICD-10-CM | POA: Diagnosis not present

## 2022-06-14 DIAGNOSIS — F419 Anxiety disorder, unspecified: Secondary | ICD-10-CM | POA: Diagnosis present

## 2022-06-14 DIAGNOSIS — Z90710 Acquired absence of both cervix and uterus: Secondary | ICD-10-CM

## 2022-06-14 DIAGNOSIS — I2510 Atherosclerotic heart disease of native coronary artery without angina pectoris: Secondary | ICD-10-CM | POA: Diagnosis present

## 2022-06-14 DIAGNOSIS — F418 Other specified anxiety disorders: Secondary | ICD-10-CM | POA: Diagnosis not present

## 2022-06-14 DIAGNOSIS — Z7983 Long term (current) use of bisphosphonates: Secondary | ICD-10-CM

## 2022-06-14 DIAGNOSIS — Z79899 Other long term (current) drug therapy: Secondary | ICD-10-CM

## 2022-06-14 DIAGNOSIS — K449 Diaphragmatic hernia without obstruction or gangrene: Secondary | ICD-10-CM | POA: Diagnosis present

## 2022-06-14 DIAGNOSIS — D62 Acute posthemorrhagic anemia: Secondary | ICD-10-CM | POA: Diagnosis present

## 2022-06-14 DIAGNOSIS — R599 Enlarged lymph nodes, unspecified: Secondary | ICD-10-CM | POA: Diagnosis present

## 2022-06-14 DIAGNOSIS — D3501 Benign neoplasm of right adrenal gland: Secondary | ICD-10-CM | POA: Diagnosis present

## 2022-06-14 DIAGNOSIS — Z8541 Personal history of malignant neoplasm of cervix uteri: Secondary | ICD-10-CM

## 2022-06-14 DIAGNOSIS — Z9842 Cataract extraction status, left eye: Secondary | ICD-10-CM

## 2022-06-14 DIAGNOSIS — Z9079 Acquired absence of other genital organ(s): Secondary | ICD-10-CM

## 2022-06-14 DIAGNOSIS — Z90721 Acquired absence of ovaries, unilateral: Secondary | ICD-10-CM

## 2022-06-14 DIAGNOSIS — J9382 Other air leak: Secondary | ICD-10-CM | POA: Diagnosis not present

## 2022-06-14 DIAGNOSIS — D72829 Elevated white blood cell count, unspecified: Secondary | ICD-10-CM | POA: Diagnosis not present

## 2022-06-14 DIAGNOSIS — Z961 Presence of intraocular lens: Secondary | ICD-10-CM | POA: Diagnosis present

## 2022-06-14 DIAGNOSIS — M1990 Unspecified osteoarthritis, unspecified site: Secondary | ICD-10-CM | POA: Diagnosis present

## 2022-06-14 HISTORY — PX: NODE DISSECTION: SHX5269

## 2022-06-14 HISTORY — PX: INTERCOSTAL NERVE BLOCK: SHX5021

## 2022-06-14 LAB — POCT I-STAT 7, (LYTES, BLD GAS, ICA,H+H)
Acid-base deficit: 2 mmol/L (ref 0.0–2.0)
Bicarbonate: 22.2 mmol/L (ref 20.0–28.0)
Calcium, Ion: 1.22 mmol/L (ref 1.15–1.40)
HCT: 29 % — ABNORMAL LOW (ref 36.0–46.0)
Hemoglobin: 9.9 g/dL — ABNORMAL LOW (ref 12.0–15.0)
O2 Saturation: 99 %
Potassium: 3.3 mmol/L — ABNORMAL LOW (ref 3.5–5.1)
Sodium: 139 mmol/L (ref 135–145)
TCO2: 23 mmol/L (ref 22–32)
pCO2 arterial: 33.3 mmHg (ref 32–48)
pH, Arterial: 7.431 (ref 7.35–7.45)
pO2, Arterial: 130 mmHg — ABNORMAL HIGH (ref 83–108)

## 2022-06-14 SURGERY — LOBECTOMY, LUNG, ROBOT-ASSISTED, USING VATS
Anesthesia: General | Site: Chest | Laterality: Right

## 2022-06-14 MED ORDER — ALBUTEROL SULFATE HFA 108 (90 BASE) MCG/ACT IN AERS
INHALATION_SPRAY | RESPIRATORY_TRACT | Status: DC | PRN
  Administered 2022-06-14 (×2): 2 via RESPIRATORY_TRACT

## 2022-06-14 MED ORDER — DEXAMETHASONE SODIUM PHOSPHATE 10 MG/ML IJ SOLN
INTRAMUSCULAR | Status: DC | PRN
  Administered 2022-06-14: 10 mg via INTRAVENOUS

## 2022-06-14 MED ORDER — GABAPENTIN 300 MG PO CAPS: 300.0000 mg | ORAL_CAPSULE | Freq: Two times a day (BID) | ORAL | Status: DC

## 2022-06-14 MED ORDER — CHLORHEXIDINE GLUCONATE 0.12 % MT SOLN
15.0000 mL | Freq: Once | OROMUCOSAL | Status: AC
  Administered 2022-06-14: 15 mL via OROMUCOSAL
  Filled 2022-06-14: qty 15

## 2022-06-14 MED ORDER — SODIUM CHLORIDE 0.9% IV SOLUTION
INTRAVENOUS | Status: AC | PRN
  Administered 2022-06-14: 1000 mL

## 2022-06-14 MED ORDER — SODIUM CHLORIDE FLUSH 0.9 % IV SOLN
INTRAVENOUS | Status: DC | PRN
  Administered 2022-06-14: 90 mL

## 2022-06-14 MED ORDER — PHENYLEPHRINE 80 MCG/ML (10ML) SYRINGE FOR IV PUSH (FOR BLOOD PRESSURE SUPPORT)
PREFILLED_SYRINGE | INTRAVENOUS | Status: DC | PRN
  Administered 2022-06-14 (×2): 80 ug via INTRAVENOUS

## 2022-06-14 MED ORDER — MIDAZOLAM HCL 2 MG/2ML IJ SOLN
INTRAMUSCULAR | Status: DC | PRN
  Administered 2022-06-14 (×2): 1 mg via INTRAVENOUS

## 2022-06-14 MED ORDER — ALBUTEROL SULFATE (2.5 MG/3ML) 0.083% IN NEBU: 2.5000 mg | INHALATION_SOLUTION | Freq: Four times a day (QID) | RESPIRATORY_TRACT | Status: DC | PRN

## 2022-06-14 MED ORDER — UMECLIDINIUM BROMIDE 62.5 MCG/ACT IN AEPB
1.0000 | INHALATION_SPRAY | Freq: Every day | RESPIRATORY_TRACT | Status: DC
  Filled 2022-06-14: qty 7

## 2022-06-14 MED ORDER — ORAL CARE MOUTH RINSE: 15.0000 mL | OROMUCOSAL | Status: DC | PRN

## 2022-06-14 MED ORDER — KETOROLAC TROMETHAMINE 15 MG/ML IJ SOLN
15.0000 mg | Freq: Four times a day (QID) | INTRAMUSCULAR | Status: AC
  Administered 2022-06-14 – 2022-06-16 (×7): 15 mg via INTRAVENOUS
  Filled 2022-06-14 (×7): qty 1

## 2022-06-14 MED ORDER — HEMOSTATIC AGENTS (NO CHARGE) OPTIME
TOPICAL | Status: DC | PRN
  Administered 2022-06-14 (×3): 1 via TOPICAL

## 2022-06-14 MED ORDER — SODIUM CHLORIDE 0.45 % IV SOLN: INTRAVENOUS | Status: DC

## 2022-06-14 MED ORDER — BISACODYL 5 MG PO TBEC
10.0000 mg | DELAYED_RELEASE_TABLET | Freq: Every day | ORAL | Status: DC
  Administered 2022-06-15 – 2022-06-17 (×3): 10 mg via ORAL
  Filled 2022-06-14 (×3): qty 2

## 2022-06-14 MED ORDER — OXYCODONE HCL 5 MG PO TABS
5.0000 mg | ORAL_TABLET | ORAL | Status: DC | PRN
  Administered 2022-06-15: 10 mg via ORAL
  Filled 2022-06-14: qty 2

## 2022-06-14 MED ORDER — GABAPENTIN 300 MG PO CAPS
300.0000 mg | ORAL_CAPSULE | Freq: Every day | ORAL | Status: AC
  Administered 2022-06-15 – 2022-06-16 (×3): 300 mg via ORAL
  Filled 2022-06-14 (×3): qty 1

## 2022-06-14 MED ORDER — ONDANSETRON HCL 4 MG/2ML IJ SOLN
INTRAMUSCULAR | Status: DC | PRN
  Administered 2022-06-14: 4 mg via INTRAVENOUS

## 2022-06-14 MED ORDER — PROPOFOL 10 MG/ML IV BOLUS
INTRAVENOUS | Status: DC | PRN
  Administered 2022-06-14: 160 mg via INTRAVENOUS

## 2022-06-14 MED ORDER — FLUOXETINE HCL 20 MG PO CAPS
40.0000 mg | ORAL_CAPSULE | Freq: Every day | ORAL | Status: DC
  Administered 2022-06-15 – 2022-06-17 (×3): 40 mg via ORAL
  Filled 2022-06-14 (×3): qty 2

## 2022-06-14 MED ORDER — CEFAZOLIN SODIUM-DEXTROSE 2-4 GM/100ML-% IV SOLN
2.0000 g | INTRAVENOUS | Status: AC
  Administered 2022-06-14: 2 g via INTRAVENOUS
  Filled 2022-06-14: qty 100

## 2022-06-14 MED ORDER — FENTANYL CITRATE (PF) 250 MCG/5ML IJ SOLN
INTRAMUSCULAR | Status: AC
  Filled 2022-06-14: qty 5

## 2022-06-14 MED ORDER — ENOXAPARIN SODIUM 40 MG/0.4ML IJ SOSY
40.0000 mg | PREFILLED_SYRINGE | Freq: Every day | INTRAMUSCULAR | Status: DC
  Administered 2022-06-15 – 2022-06-16 (×3): 40 mg via SUBCUTANEOUS
  Filled 2022-06-14 (×3): qty 0.4

## 2022-06-14 MED ORDER — 0.9 % SODIUM CHLORIDE (POUR BTL) OPTIME
TOPICAL | Status: DC | PRN
  Administered 2022-06-14: 2000 mL

## 2022-06-14 MED ORDER — HYDROMORPHONE HCL 1 MG/ML IJ SOLN
0.2500 mg | INTRAMUSCULAR | Status: DC | PRN
  Administered 2022-06-14: 0.5 mg via INTRAVENOUS

## 2022-06-14 MED ORDER — FLUTICASONE FUROATE-VILANTEROL 100-25 MCG/ACT IN AEPB
1.0000 | INHALATION_SPRAY | Freq: Every day | RESPIRATORY_TRACT | Status: DC
  Filled 2022-06-14: qty 28

## 2022-06-14 MED ORDER — CEFAZOLIN SODIUM-DEXTROSE 2-4 GM/100ML-% IV SOLN
2.0000 g | Freq: Three times a day (TID) | INTRAVENOUS | Status: AC
  Administered 2022-06-14 – 2022-06-15 (×2): 2 g via INTRAVENOUS
  Filled 2022-06-14 (×2): qty 100

## 2022-06-14 MED ORDER — SENNOSIDES-DOCUSATE SODIUM 8.6-50 MG PO TABS
1.0000 | ORAL_TABLET | Freq: Every day | ORAL | Status: DC
  Administered 2022-06-15 – 2022-06-16 (×2): 1 via ORAL
  Filled 2022-06-14 (×2): qty 1

## 2022-06-14 MED ORDER — BUPIVACAINE HCL (PF) 0.5 % IJ SOLN
INTRAMUSCULAR | Status: AC
  Filled 2022-06-14: qty 30

## 2022-06-14 MED ORDER — HYDROMORPHONE HCL 1 MG/ML IJ SOLN
INTRAMUSCULAR | Status: AC
  Administered 2022-06-14: 0.5 mg via INTRAVENOUS
  Filled 2022-06-14: qty 1

## 2022-06-14 MED ORDER — ORAL CARE MOUTH RINSE: 15.0000 mL | Freq: Once | OROMUCOSAL | Status: AC

## 2022-06-14 MED ORDER — MIDAZOLAM HCL 2 MG/2ML IJ SOLN
INTRAMUSCULAR | Status: AC
  Filled 2022-06-14: qty 2

## 2022-06-14 MED ORDER — PROPOFOL 10 MG/ML IV BOLUS
INTRAVENOUS | Status: AC
  Filled 2022-06-14: qty 20

## 2022-06-14 MED ORDER — LACTATED RINGERS IV SOLN: INTRAVENOUS | Status: DC | PRN

## 2022-06-14 MED ORDER — TRAZODONE HCL 50 MG PO TABS
100.0000 mg | ORAL_TABLET | Freq: Every day | ORAL | Status: DC
  Administered 2022-06-15 – 2022-06-16 (×3): 100 mg via ORAL
  Filled 2022-06-14 (×3): qty 2

## 2022-06-14 MED ORDER — MORPHINE SULFATE (PF) 2 MG/ML IV SOLN: 2.0000 mg | INTRAVENOUS | Status: DC | PRN

## 2022-06-14 MED ORDER — CHLORHEXIDINE GLUCONATE CLOTH 2 % EX PADS
6.0000 | MEDICATED_PAD | Freq: Every day | CUTANEOUS | Status: DC
  Administered 2022-06-14 – 2022-06-15 (×2): 6 via TOPICAL

## 2022-06-14 MED ORDER — ACETAMINOPHEN 160 MG/5ML PO SOLN: 1000.0000 mg | Freq: Four times a day (QID) | ORAL | Status: DC

## 2022-06-14 MED ORDER — LISINOPRIL 10 MG PO TABS
10.0000 mg | ORAL_TABLET | Freq: Every day | ORAL | Status: DC | PRN
  Filled 2022-06-14: qty 1

## 2022-06-14 MED ORDER — SUGAMMADEX SODIUM 200 MG/2ML IV SOLN
INTRAVENOUS | Status: DC | PRN
  Administered 2022-06-14: 200 mg via INTRAVENOUS

## 2022-06-14 MED ORDER — PHENYLEPHRINE HCL-NACL 20-0.9 MG/250ML-% IV SOLN
INTRAVENOUS | Status: DC | PRN
  Administered 2022-06-14: 30 ug/min via INTRAVENOUS

## 2022-06-14 MED ORDER — ACETAMINOPHEN 500 MG PO TABS
1000.0000 mg | ORAL_TABLET | Freq: Four times a day (QID) | ORAL | Status: DC
  Administered 2022-06-14 – 2022-06-17 (×11): 1000 mg via ORAL
  Filled 2022-06-14 (×11): qty 2

## 2022-06-14 MED ORDER — FENTANYL CITRATE (PF) 250 MCG/5ML IJ SOLN
INTRAMUSCULAR | Status: DC | PRN
  Administered 2022-06-14: 100 ug via INTRAVENOUS
  Administered 2022-06-14 (×3): 50 ug via INTRAVENOUS

## 2022-06-14 MED ORDER — HYDROMORPHONE HCL 1 MG/ML IJ SOLN
INTRAMUSCULAR | Status: AC
  Filled 2022-06-14: qty 0.5

## 2022-06-14 MED ORDER — ALBUMIN HUMAN 5 % IV SOLN: INTRAVENOUS | Status: DC | PRN

## 2022-06-14 MED ORDER — LIDOCAINE 2% (20 MG/ML) 5 ML SYRINGE
INTRAMUSCULAR | Status: DC | PRN
  Administered 2022-06-14: 100 mg via INTRAVENOUS

## 2022-06-14 MED ORDER — LACTATED RINGERS IV SOLN: INTRAVENOUS | Status: DC

## 2022-06-14 MED ORDER — ALPRAZOLAM 0.5 MG PO TABS
1.0000 mg | ORAL_TABLET | Freq: Every day | ORAL | Status: DC
  Administered 2022-06-15 – 2022-06-16 (×3): 1 mg via ORAL
  Filled 2022-06-14 (×3): qty 2

## 2022-06-14 MED ORDER — ONDANSETRON HCL 4 MG/2ML IJ SOLN: 4.0000 mg | Freq: Four times a day (QID) | INTRAMUSCULAR | Status: DC | PRN

## 2022-06-14 MED ORDER — ROCURONIUM BROMIDE 10 MG/ML (PF) SYRINGE
PREFILLED_SYRINGE | INTRAVENOUS | Status: DC | PRN
  Administered 2022-06-14: 70 mg via INTRAVENOUS
  Administered 2022-06-14 (×2): 30 mg via INTRAVENOUS

## 2022-06-14 MED ORDER — HYDROMORPHONE HCL 1 MG/ML IJ SOLN
INTRAMUSCULAR | Status: DC | PRN
  Administered 2022-06-14: .5 mg via INTRAVENOUS

## 2022-06-14 MED ORDER — ACETAMINOPHEN 500 MG PO TABS
1000.0000 mg | ORAL_TABLET | Freq: Once | ORAL | Status: AC
  Administered 2022-06-14: 1000 mg via ORAL
  Filled 2022-06-14: qty 2

## 2022-06-14 MED ORDER — ATORVASTATIN CALCIUM 40 MG PO TABS
40.0000 mg | ORAL_TABLET | Freq: Every day | ORAL | Status: DC
  Administered 2022-06-15 – 2022-06-16 (×3): 40 mg via ORAL
  Filled 2022-06-14 (×3): qty 1

## 2022-06-14 MED ORDER — BUPIVACAINE LIPOSOME 1.3 % IJ SUSP
INTRAMUSCULAR | Status: AC
  Filled 2022-06-14: qty 20

## 2022-06-14 MED ORDER — KETOROLAC TROMETHAMINE 15 MG/ML IJ SOLN
INTRAMUSCULAR | Status: AC
  Administered 2022-06-16: 15 mg via INTRAVENOUS
  Filled 2022-06-14: qty 1

## 2022-06-14 MED ORDER — PANTOPRAZOLE SODIUM 40 MG PO TBEC
40.0000 mg | DELAYED_RELEASE_TABLET | Freq: Every day | ORAL | Status: DC
  Administered 2022-06-15 – 2022-06-17 (×3): 40 mg via ORAL
  Filled 2022-06-14 (×3): qty 1

## 2022-06-14 SURGICAL SUPPLY — 95 items
ADH SKN CLS APL DERMABOND .7 (GAUZE/BANDAGES/DRESSINGS) ×2
BAG SPEC RTRVL C1550 15 (MISCELLANEOUS) ×2
BLADE CLIPPER SURG (BLADE) IMPLANT
CANISTER SUCT 3000ML PPV (MISCELLANEOUS) ×4 IMPLANT
CANNULA REDUC XI 12-8 STAPL (CANNULA) ×4
CANNULA REDUCER 12-8 DVNC XI (CANNULA) ×4 IMPLANT
CNTNR URN SCR LID CUP LEK RST (MISCELLANEOUS) ×10 IMPLANT
CONN ST 1/4X3/8  BEN (MISCELLANEOUS) ×2
CONN ST 1/4X3/8 BEN (MISCELLANEOUS) IMPLANT
CONT SPEC 4OZ STRL OR WHT (MISCELLANEOUS) ×44
DEFOGGER SCOPE WARMER CLEARIFY (MISCELLANEOUS) ×2 IMPLANT
DERMABOND ADVANCED .7 DNX12 (GAUZE/BANDAGES/DRESSINGS) ×2 IMPLANT
DRAIN CHANNEL 28F RND 3/8 FF (WOUND CARE) IMPLANT
DRAIN CHANNEL 32F RND 10.7 FF (WOUND CARE) IMPLANT
DRAPE ARM DVNC X/XI (DISPOSABLE) ×8 IMPLANT
DRAPE COLUMN DVNC XI (DISPOSABLE) ×2 IMPLANT
DRAPE CV SPLIT W-CLR ANES SCRN (DRAPES) ×2 IMPLANT
DRAPE DA VINCI XI ARM (DISPOSABLE) ×8
DRAPE DA VINCI XI COLUMN (DISPOSABLE) ×2
DRAPE HALF SHEET 40X57 (DRAPES) ×2 IMPLANT
DRAPE INCISE IOBAN 66X45 STRL (DRAPES) IMPLANT
DRAPE ORTHO SPLIT 77X108 STRL (DRAPES) ×2
DRAPE SURG ORHT 6 SPLT 77X108 (DRAPES) ×2 IMPLANT
ELECT BLADE 6.5 EXT (BLADE) ×2 IMPLANT
ELECT REM PT RETURN 9FT ADLT (ELECTROSURGICAL) ×2
ELECTRODE REM PT RTRN 9FT ADLT (ELECTROSURGICAL) ×2 IMPLANT
GAUZE KITTNER 4X5 RF (MISCELLANEOUS) ×4 IMPLANT
GAUZE SPONGE 4X4 12PLY STRL (GAUZE/BANDAGES/DRESSINGS) ×2 IMPLANT
GLOVE SS BIOGEL STRL SZ 7.5 (GLOVE) ×4 IMPLANT
GLOVE SURG POLYISO LF SZ8 (GLOVE) ×2 IMPLANT
GOWN STRL REUS W/ TWL LRG LVL3 (GOWN DISPOSABLE) ×4 IMPLANT
GOWN STRL REUS W/ TWL XL LVL3 (GOWN DISPOSABLE) ×4 IMPLANT
GOWN STRL REUS W/TWL 2XL LVL3 (GOWN DISPOSABLE) ×2 IMPLANT
GOWN STRL REUS W/TWL LRG LVL3 (GOWN DISPOSABLE) ×4
GOWN STRL REUS W/TWL XL LVL3 (GOWN DISPOSABLE) ×4
HEMOSTAT SURGICEL 2X14 (HEMOSTASIS) ×6 IMPLANT
IRRIGATION STRYKERFLOW (MISCELLANEOUS) ×2 IMPLANT
IRRIGATOR STRYKERFLOW (MISCELLANEOUS) ×2
KIT BASIN OR (CUSTOM PROCEDURE TRAY) ×2 IMPLANT
KIT SUCTION CATH 14FR (SUCTIONS) IMPLANT
KIT TURNOVER KIT B (KITS) ×2 IMPLANT
NDL HYPO 25GX1X1/2 BEV (NEEDLE) ×2 IMPLANT
NDL SPNL 22GX3.5 QUINCKE BK (NEEDLE) ×2 IMPLANT
NEEDLE HYPO 25GX1X1/2 BEV (NEEDLE) ×2 IMPLANT
NEEDLE SPNL 22GX3.5 QUINCKE BK (NEEDLE) ×2 IMPLANT
NS IRRIG 1000ML POUR BTL (IV SOLUTION) ×4 IMPLANT
PACK CHEST (CUSTOM PROCEDURE TRAY) ×2 IMPLANT
PAD ARMBOARD 7.5X6 YLW CONV (MISCELLANEOUS) ×4 IMPLANT
PORT ACCESS TROCAR AIRSEAL 12 (TROCAR) ×2 IMPLANT
RELOAD STAPLE 45 2.5 WHT DVNC (STAPLE) IMPLANT
RELOAD STAPLE 45 3.5 BLU DVNC (STAPLE) IMPLANT
RELOAD STAPLE 45 4.3 GRN DVNC (STAPLE) IMPLANT
RELOAD STAPLER 2.5X45 WHT DVNC (STAPLE) ×4 IMPLANT
RELOAD STAPLER 3.5X45 BLU DVNC (STAPLE) ×12 IMPLANT
RELOAD STAPLER 4.3X45 GRN DVNC (STAPLE) ×2 IMPLANT
SCISSORS LAP 5X35 DISP (ENDOMECHANICALS) IMPLANT
SEAL CANN UNIV 5-8 DVNC XI (MISCELLANEOUS) ×4 IMPLANT
SEAL XI 5MM-8MM UNIVERSAL (MISCELLANEOUS) ×4
SET TRI-LUMEN FLTR TB AIRSEAL (TUBING) ×2 IMPLANT
SOLUTION ELECTROLUBE (MISCELLANEOUS) ×2 IMPLANT
SPONGE INTESTINAL PEANUT (DISPOSABLE) IMPLANT
SPONGE TONSIL 1 RF SGL (DISPOSABLE) IMPLANT
STAPLER 45 SUREFORM CVD (STAPLE) ×2
STAPLER 45 SUREFORM CVD DVNC (STAPLE) IMPLANT
STAPLER CANNULA SEAL DVNC XI (STAPLE) ×4 IMPLANT
STAPLER CANNULA SEAL XI (STAPLE) ×4
STAPLER RELOAD 2.5X45 WHITE (STAPLE) ×4
STAPLER RELOAD 2.5X45 WHT DVNC (STAPLE) ×4
STAPLER RELOAD 3.5X45 BLU DVNC (STAPLE) ×12
STAPLER RELOAD 3.5X45 BLUE (STAPLE) ×12
STAPLER RELOAD 4.3X45 GREEN (STAPLE) ×2
STAPLER RELOAD 4.3X45 GRN DVNC (STAPLE) ×2
SUT PDS AB 2-0 CT1 27 (SUTURE) IMPLANT
SUT PROLENE 4 0 RB 1 (SUTURE)
SUT PROLENE 4-0 RB1 .5 CRCL 36 (SUTURE) IMPLANT
SUT SILK  1 MH (SUTURE) ×2
SUT SILK 1 MH (SUTURE) ×2 IMPLANT
SUT SILK 2 0 SH (SUTURE) ×2 IMPLANT
SUT SILK 2 0SH CR/8 30 (SUTURE) IMPLANT
SUT SILK 3 0SH CR/8 30 (SUTURE) IMPLANT
SUT VIC AB 1 CTX 36 (SUTURE) ×2
SUT VIC AB 1 CTX36XBRD ANBCTR (SUTURE) ×2 IMPLANT
SUT VIC AB 2-0 CTX 36 (SUTURE) ×2 IMPLANT
SUT VIC AB 3-0 X1 27 (SUTURE) ×4 IMPLANT
SUT VICRYL 0 TIES 12 18 (SUTURE) ×2 IMPLANT
SUT VICRYL 0 UR6 27IN ABS (SUTURE) ×4 IMPLANT
SUT VICRYL 2 TP 1 (SUTURE) IMPLANT
SYR 20CC LL (SYRINGE) ×4 IMPLANT
SYSTEM RETRIEVAL ANCHOR 15 (MISCELLANEOUS) IMPLANT
SYSTEM SAHARA CHEST DRAIN ATS (WOUND CARE) ×2 IMPLANT
TAPE CLOTH 4X10 WHT NS (GAUZE/BANDAGES/DRESSINGS) ×2 IMPLANT
TAPE CLOTH SURG 4X10 WHT LF (GAUZE/BANDAGES/DRESSINGS) IMPLANT
TOWEL GREEN STERILE (TOWEL DISPOSABLE) ×2 IMPLANT
TRAY FOLEY MTR SLVR 16FR STAT (SET/KITS/TRAYS/PACK) ×2 IMPLANT
WATER STERILE IRR 1000ML POUR (IV SOLUTION) ×4 IMPLANT

## 2022-06-14 NOTE — Hospital Course (Addendum)
HPI: [name] is sent for consultation regarding squamous cell carcinoma of the right lower lobe.   [name] is a 67 year old woman with a past medical history significant for tobacco abuse, COPD, cervical cancer, hypertension, hypothyroidism, osteoporosis, arthritis, anxiety and depression.  She was experiencing pain in her right chest after leaning over the center console in her car.  Pain persisted so she went to urgent care chest x-ray apparently showed 2 broken ribs but also showed a right lower lobe cavitary lung mass.  CT showed a 3.8 x 2.4 x 2.3 cm cavitary mass in the right lower lobe.  She had a PET/CT which showed the mass was hypermetabolic.  There also was groundglass opacity in the left lower lobe that was mildly hypermetabolic, consistent with inflammatory disease.  She had a Nav bronc and EBUS by Dr. Valeta Harms.  The right lower lobe mass was a squamous cell carcinoma.  Level 7 and 11 R nodes were negative for tumor.  4R was not sampled.  MR of the brain showed no evidence of metastatic disease.   She has had a poor appetite and has lost about 18 pounds over the past 3 months.  She denies any chest pain, pressure, tightness, shortness of breath.  She thinks she could walk a mile without stopping.  No unusual headaches or visual changes.  She did have 3 episodes of syncope last fall.  She had a partial workup including an echocardiogram which showed preserved left ventricular function and no valvular pathology.  No definite etiology was ever determined.  She did go to the only taking her blood pressure medication as needed because her blood pressure was noted to be low.  Dr. Roxan Hockey reviewed the patient's diagnostic studies and determined surgical intervention would provide this patient the best long term treatment. He discussed the patient's treatment options as well as the risks and benefits of surgery. [name] was agreeable to proceed with surgery.  Hospital Course: [name] arrived at [facility] and was brought to the operating room on 06/14/22. She underwent robotic assisted right lower lobectomy and right upper lobe wedge resection. She tolerated the procedure well and was transferred to the PACU in stable condition. On POD1 she was noted to have an expected right apical space. Chest tube showed a small air leak with cough. Oxygen was weaned as able. The patient brought home inhalers to the hospital and was using them daily and as needed. She was hypertensive and started on home Lisinopril daily. Her creatinine elevated to 1.3 from 0.87, Toradol was discontinued. Her right apical space remained stable, chest tube was removed without complication. Follow up CXR showed stable right apical space. She had a small amount of air under the right sided diaphragm due to port placement, she had no abdominal issues. She was ambulating well. Incisions were healing well without signs of complication.

## 2022-06-14 NOTE — Anesthesia Procedure Notes (Signed)
Arterial Line Insertion Start/End3/07/2022 7:10 AM, 06/14/2022 7:15 AM Performed by: Griffin Dakin, CRNA, CRNA  Patient location: Pre-op. Preanesthetic checklist: patient identified, IV checked, site marked, risks and benefits discussed, surgical consent, monitors and equipment checked, pre-op evaluation, timeout performed and anesthesia consent Lidocaine 1% used for infiltration Left, radial was placed Catheter size: 20 G Hand hygiene performed  and maximum sterile barriers used   Attempts: 1 Procedure performed without using ultrasound guided technique. Following insertion, dressing applied and Biopatch. Post procedure assessment: normal and unchanged  Patient tolerated the procedure well with no immediate complications.

## 2022-06-14 NOTE — Brief Op Note (Signed)
06/14/2022  11:40 AM  PATIENT:  [name]  67 y.o. female  PRE-OPERATIVE DIAGNOSIS:  RIGHT LOWER LOBE SQUAMOUS CELL CARCINOMA  POST-OPERATIVE DIAGNOSIS:  RIGHT LOWER LOBE SQUAMOUS CELL CARCINOMA  PROCEDURE:  XI ROBOTIC ASSISTED THORACOSCOPY-RIGHT LOWER LOBECTOMY, RIGHT UPPER LOBE WEDGE RESECTION (Right) INTERCOSTAL NERVE BLOCK NODE DISSECTION (Right)  SURGEON:  Surgeon(s) and Role:    * Melrose Nakayama, MD - Primary  PHYSICIAN ASSISTANT: Wynelle Beckmann PA-C  ASSISTANTS: none   ANESTHESIA:   local and general  EBL:  150 mL   BLOOD ADMINISTERED:none  DRAINS:  right pleural tube    LOCAL MEDICATIONS USED:  OTHER Exparel  SPECIMEN:  Source of Specimen:  Right lower lobe, right upper lobe wedge, lymph nodes  DISPOSITION OF SPECIMEN:  PATHOLOGY  COUNTS CORRECT:  YES  DICTATION: .Dragon Dictation  PLAN OF CARE: Admit to inpatient   PATIENT DISPOSITION:  PACU - hemodynamically stable.   Delay start of Pharmacological VTE agent (>24hrs) due to surgical blood loss or risk of bleeding: no

## 2022-06-14 NOTE — Interval H&P Note (Signed)
History and Physical Interval Note:  06/14/2022 7:16 AM  [name]  has presented today for surgery, with the diagnosis of RLL SQUAMOUS CELL CARCINOMA.  The various methods of treatment have been discussed with the patient and family. After consideration of risks, benefits and other options for treatment, the patient has consented to  Procedure(s): XI ROBOTIC ASSISTED THORACOSCOPY-RIGHT LOWER LOBECTOMY, possible upper lobe wedge resection (Right) as a surgical intervention.  The patient's history has been reviewed, patient examined, no change in status, stable for surgery.  I have reviewed the patient's chart and labs.  Questions were answered to the patient's satisfaction.     Melrose Nakayama

## 2022-06-14 NOTE — Transfer of Care (Signed)
Immediate Anesthesia Transfer of Care Note  Patient: [name]  Procedure(s) Performed: XI ROBOTIC ASSISTED THORACOSCOPY-RIGHT LOWER LOBECTOMY, RIGHT UPPER LOBE WEDGE (Right: Chest) INTERCOSTAL NERVE BLOCK NODE DISSECTION (Right: Chest)  Patient Location: PACU  Anesthesia Type:General  Level of Consciousness: awake, alert , and oriented  Airway & Oxygen Therapy: Patient Spontanous Breathing and Patient connected to nasal cannula oxygen  Post-op Assessment: Report given to RN and Post -op Vital signs reviewed and stable  Post vital signs: Reviewed and stable  Last Vitals:  Vitals Value Taken Time  BP 168/93 06/14/22 1149  Temp    Pulse 79 06/14/22 1150  Resp 15 06/14/22 1150  SpO2 93 % 06/14/22 1150  Vitals shown include unvalidated device data.  Last Pain:  Vitals:   06/14/22 0617  TempSrc:   PainSc: 0-No pain         Complications: No notable events documented.

## 2022-06-14 NOTE — Anesthesia Procedure Notes (Signed)
Procedure Name: Intubation Date/Time: 06/14/2022 7:45 AM  Performed by: Griffin Dakin, CRNAPre-anesthesia Checklist: Patient identified, Emergency Drugs available, Suction available and Patient being monitored Patient Re-evaluated:Patient Re-evaluated prior to induction Oxygen Delivery Method: Circle system utilized Preoxygenation: Pre-oxygenation with 100% oxygen Induction Type: IV induction Ventilation: Mask ventilation without difficulty Grade View: Grade II Tube type: Oral Endobronchial tube: Double lumen EBT and Left and 37 Fr Number of attempts: 1 Airway Equipment and Method: Stylet and Oral airway Placement Confirmation: ETT inserted through vocal cords under direct vision, positive ETCO2 and breath sounds checked- equal and bilateral Secured at: 28 cm Tube secured with: Tape Dental Injury: Teeth and Oropharynx as per pre-operative assessment

## 2022-06-14 NOTE — Anesthesia Postprocedure Evaluation (Signed)
Anesthesia Post Note  Patient: [name]  Procedure(s) Performed: XI ROBOTIC ASSISTED THORACOSCOPY-RIGHT LOWER LOBECTOMY, RIGHT UPPER LOBE WEDGE (Right: Chest) INTERCOSTAL NERVE BLOCK NODE DISSECTION (Right: Chest)     Patient location during evaluation: PACU Anesthesia Type: General Level of consciousness: sedated Pain management: pain level controlled Vital Signs Assessment: post-procedure vital signs reviewed and stable Respiratory status: spontaneous breathing and respiratory function stable Cardiovascular status: stable Postop Assessment: no apparent nausea or vomiting Anesthetic complications: no  No notable events documented.  Last Vitals:  Vitals:   06/14/22 1210 06/14/22 1215  BP:  (!) 142/78  Pulse: 80 79  Resp: 18 15  Temp:    SpO2: 97% 98%    Last Pain:  Vitals:   06/14/22 1240  TempSrc:   PainSc: Asleep                 Cayson Kalb DANIEL

## 2022-06-15 ENCOUNTER — Encounter (HOSPITAL_COMMUNITY): Payer: Self-pay | Admitting: Thoracic Surgery (Cardiothoracic Vascular Surgery)

## 2022-06-15 ENCOUNTER — Inpatient Hospital Stay (HOSPITAL_COMMUNITY): Payer: PPO

## 2022-06-15 LAB — CBC
HCT: 31.9 % — ABNORMAL LOW (ref 36.0–46.0)
Hemoglobin: 10.3 g/dL — ABNORMAL LOW (ref 12.0–15.0)
MCH: 27.8 pg (ref 26.0–34.0)
MCHC: 32.3 g/dL (ref 30.0–36.0)
MCV: 86 fL (ref 80.0–100.0)
Platelets: 303 10*3/uL (ref 150–400)
RBC: 3.71 MIL/uL — ABNORMAL LOW (ref 3.87–5.11)
RDW: 13.2 % (ref 11.5–15.5)
WBC: 15.9 10*3/uL — ABNORMAL HIGH (ref 4.0–10.5)
nRBC: 0 % (ref 0.0–0.2)

## 2022-06-15 LAB — BASIC METABOLIC PANEL
Anion gap: 8 (ref 5–15)
BUN: 8 mg/dL (ref 8–23)
CO2: 26 mmol/L (ref 22–32)
Calcium: 8.3 mg/dL — ABNORMAL LOW (ref 8.9–10.3)
Chloride: 100 mmol/L (ref 98–111)
Creatinine, Ser: 0.87 mg/dL (ref 0.44–1.00)
GFR, Estimated: >60 mL/min (ref >60)
Glucose, Bld: 134 mg/dL — ABNORMAL HIGH (ref 70–99)
Potassium: 3.8 mmol/L (ref 3.5–5.1)
Sodium: 134 mmol/L — ABNORMAL LOW (ref 135–145)

## 2022-06-15 LAB — SURGICAL PATHOLOGY

## 2022-06-15 MED ORDER — LISINOPRIL 10 MG PO TABS
10.0000 mg | ORAL_TABLET | Freq: Every day | ORAL | Status: DC
  Administered 2022-06-16: 10 mg via ORAL
  Filled 2022-06-15 (×3): qty 1

## 2022-06-15 NOTE — Plan of Care (Signed)

## 2022-06-15 NOTE — Discharge Summary (Addendum)
Physician Discharge Summary  Patient ID: [name] MRN: [id number] DOB/AGE: 67-Oct-1957 67 y.o.  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 is sent for consultation regarding squamous cell carcinoma of the right lower lobe.   [name] is a 67 year old woman with a past medical history significant for tobacco abuse, COPD, cervical cancer, hypertension, hypothyroidism, osteoporosis, arthritis, anxiety and depression.  She was experiencing pain in her right chest after leaning over the center console in her car.  Pain persisted so she went to urgent care chest x-ray apparently showed 2 broken ribs but also showed a right lower lobe cavitary lung mass.  CT showed a 3.8 x 2.4 x 2.3 cm cavitary mass in the right lower lobe.  She had a PET/CT which showed the mass was hypermetabolic.  There also was groundglass opacity in the left lower lobe that was mildly hypermetabolic, consistent with inflammatory disease.  She had a Nav bronc and EBUS by Dr. Valeta Harms.  The right lower lobe mass was a squamous cell carcinoma.  Level 7 and 11 R nodes were negative for tumor.  4R was not sampled.  MR of the brain showed no evidence of metastatic disease.   She has had a poor appetite and has lost about 18 pounds over the past 3 months.  She denies any chest pain, pressure, tightness, shortness of breath.  She thinks she could walk a mile without stopping.  No unusual headaches or visual changes.  She did have 3 episodes of syncope last fall.  She had a partial workup including an echocardiogram which showed preserved left ventricular function and no valvular pathology.  No definite etiology was ever determined.  She did go to the only taking her blood  pressure medication as needed because her blood pressure was noted to be low.  Dr. Roxan Hockey reviewed the patient's diagnostic studies and determined surgical intervention would provide this patient the best long term treatment. He discussed the patient's treatment options as well as the risks and benefits of surgery. [name] was agreeable to proceed with surgery.  Hospital Course: [name] arrived at [facility] and was brought to the operating room on 06/14/22. She underwent robotic assisted right lower lobectomy and right upper lobe wedge resection. She tolerated the procedure well and was transferred to the PACU in stable condition. On POD1 she was noted to have an expected right apical space. Chest tube showed a small air leak with cough. The patient brought her inhalers from home and was using them daily and as needed. She remained on [place] oxygen, pulmonary hygiene was encouraged. She was hypertensive and started on home Lisinopril daily. Her creatinine elevated to 1.3 from 0.87, Toradol was discontinued. Her right apical space remained stable, chest tube was removed without complication on POD2. Follow up same day and next day chest xrays showed a stable right apical space. Her oxygen was weaned as able. She had a small amount of air under the right sided diaphragm due to port placement, it was improving and she had no abdominal issues. She was ambulating well. Incisions were healing well without signs of complication. She was saturating well on 1L of nasal cannula oxygen, at rest on room air her oxygen saturations remained in the  low 90s, and during ambulation on room air her oxygen saturation would drop to 85%. She was felt to require temporary home oxygen which was arranged accordingly. She was felt surgically stable for discharge home.   Consults: None  Significant Diagnostic Studies:  CLINICAL DATA:  Cough.  Lung mass.   EXAM: CT CHEST WITHOUT CONTRAST    TECHNIQUE: Multidetector CT imaging of the chest was performed following the standard protocol without IV contrast.   RADIATION DOSE REDUCTION: This exam was performed according to the departmental dose-optimization program which includes automated exposure control, adjustment of the mA and/or kV according to patient size and/or use of iterative reconstruction technique.   COMPARISON:  Chest radiograph 04/17/2022 and chest CT   FINDINGS: Cardiovascular: Normal heart size. Aortic atherosclerosis and coronary artery calcifications. No pericardial effusion.   Mediastinum/Nodes: Thyroid gland, trachea and esophagus are unremarkable. No enlarged axillary or mediastinal lymph nodes. Hilar lymph nodes are suboptimally evaluated due to lack of IV contrast.   Lungs/Pleura: Mild centrilobular and paraseptal emphysema. No pleural fluid or airspace disease.   -Subsegmental atelectasis versus scar noted within the anterior left lower lobe, image 114/4.   -Within the anterior right lower lobe, abutting the major fissure, there is a cavitary mass measuring 3.8 by 2.4 by 2.9 cm, image 94/4. The wall thickness of this mass measures up to 6 mm.   -subpleural nodule in the medial right upper lobe measures 4 mm, image 32/4.   -Calcified granuloma identified within the right middle lobe.   Upper Abdomen: No acute abnormality. Right adrenal gland adenoma measures 1.5 cm and -10 Hounsfield units, image 138/3. No follow-up imaging recommended.   Musculoskeletal: No chest wall mass or suspicious bone lesions identified. Subacute fracture deformities are noted involving the anterior aspect of the right eighth and ninth ribs, image 135/4.   IMPRESSION: 1. There is a 3.8 cm cavitary mass within the anterior right lower lobe, abutting the major fissure. Differential considerations include malignancy (such as squamous cell carcinoma) versus sequelae of inflammation or infection. In the absence of  signs/symptoms of infection referral to pulmonary medicine or thoracic surgery is advised for further management. 2. 4 mm subpleural nodule in the medial right upper lobe is nonspecific. Attention on follow-up imaging is advised. 3. Subacute fracture deformities involving the anterior aspect of the right eighth and ninth ribs. 4. Coronary artery calcifications. 5. Aortic Atherosclerosis (ICD10-I70.0) and Emphysema (ICD10-J43.9).   These results will be called to the ordering clinician or representative by the Radiologist Assistant, and communication documented in the PACS or [employer].     Electronically Signed   By: Kerby Moors M.D.   On: 04/26/2022 09:38  Treatments: surgery:   Operative Report   DATE OF PROCEDURE: 06/14/2022   PREOPERATIVE DIAGNOSIS:  Squamous cell carcinoma, right lower lobe, clinical stage I B (T2a N0)   POSTOPERATIVE DIAGNOSIS:  Squamous cell carcinoma, right lower lobe, clinical stage I B (T2a N0).   PROCEDURE:  Robotic-assisted right lower lobectomy, lymph node dissection and intercostal nerve blocks levels 3 through 10.   SURGEON:  Revonda Standard. Roxan Hockey, MD  PATHOLOGY: SURGICAL PATHOLOGY CASE: [id number] PATIENT: [name] Surgical Pathology Report  Clinical History: right lower lobe squamous cell carcinoma (cm)  ONCOLOGY TABLE: LUNG: Resection Synchronous Tumors: Not applicable Total Number of Primary Tumors: 1 Procedure: Right upper lobectomy and lymph node biopsies Specimen Laterality: Right upper lobe Tumor Focality: Unifocal Tumor Site: Right upper lobe Tumor Size: 4.3 x 3.9 x 2.8 Histologic Type: Squamous  cell carcinoma Visceral Pleura Invasion: Not identified Direct Invasion of Adjacent Structures: Not identified Lymphovascular Invasion: Not identified Margins: All margins negative for invasive carcinoma      Closest Margin(s) to Invasive Carcinoma: 1.5 cm from bronchial margin Treatment Effect: No known  presurgical therapy Regional Lymph Nodes:      Number of Lymph Nodes Involved: 0                           Nodal Sites with Tumor: 0      Number of Lymph Nodes Examined: 18                      Nodal Sites Examined: 4, 7, 8, 9, 10, 11, 12 Distant Metastasis:      Distant Site(s) Involved: Not applicable Pathologic Stage Classification (pTNM, AJCC 8th Edition): pT2b, pN0 Ancillary Studies: Can be performed if requested Representative Tumor Block: C2-C5 (v4.2.0.1)  INTRAOPERATIVE DIAGNOSIS: A1, A2. LYMPH NODE, LEVEL 7, FROZEN SECTION:        One benign lymph node.  Negative for carcinoma.       Rapid intraoperative consult diagnosis rendered by Dr. Chauncey Cruel @ [phone] 06/14/2022.  B1. LYMPH NODE, LEVEL 11 #3, FROZEN SECTION:        One benign lymph node.  Negative for carcinoma.       Rapid intraoperative consult diagnosis rendered by Dr. Chauncey Cruel @ [phone] 06/14/2022.  C1. LUNG, RIGHT LOWER LOBE BRONCHIAL MARGIN, FROZEN SECTION:        Negative for carcinoma.       Rapid intraoperative consult diagnosis rendered by Dr. Chauncey Cruel @ [phone] 06/14/2022.  Final Diagnosis performed by Claudette Laws, MD.   Electronically signed 06/15/2022   Discharge Exam: Blood pressure 129/74, pulse 79, temperature 98.2 F (36.8 C), temperature source Oral, resp. rate (!) 23, height 5\' 5"  (1.651 m), weight 56.7 kg, SpO2 94 %. General appearance: alert, cooperative, and no distress Neurologic: intact Heart: regular rate and rhythm, S1, S2 normal, no murmur, click, rub or gallop Lungs: Wheezes throughout, diminished at the bases Abdomen: soft, non-tender; bowel sounds normal; no masses,  no organomegaly Extremities: extremities normal, atraumatic, no cyanosis or edema Wound: Clean and dry without erythema or sign of infection. Clean and dry dressing over chest tube incision.   Disposition: Discharge disposition: 01-Home or Self Care        Allergies as of 06/17/2022       Reactions   Zithromax  [azithromycin] Rash, Other (See Comments)   Blisters in mouth    Prednisone Nausea Only   Sick to stomach Can take in low dose        Medication List     TAKE these medications    acetaminophen 325 MG tablet Commonly known as: TYLENOL Take 650 mg by mouth every 6 (six) hours as needed for headache.   albuterol 108 (90 Base) MCG/ACT inhaler Commonly known as: VENTOLIN HFA INHALE 2 PUFFS INTO THE LUNGS EVERY 6 HOURS AS NEEDED FOR WHEEZING OR SHORTNESS OF BREATH.   alendronate 70 MG tablet Commonly known as: FOSAMAX Take 70 mg by mouth once a week.   ALPRAZolam 1 MG tablet Commonly known as: XANAX Take 1 mg by mouth at bedtime. May take additional 1 mg during the day if needed   atorvastatin 40 MG tablet Commonly known as: LIPITOR Take 40 mg by mouth at bedtime.   benzonatate 100 MG capsule Commonly known as: TESSALON Take  100 mg by mouth 3 (three) times daily as needed for cough.   FLUoxetine 40 MG capsule Commonly known as: PROZAC Take 40 mg by mouth daily.   hydrochlorothiazide 25 MG tablet Commonly known as: HYDRODIURIL Take 1 tablet (25 mg total) by mouth daily. What changed:  when to take this reasons to take this   lisinopril 10 MG tablet Commonly known as: ZESTRIL Take 10 mg by mouth daily as needed (blood pressure of 170/99).   oxyCODONE 5 MG immediate release tablet Commonly known as: Oxy IR/ROXICODONE Take 1 tablet (5 mg total) by mouth every 6 (six) hours as needed for moderate pain.   traZODone 100 MG tablet Commonly known as: DESYREL Take 100 mg by mouth at bedtime.   Trelegy Ellipta 100-62.5-25 MCG/ACT Aepb Generic drug: Fluticasone-Umeclidin-Vilant Inhale 1 each into the lungs daily.               Durable Medical Equipment  (From admission, onward)           Start     Ordered   06/17/22 0701  For home use only DME oxygen  Once       Question Answer Comment  Length of Need 6 Months   Liters per Minute 2   Oxygen delivery  system Gas      06/17/22 0700            Follow-up Information     Melrose Nakayama, MD Follow up on 06/28/2022.   Specialty: Cardiothoracic Surgery Why: Follow up appointment is at 4:30PM Contact information: [place] [address] [phone]         [facility] Follow up on 06/28/2022.   Why: To get chest xray at 3:30PM Contact information: [place]                Signed: Magdalene River, PA-C  06/17/2022, 2:33 PM

## 2022-06-15 NOTE — Progress Notes (Cosign Needed)
  Transition of Care [facility]) Screening Note   Patient Details  Name: LEILI KAYLOR Date of Birth: 11-12-1955   Transition of Care [facility]) CM/SW Contact:    Cyndi Bender, RN Phone Number: 06/15/2022, 8:49 AM    Transition of Care Department [facility]) has reviewed patient and no TOC needs have been identified at this time. We will continue to monitor patient advancement through interdisciplinary progression rounds. If new patient transition needs arise, please place a TOC consult.

## 2022-06-15 NOTE — Plan of Care (Signed)
  Problem: Education: Goal: Knowledge of disease or condition will improve Outcome: Progressing Goal: Knowledge of the prescribed therapeutic regimen will improve Outcome: Progressing   Problem: Activity: Goal: Risk for activity intolerance will decrease Outcome: Progressing   Problem: Cardiac: Goal: Will achieve and/or maintain hemodynamic stability Outcome: Progressing   Problem: Clinical Measurements: Goal: Postoperative complications will be avoided or minimized Outcome: Progressing   Problem: Respiratory: Goal: Respiratory status will improve Outcome: Progressing   Problem: Pain Management: Goal: Pain level will decrease Outcome: Progressing   Problem: Education: Goal: Knowledge of General Education information will improve Description: Including pain rating scale, medication(s)/side effects and non-pharmacologic comfort measures Outcome: Progressing   Problem: Health Behavior/Discharge Planning: Goal: Ability to manage health-related needs will improve Outcome: Progressing   Problem: Clinical Measurements: Goal: Ability to maintain clinical measurements within normal limits will improve Outcome: Progressing Goal: Will remain free from infection Outcome: Progressing Goal: Diagnostic test results will improve Outcome: Progressing Goal: Respiratory complications will improve Outcome: Progressing Goal: Cardiovascular complication will be avoided Outcome: Progressing

## 2022-06-15 NOTE — Discharge Instructions (Signed)
Robot-Assisted Thoracic Surgery, Care After The following information offers guidance on how to care for yourself after your procedure. Your health care provider may also give you more specific instructions. If you have problems or questions, contact your health care provider. What can I expect after the procedure? After the procedure, it is common to have: Some pain and aches in the area of your surgical incisions. Pain when breathing in (inhaling) and coughing. Tiredness (fatigue). Trouble sleeping. Constipation. Follow these instructions at home: Medicines Take over-the-counter and prescription medicines only as told by your health care provider. If you were prescribed an antibiotic medicine, take it as told by your health care provider. Do not stop taking the antibiotic even if you start to feel better. Talk with your health care provider about safe and effective ways to manage pain after your procedure. Pain management should fit your specific health needs. Take pain medicine before pain becomes severe. Relieving and controlling your pain will make breathing easier for you. Ask your health care provider if the medicine prescribed to you requires you to avoid driving or using machinery. Eating and drinking Follow instructions from your health care provider about eating or drinking restrictions. These will vary depending on what procedure you had. Your health care provider may recommend: A liquid diet or soft diet for the first few days. Meals that are smaller and more frequent. A diet of fruits, vegetables, whole grains, and low-fat proteins. Limiting foods that are high in fat and processed sugar, including fried or sweet foods. Incision care Follow instructions from your health care provider about how to take care of your incisions. Make sure you: Wash your hands with soap and water for at least 20 seconds before and after you change your bandage (dressing). If soap and water are not  available, use hand sanitizer. Change your dressing as told by your health care provider. Leave stitches (sutures), skin glue, or adhesive strips in place. These skin closures may need to stay in place for 2 weeks or longer. If adhesive strip edges start to loosen and curl up, you may trim the loose edges. Do not remove adhesive strips completely unless your health care provider tells you to do that. Check your incision area every day for signs of infection. Check for: Redness, swelling, or more pain. Fluid or blood. Warmth. Pus or a bad smell. Activity Return to your normal activities as told by your health care provider. Ask your health care provider what activities are safe for you. Ask your health care provider when it is safe for you to drive. Do not lift anything that is heavier than 10 lb (4.5 kg), or the limit that you are told, until your health care provider says that it is safe. Rest as told by your health care provider. Avoid sitting for a long time without moving. Get up to take short walks every 1-2 hours. This is important to improve blood flow and breathing. Ask for help if you feel weak or unsteady. Do exercises as told by your health care provider. Pneumonia prevention  Do deep breathing exercises and cough regularly as directed. This helps clear mucus and opens your lungs. Doing this helps prevent lung infection (pneumonia). If you were given an incentive spirometer, use it as told. An incentive spirometer is a tool that measures how well you are filling your lungs with each breath. Coughing may hurt less if you try to support your chest. This is called splinting. Try one of these when you  is called splinting. Try one of these when you cough: Hold a pillow against your chest. Place the palms of both hands on top of your incision area. Do not use any products that contain nicotine or tobacco. These products include cigarettes, chewing tobacco, and vaping devices, such as  e-cigarettes. If you need help quitting, ask your health care provider. Avoid secondhand smoke. General instructions If you have a drainage tube: Follow instructions from your health care provider about how to take care of it. Do not travel by airplane after your tube is removed until your health care provider tells you it is safe. You may need to take these actions to prevent or treat constipation: Drink enough fluid to keep your urine pale yellow. Take over-the-counter or prescription medicines. Eat foods that are high in fiber, such as beans, whole grains, and fresh fruits and vegetables. Limit foods that are high in fat and processed sugars, such as fried or sweet foods. Keep all follow-up visits. This is important. Contact a health care provider if: You have redness, swelling, or more pain around an incision. You have fluid or blood coming from an incision. An incision feels warm to the touch. You have pus or a bad smell coming from an incision. You have a fever. You cannot eat or drink without vomiting. Your pain medicine is not controlling your pain. Get help right away if: You have chest pain. Your heart is beating quickly. You have trouble breathing. You have trouble speaking. You are confused. You feel weak or dizzy, or you faint. These symptoms may represent a serious problem that is an emergency. Do not wait to see if the symptoms will go away. Get medical help right away. Call your local emergency services (911 in the [place] Do not drive yourself to the hospital. Summary Talk with your health care provider about safe and effective ways to manage pain after your procedure. Pain management should fit your specific health needs. Return to your normal activities as told by your health care provider. Ask your health care provider what activities are safe for you. Do deep breathing exercises and cough regularly as directed. This helps to clear mucus and prevent pneumonia. If it  hurts to cough, ease pain by holding a pillow against your chest or by placing the palms of both hands over your incisions. This information is not intended to replace advice given to you by your health care provider. Make sure you discuss any questions you have with your health care provider. Document Revised: 12/21/2019 Document Reviewed: 12/21/2019 Elsevier Patient Education  2023 Elsevier Inc.     If any questions or concerns arise, please do not hesitate to contact our office at [phone]  

## 2022-06-15 NOTE — Progress Notes (Addendum)
[address]       [place], [address]             [phone]      1 Day Post-Op Procedure(s) (LRB): XI ROBOTIC ASSISTED THORACOSCOPY-RIGHT LOWER LOBECTOMY, RIGHT UPPER LOBE WEDGE (Right) INTERCOSTAL NERVE BLOCK NODE DISSECTION (Right) Subjective: The patient states she has some pain around her right side but it is not as bad as she expected it to be. She states she thinks she is doing pretty good.   Objective: Vital signs in last 24 hours: Temp:  [97.2 F (36.2 C)-98.2 F (36.8 C)] 97.7 F (36.5 C) (03/05 0500) Pulse Rate:  [77-80] 79 (03/04 1637) Cardiac Rhythm: Normal sinus rhythm (03/04 1908) Resp:  [15-20] 20 (03/05 0500) BP: (118-167)/(66-97) 157/66 (03/05 0500) SpO2:  [92 %-98 %] 95 % (03/05 0500) Arterial Line BP: (191-214)/(80-88) 191/80 (03/04 1215)  Hemodynamic parameters for last 24 hours:    Intake/Output from previous day: 03/04 0701 - 03/05 0700 In: 3329 [P.O.:240; I.V.:2739; IV Piggyback:350] Out: [id number] [Urine:4375; Blood:150; Chest Tube:220] Intake/Output this shift: Total I/O In: 1079 [P.O.:240; I.V.:739; IV Piggyback:100] Out: 2973 [Urine:2925; Chest Tube:48]  General appearance: alert, cooperative, and no distress Neurologic: intact Heart: regular rate and rhythm, S1, S2 normal, no murmur, click, rub or gallop Lungs: Some wheezing and crackles throughout Abdomen: soft, non-tender; bowel sounds normal; no masses,  no organomegaly Extremities: extremities normal, atraumatic, no cyanosis or edema Wound: Some serosanguinous drainage, clan dressing around chest tube this AM, other incisions clean and dry without erythema or sign of infection  Lab Results: Recent Labs    06/14/22 0828 06/15/22 0025  WBC  --  15.9*  HGB 9.9* 10.3*  HCT 29.0* 31.9*  PLT  --  303   BMET:  Recent Labs    06/14/22 0828 06/15/22 0025  NA 139 134*  K 3.3* 3.8  CL  --  100  CO2  --  26  GLUCOSE  --  134*  BUN  --  8  CREATININE  --  0.87   CALCIUM  --  8.3*    PT/INR: No results for input(s): "LABPROT", "INR" in the last 72 hours. ABG    Component Value Date/Time   PHART 7.431 06/14/2022 0828   HCO3 22.2 06/14/2022 0828   TCO2 23 06/14/2022 0828   ACIDBASEDEF 2.0 06/14/2022 0828   O2SAT 99 06/14/2022 0828   CBG (last 3)  No results for input(s): "GLUCAP" in the last 72 hours.  Assessment/Plan: S/P Procedure(s) (LRB): XI ROBOTIC ASSISTED THORACOSCOPY-RIGHT LOWER LOBECTOMY, RIGHT UPPER LOBE WEDGE (Right) INTERCOSTAL NERVE BLOCK NODE DISSECTION (Right)  Neuro: Pain relatively well controlled.   CV: NSR, HR 70s-80s. SBP 157. Hx of hypertension, on Lisinopril '10mg'$  daily PRN, will start Lisinopril '10mg'$  daily.  Pulm: Saturating 95% on 3L . CT to water seal. CT output 220cc/24hrs. CXR with no definitive pneumothorax, possible right apical space pending final read. Some tidaling, no clear air leak with cough. Continue IS and ambulation.  GI: CXR shows some air under diaphragm on the right side. No abdominal pain, nausea or vomiting. Good appetite.  Renal: Cr 0.87, UO 4375cc/24 hours. D/c foley catheter  Hyponatremia: Na 134, monitor  Hypokalemia: K 3.8, improved from 3.3 yesterday  ID: Leukocytosis, WBC 15.9. Afebrile  Expected postop ABLA: H/H 10.3/31.9, monitor  DVT Prophylaxis: Lovenox '40mg'$  daily  Dispo: Possibly d/c chest tube today.   LOS: 1 day    Magdalene River, PA-C 06/15/2022  Patient seen and examined, agree with above CXR looks good with small apical space Small air leak when I examined her- keep CT to water seal Ambulate  Remo Lipps C. Roxan Hockey, MD [facility] [phone]

## 2022-06-15 NOTE — Op Note (Signed)
NAMETATIONA, STECH MEDICAL RECORD NO: [id number] ACCOUNT NO: [id number] DATE OF BIRTH: February 23, 1956 FACILITY: [facility] LOCATION: [facility] PHYSICIAN: Revonda Standard. Roxan Hockey, MD  Operative Report   DATE OF PROCEDURE: 06/14/2022  PREOPERATIVE DIAGNOSIS:  Squamous cell carcinoma, right lower lobe, clinical stage IB (T2a N0)  POSTOPERATIVE DIAGNOSIS:  Squamous cell carcinoma, right lower lobe, clinical stage IB (T2a N0).  PROCEDURE:   Robotic-assisted right lower lobectomy,  Lymph node dissection and  Intercostal nerve blocks levels 3 through 10.  SURGEON:  Revonda Standard. Roxan Hockey, MD  ASSISTANT:  Wynelle Beckmann, PA  ANESTHESIA:  General.  FINDINGS:  Mass in right lower lobe.  Multiple enlarged lymph nodes.  Frozen section of nodes were negative for tumor.  Bronchial margin negative for tumor.  CLINICAL NOTE:  [name] is a 68 year old woman who was recently found to have a cavitary right lower lobe lung mass.  Biopsy showed squamous cell carcinoma.  She had some enlarged lymph nodes on CT, but aspirations of level 7 and 11R nodes were  negative.  She was offered surgical resection.  The indications, risks, benefits, and alternatives were discussed in detail with the patient.  She understood and accepted the risks and agreed to proceed.  OPERATIVE NOTE:  The patient was brought to the preoperative holding area on 06/14/2022.  The anesthesia service placed an arterial blood pressure monitoring line and established intravenous access.  She was taken to the operating room and anesthetized  and intubated.  A Foley catheter was placed.  Sequential compression devices were placed on the calves for DVT prophylaxis.  Intravenous antibiotics were administered.  She was placed in a left lateral decubitus position.  A Bair Hugger was placed for  active warming.  The right chest was prepped and draped in the usual sterile fashion.  Single lung ventilation of the left lung was initiated and was  tolerated well throughout the procedure.  A timeout was performed.  A solution containing 20 mL of liposomal bupivacaine, 30 mL of 0.5% bupivacaine and 50 mL of saline was prepared.  This solution was used for the intercostal nerve blocks and local at the incision sites.  An incision was made in the eighth interspace in the midaxillary line, and an 8 mm port was inserted.  Thoracoscope was advanced into the chest.  After confirming intrapleural placement, carbon dioxide was insufflated per protocol.  A 12 mm port was placed in the eighth interspace anteriorly and then a 12 mm AirSeal port was placed in the tenth interspace anterolaterally.  Intercostal nerve blocks then were performed from the third to the tenth interspace injecting 10 mL of the bupivacaine solution into a subpleural plane at each level.  Two additional robotic ports were placed, the robot was deployed.  The camera arm was docked, targeting was performed.  The remaining arms were docked.  The robotic instruments were inserted with thoracoscopic visualization.  Dissection was begun along the inferior pulmonary ligament.  Bipolar cautery was used. Lymph nodes that were encountered were removed and sent as separate specimens for permanent pathology with the exception of 2 nodes that were sent for frozen section. The level 9 node was removed.  The pleural reflection was divided at the hilum posteriorly and level 8 node was removed.  There was a markedly enlarged level 7 node which was removed.  It was sent for frozen section, which subsequently returned with no tumor seen.  The level 11 node was dissected out at the bifurcation of the upper lobe bronchus  from the bronchus intermedius.  Pleural reflection was divided at the hilum superiorly and level 10 node was removed and then the pleura was incised superior  to the azygos vein and level 4R nodes were removed.  Again, these nodes were enlarged, but otherwise, benign in appearance.  The pleural  reflection was divided at the hilum anteriorly, the phrenic nerve was clearly identified.  No cautery was used in its  vicinity.  Attention then was turned to the fissure and the fissure was relatively well defined.  The pulmonary artery was identified and a plane was developed superficial to it.  The fissure was completed, working anteriorly and posteriorly with  sequential firings of the robotic stapler.  The pulmonary artery branches, then were encircled. These were dissected out by removing the adjacent level 12 lymph nodes.  They were encircled and divided with the robotic stapler.  The inferior pulmonary  vein was likewise divided with the robotic stapler.  Stapler then was placed across the right lower lobe bronchus at its origin and closed.  A test inflation showed good aeration of the upper and middle lobes.  The stapler was fired transecting the bronchus.  The vessel loop was removed.  The chest was copiously irrigated with saline.  A test inflation to 30 cm of water pressure revealed no significant air leak.  The sponges that had been placed during the dissection were removed and the lower lobe was pushed upward to the apex.  The robotic instruments were removed.  The robot was undocked. The eighth interspace incision anteriorly was lengthened to approximately 4 cm.  A 15 mm endoscopic retrieval bag was placed into the chest and the lower lobe was  manipulated into the bag, which was then removed without difficulty.  It was sent for frozen section of the bronchial margin, was subsequently returned with no tumor seen. Inspection was made for hemostasis at the port sites as well as the staple lines.   A 28-French Blake drain was placed through the original port incision and secured with #1 silk suture.  Dual lung ventilation was resumed.  The remaining incisions were closed in standard fashion.  Dermabond was applied to the skin incisions.  The chest tube was placed to a Pleur-Evac on waterseal.  Dual  lung ventilation was resumed after placement of the chest tube. The patient was placed in a supine position.  She was extubated in the operating room and taken to the postanesthetic care unit in good  condition.  All sponge, needle and instrument counts were correct at the end of the procedure.  Experienced assistance was necessary for this case due to surgical complexity.  Wynelle Beckmann served as the Environmental consultant providing assistance with port placement, robot docking and undocking, instrument exchange, specimen retrieval and suctioning and wound closure.   MUK D: 06/15/2022 6:14:59 pm T: 06/15/2022 8:44:00 pm  JOB: [id number]/ [id number]

## 2022-06-16 ENCOUNTER — Inpatient Hospital Stay (HOSPITAL_COMMUNITY): Payer: PPO

## 2022-06-16 LAB — COMPREHENSIVE METABOLIC PANEL
ALT: 9 U/L (ref 0–44)
AST: 19 U/L (ref 15–41)
Albumin: 2.5 g/dL — ABNORMAL LOW (ref 3.5–5.0)
Alkaline Phosphatase: 67 U/L (ref 38–126)
Anion gap: 4 — ABNORMAL LOW (ref 5–15)
BUN: 13 mg/dL (ref 8–23)
CO2: 29 mmol/L (ref 22–32)
Calcium: 7.9 mg/dL — ABNORMAL LOW (ref 8.9–10.3)
Chloride: 102 mmol/L (ref 98–111)
Creatinine, Ser: 1.31 mg/dL — ABNORMAL HIGH (ref 0.44–1.00)
GFR, Estimated: 45 mL/min — ABNORMAL LOW (ref >60)
Glucose, Bld: 130 mg/dL — ABNORMAL HIGH (ref 70–99)
Potassium: 3.4 mmol/L — ABNORMAL LOW (ref 3.5–5.1)
Sodium: 135 mmol/L (ref 135–145)
Total Bilirubin: 0.2 mg/dL — ABNORMAL LOW (ref 0.3–1.2)
Total Protein: 5 g/dL — ABNORMAL LOW (ref 6.5–8.1)

## 2022-06-16 LAB — CBC
HCT: 31.2 % — ABNORMAL LOW (ref 36.0–46.0)
Hemoglobin: 9.9 g/dL — ABNORMAL LOW (ref 12.0–15.0)
MCH: 27.7 pg (ref 26.0–34.0)
MCHC: 31.7 g/dL (ref 30.0–36.0)
MCV: 87.2 fL (ref 80.0–100.0)
Platelets: 284 10*3/uL (ref 150–400)
RBC: 3.58 MIL/uL — ABNORMAL LOW (ref 3.87–5.11)
RDW: 13.4 % (ref 11.5–15.5)
WBC: 13.5 10*3/uL — ABNORMAL HIGH (ref 4.0–10.5)
nRBC: 0 % (ref 0.0–0.2)

## 2022-06-16 MED ORDER — POTASSIUM CHLORIDE CRYS ER 20 MEQ PO TBCR
20.0000 meq | EXTENDED_RELEASE_TABLET | Freq: Once | ORAL | Status: AC
  Administered 2022-06-16: 20 meq via ORAL
  Filled 2022-06-16: qty 1

## 2022-06-16 NOTE — Progress Notes (Signed)
Mobility Specialist Progress Note:   06/16/22 1058  Mobility  Activity Ambulated with assistance in hallway  Level of Assistance Standby assist, set-up cues, supervision of patient - no hands on  Assistive Device Front wheel walker  Distance Ambulated (ft) 260 ft  Activity Response Tolerated well  $Mobility charge 1 Mobility   Pt in chair willing to participate in mobility. No complaints of pain. Left in bed with call bell in reach and all needs met.   Gareth Eagle Sandara Tyree Mobility Specialist Please contact via [facility] [employer] or  Rehab Office at [phone]

## 2022-06-16 NOTE — Care Management Important Message (Signed)
Important Message  Patient Details  Name: [name] MRN: [id number] Date of Birth: November 06, 1955   Medicare Important Message Given:  Yes     Orbie Pyo 06/16/2022, 2:26 PM

## 2022-06-16 NOTE — Progress Notes (Signed)
Mobility Specialist Progress Note:   06/16/22 1554  Mobility  Activity Ambulated with assistance in hallway  Level of Assistance Standby assist, set-up cues, supervision of patient - no hands on  Assistive Device None  Distance Ambulated (ft) 220 ft  Activity Response Tolerated well  $Mobility charge 1 Mobility   Pt in bed willing to participate in mobility. No complaints of pain. Left in bed with call bell in reach and all needs met.   Gareth Eagle Azeez Dunker Mobility Specialist Please contact via [employer] or  Rehab Office at [phone]

## 2022-06-16 NOTE — Progress Notes (Addendum)
[address]       [place] [address]             [phone]      2 Days Post-Op Procedure(s) (LRB): XI ROBOTIC ASSISTED THORACOSCOPY-RIGHT LOWER LOBECTOMY, RIGHT UPPER LOBE WEDGE (Right) INTERCOSTAL NERVE BLOCK NODE DISSECTION (Right) Subjective: Patient states she is doing well today, she walked with her husband yesterday and was up in the chair most of the day. Her pain is slightly improved.  Objective: Vital signs in last 24 hours: Temp:  [97.8 F (36.6 C)-98.2 F (36.8 C)] 98.1 F (36.7 C) (03/06 0242) Pulse Rate:  [70-88] 81 (03/06 0616) Cardiac Rhythm: Normal sinus rhythm (03/05 1900) Resp:  [16-20] 20 (03/06 0616) BP: (120-179)/(66-97) 159/85 (03/06 0616) SpO2:  [93 %-96 %] 94 % (03/06 0616)  Hemodynamic parameters for last 24 hours:    Intake/Output from previous day: 03/05 0701 - 03/06 0700 In: 1169.4 [P.O.:120; I.V.:1049.4] Out: 1700 [Urine:1300; Chest Tube:400] Intake/Output this shift: Total I/O In: 227.7 [P.O.:120; I.V.:107.7] Out: 1170 [Urine:1100; Chest Tube:70]  General appearance: alert, cooperative, and no distress Neurologic: intact Heart: regular rate and rhythm, S1, S2 normal, no murmur, click, rub or gallop Lungs: Wheezing throughout Abdomen: soft, non-tender; bowel sounds normal; no masses,  no organomegaly Extremities: extremities normal, atraumatic, no cyanosis or edema Wound: Clean and dry dressing over chest tube, clean and dry incisions without erythema or sign of infection  Lab Results: Recent Labs    06/15/22 0025 06/16/22 0016  WBC 15.9* 13.5*  HGB 10.3* 9.9*  HCT 31.9* 31.2*  PLT 303 284   BMET:  Recent Labs    06/15/22 0025 06/16/22 0016  NA 134* 135  K 3.8 3.4*  CL 100 102  CO2 26 29  GLUCOSE 134* 130*  BUN 8 13  CREATININE 0.87 1.31*  CALCIUM 8.3* 7.9*    PT/INR: No results for input(s): "LABPROT", "INR" in the last 72 hours. ABG    Component Value Date/Time   PHART 7.431 06/14/2022  0828   HCO3 22.2 06/14/2022 0828   TCO2 23 06/14/2022 0828   ACIDBASEDEF 2.0 06/14/2022 0828   O2SAT 99 06/14/2022 0828   CBG (last 3)  No results for input(s): "GLUCAP" in the last 72 hours.  Assessment/Plan: S/P Procedure(s) (LRB): XI ROBOTIC ASSISTED THORACOSCOPY-RIGHT LOWER LOBECTOMY, RIGHT UPPER LOBE WEDGE (Right) INTERCOSTAL NERVE BLOCK NODE DISSECTION (Right)  Neuro: Pain relatively well controlled.    CV: NSR, HR 80s. SBP 159. Hx of hypertension, on Lisinopril '10mg'$  daily. May need to d/c due to increase in creatinine.    Pulm: Saturating 94% on 2L [place]. CT to water seal. CT output 400cc/24hrs serosanguinous drainage, only 70cc last shift. CXR with stable to improved right apical space. Some right sided subQ air. Lucency at right lung base/RUQ stable to slightly improved. No air leak with cough. Continue home inhalers. Continue IS and ambulation.    GI: No abdominal pain, nausea or vomiting. Good appetite.   Renal: Cr increased to 1.31, likely due to Toradol. Toradol has been discontinued. UO 1300cc/24 hours.    Hyponatremia: Na 135, resolved   Hypokalemia: K 3.4, supplement   ID: Leukocytosis improved, WBC 13.5. Afebrile   Expected postop ABLA: H/H 9.9/31.2, monitor   DVT Prophylaxis: Lovenox '40mg'$  daily   Dispo: Plan to D/C chest tube today vs tomorrow   LOS: 2 days    Magdalene River, PA-C 06/16/2022 Patient seen and examined, agree with above Path T2b,N0, stage  IIA- d/w patient CXR OK, some air under diaphragm due to Air seal port replacement, no abdominal issues No air leak- only 70 ml since midnight- dc chest tube Possibly home tomorrow if off O2  Remo Lipps C. Roxan Hockey, MD [facility] [phone]

## 2022-06-16 NOTE — Progress Notes (Signed)
Nurse requested Mobility Specialist to perform oxygen saturation test with pt which includes removing pt from oxygen both at rest and while ambulating.  Below are the results from that testing.     Patient Saturations on Room Air at Rest = spO2 94%  Patient Saturations on Room Air while Ambulating = sp02 84% .  Rested and performed pursed lip breathing for 1 minute with sp02 at 87%.  Patient Saturations on 2 Liters of oxygen while Ambulating = sp02 91%  At end of testing pt left in room on 1  Liters of oxygen. (93%)  Reported results to nurse.

## 2022-06-16 NOTE — Plan of Care (Signed)
  Problem: Education: Goal: Knowledge of disease or condition will improve Outcome: Progressing Goal: Knowledge of the prescribed therapeutic regimen will improve Outcome: Progressing   Problem: Activity: Goal: Risk for activity intolerance will decrease Outcome: Progressing   Problem: Cardiac: Goal: Will achieve and/or maintain hemodynamic stability Outcome: Progressing   Problem: Clinical Measurements: Goal: Postoperative complications will be avoided or minimized Outcome: Progressing   Problem: Respiratory: Goal: Respiratory status will improve Outcome: Progressing   Problem: Pain Management: Goal: Pain level will decrease Outcome: Progressing   Problem: Education: Goal: Knowledge of General Education information will improve Description: Including pain rating scale, medication(s)/side effects and non-pharmacologic comfort measures Outcome: Progressing   Problem: Health Behavior/Discharge Planning: Goal: Ability to manage health-related needs will improve Outcome: Progressing   Problem: Coping: Goal: Level of anxiety will decrease Outcome: Progressing   Problem: Elimination: Goal: Will not experience complications related to bowel motility Outcome: Progressing Goal: Will not experience complications related to urinary retention Outcome: Progressing   Problem: Pain Managment: Goal: General experience of comfort will improve Outcome: Progressing   Problem: Skin Integrity: Goal: Risk for impaired skin integrity will decrease Outcome: Progressing

## 2022-06-17 ENCOUNTER — Inpatient Hospital Stay (HOSPITAL_COMMUNITY): Payer: PPO

## 2022-06-17 LAB — CBC
HCT: 30 % — ABNORMAL LOW (ref 36.0–46.0)
Hemoglobin: 9.4 g/dL — ABNORMAL LOW (ref 12.0–15.0)
MCH: 27.3 pg (ref 26.0–34.0)
MCHC: 31.3 g/dL (ref 30.0–36.0)
MCV: 87.2 fL (ref 80.0–100.0)
Platelets: 273 10*3/uL (ref 150–400)
RBC: 3.44 MIL/uL — ABNORMAL LOW (ref 3.87–5.11)
RDW: 13.5 % (ref 11.5–15.5)
WBC: 12.8 10*3/uL — ABNORMAL HIGH (ref 4.0–10.5)
nRBC: 0 % (ref 0.0–0.2)

## 2022-06-17 LAB — BASIC METABOLIC PANEL
Anion gap: 4 — ABNORMAL LOW (ref 5–15)
BUN: 14 mg/dL (ref 8–23)
CO2: 27 mmol/L (ref 22–32)
Calcium: 7.8 mg/dL — ABNORMAL LOW (ref 8.9–10.3)
Chloride: 104 mmol/L (ref 98–111)
Creatinine, Ser: 1.12 mg/dL — ABNORMAL HIGH (ref 0.44–1.00)
GFR, Estimated: 54 mL/min — ABNORMAL LOW (ref >60)
Glucose, Bld: 128 mg/dL — ABNORMAL HIGH (ref 70–99)
Potassium: 3.9 mmol/L (ref 3.5–5.1)
Sodium: 135 mmol/L (ref 135–145)

## 2022-06-17 MED ORDER — OXYCODONE HCL 5 MG PO TABS: 5.0000 mg | ORAL_TABLET | Freq: Four times a day (QID) | ORAL | 0 refills | Status: DC | PRN

## 2022-06-17 NOTE — Progress Notes (Addendum)
[name] to be D/C'd home per MD order. Discussed with the patient and husband [name] and all questions fully answered. Portable O2 tank delivered to room. Extra dressing change supplies provided. Skin clean, dry and intact without evidence of skin break down, no evidence of skin tears noted.  IV catheter discontinued intact. Site without signs and symptoms of complications. Dressing and pressure applied.  An After Visit Summary was printed and given to the patient.  Patient escorted via [place], and D/C home via private auto.  [name]  06/17/2022

## 2022-06-17 NOTE — Progress Notes (Addendum)
[address]       [place] [address]             [phone]      3 Days Post-Op Procedure(s) (LRB): XI ROBOTIC ASSISTED THORACOSCOPY-RIGHT LOWER LOBECTOMY, RIGHT UPPER LOBE WEDGE (Right) INTERCOSTAL NERVE BLOCK NODE DISSECTION (Right) Subjective: Patient states she has some pain and tightness this morning but it has improved since the tube has come out. She is ambulating well.   Objective: Vital signs in last 24 hours: Temp:  [97.7 F (36.5 C)-98.5 F (36.9 C)] 98.2 F (36.8 C) (03/07 0357) Pulse Rate:  [81-98] 83 (03/07 0357) Cardiac Rhythm: Normal sinus rhythm (03/06 1900) Resp:  [18-28] 19 (03/07 0357) BP: (115-170)/(59-97) 137/95 (03/07 0357) SpO2:  [90 %-97 %] 91 % (03/07 0357)  Hemodynamic parameters for last 24 hours:    Intake/Output from previous day: 03/06 0701 - 03/07 0700 In: 240 [P.O.:240] Out: 1430 [Urine:1400; Chest Tube:30] Intake/Output this shift: Total I/O In: 240 [P.O.:240] Out: 900 [Urine:900]  General appearance: alert, cooperative, and no distress Neurologic: intact Heart: regular rate and rhythm, S1, S2 normal, no murmur, click, rub or gallop Lungs: Wheezes throughout, diminished at the bases Abdomen: soft, non-tender; bowel sounds normal; no masses,  no organomegaly Extremities: extremities normal, atraumatic, no cyanosis or edema Wound: Clean and dry without erythema or sign of infection. Clean and dry dressing over chest tube incision.   Lab Results: Recent Labs    06/16/22 0016 06/17/22 0020  WBC 13.5* 12.8*  HGB 9.9* 9.4*  HCT 31.2* 30.0*  PLT 284 273   BMET:  Recent Labs    06/16/22 0016 06/17/22 0020  NA 135 135  K 3.4* 3.9  CL 102 104  CO2 29 27  GLUCOSE 130* 128*  BUN 13 14  CREATININE 1.31* 1.12*  CALCIUM 7.9* 7.8*    PT/INR: No results for input(s): "LABPROT", "INR" in the last 72 hours. ABG    Component Value Date/Time   PHART 7.431 06/14/2022 0828   HCO3 22.2 06/14/2022 0828    TCO2 23 06/14/2022 0828   ACIDBASEDEF 2.0 06/14/2022 0828   O2SAT 99 06/14/2022 0828   CBG (last 3)  No results for input(s): "GLUCAP" in the last 72 hours.  Assessment/Plan: S/P Procedure(s) (LRB): XI ROBOTIC ASSISTED THORACOSCOPY-RIGHT LOWER LOBECTOMY, RIGHT UPPER LOBE WEDGE (Right) INTERCOSTAL NERVE BLOCK NODE DISSECTION (Right)  Neuro: Pain well controlled.    CV: NSR, HR 80s. SBP 115-167. Hx of hypertension, continue Lisinopril '10mg'$  daily.   Pulm: Saturating 92% on 1L [place]. Documented saturations dropping with ambulation. CT removed yesterday. CXR with stable small right apical space, stable subQ air and small right pleural effusion. Continue home inhalers. Continue IS, flutter valve and ambulation.    GI: CXR with some air under diaphragm due to port placement. No abdominal pain, nausea or vomiting. Good appetite. -BM, passing gas.   Renal: Cr down to 1.12 today from 1.31, was likely due to Toradol. UO 1400cc/24 hours.    Hyponatremia: Na 135, resolved   Hypokalemia: Resolved, K 3.9   ID: Leukocytosis improved, WBC 12.8. Afebrile   Expected postop ABLA: H/H 9.4/30, monitor   DVT Prophylaxis: Lovenox '40mg'$  daily  Pathology: T2b, N0, stage IIA   Dispo: Wean oxygen as able today. May need home oxygen. Hopefully home tomorrow.      LOS: 3 days    Magdalene River PA-C 06/17/2022 Patient seen and examined, agree with above Will dc home today May  need short term home O2- will reassess this AM  Remo Lipps C. Roxan Hockey, MD [facility] [phone]

## 2022-06-17 NOTE — Progress Notes (Signed)
Mobility Specialist Progress Note:   06/17/22 0937  Mobility  Activity Ambulated with assistance in hallway  Level of Assistance Modified independent, requires aide device or extra time  Assistive Device None  Distance Ambulated (ft) 260 ft  Activity Response Tolerated well  $Mobility charge 1 Mobility   Pt in bed willing to participate in mobility. No complaints of pain. Left in bed with call bell in reach and all needs met.   Gareth Eagle Mitcheal Sweetin Mobility Specialist Please contact via [facility] [employer] or  Rehab Office at [phone]

## 2022-06-17 NOTE — TOC Progression Note (Addendum)
Transition of Care [facility]) - Progression Note    Patient Details  Name: [name] MRN: [id number] Date of Birth: November 01, 1955  [facility]) CM/SW Contact  Loletha Grayer Beverely Pace, RN Phone Number: 06/17/2022, 10:53 AM  Clinical Narrative:     Case Manager spoke with patient concerning need for oxygen at home. Patient confirmed address and phone number with CM. Referral called to [facility] with [employer], oxygen transport tank will be delivered to patient room.  Expected Discharge Plan: Home/Self Care Barriers to Discharge: No Barriers Identified  Expected Discharge Plan and Services   Discharge Planning Services: CM Consult Post Acute Care Choice: Durable Medical Equipment                   DME Arranged: Oxygen DME Agency: [employer] Date DME Agency Contacted: 06/17/22 Time DME Agency Contacted: [id number] Representative spoke with at DME Agency: Melene Muller HH Arranged: NA [place] Agency: NA         Social Determinants of Health ([place]) Interventions SDOH Screenings   Food Insecurity: No Food Insecurity (05/04/2022)  Housing: Low Risk  (05/04/2022)  Transportation Needs: No Transportation Needs (05/04/2022)  Utilities: Not At Risk (05/04/2022)  Alcohol Screen: Low Risk  (01/20/2021)  Depression (PHQ2-9): Low Risk  (05/04/2022)  Financial Resource Strain: Unknown (01/20/2021)  Physical Activity: Inactive (01/20/2021)  Social Connections: Moderately Isolated (01/20/2021)  Stress: Stress Concern Present (01/20/2021)  Tobacco Use: High Risk (06/15/2022)    Readmission Risk Interventions     No data to display

## 2022-06-17 NOTE — Progress Notes (Addendum)
Nurse requested Mobility Specialist to perform oxygen saturation test with pt which includes removing pt from oxygen both at rest and while ambulating.  Below are the results from that testing.     Patient Saturations on Room Air at Rest = spO2 93%  Patient Saturations on Room Air while Ambulating = sp02 85% .    Patient Saturations on 2 Liters of oxygen while Ambulating = sp02 92%  At end of testing pt left in room on 1  Liters of oxygen. (92%)  Reported results to nurse.   I agree with above oxygen saturations. Oxygen saturation on room air with ambulation proves the need for home oxygen.  Magdalene River, PA-C 06/17/22

## 2022-06-21 ENCOUNTER — Ambulatory Visit: Payer: PPO | Admitting: Hematology

## 2022-06-22 ENCOUNTER — Ambulatory Visit: Payer: PPO | Admitting: Internal Medicine

## 2022-06-28 ENCOUNTER — Ambulatory Visit: Payer: PPO | Admitting: Thoracic Surgery (Cardiothoracic Vascular Surgery)

## 2022-06-28 ENCOUNTER — Other Ambulatory Visit: Payer: Self-pay | Admitting: Thoracic Surgery (Cardiothoracic Vascular Surgery)

## 2022-06-28 DIAGNOSIS — R911 Solitary pulmonary nodule: Secondary | ICD-10-CM

## 2022-06-29 ENCOUNTER — Encounter: Payer: Self-pay | Admitting: Thoracic Surgery (Cardiothoracic Vascular Surgery)

## 2022-06-29 ENCOUNTER — Ambulatory Visit
Admission: RE | Admit: 2022-06-29 | Discharge: 2022-06-29 | Disposition: A | Discharge status: Home / Self Care | Payer: PPO | Source: Ambulatory Visit | Attending: Thoracic Surgery (Cardiothoracic Vascular Surgery) | Admitting: Thoracic Surgery (Cardiothoracic Vascular Surgery)

## 2022-06-29 ENCOUNTER — Ambulatory Visit (INDEPENDENT_AMBULATORY_CARE_PROVIDER_SITE_OTHER): Payer: Self-pay | Admitting: Physician Assistant

## 2022-06-29 VITALS — BP 137/85 | HR 71 | Resp 20 | Wt 117.0 lb

## 2022-06-29 DIAGNOSIS — Z902 Acquired absence of lung [part of]: Secondary | ICD-10-CM

## 2022-06-29 DIAGNOSIS — R911 Solitary pulmonary nodule: Secondary | ICD-10-CM

## 2022-06-29 MED ORDER — AMOXICILLIN-POT CLAVULANATE 875-125 MG PO TABS: 1.0000 | ORAL_TABLET | Freq: Two times a day (BID) | ORAL | 0 refills | Status: DC

## 2022-06-29 NOTE — Patient Instructions (Signed)
Take Augmentin for 7 days for cough and possible pneumonia  Follow up in clinic in 1 month with CXR  Continue weight restriction of about 10 lbs

## 2022-06-29 NOTE — Progress Notes (Signed)
[address]       ,[place] [address]             [phone]       HPI: [name] a 67 year old woman with a past medical history significant for tobacco abuse, COPD, cervical cancer, hypertension, hypothyroidism, osteoporosis, arthritis, anxiety and depression. She presents to the clinic today for routine postoperative follow-up having undergone robotic assisted right lower lobectomy and right upper lobe wedge resection on 06/14/22.  She had a routine postoperative course and was discharged home on home oxygen in stable condition on 03/07. Since hospital discharge the patient reports some pain that interrupts her sleep but she only takes about 1-2 Oxycodone per day as needed. She also complains of a new cough that started about 2 days ago that is productive with yellow phlegm. She denies shortness of breath and is able to ambulate but she is still using [place] oxygen. She denies dizziness or lower extremity swelling.    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 She follows up with Dr. Delton Coombes on 07/12/22.   Current Outpatient Medications  Medication Sig Dispense Refill   acetaminophen (TYLENOL) 325 MG tablet Take 650 mg by mouth every 6 (six) hours as needed for headache.     albuterol (VENTOLIN HFA) 108 (90 Base) MCG/ACT inhaler INHALE 2 PUFFS INTO THE LUNGS EVERY 6 HOURS AS NEEDED FOR WHEEZING OR SHORTNESS OF BREATH. 8.5 g 2   alendronate (FOSAMAX) 70 MG tablet Take 70 mg by mouth once a week.     ALPRAZolam (XANAX) 1 MG tablet Take 1 mg by mouth at bedtime. May take additional 1 mg during the day if needed     atorvastatin (LIPITOR) 40 MG tablet Take 40 mg by mouth at bedtime.      benzonatate (TESSALON) 100 MG capsule Take 100 mg by mouth 3 (three) times daily as needed for cough.     FLUoxetine (PROZAC) 40 MG capsule Take 40 mg by mouth daily.     Fluticasone-Umeclidin-Vilant (TRELEGY ELLIPTA) 100-62.5-25 MCG/ACT AEPB Inhale 1 each  into the lungs daily. 60 each 2   hydrochlorothiazide (HYDRODIURIL) 25 MG tablet Take 1 tablet (25 mg total) by mouth daily. (Patient taking differently: Take 25 mg by mouth daily as needed (if blood pressure still high after taking lisinopril).) 90 tablet 3   lisinopril (ZESTRIL) 10 MG tablet Take 10 mg by mouth daily as needed (blood pressure of 170/99).     oxyCODONE (OXY IR/ROXICODONE) 5 MG immediate release tablet Take 1 tablet (5 mg total) by mouth every 6 (six) hours as needed for moderate pain. 28 tablet 0   traZODone (DESYREL) 100 MG tablet Take 100 mg by mouth at bedtime.     No current facility-administered medications for this visit.  Vitals: Today's Vitals   06/29/22 1500  BP: 137/85  Pulse: 71  Resp: 20  SpO2: 94%  Weight: 117 lb (53.1 kg)   Body mass index is 19.47 kg/m.  Physical Exam: General: No acute distress Neuro: Alert and oriented, grossly intact CV: Regular rate and rhythm, no murmur Pulm: Minimal intermittent rhonchi, slightly diminished bibasilar breath sounds Extremities: No edema Wound: Clean and dry, no erythema or sign of infection  Diagnostic Tests: CLINICAL DATA:  Status post right lower lobectomy.   EXAM: CHEST - 2 VIEW   COMPARISON:  Chest x-ray dated June 17, 2022   FINDINGS: Cardiac and mediastinal contours within normal  limits. Postsurgical changes of right lower lobectomy. Small right pleural effusion. Left basilar opacity which is new when compared with prior radiograph. No evidence of pneumothorax.   IMPRESSION: 1. New left basilar opacity, concerning for infection or aspiration. 2. Postsurgical changes of right lower lobectomy. 3. Small right pleural effusion.     Electronically Signed   By: Yetta Glassman M.D.   On: 06/29/2022 14:20  Impression: [name] is progressing well from her right lower lobectomy and right upper wedge resection. She has some pain for which she takes Oxycodone as needed but still has 10 tablets  left. She continues to use home oxygen, we discussed weaning oxygen as tolerated. On CXR she had a new left basilar opacity and she has a new productive cough. We will plan to start her on Augmentin x 7 days for possible pneumonia. We discussed continuing ambulation and pulmonary hygiene. Her incisions are healing well. No driving until off of pain medications. Continue lifting restrictions until pain has improved. Plan to follow up with CXR in 1 month.   Her pathology showed squamous cell carcinoma T2b N0. She will follow up with her oncologist in April.    Magdalene River, PA-C [facility] [phone]

## 2022-06-30 ENCOUNTER — Telehealth: Payer: Self-pay | Admitting: *Deleted

## 2022-06-30 NOTE — Telephone Encounter (Signed)
Per B. Stehler, PA, patient referred for home health services for weaning of home O2. RN services unavailable in her area due to staffing. Patient contacted to set up 6 min walk test. Patient states she has been attempting to wean herself off of oxygen. States she has been off this morning for an hour and her O2 saturation after ambulating was 91% on RA. Advised patient she may continue to wean her self as tolerated but to wear the oxygen if her saturation drops below 90%. Patient needing to check her schedule and call back to schedule 6 min walk test with RN.

## 2022-07-06 ENCOUNTER — Encounter (HOSPITAL_COMMUNITY): Payer: PPO

## 2022-07-11 NOTE — Progress Notes (Signed)
[facility] [address], [place] [address]    Clinic Day:  07/12/2022  Referring physician: [name], MD  Patient Care Team: [name], MD as PCP - General (Internal Medicine) Melina Schools, MD as Consulting Physician (Orthopedic Surgery) Rourk, Cristopher Estimable, MD as Consulting Physician (Gastroenterology) Derek Jack, MD as Medical Oncologist (Medical Oncology) Brien Mates, RN as Oncology Nurse Navigator (Medical Oncology)   ASSESSMENT & PLAN:   Assessment: 1.  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 - 18 pound weight loss in the last 3 months due to decreased appetite - Chest x-ray on 04/17/2022: Mass in the right lower lobe. - CT chest (04/26/2022): 3.8 cm cavitary mass in the anterior right lower lobe abutting the major fissure.  4 mm subpleural nodule in the medial right upper lobe is nonspecific.  No adenopathy on noncontrast exam. - PET scan (05/06/2022): 3.8 x 2.3 cm hypermetabolic right lower lobe mass SUV 17.8.  Mildly metabolic asymmetric right hilar nodal tissue with SUV 4.2.  Prominent precarinal lymph node 9 mm with SUV 2.2. - Brain MRI (05/19/2022): No evidence of intracranial metastatic disease. - Bronchoscopy and biopsy by Dr. Valeta Harms. - Pathology: Lymph node 11 R, 7 with no malignant cells.  Right lower lobe cavitary mass FNA consistent with squamous cell carcinoma, keratinizing. - 06/14/2022: Robotic assisted right lower lobectomy, lymph node dissection by Dr. Roxan Hockey. - Pathology: 4.3 x 3.9 x 2.8 cm right lower lobe squamous cell carcinoma, margins negative to.  Negative visceral pleural invasion and lymphovascular invasion.  0/18 lymph nodes involved.  Nodal stations examined include 4, 7, 8, 9, 10, 11, 12.  PT2b pN0.   2.  Social/family history: - Lives at home with her husband [name]. - She has been on disability since she was diagnosed with cervical  cancer.  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  No chemical exposure. - She is independent of ADLs and IADLs. - Current active smoker and started smoking 1 pack/day at age 58. - Maternal aunt had breast cancer.   3.  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 - XRT 30 Gray in 5 fractions to the vaginal cuff from 02/22/2017 through 03/22/2017.  Plan: 1.  Stage IIa (T2b N0) right lower lobe squamous cell carcinoma: - We have reviewed the pathology reports in detail which showed tumor measuring 4.3 cm. - I have recommended adjuvant chemotherapy.  She is not a candidate for cisplatin based on occasional elevated creatinine.  Recommend carboplatin and docetaxel every 21 days for 4 cycles. - Also recommend NGS testing.  If she has EGFR mutation positive, she will be offered osimertinib for 3 years.  If no EGFR or ALK mutation, she will be offered pembrolizumab for 1 year after completion of adjuvant chemotherapy. - Recommend port placement. - She will tentatively start cycle 1 in 2 weeks.  Orders Placed This Encounter  Procedures   IR IMAGING GUIDED PORT INSERTION    ASAP    Standing Status:   Future    Standing Expiration Date:   07/12/2023    Order Specific Question:   Reason for Exam (SYMPTOM  OR DIAGNOSIS REQUIRED)    Answer:   chemotherapy administration    Order Specific Question:   Preferred Imaging Location?    Answer:   [facility]  Order Specific Question:   Release to patient    Answer:   Immediate   Magnesium    Standing Status:   Future    Standing Expiration Date:   07/26/2023   CBC with Differential    Standing Status:   Future    Standing Expiration Date:   07/26/2023   Comprehensive metabolic panel    Standing Status:   Future    Standing Expiration Date:   07/26/2023   Magnesium    Standing Status:   Future    Standing Expiration Date:   08/16/2023   CBC with  Differential    Standing Status:   Future    Standing Expiration Date:   08/16/2023   Comprehensive metabolic panel    Standing Status:   Future    Standing Expiration Date:   08/16/2023   Magnesium    Standing Status:   Future    Standing Expiration Date:   09/06/2023   CBC with Differential    Standing Status:   Future    Standing Expiration Date:   09/06/2023   Comprehensive metabolic panel    Standing Status:   Future    Standing Expiration Date:   09/06/2023   Magnesium    Standing Status:   Future    Standing Expiration Date:   09/27/2023   CBC with Differential    Standing Status:   Future    Standing Expiration Date:   09/27/2023   Comprehensive metabolic panel    Standing Status:   Future    Standing Expiration Date:   09/27/2023      I,Katie Daubenspeck,acting as a scribe for Derek Jack, MD.,have documented all relevant documentation on the behalf of Derek Jack, MD,as directed by  Derek Jack, MD while in the presence of Derek Jack, MD.   I, Derek Jack MD, have reviewed the above documentation for accuracy and completeness, and I agree with the above.   Derek Jack, MD   4/1/20245:26 PM  CHIEF COMPLAINT:   Diagnosis: Right lower lobe cavitary lung mass    Cancer Staging  Primary squamous cell carcinoma of lower lobe of right lung Staging form: Lung, AJCC 8th Edition - Clinical stage from 05/23/2022: Stage IIA (cT2b, cN0, cM0) - Unsigned    Prior Therapy: Right lower lobectomy and lymph node dissection  Current Therapy: Adjuvant chemotherapy with carboplatin and docetaxel   HISTORY OF PRESENT ILLNESS:   Oncology History  Primary squamous cell carcinoma of lower lobe of right lung  05/23/2022 Initial Diagnosis   Primary squamous cell carcinoma of lower lobe of right lung   07/26/2022 -  Chemotherapy   Patient is on Treatment Plan : LUNG Carboplatin (AUC 6) + Docetaxel (75) q21d x 4 cycles        INTERVAL  HISTORY:   Geniel is a 67 y.o. female presenting to clinic today for follow up of right lower lobe cavitary lung mass. She was last seen by me on 05/24/22.  Since her last visit, she proceeded with right lower lobectomy and right upper lobe wedge resection on 06/14/22 under Dr. Roxan Hockey. Pathology from the procedure confirmed 4.3 cm squamous cell carcinoma to RLL involving subpleural connective tissue. Margins and all 18 lymph nodes were negative. Pathology from RUL wedge was benign.  Of note, she recently completed a course of antibiotics postoperatively for possible pneumonia.  Today, she states that she is doing well overall. Her appetite level is at 100% . Her energy level is at 25%.  PAST MEDICAL HISTORY:   Past Medical History: Past Medical History:  Diagnosis Date   Anxiety    Arthritis    spinal stenosis   Back disorder    "crooked spine"   Bulging lumbar disc    Cervical cancer    Complication of anesthesia    ? hypotension 10/2016 at [facility] surgery    Depression    Dyspnea    H/O degenerative disc disease    History of radiation therapy 02/22/17-03/22/17   vaginal cuff 30 Gy in 5 fractions   Hypertension    Hypothyroidism    patient taken off of hypothyroid med in 11/2016    Osteoporosis    Thyroid disease     Surgical History: Past Surgical History:  Procedure Laterality Date   ABDOMINAL EXPOSURE N/A 09/27/2018   Procedure: ABDOMINAL EXPOSURE;  Surgeon: Serafina Mitchell, MD;  Location: [facility];  Service: Vascular;  Laterality: N/A;   ABDOMINAL HYSTERECTOMY     ANTERIOR AND POSTERIOR REPAIR N/A 11/09/2016   Procedure: ANTERIOR (CYSTOCELE) AND POSTERIOR REPAIR (RECTOCELE);  Surgeon: Jonnie Kind, MD;  Location: [facility];  Service: Gynecology;  Laterality: N/A;   ANTERIOR LUMBAR FUSION Left 09/27/2018   Procedure: ANTERIOR LUMBAR FUSION L5-S1,;  Surgeon: Melina Schools, MD;  Location: [facility];  Service: Orthopedics;  Laterality: Left;  4.5 HRS/ DR. Trula Slade ASSIST    BACK SURGERY     BILATERAL SALPINGECTOMY Left 11/09/2016   Procedure: LEFT SALPINGECTOMY;  Surgeon: Jonnie Kind, MD;  Location: [facility];  Service: Gynecology;  Laterality: Left;   BRONCHIAL BIOPSY  05/13/2022   Procedure: BRONCHIAL BIOPSIES;  Surgeon: Garner Nash, DO;  Location: [facility];  Service: Pulmonary;;   BRONCHIAL BRUSHINGS  05/13/2022   Procedure: BRONCHIAL BRUSHINGS;  Surgeon: Garner Nash, DO;  Location: [facility];  Service: Pulmonary;;   BRONCHIAL NEEDLE ASPIRATION BIOPSY  05/13/2022   Procedure: BRONCHIAL NEEDLE ASPIRATION BIOPSIES;  Surgeon: Garner Nash, DO;  Location: [facility];  Service: Pulmonary;;   CATARACT EXTRACTION W/PHACO Right 07/05/2016   Procedure: CATARACT EXTRACTION PHACO AND INTRAOCULAR LENS PLACEMENT (IOC);  Surgeon: Tonny [place], MD;  Location: [facility];  Service: Ophthalmology;  Laterality: Right;  CDE: 15.41   CATARACT EXTRACTION W/PHACO Left 07/19/2016   Procedure: CATARACT EXTRACTION PHACO AND INTRAOCULAR LENS PLACEMENT LEFT EYE CDE= 12.65;  Surgeon: Tonny [place], MD;  Location: [facility];  Service: Ophthalmology;  Laterality: Left;  left   COLONOSCOPY WITH PROPOFOL N/A 09/09/2021   Procedure: COLONOSCOPY WITH PROPOFOL;  Surgeon: Daneil Dolin, MD;  [facility];  Service: Endoscopy;  Laterality: N/A;  10:30am   EYE SURGERY     FINE NEEDLE ASPIRATION  05/13/2022   Procedure: FINE NEEDLE ASPIRATION (FNA) LINEAR;  Surgeon: Garner Nash, DO;  Location: [facility];  Service: Pulmonary;;   INTERCOSTAL NERVE BLOCK  06/14/2022   Procedure: INTERCOSTAL NERVE BLOCK;  Surgeon: Melrose Nakayama, MD;  Location: [facility];  Service: Thoracic;;   LUMBAR LAMINECTOMY/DECOMPRESSION MICRODISCECTOMY Left 09/27/2018   Procedure: L4-L5 Lumbar Laminectomy/Decompression;  Surgeon: Melina Schools, MD;  Location: [facility];  Service: Orthopedics;  Laterality: Left;   NODE DISSECTION Right 06/14/2022   Procedure: NODE DISSECTION;  Surgeon: Melrose Nakayama, MD;  Location: [facility];  Service: Thoracic;  Laterality: Right;   POLYPECTOMY  09/09/2021   Procedure: POLYPECTOMY;  Surgeon: Daneil Dolin, MD;  [facility];  Service: Endoscopy;;   ROBOTIC PELVIC AND PARA-AORTIC LYMPH NODE DISSECTION N/A 01/11/2017  Procedure: XI ROBOTIC BILATERAL PELVIC LYMPH NODE DISSECTION;  Surgeon: [name], MD;  Location: [facility];  Service: Gynecology;  Laterality: N/A;   SALPINGOOPHORECTOMY Right 11/09/2016   Procedure: RIGHT SALPINGO OOPHORECTOMY;  Surgeon: Jonnie Kind, MD;  Location: [facility];  Service: Gynecology;  Laterality: Right;   TUBAL LIGATION     VAGINAL HYSTERECTOMY N/A 11/09/2016   Procedure: HYSTERECTOMY VAGINAL;  Surgeon: Jonnie Kind, MD;  [facility];  Service: Gynecology;  Laterality: N/A;   VIDEO BRONCHOSCOPY WITH ENDOBRONCHIAL ULTRASOUND Right 05/13/2022   Procedure: VIDEO BRONCHOSCOPY WITH ENDOBRONCHIAL ULTRASOUND;  Surgeon: Garner Nash, DO;  Location: [facility];  Service: Pulmonary;  Laterality: Right;    Social History: Social History   Socioeconomic History   Marital status: Legally Separated    Spouse name: Not on file   Number of children: 1   Years of education: Not on file   Highest education level: Not on file  Occupational History   Not on file  Tobacco Use   Smoking status: Every Day    Packs/day: 1.00    Years: 44.00    Additional pack years: 0.00    Total pack years: 44.00    Types: Cigarettes   Smokeless tobacco: Never   Tobacco comments:    Smokes 7 packs of cigarettes a week. 05/05/2022 [place]  Vaping Use   Vaping Use: Never used  Substance and Sexual Activity   Alcohol use: No    Comment: 01-06-2016 Per pt rarely, 02-05-2016 per pt no but 74yrs ago     Drug use: No    Comment: 02-05-2016 per pt no but about 40 yrs ago   Sexual activity: Not Currently    Birth control/protection: Surgical    Comment: hyst  Other Topics Concern   Not on file  Social History Narrative   Not on  file   Social Determinants of Health   Financial Resource Strain: Unknown (01/20/2021)   Overall Financial Resource Strain (CARDIA)    Difficulty of Paying Living Expenses: Patient declined  Food Insecurity: No Food Insecurity (05/04/2022)   Hunger Vital Sign    Worried About Running Out of Food in the Last Year: Never true    Ran Out of Food in the Last Year: Never true  Transportation Needs: No Transportation Needs (05/04/2022)   PRAPARE - Hydrologist (Medical): No    Lack of Transportation (Non-Medical): No  Physical Activity: Inactive (01/20/2021)   Exercise Vital Sign    Days of Exercise per Week: 1 day    Minutes of Exercise per Session: 0 min  Stress: Stress Concern Present (01/20/2021)   [place]    Feeling of Stress : Very much  Social Connections: Moderately Isolated (01/20/2021)   Social Connection and Isolation Panel [NHANES]    Frequency of Communication with Friends and Family: Twice a week    Frequency of Social Gatherings with Friends and Family: Never    Attends Religious Services: More than 4 times per year    Active Member of [employer] or Organizations: No    Attends Archivist Meetings: Never    Marital Status: Married  Human resources officer Violence: Not At Risk (05/04/2022)   Humiliation, Afraid, Rape, and Kick questionnaire    Fear of Current or Ex-Partner: No    Emotionally Abused: No    Physically Abused: No    Sexually Abused: No    Family History: Family History  Problem Relation Age of Onset   Hypertension Mother    Congenital heart disease Mother    Diabetes Mother    Thyroid disease Brother    Other Daughter        bowel issues   Colon cancer Neg Hx    Colon polyps Neg Hx     Current Medications:  Current Outpatient Medications:    acetaminophen (TYLENOL) 325 MG tablet, Take 650 mg by mouth every 6 (six) hours as needed for headache., Disp:  , Rfl:    albuterol (VENTOLIN HFA) 108 (90 Base) MCG/ACT inhaler, INHALE 2 PUFFS INTO THE LUNGS EVERY 6 HOURS AS NEEDED FOR WHEEZING OR SHORTNESS OF BREATH., Disp: 8.5 g, Rfl: 2   alendronate (FOSAMAX) 70 MG tablet, Take 70 mg by mouth once a week., Disp: , Rfl:    ALPRAZolam (XANAX) 1 MG tablet, Take 1 mg by mouth at bedtime. May take additional 1 mg during the day if needed, Disp: , Rfl:    atorvastatin (LIPITOR) 40 MG tablet, Take 40 mg by mouth at bedtime. , Disp: , Rfl:    benzonatate (TESSALON) 100 MG capsule, Take 100 mg by mouth 3 (three) times daily as needed for cough., Disp: , Rfl:    FLUoxetine (PROZAC) 40 MG capsule, Take 40 mg by mouth daily., Disp: , Rfl:    Fluticasone-Umeclidin-Vilant (TRELEGY ELLIPTA) 100-62.5-25 MCG/ACT AEPB, Inhale 1 each into the lungs daily., Disp: 60 each, Rfl: 2   hydrochlorothiazide (HYDRODIURIL) 25 MG tablet, Take 1 tablet (25 mg total) by mouth daily. (Patient taking differently: Take 25 mg by mouth daily as needed (if blood pressure still high after taking lisinopril).), Disp: 90 tablet, Rfl: 3   lisinopril (ZESTRIL) 10 MG tablet, Take 10 mg by mouth daily as needed (blood pressure of 170/99)., Disp: , Rfl:    oxyCODONE (OXY IR/ROXICODONE) 5 MG immediate release tablet, Take 1 tablet (5 mg total) by mouth every 6 (six) hours as needed for moderate pain., Disp: 28 tablet, Rfl: 0   traZODone (DESYREL) 100 MG tablet, Take 100 mg by mouth at bedtime., Disp: , Rfl:    Allergies: Allergies  Allergen Reactions   Zithromax [Azithromycin] Rash and Other (See Comments)    Blisters in mouth    Prednisone Nausea Only    Sick to stomach Can take in low dose    REVIEW OF SYSTEMS:   Review of Systems  Constitutional:  Negative for chills, fatigue and fever.  HENT:   Negative for lump/mass, mouth sores, nosebleeds, sore throat and trouble swallowing.   Eyes:  Negative for eye problems.  Respiratory:  Positive for cough. Negative for shortness of breath.    Cardiovascular:  Negative for chest pain, leg swelling and palpitations.  Gastrointestinal:  Negative for abdominal pain, constipation, diarrhea, nausea and vomiting.  Genitourinary:  Negative for bladder incontinence, difficulty urinating, dysuria, frequency, hematuria and nocturia.   Musculoskeletal:  Negative for arthralgias, back pain, flank pain, myalgias and neck pain.  Skin:  Negative for itching and rash.  Neurological:  Positive for headaches. Negative for dizziness and numbness.  Hematological:  Does not bruise/bleed easily.  Psychiatric/Behavioral:  Negative for depression, sleep disturbance and suicidal ideas. The patient is not nervous/anxious.   All other systems reviewed and are negative.    VITALS:   Blood pressure (!) 156/93, pulse 88, temperature 97.7 F (36.5 C), temperature source Tympanic, resp. rate 18, height 5' 5.5" (1.664 m), weight 120 lb (54.4 kg), SpO2 96 %.  Wt Readings from  Last 3 Encounters:  07/12/22 120 lb (54.4 kg)  06/29/22 117 lb (53.1 kg)  06/14/22 125 lb (56.7 kg)    Body mass index is 19.67 kg/m.  Performance status (ECOG): 1 - Symptomatic but completely ambulatory  PHYSICAL EXAM:   Physical Exam Vitals and nursing note reviewed. Exam conducted with a chaperone present.  Constitutional:      Appearance: Normal appearance.  Cardiovascular:     Rate and Rhythm: Normal rate and regular rhythm.     Pulses: Normal pulses.     Heart sounds: Normal heart sounds.  Pulmonary:     Effort: Pulmonary effort is normal.     Breath sounds: Normal breath sounds.  Abdominal:     Palpations: Abdomen is soft. There is no hepatomegaly, splenomegaly or mass.     Tenderness: There is no abdominal tenderness.  Musculoskeletal:     Right lower leg: No edema.     Left lower leg: No edema.  Lymphadenopathy:     Cervical: No cervical adenopathy.     Right cervical: No superficial, deep or posterior cervical adenopathy.    Left cervical: No superficial,  deep or posterior cervical adenopathy.     Upper Body:     Right upper body: No supraclavicular or axillary adenopathy.     Left upper body: No supraclavicular or axillary adenopathy.  Neurological:     General: No focal deficit present.     Mental Status: She is alert and oriented to person, place, and time.  Psychiatric:        Mood and Affect: Mood normal.        Behavior: Behavior normal.     LABS:      Latest Ref Rng & Units 06/17/2022   12:20 AM 06/16/2022   12:16 AM 06/15/2022   12:25 AM  CBC  WBC 4.0 - 10.5 K/uL 12.8  13.5  15.9   Hemoglobin 12.0 - 15.0 g/dL 9.4  9.9  10.3   Hematocrit 36.0 - 46.0 % 30.0  31.2  31.9   Platelets 150 - 400 K/uL 273  284  303       Latest Ref Rng & Units 06/17/2022   12:20 AM 06/16/2022   12:16 AM 06/15/2022   12:25 AM  CMP  Glucose 70 - 99 mg/dL 128  130  134   BUN 8 - 23 mg/dL 14  13  8    Creatinine 0.44 - 1.00 mg/dL 1.12  1.31  0.87   Sodium 135 - 145 mmol/L 135  135  134   Potassium 3.5 - 5.1 mmol/L 3.9  3.4  3.8   Chloride 98 - 111 mmol/L 104  102  100   CO2 22 - 32 mmol/L 27  29  26    Calcium 8.9 - 10.3 mg/dL 7.8  7.9  8.3   Total Protein 6.5 - 8.1 g/dL  5.0    Total Bilirubin 0.3 - 1.2 mg/dL  0.2    Alkaline Phos 38 - 126 U/L  67    AST 15 - 41 U/L  19    ALT 0 - 44 U/L  9       No results found for: "CEA1", "CEA" / No results found for: "CEA1", "CEA" No results found for: "PSA1" No results found for: "[id number]" No results found for: "CAN125"  No results found for: "TOTALPROTELP", "ALBUMINELP", "A1GS", "A2GS", "BETS", "BETA2SER", "GAMS", "MSPIKE", "SPEI" No results found for: "TIBC", "FERRITIN", "IRONPCTSAT" No results found for: "LDH"   STUDIES:  DG Chest 2 View  Result Date: 06/29/2022 CLINICAL DATA:  Status post right lower lobectomy. EXAM: CHEST - 2 VIEW COMPARISON:  Chest x-ray dated June 17, 2022 FINDINGS: Cardiac and mediastinal contours within normal limits. Postsurgical changes of right lower lobectomy. Small right  pleural effusion. Left basilar opacity which is new when compared with prior radiograph. No evidence of pneumothorax. IMPRESSION: 1. New left basilar opacity, concerning for infection or aspiration. 2. Postsurgical changes of right lower lobectomy. 3. Small right pleural effusion. Electronically Signed   By: Yetta Glassman M.D.   On: 06/29/2022 14:20   DG Chest 2 View  Result Date: 06/17/2022 CLINICAL DATA:  Status post lobectomy. EXAM: CHEST - 2 VIEW COMPARISON:  Multiple priors including most recent chest radiograph June 16, 2022 FINDINGS: Similar size of the small right apical pneumothorax. Increase in the gas component of the partially loculated small right hydropneumothorax. Increased interstitial thickening and airspace opacity in the right lower lobe. Cardiomediastinal silhouette is unchanged. Aortic atherosclerosis. Osseous structures are unchanged. Similar subcutaneous edema in the right chest wall. IMPRESSION: 1. Similar size of the small right apical pneumothorax. Increase in the gas component of the partially loculated small right hydropneumothorax. 2. Increased interstitial thickening and airspace opacity in the right lower lobe. Electronically Signed   By: Dahlia Bailiff M.D.   On: 06/17/2022 08:13   DG Chest 1V REPEAT Same Day  Result Date: 06/16/2022 CLINICAL DATA:  Chest tube removal. EXAM: CHEST - 1 VIEW SAME DAY COMPARISON:  Same day. FINDINGS: Right-sided chest tube noted on prior exam has been removed. Small right apical pneumothorax remains. Subcutaneous emphysema is seen overlying right lateral chest wall. Small right basilar pleural effusion is noted. IMPRESSION: Stable small right apical pneumothorax is noted status post right-sided chest tube removal. Stable subcutaneous emphysema and small right pleural effusion. Electronically Signed   By: Marijo Conception M.D.   On: 06/16/2022 13:37   DG Chest 1V REPEAT Same Day  Result Date: 06/16/2022 CLINICAL DATA:  Status post lobectomy on  the RIGHT EXAM: CHEST - 1 VIEW SAME DAY COMPARISON:  06/16/2022 FINDINGS: RIGHT chest tube in place. Small RIGHT apical pneumothorax unchanged. Volume loss in the RIGHT hemithorax related to lobectomy. No pulmonary edema. Subcutaneous gas along the RIGHT chest wall again noted. No mediastinal shift. LEFT lung clear. IMPRESSION: Persistent RIGHT apical pneumothorax chest tube in place. Electronically Signed   By: Suzy Bouchard M.D.   On: 06/16/2022 12:12   DG CHEST PORT 1 VIEW  Result Date: 06/16/2022 CLINICAL DATA:  Pneumothorax. EXAM: PORTABLE CHEST 1 VIEW COMPARISON:  Chest radiograph 06/15/2022 FINDINGS: The cardiomediastinal silhouette is unchanged with normal heart size. Aortic atherosclerosis is noted. A right chest tube remains in place. A small right pneumothorax is unchanged. Crescentic lucency at the lateral right lung base is also unchanged. Linear opacities in both lung bases are stable to mildly increased likely reflect atelectasis. No sizable pleural effusion is evident. Soft tissue emphysema in the right chest wall is similar to the prior study. IMPRESSION: 1. Unchanged small right pneumothorax with chest tube in place. 2. Unchanged crescentic lucency over the lateral right lung base which again may be due to the pneumothorax and atelectasis, although pneumoperitoneum is not excluded. 3. Bibasilar atelectasis. Electronically Signed   By: Logan Bores M.D.   On: 06/16/2022 08:38   DG Chest Port 1 View  Result Date: 06/15/2022 CLINICAL DATA:  Pneumothorax status post chest tube placement EXAM: PORTABLE CHEST 1 VIEW COMPARISON:  Chest  radiograph dated 06/14/2022 FINDINGS: Lines/tubes: Right apical chest tube in similar position. Chest: Bibasilar linear opacities, slightly increased on the right, likely atelectasis. Pleura: Slightly increased small right pneumothorax. No left pneumothorax. Increased right chest wall subcutaneous emphysema. Crescentic lucency projecting over the lateral right lung  base/right upper quadrant. Heart/mediastinum: Similar  cardiomediastinal silhouette. Bones: No acute osseous abnormality. IMPRESSION: 1. Right apical chest tube in similar position. Slightly increased small right pneumothorax. 2. Increased right chest wall subcutaneous emphysema. 3. Crescentic lucency projecting over the lateral right lung base/right upper quadrant likely represents a combination of right basilar atelectasis and known pneumothorax, however pneumoperitoneum can have a similar appearance. If clinically feasible, consider left lateral decubitus radiograph. These results will be called to the ordering clinician or representative by the Radiologist Assistant, and communication documented in the PACS or [employer]. Electronically Signed   By: Darrin Nipper M.D.   On: 06/15/2022 08:13   DG Chest Port 1 View  Result Date: 06/14/2022 CLINICAL DATA:  Pneumothorax. EXAM: PORTABLE CHEST 1 VIEW COMPARISON:  05/13/2022 FINDINGS: Right chest tube with the tip near the right lung apex. No definite pneumothorax. Subcutaneous gas in the lateral right lower chest. Densities at the left lung base likely represent atelectasis. Negative for left pneumothorax. Heart size is stable. Atherosclerotic calcifications at the aortic arch. IMPRESSION: 1. Right chest tube without a definite pneumothorax. 2. Left basilar densities probably represent atelectasis. 3. Subcutaneous gas in the right chest. Electronically Signed   By: Markus Daft M.D.   On: 06/14/2022 12:37

## 2022-07-12 ENCOUNTER — Encounter: Payer: Self-pay | Admitting: Hematology

## 2022-07-12 ENCOUNTER — Inpatient Hospital Stay: Payer: PPO | Attending: Hematology | Admitting: Hematology

## 2022-07-12 VITALS — BP 156/93 | HR 88 | Temp 97.7°F | Resp 18 | Ht 65.5 in | Wt 120.0 lb

## 2022-07-12 DIAGNOSIS — F17210 Nicotine dependence, cigarettes, uncomplicated: Secondary | ICD-10-CM | POA: Insufficient documentation

## 2022-07-12 DIAGNOSIS — Z90710 Acquired absence of both cervix and uterus: Secondary | ICD-10-CM | POA: Diagnosis not present

## 2022-07-12 DIAGNOSIS — C3431 Malignant neoplasm of lower lobe, right bronchus or lung: Secondary | ICD-10-CM | POA: Insufficient documentation

## 2022-07-12 DIAGNOSIS — Z902 Acquired absence of lung [part of]: Secondary | ICD-10-CM | POA: Insufficient documentation

## 2022-07-12 DIAGNOSIS — E876 Hypokalemia: Secondary | ICD-10-CM | POA: Insufficient documentation

## 2022-07-12 DIAGNOSIS — Z452 Encounter for adjustment and management of vascular access device: Secondary | ICD-10-CM | POA: Diagnosis not present

## 2022-07-12 DIAGNOSIS — R634 Abnormal weight loss: Secondary | ICD-10-CM | POA: Diagnosis not present

## 2022-07-12 DIAGNOSIS — R059 Cough, unspecified: Secondary | ICD-10-CM | POA: Diagnosis not present

## 2022-07-12 DIAGNOSIS — C3490 Malignant neoplasm of unspecified part of unspecified bronchus or lung: Secondary | ICD-10-CM

## 2022-07-12 DIAGNOSIS — E860 Dehydration: Secondary | ICD-10-CM | POA: Diagnosis not present

## 2022-07-12 DIAGNOSIS — Z5111 Encounter for antineoplastic chemotherapy: Secondary | ICD-10-CM | POA: Insufficient documentation

## 2022-07-12 DIAGNOSIS — Z79899 Other long term (current) drug therapy: Secondary | ICD-10-CM | POA: Insufficient documentation

## 2022-07-12 DIAGNOSIS — Z8541 Personal history of malignant neoplasm of cervix uteri: Secondary | ICD-10-CM | POA: Insufficient documentation

## 2022-07-12 DIAGNOSIS — Z8349 Family history of other endocrine, nutritional and metabolic diseases: Secondary | ICD-10-CM | POA: Diagnosis not present

## 2022-07-12 DIAGNOSIS — Z90721 Acquired absence of ovaries, unilateral: Secondary | ICD-10-CM | POA: Diagnosis not present

## 2022-07-12 DIAGNOSIS — Z803 Family history of malignant neoplasm of breast: Secondary | ICD-10-CM | POA: Diagnosis not present

## 2022-07-12 DIAGNOSIS — Z923 Personal history of irradiation: Secondary | ICD-10-CM | POA: Insufficient documentation

## 2022-07-12 DIAGNOSIS — E871 Hypo-osmolality and hyponatremia: Secondary | ICD-10-CM | POA: Insufficient documentation

## 2022-07-12 DIAGNOSIS — Z5189 Encounter for other specified aftercare: Secondary | ICD-10-CM | POA: Diagnosis not present

## 2022-07-12 NOTE — Patient Instructions (Addendum)
[facility]  Discharge Instructions  You were seen and examined today by Dr. Delton Coombes.  Dr. Delton Coombes has reviewed the final report from your surgery. Your cancer was completely removed with surgery and there was no spread of the cancer to your lymph nodes! This is great news! However, the cancer was large in size (over 4cm). With that being said, it is recommended that you have 4 cycles of chemotherapy to prevent the cancer from returning.  Prior to the start of chemotherapy, you will need a Port-A-Cath.  Following the completion of chemotherapy, Dr. Delton Coombes may discuss the role for immunotherapy.  Follow-up as scheduled.  Thank you for choosing [facility] to provide your oncology and hematology care.   To afford each patient quality time with our provider, please arrive at least 15 minutes before your scheduled appointment time. You may need to reschedule your appointment if you arrive late (10 or more minutes). Arriving late affects you and other patients whose appointments are after yours.  Also, if you miss three or more appointments without notifying the office, you may be dismissed from the clinic at the provider's discretion.    Again, thank you for choosing [facility].  Our hope is that these requests will decrease the amount of time that you wait before being seen by our physicians.   If you have a lab appointment with the [facility] please come in thru the Main Entrance and check in at the main information desk.           _____________________________________________________________  Should you have questions after your visit to [facility], please contact our office at [phone] and follow the prompts.  Our office hours are 8:00 a.m. to 4:30 p.m. Monday - Thursday and 8:00 a.m. to 2:30 p.m. Friday.  Please note that voicemails left after 4:00 p.m. may not be returned until the  following business day.  We are closed weekends and all major holidays.  You do have access to a nurse 24-7, just call the main number to the clinic [phone] and do not press any options, hold on the line and a nurse will answer the phone.    For prescription refill requests, have your pharmacy contact our office and allow 72 hours.    Masks are optional in the cancer centers. If you would like for your care team to wear a mask while they are taking care of you, please let them know. You may have one support person who is at least 67 years old accompany you for your appointments.

## 2022-07-12 NOTE — Progress Notes (Signed)
START ON PATHWAY REGIMEN - Non-Small Cell Lung     A cycle is every 21 days:     Paclitaxel      Carboplatin   **Always confirm dose/schedule in your pharmacy ordering system**  Patient Characteristics: Postoperative without Neoadjuvant Therapy (Pathologic Staging), Stage II-III, Adjuvant Chemotherapy, Stage IIA, Squamous Cell Therapeutic Status: Postoperative without Neoadjuvant Therapy (Pathologic Staging) AJCC T Category: pT2b AJCC N Category: pN0 AJCC M Category: cM0 AJCC 8 Stage Grouping: IIA Adjuvant Chemotherapy Status: Adjuvant Chemotherapy ALK Rearrangement Status: Awaiting Test Results EGFR Mutation Status: Awaiting Test Results Histology: Squamous Cell Intent of Therapy: Curative Intent, Discussed with Patient

## 2022-07-13 ENCOUNTER — Other Ambulatory Visit: Payer: Self-pay

## 2022-07-15 ENCOUNTER — Other Ambulatory Visit: Payer: Self-pay

## 2022-07-17 ENCOUNTER — Other Ambulatory Visit: Payer: Self-pay

## 2022-07-20 ENCOUNTER — Other Ambulatory Visit: Payer: Self-pay | Admitting: Radiology

## 2022-07-21 ENCOUNTER — Ambulatory Visit (HOSPITAL_COMMUNITY)
Admission: RE | Admit: 2022-07-21 | Discharge: 2022-07-21 | Disposition: A | Discharge status: Home / Self Care | Payer: PPO | Source: Ambulatory Visit | Attending: Hematology | Admitting: Hematology

## 2022-07-21 ENCOUNTER — Other Ambulatory Visit: Payer: Self-pay

## 2022-07-21 ENCOUNTER — Encounter (HOSPITAL_COMMUNITY): Payer: Self-pay

## 2022-07-21 DIAGNOSIS — F32A Depression, unspecified: Secondary | ICD-10-CM | POA: Diagnosis not present

## 2022-07-21 DIAGNOSIS — F419 Anxiety disorder, unspecified: Secondary | ICD-10-CM | POA: Insufficient documentation

## 2022-07-21 DIAGNOSIS — C3491 Malignant neoplasm of unspecified part of right bronchus or lung: Secondary | ICD-10-CM | POA: Insufficient documentation

## 2022-07-21 DIAGNOSIS — F17210 Nicotine dependence, cigarettes, uncomplicated: Secondary | ICD-10-CM | POA: Diagnosis not present

## 2022-07-21 DIAGNOSIS — C3490 Malignant neoplasm of unspecified part of unspecified bronchus or lung: Secondary | ICD-10-CM

## 2022-07-21 DIAGNOSIS — I10 Essential (primary) hypertension: Secondary | ICD-10-CM | POA: Insufficient documentation

## 2022-07-21 HISTORY — PX: IR IMAGING GUIDED PORT INSERTION: IMG5740

## 2022-07-21 MED ORDER — LIDOCAINE-EPINEPHRINE 1 %-1:100000 IJ SOLN
INTRAMUSCULAR | Status: AC | PRN
  Administered 2022-07-21: 30 mL

## 2022-07-21 MED ORDER — MIDAZOLAM HCL 2 MG/2ML IJ SOLN
INTRAMUSCULAR | Status: AC | PRN
  Administered 2022-07-21: 1 mg via INTRAVENOUS

## 2022-07-21 MED ORDER — SODIUM CHLORIDE 0.9 % IV SOLN: INTRAVENOUS | Status: DC

## 2022-07-21 MED ORDER — FENTANYL CITRATE (PF) 100 MCG/2ML IJ SOLN
INTRAMUSCULAR | Status: AC
  Filled 2022-07-21: qty 2

## 2022-07-21 MED ORDER — HEPARIN SOD (PORK) LOCK FLUSH 100 UNIT/ML IV SOLN
INTRAVENOUS | Status: AC
  Filled 2022-07-21: qty 5

## 2022-07-21 MED ORDER — MIDAZOLAM HCL 2 MG/2ML IJ SOLN
INTRAMUSCULAR | Status: AC
  Filled 2022-07-21: qty 2

## 2022-07-21 MED ORDER — FENTANYL CITRATE (PF) 100 MCG/2ML IJ SOLN
INTRAMUSCULAR | Status: AC | PRN
  Administered 2022-07-21: 50 ug via INTRAVENOUS

## 2022-07-21 MED ORDER — LIDOCAINE-EPINEPHRINE 1 %-1:100000 IJ SOLN
INTRAMUSCULAR | Status: AC
  Filled 2022-07-21: qty 1

## 2022-07-21 NOTE — Procedures (Signed)
Interventional Radiology Procedure:   Indications: Right lung cancer  Procedure: Port placement  Findings: Right jugular port, tip at SVC/RA junction  Complications: None     EBL: Minimal, less than 10 ml  Plan: Discharge in one hour.  Keep port site and incisions dry for at least 24 hours.     Kemi Gell R. Abby Stines, MD  Pager: [phone]   

## 2022-07-21 NOTE — H&P (Signed)
Chief Complaint: Patient was seen in consultation today for lung cancer  Referring Physician(s): Katragadda,Sreedhar  Supervising Physician: Richarda Overlie  Patient Status: [name]  History of Present Illness: [name] is a 67 y.o. female with history of anxiety, arthritis, depression, HTN, extensive smoking history, with recent diagnosis of lung cancer now s/p right lower lobectomy and node dissection 06/14/22.  She has plans for upcoming chemotherapy and is in need of durable venous access.  She is referred to IR for Port-A-Cath placement.    [name] presents for procedure today in her usual state of health.  She has no new complaints or concerns and has healed as expected from recent surgery. She has been NPO.  She does not take blood thinners.  She is aware of the goals of the goals of the procedure today and is agreeable to proceed.  Her boyfriend is available for care and transportation today.   Past Medical History:  Diagnosis Date   Anxiety    Arthritis    spinal stenosis   Back disorder    "crooked spine"   Bulging lumbar disc    Cervical cancer    Complication of anesthesia    ? hypotension 10/2016 at [facility] surgery    Depression    Dyspnea    H/O degenerative disc disease    History of radiation therapy 02/22/17-03/22/17   vaginal cuff 30 Gy in 5 fractions   Hypertension    Hypothyroidism    patient taken off of hypothyroid med in 11/2016    Osteoporosis    Thyroid disease     Past Surgical History:  Procedure Laterality Date   ABDOMINAL EXPOSURE N/A 09/27/2018   Procedure: ABDOMINAL EXPOSURE;  Surgeon: Nada Libman, MD;  Location: [facility] OR;  Service: Vascular;  Laterality: N/A;   ABDOMINAL HYSTERECTOMY     ANTERIOR AND POSTERIOR REPAIR N/A 11/09/2016   Procedure: ANTERIOR (CYSTOCELE) AND POSTERIOR REPAIR (RECTOCELE);  Surgeon: Tilda Burrow, MD;  Location: [facility] ORS;  Service: Gynecology;  Laterality: N/A;   ANTERIOR LUMBAR FUSION Left  09/27/2018   Procedure: ANTERIOR LUMBAR FUSION L5-S1,;  Surgeon: Venita Lick, MD;  Location: [facility] OR;  Service: Orthopedics;  Laterality: Left;  4.5 HRS/ DR. Myra Gianotti ASSIST   BACK SURGERY     BILATERAL SALPINGECTOMY Left 11/09/2016   Procedure: LEFT SALPINGECTOMY;  Surgeon: Tilda Burrow, MD;  Location: [facility] ORS;  Service: Gynecology;  Laterality: Left;   BRONCHIAL BIOPSY  05/13/2022   Procedure: BRONCHIAL BIOPSIES;  Surgeon: Josephine Igo, DO;  Location: [facility] ENDOSCOPY;  Service: Pulmonary;;   BRONCHIAL BRUSHINGS  05/13/2022   Procedure: BRONCHIAL BRUSHINGS;  Surgeon: Josephine Igo, DO;  Location: [facility] ENDOS[facility];  Service: Pulmonary;;   BRONCHIAL NEEDLE ASPIRATION BIOPSY  05/13/2022   Procedure: BRONCHIAL NEEDLE ASPIRATION BIOPSIES;  Surgeon: Josephine Igo, DO;  Location: [facility];  Service: Pulmonary;;   CATARACT EXTRACTION W/PHACO Right 07/05/2016   Procedure: CATARACT EXTRACTION PHACO AND INTRAOCULAR LENS PLACEMENT (IOC);  Surgeon: Gemma Payor, MD;  Location: [facility] ORS;  Service: Ophthalmology;  Laterality: Right;  CDE: 15.41   CATARACT EXTRACTION W/PHACO Left 07/19/2016   Procedure: CATARACT EXTRACTION PHACO AND INTRAOCULAR LENS PLACEMENT LEFT EYE CDE= 12.65;  Surgeon: Gemma Payor, MD;  Location: [facility] ORS;  Service: Ophthalmology;  Laterality: Left;  left   COLONOSCOPY WITH PROPOFOL N/A 09/09/2021   Procedure: COLONOSCOPY WITH PROPOFOL;  Surgeon: Corbin Ade, MD;  Location: [facility] ENDO SUITE;  Service: Endoscopy;  Laterality: N/A;  10:30am   EYE SURGERY     FINE NEEDLE ASPIRATION  05/13/2022   Procedure: FINE NEEDLE ASPIRATION (FNA) LINEAR;  Surgeon: Josephine IgoIcard, Bradley L, DO;  Location: [facility] ENDOS[facility];  Service: Pulmonary;;   INTERCOSTAL NERVE BLOCK  06/14/2022   Procedure: INTERCOSTAL NERVE BLOCK;  Surgeon: Loreli SlotHendrickson, Steven C, MD;  [facility];  Service: Thoracic;;   LUMBAR LAMINECTOMY/DECOMPRESSION MICRODISCECTOMY Left 09/27/2018   Procedure: L4-L5 Lumbar Laminectomy/Decompression;  Surgeon:  Venita LickBrooks, Dahari, MD;  [facility];  Service: Orthopedics;  Laterality: Left;   NODE DISSECTION Right 06/14/2022   Procedure: NODE DISSECTION;  Surgeon: Loreli SlotHendrickson, Steven C, MD;  [facility];  Service: Thoracic;  Laterality: Right;   POLYPECTOMY  09/09/2021   Procedure: POLYPECTOMY;  Surgeon: Corbin Adeourk, Robert M, MD;  [facility];  Service: Endoscopy;;   ROBOTIC PELVIC AND PARA-AORTIC LYMPH NODE DISSECTION N/A 01/11/2017   Procedure: XI ROBOTIC BILATERAL PELVIC LYMPH NODE DISSECTION;  Surgeon: Adolphus Birchwoodossi, Emma, MD;  Location: [facility] ORS;  Service: Gynecology;  Laterality: N/A;   SALPINGOOPHORECTOMY Right 11/09/2016   Procedure: RIGHT SALPINGO OOPHORECTOMY;  Surgeon: Tilda BurrowFerguson, John V, MD;  Location: [facility] ORS;  Service: Gynecology;  Laterality: Right;   TUBAL LIGATION     VAGINAL HYSTERECTOMY N/A 11/09/2016   Procedure: HYSTERECTOMY VAGINAL;  Surgeon: Tilda BurrowFerguson, John V, MD;  Location: [facility] ORS;  Service: Gynecology;  Laterality: N/A;   VIDEO BRONCHOSCOPY WITH ENDOBRONCHIAL ULTRASOUND Right 05/13/2022   Procedure: VIDEO BRONCHOSCOPY WITH ENDOBRONCHIAL ULTRASOUND;  Surgeon: Josephine IgoIcard, Bradley L, DO;  Location: [facility];  Service: Pulmonary;  Laterality: Right;    Allergies: Zithromax [azithromycin] and Prednisone  Medications: Prior to Admission medications   Medication Sig Start Date End Date Taking? Authorizing Provider  acetaminophen (TYLENOL) 325 MG tablet Take 650 mg by mouth every 6 (six) hours as needed for headache.   Yes [provider]  albuterol (VENTOLIN HFA) 108 (90 Base) MCG/ACT inhaler INHALE 2 PUFFS INTO THE LUNGS EVERY 6 HOURS AS NEEDED FOR WHEEZING OR SHORTNESS OF BREATH. 05/05/22  Yes Icard, Bradley L, DO  alendronate (FOSAMAX) 70 MG tablet Take 70 mg by mouth once a week. 12/11/18  Yes [provider]  ALPRAZolam Prudy Feeler(XANAX) 1 MG tablet Take 1 mg by mouth at bedtime. May take additional 1 mg during the day if needed 09/24/19  Yes [provider]   atorvastatin (LIPITOR) 40 MG tablet Take 40 mg by mouth at bedtime.    Yes [provider]  benzonatate (TESSALON) 100 MG capsule Take 100 mg by mouth 3 (three) times daily as needed for cough.   Yes [provider]  FLUoxetine (PROZAC) 40 MG capsule Take 40 mg by mouth daily. 11/10/21  Yes [provider]  Fluticasone-Umeclidin-Vilant (TRELEGY ELLIPTA) 100-62.5-25 MCG/ACT AEPB Inhale 1 each into the lungs daily. 05/06/22  Yes Icard, Rachel BoBradley L, DO  hydrochlorothiazide (HYDRODIURIL) 25 MG tablet Take 1 tablet (25 mg total) by mouth daily. Patient taking differently: Take 25 mg by mouth daily as needed (if blood pressure still high after taking lisinopril). 03/15/22 03/10/23 Yes Mallipeddi, Vishnu P, MD  lisinopril (ZESTRIL) 10 MG tablet Take 10 mg by mouth daily as needed (blood pressure of 170/99).   Yes [provider]  oxyCODONE (OXY IR/ROXICODONE) 5 MG immediate release tablet Take 1 tablet (5 mg total) by mouth every 6 (six) hours as needed for moderate pain. 06/17/22  Yes Stehler, Oren BracketBailey C, PA-C  traZODone (DESYREL) 100 MG tablet Take 100 mg by mouth at bedtime. 11/17/19  Yes  [provider]     Family History  Problem Relation Age of Onset   Hypertension Mother    Congenital heart disease Mother    Diabetes Mother    Thyroid disease Brother    Other Daughter        bowel issues   Colon cancer Neg Hx    Colon polyps Neg Hx     Social History   Socioeconomic History   Marital status: Legally Separated    Spouse name: Not on file   Number of children: 1   Years of education: Not on file   Highest education level: Not on file  Occupational History   Not on file  Tobacco Use   Smoking status: Every Day    Packs/day: 1.00    Years: 44.00    Additional pack years: 0.00    Total pack years: 44.00    Types: Cigarettes   Smokeless tobacco: Never   Tobacco comments:    Smokes 7 packs of cigarettes a week. 05/05/2022 Tay  Vaping Use   Vaping  Use: Never used  Substance and Sexual Activity   Alcohol use: No    Comment: 01-06-2016 Per pt rarely, 02-05-2016 per pt no but 2yrs ago     Drug use: No    Comment: 02-05-2016 per pt no but about 40 yrs ago   Sexual activity: Not Currently    Birth control/protection: Surgical    Comment: hyst  Other Topics Concern   Not on file  Social History Narrative   Not on file   Social Determinants of Health   Financial Resource Strain: Unknown (01/20/2021)   Overall Financial Resource Strain (CARDIA)    Difficulty of Paying Living Expenses: Patient declined  Food Insecurity: No Food Insecurity (05/04/2022)   Hunger Vital Sign    Worried About Running Out of Food in the Last Year: Never true    Ran Out of Food in the Last Year: Never true  Transportation Needs: No Transportation Needs (05/04/2022)   PRAPARE - Administrator, Civil Service (Medical): No    Lack of Transportation (Non-Medical): No  Physical Activity: Inactive (01/20/2021)   Exercise Vital Sign    Days of Exercise per Week: 1 day    Minutes of Exercise per Session: 0 min  Stress: Stress Concern Present (01/20/2021)   [employer] of Occupational Health - Occupational Stress Questionnaire    Feeling of Stress : Very much  Social Connections: Moderately Isolated (01/20/2021)   Social Connection and Isolation Panel [NHANES]    Frequency of Communication with Friends and Family: Twice a week    Frequency of Social Gatherings with Friends and Family: Never    Attends Religious Services: More than 4 times per year    Active Member of [employer] or Organizations: No    Attends Banker Meetings: Never    Marital Status: Married     Review of Systems: A 12 point ROS discussed and pertinent positives are indicated in the HPI above.  All other systems are negative.  Review of Systems  Constitutional:  Negative for fatigue and fever.  Respiratory:  Negative for cough and shortness of breath.    Cardiovascular:  Negative for chest pain.  Gastrointestinal:  Negative for abdominal pain, nausea and vomiting.  Musculoskeletal:  Negative for back pain.  Psychiatric/Behavioral:  Negative for behavioral problems and confusion.     Vital Signs: BP (!) 164/80   Pulse 89   Temp 98.2 F (36.8  C) (Oral)   Resp 16   Ht 5\' 6"  (1.676 m)   Wt 120 lb (54.4 kg)   SpO2 96%   BMI 19.37 kg/m   Physical Exam Vitals and nursing note reviewed.  Constitutional:      General: She is not in acute distress.    Appearance: Normal appearance. She is not ill-appearing.  HENT:     Mouth/Throat:     Mouth: Mucous membranes are moist.     Pharynx: Oropharynx is clear.  Cardiovascular:     Rate and Rhythm: Normal rate and regular rhythm.  Pulmonary:     Effort: Pulmonary effort is normal.     Breath sounds: Normal breath sounds.  Abdominal:     General: Abdomen is flat.     Palpations: Abdomen is soft.  Musculoskeletal:     Cervical back: Normal range of motion.  Skin:    General: Skin is warm and dry.  Neurological:     General: No focal deficit present.     Mental Status: She is alert and oriented to person, place, and time. Mental status is at baseline.  Psychiatric:        Mood and Affect: Mood normal.        Behavior: Behavior normal.        Thought Content: Thought content normal.        Judgment: Judgment normal.      MD Evaluation Airway: WNL Heart: WNL Abdomen: WNL Chest/ Lungs: WNL ASA  Classification: 3 Mallampati/Airway Score: Two   Imaging: DG Chest 2 View  Result Date: 06/29/2022 CLINICAL DATA:  Status post right lower lobectomy. EXAM: CHEST - 2 VIEW COMPARISON:  Chest x-ray dated June 17, 2022 FINDINGS: Cardiac and mediastinal contours within normal limits. Postsurgical changes of right lower lobectomy. Small right pleural effusion. Left basilar opacity which is new when compared with prior radiograph. No evidence of pneumothorax. IMPRESSION: 1. New left basilar  opacity, concerning for infection or aspiration. 2. Postsurgical changes of right lower lobectomy. 3. Small right pleural effusion. Electronically Signed   By: Allegra Lai M.D.   On: 06/29/2022 14:20    Labs:  CBC: Recent Labs    06/10/22 1450 06/14/22 0828 06/15/22 0025 06/16/22 0016 06/17/22 0020  WBC 13.5*  --  15.9* 13.5* 12.8*  HGB 12.8 9.9* 10.3* 9.9* 9.4*  HCT 40.1 29.0* 31.9* 31.2* 30.0*  PLT 445*  --  303 284 273    COAGS: Recent Labs    06/10/22 1450  INR 1.0  APTT 28    BMP: Recent Labs    06/10/22 1450 06/14/22 0828 06/15/22 0025 06/16/22 0016 06/17/22 0020  NA 136 139 134* 135 135  K 3.4* 3.3* 3.8 3.4* 3.9  CL 97*  --  100 102 104  CO2 27  --  26 29 27   GLUCOSE 100*  --  134* 130* 128*  BUN 9  --  8 13 14   CALCIUM 9.2  --  8.3* 7.9* 7.8*  CREATININE 0.97  --  0.87 1.31* 1.12*  GFRNONAA >60  --  >60 45* 54*    LIVER FUNCTION TESTS: Recent Labs    06/10/22 1450 06/16/22 0016  BILITOT 0.7 0.2*  AST 15 19  ALT 10 9  ALKPHOS 100 67  PROT 7.3 5.0*  ALBUMIN 3.5 2.5*    TUMOR MARKERS: No results for input(s): "AFPTM", "CEA", "CA199", "CHROMGRNA" in the last 8760 hours.  Assessment and Plan: Patient with past medical history of HTN, prior  smoker, newly diagnosed lung cancer presents in need of durable venous access for chemotherapy.  IR consulted for Port-A-Cath placement at the request of Dr. Ellin Saba. Case reviewed by Dr. Lowella Dandy who approves patient for procedure.  Patient presents today in their usual state of health.  He has been NPO and is not currently on blood thinners.   Risks and benefits of image guided port-a-catheter placement was discussed with the patient including, but not limited to bleeding, infection, pneumothorax, or fibrin sheath development and need for additional procedures.  All of the patient's questions were answered, patient is agreeable to proceed. Consent signed and in chart.   Thank you for this  interesting consult.  I greatly enjoyed meeting [name] and look forward to participating in their care.  A copy of this report was sent to the requesting provider on this date.  Electronically Signed: Hoyt Koch, PA 07/21/2022, 11:27 AM   I spent a total of  30 Minutes   in face to face in clinical consultation, greater than 50% of which was counseling/coordinating care for lung cancer.

## 2022-07-22 ENCOUNTER — Inpatient Hospital Stay: Payer: PPO

## 2022-07-22 DIAGNOSIS — C3431 Malignant neoplasm of lower lobe, right bronchus or lung: Secondary | ICD-10-CM

## 2022-07-22 MED ORDER — LIDOCAINE-PRILOCAINE 2.5-2.5 % EX CREA: TOPICAL_CREAM | CUTANEOUS | 3 refills | Status: DC

## 2022-07-22 MED ORDER — PROCHLORPERAZINE MALEATE 10 MG PO TABS
10.0000 mg | ORAL_TABLET | Freq: Four times a day (QID) | ORAL | 1 refills | Status: DC | PRN
Start: 2022-07-22 — End: 2022-11-03

## 2022-07-22 NOTE — Patient Instructions (Addendum)
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 You will be treated in the clinic every 3 weeks for a total of 4 times (cycles) with a combination of chemotherapy drugs. Those drugs are Taxotere and carboplatin. You will also come for a white blood cell booster shot about 48 hours after each treatment, as this chemotherapy regimen can cause your white blood cells to drop. Your white blood cells help your body fight against infection. The booster shot prevents these cells from dropping too low. The intent of treatment is curative and to prevent the cancer from coming back.  You will see the doctor regularly throughout treatment. We will obtain blood work from you prior to every treatment and monitor your results to make sure it is safe to give your treatment. The doctor monitors your response to treatment by the way you are feeling, your blood work, and by obtaining scans periodically. There will be wait times while you are here for treatment.  It will take about 30 minutes to 1 hour for your lab work to result.  Then there will be wait times while pharmacy mixes your medications.     Medications you will receive in the clinic prior to your chemotherapy medications:  Aloxi:  ALOXI is used in adults to help prevent nausea and vomiting that happens with certain chemotherapy drugs.  Aloxi is a long acting medication, and will remain in your system for about two days.   Emend:  This is an anti-nausea medication that is used with Aloxi to help prevent nausea and vomiting caused by chemotherapy.  Dexamethasone:  This is a steroid given prior to chemotherapy to help prevent allergic reactions; it may also help prevent and control nausea and diarrhea.    Neulasta (or similar) - this medication is not chemotherapy but is being given because you have had chemo. It is usually given 24-48 hours after the completion of  chemotherapy. This medication works by boosting your bone marrow's supply of white blood cells. White blood cells are what protect our bodies against infection. The medication is given in the form of a subcutaneous injection. It is given in the fatty tissue of your abdomen, or in the skin on the back of your arm. It is a short needle. The major side effect of this medication is bone or muscle pain. The drug of choice to relieve or lessen the pain is Aleve or Ibuprofen. If a physician has ever told you not to take Aleve or Ibuprofen - then don't take it. You should then take Tylenol/acetaminophen. Take either medication as the bottle directs you to.  The level of pain you experience as a result of this injection can range from none, to mild or moderate, or severe. Please let us know if you develop moderate or severe bone pain. You can take Claritin 10 mg over the counter for a few days after receiving Neulasta to help with the bone aches and pains.          Docetaxel (Taxotere)    About This Drug    Docetaxel is used to treat cancer. It is given in the vein (IV). It will take 1 hour to infuse.  The first infusion will take longer (about 1.5 hours) due to the fact that we start this drug at a very slow rate and gradually increase the rate of infusion until the maximum rate is reached.  This is  done to monitor for infusion reactions, and to make sure you will tolerated this drug without adverse effects.  If you tolerate the first infusion, all subsequent infusions will be started at the normal rate and given over 1 hour.   Possible Side Effects     Bone marrow suppression. This is a decrease in the number of white blood cells, red blood cells, and platelets. This may raise your risk of infection, make you tired and weak, and raise your risk of bleeding.     Neutropenic fever. A type of fever that can develop when you have a very low number of white blood cells which can be life-threatening.     Soreness  of the mouth and throat. You may have red areas, white patches, or sores that hurt.     Nausea and vomiting (throwing up)     Constipation (not able to move bowels)     Diarrhea (loose bowel movements)     Infections     Fluid retention - swelling of the hands, feet, or any other part of the body. Fluid may build-up around your lungs and/or heart.     Changes in the way food and drinks taste     Effects on the nerves are called peripheral neuropathy. You may feel numbness, tingling, or pain in your hands and feet. It may be hard for you to button your clothes, open jars, or walk as usual. The effect on the nerves may get worse with more doses of the drug. These effects get better in some people after the drug is stopped but it does not get better in all people.     Decreased appetite (decreased hunger)     Weakness     Pain     Muscle pain/aching     Trouble breathing     Changes in your nail color, you may have nail loss and/or brittle nail     Hair loss. Hair loss is often temporary, although there have been cases of permanent hair loss reported. Hair loss may happen suddenly or gradually. If you lose hair, you may lose it from your head, face, armpits, pubic area, chest, and/or legs. You may also notice your hair getting thin.     Allergic skin reaction. You may develop blisters on your skin that are filled with fluid or a severe red rash all over your body that may be painful.     Allergic reactions, including anaphylaxis are rare but may happen in some patients. Signs of allergic reaction to this drug may be swelling of the face, feeling like your tongue or throat are swelling, trouble breathing, rash, itching, fever, chills, feeling dizzy, and/or feeling that your heart is beating in a fast or not normal way. If this happens, do not take another dose of this drug. You should get urgent medical treatment.    Note: Not all possible side effects are included above.    Warnings  and Precautions     Severe bone marrow suppression, including febrile neutropenia which can be life-threatening     Severe allergic reactions, including anaphylaxis which can be life-threatening  Colitis, which is swelling (inflammation) in the colon - symptoms are diarrhea (loose bowel movements), stomach cramping, and sometimes blood in the bowel movements  Severe skin reactions, including redness, swelling, or peeling of skin  Severe swelling in the eye or other changes in eyesight     Severe fluid retention     If you have a  history of abnormal liver function, receive high doses of docetaxel, or have a history of lung cancer and have received treatment with a platinum (type of chemotherapy medication), you have an increased risk of death.     Severe weakness     This drug may raise your risk of getting a second cancer such as leukemia, lymphoma, myelodysplastic syndrome, and kidney cancer.     Severe peripheral neuropathy     This drug contains alcohol and may affect your central nervous system. The central nervous system is made up of your brain and spinal cord. You may feel dizzy and very sleepy.     Tumor lysis syndrome: This drug may act on the cancer cells very quickly. This may affect how your kidneys work. Note: Some of the side effects above are very rare. If you have concerns and/or questions, please discuss them with your medical team. Important Information     This drug may be present in the saliva, tears, sweat, urine, stool, vomit, semen, and vaginal secretions. Talk to your doctor and/or your nurse about the necessary precautions to take during this time.     This drug may impair your ability to drive or use machinery. Use caution and tell your nurse or doctor if you feel dizzy, very sleepy, and/or experience low blood pressure. Treating Side Effects     Manage tiredness by pacing your activities for the day.     Be sure to include periods of rest between energy-draining  activities.     Get regular exercise. If you feel too tired to exercise vigorously, try taking a short walk.      To decrease the risk of infection, wash your hands regularly.     Avoid close contact with people who have a cold, the flu, or other infections.     Take your temperature as your doctor or nurse tells you, and whenever you feel like you may have a fever.     To help decrease the risk of bleeding, use a soft toothbrush. Check with your nurse before using dental floss.     Be very careful when using knives or tools.     Use an electric shaver instead of a razor.     Mouth care is very important and will help food taste better and improve your appetite. Your mouth care should consist of routine, gentle cleaning of your teeth or dentures and rinsing your mouth with a mixture of 1/2 teaspoon of salt in 8 ounces of water or 1/2 teaspoon of baking soda in 8 ounces of water. This should be done at least after each meal and at bedtime.     If you have mouth sores, avoid mouthwash that has alcohol. Also avoid alcohol and smoking because they can bother your mouth and throat.     Taking good care of your mouth may help food taste better and improve your appetite     Drink plenty of fluids (a minimum of eight glasses per day is recommended).     If you throw up or have diarrhea, you should drink more fluids so that you do not become dehydrated (lack of water in the body from losing too much fluid).     If you have diarrhea, eat low-fiber foods that are high in protein and calories and avoid foods that can irritate your digestive tracts or lead to cramping.     If you are not able to move your bowels, check with your doctor  or nurse before you use enemas, laxatives, or suppositories.     Ask your doctor or nurse about medicines that are available to help stop or lessen constipation and/ or diarrhea.     To help with nausea and vomiting, eat small, frequent meals instead of three large  meals a day. Choose foods and drinks that are at room temperature. Ask your nurse or doctor about other helpful tips and medicine that is available to help stop or lessen these symptoms.     To help with decreased appetite, eat foods high in calories and protein, such as meat, poultry, fish, dry beans, tofu, eggs, nuts, milk, yogurt, cheese, ice cream, pudding, and nutritional supplements.     Consider using sauces and spices to increase taste. Daily exercise, with your doctor's approval, may increase your appetite.     Keeping your pain under control is important to your well-being. Please tell your doctor or nurse if you are experiencing pain.     If you get a rash do not put anything on it unless your doctor or nurse says you may. Keep the area around the rash clean and dry. Ask your doctor for medicine if your rash bothers you.     Keeping your nails moisturized may help with brittleness.     To help with hair loss, wash with a mild shampoo and avoid washing your hair every day. Avoid coloring your hair.     Avoid rubbing your scalp, pat your hair or scalp dry.     Limit your use of hair spray, electric curlers, blow dryers, and curling irons.     If you are interested in getting a wig, talk to your nurse and they can help you get in touch with programs in your local area.     If you have numbness and tingling in your hands and feet, be careful when cooking, walking, and handling sharp objects and hot liquids.    Food and Drug Interactions     This drug may interact with grapefruit and grapefruit juice. Talk to your doctor as this could make side effects worse.     This drug may interact with other medicines. Tell your doctor and pharmacist about all the prescription and over-the-counter medicines and dietary supplements (vitamins, minerals, herbs, and others) that you are taking at this time. Also, check with your doctor or pharmacist before starting any new prescription or  over-the-counter medicines, or dietary supplements to make sure that there are no interactions.     This drug may interact with St. John's Wort and may lower the levels of the drug in your body, which can make it less effective. When to Call the Doctor Call your doctor or nurse if you have any of these symptoms and/or any new or unusual symptoms:     Fever of 100.4 F (38 C) or higher     Chills     Blurred vision or other changes in eyesight, excessive tearing     Symptoms of being drunk, confusion, or being very sleepy     Feeling dizzy or lightheaded   Tiredness and/or weakness that interferes with your daily activities  Easy bruising or bleeding     Wheezing and/or trouble breathing     Chest pain     Pain in your mouth or throat that makes it hard to eat or drink     Nausea that stops you from eating or drinking and/or is not relieved by prescribed medicines  Throwing up more than 3 times a day     Lasting loss of appetite or rapid weight loss of five pounds in a week     Diarrhea, 4 times in one day or diarrhea with lack of strength or a feeling of being dizzy     No bowel movement in 3 days or when you feel uncomfortable     Pain in your abdomen that does not go away     Blood in your stool     Swelling of the hands, feet, or any other part of the body     Weight gain of 5 pounds in one week (fluid retention)     New rash and/or itching  Rash that is not relieved by prescribed medicines     Signs of inflammation/infection (redness, swelling, pain) of the tissue around your nails.     Signs of allergic reaction: swelling of the face, feeling like your tongue or throat are swelling, trouble breathing, rash, itching, fever, chills, feeling dizzy, and/or feeling that your heart is beating in a fast or not normal way. If this happens, call 911 for emergency care.     Flu-like symptoms: fever, headache, muscle and joint aches, and fatigue (low energy, feeling weak)      Signs of possible liver problems: dark urine, pale bowel movements, pain in your abdomen, feeling very tired and weak, unusual itching, or yellowing of the eyes or skin     Numbness, tingling, or pain in your hands and feet     Signs of tumor lysis: confusion or agitation, decreased urine, nausea/vomiting, diarrhea, muscle cramping, numbness and/or tingling, seizures.     General pain that does not go away or is not relieved by prescribed medicine     If you think you may be pregnant or have impregnated your partner    Reproduction Warnings     Pregnancy warning: This drug can have harmful effects on the unborn baby. Women of childbearing potential should use effective methods of birth control during your cancer treatment and for 6 months after stopping treatment. Men with female partners of childbearing potential should use effective methods of birth control during your cancer treatment and for 3 months after stopping treatment. Let your doctor know right away if you think you may be pregnant or may have impregnated your partner.     Breastfeeding warning: Women should not breastfeed during treatment and for 1 week after stopping treatment because this drug could enter the breast milk and cause harm to a breastfeeding baby.     Fertility warning: In men, this drug may affect your ability to have children in the future. Talk with your doctor or nurse if you plan to have children. Ask for information on sperm banking.    Carboplatin (Paraplatin, CBDCA)  About This Drug  Carboplatin is used to treat cancer. It is given in the vein (IV).  It will take 30 minutes to infuse.   Possible Side Effects   Bone marrow suppression. This is a decrease in the number of white blood cells, red blood cells, and platelets. This may raise your risk of infection, make you tired and weak (fatigue), and raise your risk of bleeding.   Nausea and vomiting (throwing up)   Weakness   Changes in your  liver function   Changes in your kidney function   Electrolyte changes   Pain  Note: Each of the side effects above was reported in 20% or greater of patients  treated with carboplatin. Not all possible side effects are included above.   Warnings and Precautions   Severe bone marrow suppression   Allergic reactions, including anaphylaxis are rare but may happen in some patients. Signs of allergic reaction to this drug may be swelling of the face, feeling like your tongue or throat are swelling, trouble breathing, rash, itching, fever, chills, feeling dizzy, and/or feeling that your heart is beating in a fast or not normal way. If this happens, do not take another dose of this drug. You should get urgent medical treatment.   Severe nausea and vomiting   Effects on the nerves are called peripheral neuropathy. This risk is increased if you are over the age of 84 or if you have received other medicine with risk of peripheral neuropathy. You may feel numbness, tingling, or pain in your hands and feet. It may be hard for you to button your clothes, open jars, or walk as usual. The effect on the nerves may get worse with more doses of the drug. These effects get better in some people after the drug is stopped but it does not get better in all people.   Blurred vision, loss of vision or other changes in eyesight   Decreased hearing   - Skin and tissue irritation including redness, pain, warmth, or swelling at the IV site if the drug leaks out of the vein and into nearby tissue.   Severe changes in your kidney function, which can cause kidney failure   Severe changes in your liver function, which can cause liver failure  Note: Some of the side effects above are very rare. If you have concerns and/or questions, please discuss them with your medical team.   Important Information   This drug may be present in the saliva, tears, sweat, urine, stool, vomit, semen, and vaginal secretions. Talk to  your doctor and/or your nurse about the necessary precautions to take during this time.   Treating Side Effects   Manage tiredness by pacing your activities for the day.   Be sure to include periods of rest between energy-draining activities.   To decrease the risk of infection, wash your hands regularly.   Avoid close contact with people who have a cold, the flu, or other infections.   Take your temperature as your doctor or nurse tells you, and whenever you feel like you may have a fever.   To help decrease the risk of bleeding, use a soft toothbrush. Check with your nurse before using dental floss.   Be very careful when using knives or tools.   Use an electric shaver instead of a razor.   Drink plenty of fluids (a minimum of eight glasses per day is recommended).   If you throw up or have loose bowel movements, you should drink more fluids so that you do not become dehydrated (lack of water in the body from losing too much fluid).   To help with nausea and vomiting, eat small, frequent meals instead of three large meals a day. Choose foods and drinks that are at room temperature. Ask your nurse or doctor about other helpful tips and medicine that is available to help stop or lessen these symptoms.   If you have numbness and tingling in your hands and feet, be careful when cooking, walking, and handling sharp objects and hot liquids.   Keeping your pain under control is important to your well-being. Please tell your doctor or nurse if you are experiencing pain.  Food and Drug Interactions   There are no known interactions of carboplatin with food.   This drug may interact with other medicines. Tell your doctor and pharmacist about all the prescription and over-the-counter medicines and dietary supplements (vitamins, minerals, herbs and others) that you are taking at this time. Also, check with your doctor or pharmacist before starting any new prescription or over-the-counter  medicines, or dietary supplements to make sure that there are no interactions.   When to Call the Doctor  Call your doctor or nurse if you have any of these symptoms and/or any new or unusual symptoms:   Fever of 100.4 F (38 C) or higher   Chills   Tiredness that interferes with your daily activities   Feeling dizzy or lightheaded   Easy bleeding or bruising   Nausea that stops you from eating or drinking and/or is not relieved by prescribed medicines   Throwing up   Blurred vision or other changes in eyesight   Decrease in hearing or ringing in the ear   Signs of allergic reaction: swelling of the face, feeling like your tongue or throat are swelling, trouble breathing, rash, itching, fever, chills, feeling dizzy, and/or feeling that your heart is beating in a fast or not normal way. If this happens, call 911 for emergency care.   Signs of possible liver problems: dark urine, pale bowel movements, bad stomach pain, feeling very tired and weak, unusual itching, or yellowing of the eyes or skin   Decreased urine, or very dark urine   Numbness, tingling, or pain in your hands and feet   Pain that does not go away or is not relieved by prescribed medicine   While you are getting this drug, please tell your nurse right away if you have any pain, redness, or swelling at the site of the IV infusion, or if you have any new onset of symptoms, or if you just feel "different" from before when the infusion was started.   Reproduction Warnings   Pregnancy warning: This drug may have harmful effects on the unborn baby. Women of child bearing potential should use effective methods of birth control during your cancer treatment. Let your doctor know right away if you think you may be pregnant.   Breastfeeding warning: It is not known if this drug passes into breast milk. For this reason, women should not breastfeed during treatment because this drug could enter the breast milk  and cause harm to a breastfeeding baby.   Fertility warning: Human fertility studies have not been done with this drug. Talk with your doctor or nurse if you plan to have children. Ask for information on sperm or egg banking.  SELF CARE ACTIVITIES WHILE ON CHEMOTHERAPY/IMMUNOTHERAPY:  Hydration Increase your fluid intake and drink at least 64 ounces (2 liters) of water/decaffeinated beverages per day after treatment. You can still have your cup of coffee or soda but these beverages do not count as part of the 64 ounces that you need to drink daily. Limit alcohol intake.  Medications Continue taking your normal prescription medication as prescribed.  If you start any new herbal or new supplements please let us know first to make sure it is safe.  Mouth Care Have teeth cleaned professionally before starting treatment. Keep dentures and partial plates clean. Use soft toothbrush and do not use mouthwashes that contain alcohol. Biotene is a good mouthwash that is available at most pharmacies or may be ordered by calling [phone]. Use warm salt  water gargles (1 teaspoon salt per 1 quart warm water) before and after meals and at bedtime. If you are still having problems with your mouth or sores in your mouth please call the clinic. If you need dental work, please let the doctor know before you go for your appointment so that we can coordinate the best possible time for you in regards to your chemo regimen. You need to also let your dentist know that you are actively taking chemo. We may need to do labs prior to your dental appointment.  Skin Care Always use sunscreen that has not expired and with SPF (Sun Protection Factor) of 50 or higher. Wear hats to protect your head from the sun. Remember to use sunscreen on your hands, ears, face, & feet.  Use good moisturizing lotions such as udder cream, eucerin, or even Vaseline. Some chemotherapies can cause dry skin, color changes in your skin and nails.     Avoid long, hot showers or baths. Use gentle, fragrance-free soaps and laundry detergent. Use moisturizers, preferably creams or ointments rather than lotions because the thicker consistency is better at preventing skin dehydration. Apply the cream or ointment within 15 minutes of showering. Reapply moisturizer at night, and moisturize your hands every time after you wash them.   Infection Prevention Please wash your hands for at least 30 seconds using warm soapy water. Handwashing is the #1 way to prevent the spread of germs. Stay away from sick people or people who are getting over a cold. If you develop respiratory systems such as green/yellow mucus production or productive cough or persistent cough let us know and we will see if you need an antibiotic. It is a good idea to keep a pair of gloves on when going into grocery stores/[employer] to decrease your risk of coming into contact with germs on the carts, etc. Carry alcohol hand gel with you at all times and use it frequently if out in public. If your temperature reaches 100.5 or higher please call the clinic and let us know.  If it is after hours or on the weekend please go to the ER if your temperature is over 100.4.  Please have your own personal thermometer at home to use.    Sex and bodily fluids If you are going to have sex, a condom must be used to protect the person that isn't taking immunotherapy. For a few days after treatment, immunotherapy can be excreted through your bodily fluids.  When using the toilet please close the lid and flush the toilet twice.  Do this for a few day after you have had immunotherapy.   Contraception It is not known for sure whether or not immunotherapy drugs can be passed on through semen or secretions from the vagina. Because of this some doctors advise people to use a barrier method if you have sex during treatment. This applies to vaginal, anal or oral sex.  Generally, doctors advise a barrier method only  for the time you are actually having the treatment and for about a week after your treatment.  Advice like this can be worrying, but this does not mean that you have to avoid being intimate with your partner. You can still have close contact with your partner and continue to enjoy sex.  Animals If you have cats or birds we ask that you not change the litter or change the cage.  Please have someone else do this for you while you are on immunotherapy.   Food  Safety During and After Cancer Treatment Food safety is important for people both during and after cancer treatment. Cancer and cancer treatments, such as chemotherapy, radiation therapy, and stem cell/bone marrow transplantation, often weaken the immune system. This makes it harder for your body to protect itself from foodborne illness, also called food poisoning. Foodborne illness is caused by eating food that contains harmful bacteria, parasites, or viruses.  Foods to avoid Some foods have a higher risk of becoming tainted with bacteria. These include: Unwashed fresh fruit and vegetables, especially leafy vegetables that can hide dirt and other contaminants Raw sprouts, such as alfalfa sprouts Raw or undercooked beef, especially ground beef, or other raw or undercooked meat and poultry Fatty, fried, or spicy foods immediately before or after treatment.  These can sit heavy on your stomach and make you feel nauseous. Raw or undercooked shellfish, such as oysters. Sushi and sashimi, which often contain raw fish.  Unpasteurized beverages, such as unpasteurized fruit juices, raw milk, raw yogurt, or cider Undercooked eggs, such as soft boiled, over easy, and poached; raw, unpasteurized eggs; or foods made with raw egg, such as homemade raw cookie dough and homemade mayonnaise  Simple steps for food safety  Shop smart. Do not buy food stored or displayed in an unclean area. Do not buy bruised or damaged fruits or vegetables. Do not buy cans  that have cracks, dents, or bulges. Pick up foods that can spoil at the end of your shopping trip and store them in a cooler on the way home.  Prepare and clean up foods carefully. Rinse all fresh fruits and vegetables under running water, and dry them with a clean towel or paper towel. Clean the top of cans before opening them. After preparing food, wash your hands for 20 seconds with hot water and soap. Pay special attention to areas between fingers and under nails. Clean your utensils and dishes with hot water and soap. Disinfect your kitchen and cutting boards using 1 teaspoon of liquid, unscented bleach mixed into 1 quart of water.    Dispose of old food. Eat canned and packaged food before its expiration date (the "use by" or "best before" date). Consume refrigerated leftovers within 3 to 4 days. After that time, throw out the food. Even if the food does not smell or look spoiled, it still may be unsafe. Some bacteria, such as Listeria, can grow even on foods stored in the refrigerator if they are kept for too long.  Take precautions when eating out. At restaurants, avoid buffets and salad bars where food sits out for a long time and comes in contact with many people. Food can become contaminated when someone with a virus, often a norovirus, or another "bug" handles it. Put any leftover food in a "to-go" container yourself, rather than having the server do it. And, refrigerate leftovers as soon as you get home. Choose restaurants that are clean and that are willing to prepare your food as you order it cooked.    SYMPTOMS TO REPORT AS SOON AS POSSIBLE AFTER TREATMENT:  FEVER GREATER THAN 100.4 F CHILLS WITH OR WITHOUT FEVER NAUSEA AND VOMITING THAT IS NOT CONTROLLED WITH YOUR NAUSEA MEDICATION UNUSUAL SHORTNESS OF BREATH UNUSUAL BRUISING OR BLEEDING TENDERNESS IN MOUTH AND THROAT WITH OR WITHOUT PRESENCE OF ULCERS URINARY PROBLEMS BOWEL PROBLEMS UNUSUAL RASH     Wear  comfortable clothing and clothing appropriate for easy access to any Portacath or PICC line. Let us know if there is anything that  we can do to make your therapy better!   What to do if you need assistance after hours or on the weekends: CALL [phone].  HOLD on the line, do not hang up.  You will hear multiple messages but at the end you will be connected with a nurse triage line.  They will contact the doctor if necessary.  Most of the time they will be able to assist you.  Do not call the hospital operator.    I have been informed and understand all of the instructions given to me and have received a copy. I have been instructed to call the clinic [phone] or my family physician as soon as possible for continued medical care, if indicated. I do not have any more questions at this time but understand that I may call the Cancer Center or the Patient Navigator at [phone] during office hours should I have questions or need assistance in obtaining follow-up care.

## 2022-07-22 NOTE — Progress Notes (Signed)

## 2022-07-26 ENCOUNTER — Inpatient Hospital Stay: Payer: PPO

## 2022-07-26 VITALS — BP 139/87 | HR 62 | Temp 98.1°F | Resp 18

## 2022-07-26 DIAGNOSIS — C3431 Malignant neoplasm of lower lobe, right bronchus or lung: Secondary | ICD-10-CM

## 2022-07-26 DIAGNOSIS — Z5111 Encounter for antineoplastic chemotherapy: Secondary | ICD-10-CM | POA: Diagnosis not present

## 2022-07-26 LAB — CBC WITH DIFFERENTIAL/PLATELET
Abs Immature Granulocytes: 0.04 10*3/uL (ref 0.00–0.07)
Basophils Absolute: 0.1 10*3/uL (ref 0.0–0.1)
Basophils Relative: 1 %
Eosinophils Absolute: 0.5 10*3/uL (ref 0.0–0.5)
Eosinophils Relative: 6 %
HCT: 39.1 % (ref 36.0–46.0)
Hemoglobin: 12.7 g/dL (ref 12.0–15.0)
Immature Granulocytes: 1 %
Lymphocytes Relative: 19 %
Lymphs Abs: 1.6 10*3/uL (ref 0.7–4.0)
MCH: 27.8 pg (ref 26.0–34.0)
MCHC: 32.5 g/dL (ref 30.0–36.0)
MCV: 85.6 fL (ref 80.0–100.0)
Monocytes Absolute: 0.5 10*3/uL (ref 0.1–1.0)
Monocytes Relative: 5 %
Neutro Abs: 5.7 10*3/uL (ref 1.7–7.7)
Neutrophils Relative %: 68 %
Platelets: 262 10*3/uL (ref 150–400)
RBC: 4.57 MIL/uL (ref 3.87–5.11)
RDW: 15.3 % (ref 11.5–15.5)
WBC: 8.5 10*3/uL (ref 4.0–10.5)
nRBC: 0 % (ref 0.0–0.2)

## 2022-07-26 LAB — COMPREHENSIVE METABOLIC PANEL
ALT: 11 U/L (ref 0–44)
AST: 15 U/L (ref 15–41)
Albumin: 3.6 g/dL (ref 3.5–5.0)
Alkaline Phosphatase: 93 U/L (ref 38–126)
Anion gap: 9 (ref 5–15)
BUN: 16 mg/dL (ref 8–23)
CO2: 26 mmol/L (ref 22–32)
Calcium: 9.1 mg/dL (ref 8.9–10.3)
Chloride: 98 mmol/L (ref 98–111)
Creatinine, Ser: 0.99 mg/dL (ref 0.44–1.00)
GFR, Estimated: >60 mL/min (ref >60)
Glucose, Bld: 110 mg/dL — ABNORMAL HIGH (ref 70–99)
Potassium: 3.2 mmol/L — ABNORMAL LOW (ref 3.5–5.1)
Sodium: 133 mmol/L — ABNORMAL LOW (ref 135–145)
Total Bilirubin: 0.1 mg/dL — ABNORMAL LOW (ref 0.3–1.2)
Total Protein: 7 g/dL (ref 6.5–8.1)

## 2022-07-26 LAB — MAGNESIUM: Magnesium: 2 mg/dL (ref 1.7–2.4)

## 2022-07-26 MED ORDER — SODIUM CHLORIDE 0.9% FLUSH
10.0000 mL | INTRAVENOUS | Status: DC | PRN
  Administered 2022-07-26: 10 mL

## 2022-07-26 MED ORDER — CLONIDINE HCL 0.1 MG PO TABS
0.2000 mg | ORAL_TABLET | Freq: Once | ORAL | Status: AC
  Administered 2022-07-26: 0.2 mg via ORAL
  Filled 2022-07-26: qty 2

## 2022-07-26 MED ORDER — POTASSIUM CHLORIDE CRYS ER 20 MEQ PO TBCR
40.0000 meq | EXTENDED_RELEASE_TABLET | Freq: Once | ORAL | Status: AC
  Administered 2022-07-26: 40 meq via ORAL
  Filled 2022-07-26: qty 2

## 2022-07-26 MED ORDER — SODIUM CHLORIDE 0.9 % IV SOLN
150.0000 mg | Freq: Once | INTRAVENOUS | Status: AC
  Administered 2022-07-26: 150 mg via INTRAVENOUS
  Filled 2022-07-26: qty 150

## 2022-07-26 MED ORDER — SODIUM CHLORIDE 0.9 % IV SOLN: Freq: Once | INTRAVENOUS | Status: AC

## 2022-07-26 MED ORDER — PALONOSETRON HCL INJECTION 0.25 MG/5ML
0.2500 mg | Freq: Once | INTRAVENOUS | Status: AC
  Administered 2022-07-26: 0.25 mg via INTRAVENOUS
  Filled 2022-07-26: qty 5

## 2022-07-26 MED ORDER — SODIUM CHLORIDE 0.9 % IV SOLN
10.0000 mg | Freq: Once | INTRAVENOUS | Status: AC
  Administered 2022-07-26: 10 mg via INTRAVENOUS
  Filled 2022-07-26: qty 10

## 2022-07-26 MED ORDER — HEPARIN SOD (PORK) LOCK FLUSH 100 UNIT/ML IV SOLN
500.0000 [IU] | Freq: Once | INTRAVENOUS | Status: AC | PRN
  Administered 2022-07-26: 500 [IU]

## 2022-07-26 MED ORDER — SODIUM CHLORIDE 0.9 % IV SOLN
341.0000 mg | Freq: Once | INTRAVENOUS | Status: AC
  Administered 2022-07-26: 340 mg via INTRAVENOUS
  Filled 2022-07-26: qty 34

## 2022-07-26 MED ORDER — SODIUM CHLORIDE 0.9% FLUSH
10.0000 mL | Freq: Once | INTRAVENOUS | Status: AC
  Administered 2022-07-26: 10 mL via INTRAVENOUS

## 2022-07-26 MED ORDER — SODIUM CHLORIDE 0.9 % IV SOLN
75.0000 mg/m2 | Freq: Once | INTRAVENOUS | Status: AC
  Administered 2022-07-26: 119 mg via INTRAVENOUS
  Filled 2022-07-26: qty 11.9

## 2022-07-26 NOTE — Progress Notes (Signed)
Pharmacist Chemotherapy Monitoring - Initial Assessment    Anticipated start date: 07/26/22   The following has been reviewed per standard work regarding the patient's treatment regimen: The patient's diagnosis, treatment plan and drug doses, and organ/hematologic function Lab orders and baseline tests specific to treatment regimen  The treatment plan start date, drug sequencing, and pre-medications Prior authorization status  Patient's documented medication list, including drug-drug interaction screen and prescriptions for anti-emetics and supportive care specific to the treatment regimen The drug concentrations, fluid compatibility, administration routes, and timing of the medications to be used The patient's access for treatment and lifetime cumulative dose history, if applicable  The patient's medication allergies and previous infusion related reactions, if applicable   Changes made to treatment plan:  N/A  Follow up needed:  N/A   Stephens Shire, RPH, 07/26/2022  9:00 AM

## 2022-07-26 NOTE — Progress Notes (Incomplete Revision)
Patient presents today for C1D1 Docetaxel and Carboplatin infusion. Patient is in satisfactory condition with no new complaints voiced.  Vital signs are stable.  Labs reviewed and all labs are within treatment parameters. Patient's potassium is 3.2 today per Dr.K's standing order. Pt will receive potassium chloride 40 mEq p.o x 1 dose.  We will proceed with treatment per MD orders.    Pt's BP is 143/100 before starting Docetaxel, patient didn't take her BP medication this morning because  it was normal. Pt has to  check BP before taking any medication. Pt took her HCTZ BP pil while in the clinic today.. Dr.K made aware and stated pt can proceed with Taxotere and to wait 30 minutes and re-check BP and  to give Clonidine 0.2mg  if pt's BP is still high.   After 30 minutes  re-checking pt's of giving Clonidine BP was re-checked and it was 139/87.   C1D1 Docetaxel, Carboplatin, and 40 mEq potassoium chloride x 1 dose given today per MD orders. Tolerated infusion without adverse affects. Vital signs stable. No complaints at this time. Discharged from clinic ambulatory in stable condition. Alert and oriented x 3. F/U with [facility] as scheduled.

## 2022-07-26 NOTE — Patient Instructions (Signed)
[facility]  Discharge Instructions: Thank you for choosing [facility] to provide your oncology and hematology care.  If you have a lab appointment with the Cancer Center - please note that after April 8th, 2024, all labs will be drawn in the cancer center.  You do not have to check in or register with the main entrance as you have in the past but will complete your check-in in the cancer center.  Wear comfortable clothing and clothing appropriate for easy access to any Portacath or PICC line.   We strive to give you quality time with your provider. You may need to reschedule your appointment if you arrive late (15 or more minutes).  Arriving late affects you and other patients whose appointments are after yours.  Also, if you miss three or more appointments without notifying the office, you may be dismissed from the clinic at the provider's discretion.      For prescription refill requests, have your pharmacy contact our office and allow 72 hours for refills to be completed.    Today you received the following chemotherapy and/or immunotherapy agents Docetaxel and Carboplatin   To help prevent nausea and vomiting after your treatment, we encourage you to take your nausea medication as directed.  Docetaxel Injection What is this medication? DOCETAXEL (doe se TAX el) treats some types of cancer. It works by slowing down the growth of cancer cells. This medicine may be used for other purposes; ask your health care provider or pharmacist if you have questions. COMMON BRAND NAME(S): Docefrez, Taxotere What should I tell my care team before I take this medication? They need to know if you have any of these conditions: Kidney disease Liver disease Low white blood cell levels Tingling of the fingers or toes or other nerve disorder An unusual or allergic reaction to docetaxel, polysorbate 80, other medications, foods, dyes, or preservatives Pregnant or trying to  get pregnant Breast-feeding How should I use this medication? This medication is injected into a vein. It is given by your care team in a hospital or clinic setting. Talk to your care team about the use of this medication in children. Special care may be needed. Overdosage: If you think you have taken too much of this medicine contact a poison control center or emergency room at once. NOTE: This medicine is only for you. Do not share this medicine with others. What if I miss a dose? Keep appointments for follow-up doses. It is important not to miss your dose. Call your care team if you are unable to keep an appointment. What may interact with this medication? Do not take this medication with any of the following: Live virus vaccines This medication may also interact with the following: Certain antibiotics, such as clarithromycin, telithromycin Certain antivirals for HIV or hepatitis Certain medications for fungal infections, such as itraconazole, ketoconazole, voriconazole Grapefruit juice Nefazodone Supplements, such as St. John's wort This list may not describe all possible interactions. Give your health care provider a list of all the medicines, herbs, non-prescription drugs, or dietary supplements you use. Also tell them if you smoke, drink alcohol, or use illegal drugs. Some items may interact with your medicine. What should I watch for while using this medication? This medication may make you feel generally unwell. This is not uncommon as chemotherapy can affect healthy cells as well as cancer cells. Report any side effects. Continue your course of treatment even though you feel ill unless your care  team tells you to stop. You may need blood work done while you are taking this medication. This medication can cause serious side effects and infusion reactions. To reduce the risk, your care team may give you other medications to take before receiving this one. Be sure to follow the  directions from your care team. This medication may increase your risk of getting an infection. Call your care team for advice if you get a fever, chills, sore throat, or other symptoms of a cold or flu. Do not treat yourself. Try to avoid being around people who are sick. Avoid taking medications that contain aspirin, acetaminophen, ibuprofen, naproxen, or ketoprofen unless instructed by your care team. These medications may hide a fever. Be careful brushing or flossing your teeth or using a toothpick because you may get an infection or bleed more easily. If you have any dental work done, tell your dentist you are receiving this medication. Some products may contain alcohol. Ask your care team if this medication contains alcohol. Be sure to tell all care teams you are taking this medicine. Certain medications, like metronidazole and disulfiram, can cause an unpleasant reaction when taken with alcohol. The reaction includes flushing, headache, nausea, vomiting, sweating, and increased thirst. The reaction can last from 30 minutes to several hours. This medication may affect your coordination, reaction time, or judgement. Do not drive or operate machinery until you know how this medication affects you. Sit up or stand slowly to reduce the risk of dizzy or fainting spells. Drinking alcohol with this medication can increase the risk of these side effects. Talk to your care team about your risk of cancer. You may be more at risk for certain types of cancer if you take this medication. Talk to your care team if you wish to become pregnant or think you might be pregnant. This medication can cause serious birth defects if taken during pregnancy or if you get pregnant within 2 months after stopping therapy. A negative pregnancy test is required before starting this medication. A reliable form of contraception is recommended while taking this medication and for 2 months after stopping it. Talk to your care team about  reliable forms of contraception. Do not breast-feed while taking this medication and for 1 week after stopping therapy. Use a condom during sex and for 4 months after stopping therapy. Tell your care team right away if you think your partner might be pregnant. This medication can cause serious birth defects. This medication may cause infertility. Talk to your care team if you are concerned about your fertility. What side effects may I notice from receiving this medication? Side effects that you should report to your care team as soon as possible: Allergic reactions--skin rash, itching, hives, swelling of the face, lips, tongue, or throat Change in vision such as blurry vision, seeing halos around lights, vision loss Infection--fever, chills, cough, or sore throat Infusion reactions--chest pain, shortness of breath or trouble breathing, feeling faint or lightheaded Low red blood cell level--unusual weakness or fatigue, dizziness, headache, trouble breathing Pain, tingling, or numbness in the hands or feet Painful swelling, warmth, or redness of the skin, blisters or sores at the infusion site Redness, blistering, peeling, or loosening of the skin, including inside the mouth Sudden or severe stomach pain, bloody diarrhea, fever, nausea, vomiting Swelling of the ankles, hands, or feet Tumor lysis syndrome (TLS)--nausea, vomiting, diarrhea, decrease in the amount of urine, dark urine, unusual weakness or fatigue, confusion, muscle pain or cramps, fast or  irregular heartbeat, joint pain Unusual bruising or bleeding Side effects that usually do not require medical attention (report to your care team if they continue or are bothersome): Change in nail shape, thickness, or color Change in taste Hair loss Increased tears This list may not describe all possible side effects. Call your doctor for medical advice about side effects. You may report side effects to FDA at 1-800-FDA-1088. Where should I keep  my medication? This medication is given in a hospital or clinic. It will not be stored at home. NOTE: This sheet is a summary. It may not cover all possible information. If you have questions about this medicine, talk to your doctor, pharmacist, or health care provider.  2023 Elsevier/Gold Standard (2007-05-20 00:00:00)   Carboplatin Injection What is this medication? CARBOPLATIN (KAR boe pla tin) treats some types of cancer. It works by slowing down the growth of cancer cells. This medicine may be used for other purposes; ask your health care provider or pharmacist if you have questions. COMMON BRAND NAME(S): Paraplatin What should I tell my care team before I take this medication? They need to know if you have any of these conditions: Blood disorders Hearing problems Kidney disease Recent or ongoing radiation therapy An unusual or allergic reaction to carboplatin, cisplatin, other medications, foods, dyes, or preservatives Pregnant or trying to get pregnant Breast-feeding How should I use this medication? This medication is injected into a vein. It is given by your care team in a hospital or clinic setting. Talk to your care team about the use of this medication in children. Special care may be needed. Overdosage: If you think you have taken too much of this medicine contact a poison control center or emergency room at once. NOTE: This medicine is only for you. Do not share this medicine with others. What if I miss a dose? Keep appointments for follow-up doses. It is important not to miss your dose. Call your care team if you are unable to keep an appointment. What may interact with this medication? Medications for seizures Some antibiotics, such as amikacin, gentamicin, neomycin, streptomycin, tobramycin Vaccines This list may not describe all possible interactions. Give your health care provider a list of all the medicines, herbs, non-prescription drugs, or dietary supplements you  use. Also tell them if you smoke, drink alcohol, or use illegal drugs. Some items may interact with your medicine. What should I watch for while using this medication? Your condition will be monitored carefully while you are receiving this medication. You may need blood work while taking this medication. This medication may make you feel generally unwell. This is not uncommon, as chemotherapy can affect healthy cells as well as cancer cells. Report any side effects. Continue your course of treatment even though you feel ill unless your care team tells you to stop. In some cases, you may be given additional medications to help with side effects. Follow all directions for their use. This medication may increase your risk of getting an infection. Call your care team for advice if you get a fever, chills, sore throat, or other symptoms of a cold or flu. Do not treat yourself. Try to avoid being around people who are sick. Avoid taking medications that contain aspirin, acetaminophen, ibuprofen, naproxen, or ketoprofen unless instructed by your care team. These medications may hide a fever. Be careful brushing or flossing your teeth or using a toothpick because you may get an infection or bleed more easily. If you have any dental work  done, tell your dentist you are receiving this medication. Talk to your care team if you wish to become pregnant or think you might be pregnant. This medication can cause serious birth defects. Talk to your care team about effective forms of contraception. Do not breast-feed while taking this medication. What side effects may I notice from receiving this medication? Side effects that you should report to your care team as soon as possible: Allergic reactions--skin rash, itching, hives, swelling of the face, lips, tongue, or throat Infection--fever, chills, cough, sore throat, wounds that don't heal, pain or trouble when passing urine, general feeling of discomfort or being  unwell Low red blood cell level--unusual weakness or fatigue, dizziness, headache, trouble breathing Pain, tingling, or numbness in the hands or feet, muscle weakness, change in vision, confusion or trouble speaking, loss of balance or coordination, trouble walking, seizures Unusual bruising or bleeding Side effects that usually do not require medical attention (report to your care team if they continue or are bothersome): Hair loss Nausea Unusual weakness or fatigue Vomiting This list may not describe all possible side effects. Call your doctor for medical advice about side effects. You may report side effects to FDA at 1-800-FDA-1088. Where should I keep my medication? This medication is given in a hospital or clinic. It will not be stored at home. NOTE: This sheet is a summary. It may not cover all possible information. If you have questions about this medicine, talk to your doctor, pharmacist, or health care provider.  2023 Elsevier/Gold Standard (2007-05-20 00:00:00)   BELOW ARE SYMPTOMS THAT SHOULD BE REPORTED IMMEDIATELY: *FEVER GREATER THAN 100.4 F (38 C) OR HIGHER *CHILLS OR SWEATING *NAUSEA AND VOMITING THAT IS NOT CONTROLLED WITH YOUR NAUSEA MEDICATION *UNUSUAL SHORTNESS OF BREATH *UNUSUAL BRUISING OR BLEEDING *URINARY PROBLEMS (pain or burning when urinating, or frequent urination) *BOWEL PROBLEMS (unusual diarrhea, constipation, pain near the anus) TENDERNESS IN MOUTH AND THROAT WITH OR WITHOUT PRESENCE OF ULCERS (sore throat, sores in mouth, or a toothache) UNUSUAL RASH, SWELLING OR PAIN  UNUSUAL VAGINAL DISCHARGE OR ITCHING   Items with * indicate a potential emergency and should be followed up as soon as possible or go to the Emergency Department if any problems should occur.  Please show the CHEMOTHERAPY ALERT CARD or IMMUNOTHERAPY ALERT CARD at check-in to the Emergency Department and triage nurse.  Should you have questions after your visit or need to cancel or  reschedule your appointment, please contact [facility] [phone]  and follow the prompts.  Office hours are 8:00 a.m. to 4:30 p.m. Monday - Friday. Please note that voicemails left after 4:00 p.m. may not be returned until the following business day.  We are closed weekends and major holidays. You have access to a nurse at all times for urgent questions. Please call the main number to the clinic [phone] and follow the prompts.  For any non-urgent questions, you may also contact your provider using MyChart. We now offer e-Visits for anyone 83 and older to request care online for non-urgent symptoms. For details visit [email or web address].   Also download the MyChart app! Go to the app store, search "MyChart", open the app, select [place], and log in with your MyChart username and password.

## 2022-07-26 NOTE — Progress Notes (Signed)
Patient presents today for C1D1 Docetaxel and Carboplatin infusion. Patient is in satisfactory condition with no new complaints voiced.  Vital signs are stable.  Labs reviewed and all labs are within treatment parameters. Patient's potassium is 3.2 today per Dr.K's standing order. Pt will receive potassium chloride 40 mEq p.o x 1 dose.  We will proceed with treatment per MD orders.    C1D1 Docetaxel, Carboplatin, and 40 mEq potassoium chloride x 1 dose given today per MD orders. Tolerated infusion without adverse affects. Vital signs stable. No complaints at this time. Discharged from clinic ambulatory in stable condition. Alert and oriented x 3. F/U with [facility] as scheduled.

## 2022-07-27 ENCOUNTER — Telehealth: Payer: Self-pay

## 2022-07-27 NOTE — Telephone Encounter (Signed)
Patient called for 24-hour follow-up patient states she had some stomach cramping after she left the clinic yesterday which resolved on its on, no BM noted. Patient states she is feeling pretty good. Patient made aware of upcoming injection and patient verbalized understanding.

## 2022-07-28 ENCOUNTER — Inpatient Hospital Stay: Payer: PPO

## 2022-07-28 VITALS — BP 103/71 | HR 78 | Temp 97.9°F | Resp 18

## 2022-07-28 DIAGNOSIS — C3431 Malignant neoplasm of lower lobe, right bronchus or lung: Secondary | ICD-10-CM

## 2022-07-28 DIAGNOSIS — Z5111 Encounter for antineoplastic chemotherapy: Secondary | ICD-10-CM | POA: Diagnosis not present

## 2022-07-28 MED ORDER — PEGFILGRASTIM-FPGK 6 MG/0.6ML ~~LOC~~ SOSY
6.0000 mg | PREFILLED_SYRINGE | Freq: Once | SUBCUTANEOUS | Status: AC
  Administered 2022-07-28: 6 mg via SUBCUTANEOUS
  Filled 2022-07-28: qty 0.6

## 2022-07-28 MED ORDER — PEGFILGRASTIM-CBQV 6 MG/0.6ML ~~LOC~~ SOSY: 6.0000 mg | PREFILLED_SYRINGE | Freq: Once | SUBCUTANEOUS | Status: DC

## 2022-07-28 NOTE — Progress Notes (Signed)
Patient tolerated injection with no complaints voiced. Site clean and dry with no bruising or swelling noted at site. See MAR for details. Band aid applied.  Patient stable during and after injection. VSS with discharge and left in satisfactory condition with no s/s of distress noted.  

## 2022-07-28 NOTE — Patient Instructions (Signed)
[facility]  Discharge Instructions: Thank you for choosing [facility] to provide your oncology and hematology care.  If you have a lab appointment with the Cancer Center - please note that after April 8th, 2024, all labs will be drawn in the cancer center.  You do not have to check in or register with the main entrance as you have in the past but will complete your check-in in the cancer center.  Wear comfortable clothing and clothing appropriate for easy access to any Portacath or PICC line.   We strive to give you quality time with your provider. You may need to reschedule your appointment if you arrive late (15 or more minutes).  Arriving late affects you and other patients whose appointments are after yours.  Also, if you miss three or more appointments without notifying the office, you may be dismissed from the clinic at the provider's discretion.      For prescription refill requests, have your pharmacy contact our office and allow 72 hours for refills to be completed.    Today you received the following injection, return as scheduled.  To help prevent nausea and vomiting after your treatment, we encourage you to take your nausea medication as directed.  BELOW ARE SYMPTOMS THAT SHOULD BE REPORTED IMMEDIATELY: *FEVER GREATER THAN 100.4 F (38 C) OR HIGHER *CHILLS OR SWEATING *NAUSEA AND VOMITING THAT IS NOT CONTROLLED WITH YOUR NAUSEA MEDICATION *UNUSUAL SHORTNESS OF BREATH *UNUSUAL BRUISING OR BLEEDING *URINARY PROBLEMS (pain or burning when urinating, or frequent urination) *BOWEL PROBLEMS (unusual diarrhea, constipation, pain near the anus) TENDERNESS IN MOUTH AND THROAT WITH OR WITHOUT PRESENCE OF ULCERS (sore throat, sores in mouth, or a toothache) UNUSUAL RASH, SWELLING OR PAIN  UNUSUAL VAGINAL DISCHARGE OR ITCHING   Items with * indicate a potential emergency and should be followed up as soon as possible or go to the Emergency Department if  any problems should occur.  Please show the CHEMOTHERAPY ALERT CARD or IMMUNOTHERAPY ALERT CARD at check-in to the Emergency Department and triage nurse.  Should you have questions after your visit or need to cancel or reschedule your appointment, please contact [facility] [phone]  and follow the prompts.  Office hours are 8:00 a.m. to 4:30 p.m. Monday - Friday. Please note that voicemails left after 4:00 p.m. may not be returned until the following business day.  We are closed weekends and major holidays. You have access to a nurse at all times for urgent questions. Please call the main number to the clinic [phone] and follow the prompts.  For any non-urgent questions, you may also contact your provider using MyChart. We now offer e-Visits for anyone 53 and older to request care online for non-urgent symptoms. For details visit [email or web address].   Also download the MyChart app! Go to the app store, search "MyChart", open the app, select [place], and log in with your MyChart username and password.

## 2022-07-28 NOTE — Progress Notes (Signed)
The following biosimilar Stimufend (pegfilgrastim-fpgk) has been selected for use in this patient.  PA approved.  Pryor Ochoa, PharmD

## 2022-07-29 ENCOUNTER — Other Ambulatory Visit: Payer: Self-pay

## 2022-07-29 DIAGNOSIS — C3431 Malignant neoplasm of lower lobe, right bronchus or lung: Secondary | ICD-10-CM

## 2022-07-30 ENCOUNTER — Encounter (HOSPITAL_COMMUNITY): Payer: Self-pay

## 2022-07-30 ENCOUNTER — Other Ambulatory Visit: Payer: Self-pay

## 2022-07-30 NOTE — Progress Notes (Unsigned)
[facility] [address], [place] [address] Phone: [phone] Fax: [phone]  SYMPTOM MANAGEMENT CLINIC PROGRESS NOTE   [name] [id number] 11/14/1955 67 y.o.  [name] is managed by Dr. 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  She received her first treatment with carboplatin and docetaxel on 07/26/2022.  Since that time, she has been feeling ***. *** Nausea/vomiting/diarrhea *** Mouth sores *** Fatigue - energy ***% *** Appetite ***% *** Oral intake (food) *** Water/liquids *** *** Weight today is ***.  <<OTHER SYMPTOMS>> *** Changes in urine *** Rash or skin changes *** Swelling/fluid buildup *** Chest pain, SOB, DOE *** Neuropathy *** New pain (joint pain, headaches, leg pain) *** Bruising or bleeding *** B symptoms   DOCETAXEL: *** Nausea/vomiting,  *** peripheral neuropathy,  *** rash,  *** alopecia,  *** stomatitis,  *** fluid retention,  *** leg pain (usually calves),  *** hypersensitivity reaction  (*** Myelosuppression, elevated LFTs)  ((*** CARBOPLATIN: *** Leukopenia/anemia/thrombocytopenia, nausea/vomiting, electrolyte disturbances, liver toxicity, kidney toxicity, no live vaccines, peripheral neuropathy, allergic, vision/hearing changes *** ))   ASSESSMENT & PLAN:  ## STAGE II RIGHT LOWER LOBE SQUAMOUS CELL CARCINOMA OF THE LUNG - Primary oncologist is Dr. Ellin Saba - Right lower lobectomy and lymph node dissection on 06/14/2022 - Adjuvant chemotherapy recommended with carboplatin + docetaxel (q 21 days x 4  cycles) - Cycle #1, day #1 on 07/26/2022 - Pegfilgrastim given 07/28/2022. - PLAN: Next MD visit and cycle #2 due on 08/16/2022  # *** - *** - PLAN: ***  # *** - *** - PLAN: ***  # *** - *** - PLAN: ***  # *** - *** - PLAN: ***  # OTHER PMH: - Anxiety/depression - Hypothyroidism - Arthritis - Hypertension - History of cervical cancer - Current active smoker  PLAN SUMMARY: >> *** >> *** >> *** >> Next scheduled appointment with main oncologist: ***   REVIEW OF SYSTEMS: ***  Review of Systems  Past Medical History, Surgical history, Social history, and Family history were reviewed as documented elsewhere in chart, and were updated as appropriate.   OBJECTIVE:  Physical Exam: *** There were no vitals taken for this visit. ECOG: ***  Physical Exam  Lab Review:     Component Value Date/Time   NA 133 (L) 07/26/2022 0817   NA 141 12/30/2016 1109   K 3.2 (L) 07/26/2022 0817   K 4.5 12/30/2016 1109   CL 98 07/26/2022 0817   CO2 26 07/26/2022 0817   CO2 28 12/30/2016 1109   GLUCOSE 110 (H) 07/26/2022 0817   GLUCOSE 97 12/30/2016 1109   BUN 16 07/26/2022 0817   BUN 5.3 (L) 12/30/2016 1109   CREATININE 0.99 07/26/2022 0817   CREATININE 0.9 12/30/2016 1109   CALCIUM 9.1 07/26/2022 0817   CALCIUM 10.1 12/30/2016 1109   PROT 7.0 07/26/2022 0817   ALBUMIN 3.6 07/26/2022 0817   AST 15 07/26/2022 0817   ALT 11 07/26/2022 0817   ALKPHOS 93 07/26/2022 0817   BILITOT 0.1 (L) 07/26/2022 0817   GFRNONAA >60 07/26/2022 0817   GFRAA >60 09/25/2018 1139  Component Value Date/Time   WBC 8.5 07/26/2022 0817   RBC 4.57 07/26/2022 0817   HGB 12.7 07/26/2022 0817   HCT 39.1 07/26/2022 0817   PLT 262 07/26/2022 0817   MCV 85.6 07/26/2022 0817   MCH 27.8 07/26/2022 0817   MCHC 32.5 07/26/2022 0817   RDW 15.3 07/26/2022 0817   LYMPHSABS 1.6 07/26/2022 0817   MONOABS 0.5 07/26/2022 0817   EOSABS 0.5 07/26/2022 0817   BASOSABS 0.1 07/26/2022 0817    -------------------------------  Imaging from last 24 hours (if applicable): Radiology interpretation: IR IMAGING GUIDED PORT INSERTION  Result Date: 07/21/2022 INDICATION: 67 year old with lung cancer. Port-A-Cath needed for chemotherapy administration. EXAM: FLUOROSCOPIC AND ULTRASOUND GUIDED PLACEMENT OF A SUBCUTANEOUS PORT COMPARISON:  None Available. MEDICATIONS: Moderate sedation ANESTHESIA/SEDATION: Moderate (conscious) sedation was employed during this procedure. A total of Versed 1 mg and fentanyl 50 mcg was administered intravenously at the order of the provider performing the procedure. Total intra-service moderate sedation time: 28 minutes. Patient's level of consciousness and vital signs were monitored continuously by radiology nurse throughout the procedure under the supervision of the provider performing the procedure. FLUOROSCOPY TIME:  Radiation Exposure Index (as provided by the fluoroscopic device): 3 mGy Kerma COMPLICATIONS: None immediate. PROCEDURE: The procedure, risks, benefits, and alternatives were explained to the patient. Questions regarding the procedure were encouraged and answered. The patient understands and consents to the procedure. Patient was placed supine on the interventional table. Ultrasound confirmed a patent right internal jugular vein. Ultrasound image was saved for documentation. The right chest and neck were cleaned with a skin antiseptic and a sterile drape was placed. Maximal barrier sterile technique was utilized including caps, mask, sterile gowns, sterile gloves, sterile drape, hand hygiene and skin antiseptic. The right neck was anesthetized with 1% lidocaine. Small incision was made in the right neck with a blade. Micropuncture set was placed in the right internal jugular vein with ultrasound guidance. The micropuncture wire was used for measurement purposes. The right chest was anesthetized with 1% lidocaine. #15 blade was used to make an incision and a  subcutaneous port pocket was formed. 8 french Power Port was assembled. Subcutaneous tunnel was formed with a stiff tunneling device. The port catheter was brought through the subcutaneous tunnel. The port was placed in the subcutaneous pocket. The micropuncture set was exchanged for a peel-away sheath. The catheter was placed through the peel-away sheath and the tip was positioned at the superior cavoatrial junction. Catheter placement was confirmed with fluoroscopy. The port was accessed and flushed with heparinized saline. The port pocket was closed using two layers of absorbable sutures and Dermabond. The vein skin site was closed using a single layer of absorbable suture and Dermabond. Sterile dressings were applied. Patient tolerated the procedure well without an immediate complication. Ultrasound and fluoroscopic images were taken and saved for this procedure. IMPRESSION: Placement of a subcutaneous power-injectable port device. Catheter tip at the superior cavoatrial junction. Electronically Signed   By: Richarda Overlie M.D.   On: 07/21/2022 17:44      WRAP UP:  All questions were answered. The patient knows to call the clinic with any problems, questions or concerns.  Medical decision making: ***  Time spent on visit: I spent {CHL ONC TIME VISIT - WUJWJ:[id number]} counseling the patient face to face. The total time spent in the appointment was {CHL ONC TIME VISIT - OZHYQ:[id number]} and more than 50% was on counseling.  Carnella Guadalajara, PA-C  ***

## 2022-08-02 ENCOUNTER — Inpatient Hospital Stay (HOSPITAL_BASED_OUTPATIENT_CLINIC_OR_DEPARTMENT_OTHER): Payer: PPO | Admitting: Physician Assistant

## 2022-08-02 ENCOUNTER — Inpatient Hospital Stay: Payer: PPO

## 2022-08-02 VITALS — BP 126/82 | HR 94 | Temp 98.1°F | Resp 19 | Wt 114.6 lb

## 2022-08-02 DIAGNOSIS — C3431 Malignant neoplasm of lower lobe, right bronchus or lung: Secondary | ICD-10-CM

## 2022-08-02 DIAGNOSIS — Z09 Encounter for follow-up examination after completed treatment for conditions other than malignant neoplasm: Secondary | ICD-10-CM

## 2022-08-02 DIAGNOSIS — E876 Hypokalemia: Secondary | ICD-10-CM | POA: Diagnosis not present

## 2022-08-02 DIAGNOSIS — Z95828 Presence of other vascular implants and grafts: Secondary | ICD-10-CM

## 2022-08-02 DIAGNOSIS — Z5111 Encounter for antineoplastic chemotherapy: Secondary | ICD-10-CM | POA: Diagnosis not present

## 2022-08-02 LAB — COMPREHENSIVE METABOLIC PANEL
ALT: 11 U/L (ref 0–44)
AST: 15 U/L (ref 15–41)
Albumin: 3.5 g/dL (ref 3.5–5.0)
Alkaline Phosphatase: 90 U/L (ref 38–126)
Anion gap: 10 (ref 5–15)
BUN: 10 mg/dL (ref 8–23)
CO2: 26 mmol/L (ref 22–32)
Calcium: 8.7 mg/dL — ABNORMAL LOW (ref 8.9–10.3)
Chloride: 94 mmol/L — ABNORMAL LOW (ref 98–111)
Creatinine, Ser: 0.96 mg/dL (ref 0.44–1.00)
GFR, Estimated: >60 mL/min (ref >60)
Glucose, Bld: 104 mg/dL — ABNORMAL HIGH (ref 70–99)
Potassium: 3 mmol/L — ABNORMAL LOW (ref 3.5–5.1)
Sodium: 130 mmol/L — ABNORMAL LOW (ref 135–145)
Total Bilirubin: 0.6 mg/dL (ref 0.3–1.2)
Total Protein: 6.3 g/dL — ABNORMAL LOW (ref 6.5–8.1)

## 2022-08-02 LAB — MAGNESIUM: Magnesium: 1.7 mg/dL (ref 1.7–2.4)

## 2022-08-02 LAB — CBC WITH DIFFERENTIAL/PLATELET
Abs Immature Granulocytes: 0.03 10*3/uL (ref 0.00–0.07)
Basophils Absolute: 0 10*3/uL (ref 0.0–0.1)
Basophils Relative: 1 %
Eosinophils Absolute: 0.1 10*3/uL (ref 0.0–0.5)
Eosinophils Relative: 2 %
HCT: 35.6 % — ABNORMAL LOW (ref 36.0–46.0)
Hemoglobin: 11.6 g/dL — ABNORMAL LOW (ref 12.0–15.0)
Immature Granulocytes: 1 %
Lymphocytes Relative: 31 %
Lymphs Abs: 1.6 10*3/uL (ref 0.7–4.0)
MCH: 27.5 pg (ref 26.0–34.0)
MCHC: 32.6 g/dL (ref 30.0–36.0)
MCV: 84.4 fL (ref 80.0–100.0)
Monocytes Absolute: 0.5 10*3/uL (ref 0.1–1.0)
Monocytes Relative: 10 %
Neutro Abs: 2.9 10*3/uL (ref 1.7–7.7)
Neutrophils Relative %: 55 %
Platelets: 208 10*3/uL (ref 150–400)
RBC: 4.22 MIL/uL (ref 3.87–5.11)
RDW: 14.9 % (ref 11.5–15.5)
WBC: 5.1 10*3/uL (ref 4.0–10.5)
nRBC: 0 % (ref 0.0–0.2)

## 2022-08-02 MED ORDER — HEPARIN SOD (PORK) LOCK FLUSH 100 UNIT/ML IV SOLN
500.0000 [IU] | Freq: Once | INTRAVENOUS | Status: AC
  Administered 2022-08-02: 500 [IU] via INTRAVENOUS

## 2022-08-02 MED ORDER — POTASSIUM CHLORIDE CRYS ER 20 MEQ PO TBCR
20.0000 meq | EXTENDED_RELEASE_TABLET | Freq: Every day | ORAL | 3 refills | Status: DC
Start: 2022-08-02 — End: 2023-03-31

## 2022-08-02 MED ORDER — SODIUM CHLORIDE 0.9% FLUSH
10.0000 mL | Freq: Once | INTRAVENOUS | Status: AC
  Administered 2022-08-02: 10 mL via INTRAVENOUS

## 2022-08-02 NOTE — Patient Instructions (Signed)
[facility] **VISIT SUMMARY & IMPORTANT INSTRUCTIONS **   You were seen today by Rojelio Brenner PA-C for your chemotherapy follow-up and symptom management visit.    DIARRHEA: Take Imodium 2 tablets after your first loose bowel movement of the day.  Take an additional tablet of Imodium after every subsequent loose bowel movement.  Do not take more than 8 tablets (16 mg total) of Imodium in 1 day.  If you have severe diarrhea, please call the clinic so that we can prescribe another medicine if needed.     NAUSEA: Continue to take Compazine 10 mg every 6 hours as needed for nausea.   Please call the clinic if you have severe nausea and vomiting and are not able to keep down water or liquids.    DEHYDRATION: It is very important that you remain hydrated while you are receiving treatment!   Make sure that you are drinking at least 64 ounces of water each day.  You can drink juice, decaffeinated drinks, and sugar free beverages in addition to water, but should still drink plenty of water. Limit the amount of soda you drink. Although plain water is the best for you, you can try adding sugar-free/caffeine-free flavor packs (ex: Pedialyte) to change the flavor. Popsicles, soup, and ice cream are great ways to add even more fluids! Mix your juice and Gatorade with some water to increase your water intake. If you are unable to drink enough water, or are losing fluids through vomiting or diarrhea, please CALL THE CANCER CENTER. If you are severely dehydrated, you may have symptoms such as low blood pressure, increased weakness, lightheadedness with standing, passing out, or feeling that your heartbeat is racing.  If the symptoms occur, please CALL THE CANCER CENTER.  If the symptoms are severe or occur outside of business hours, proceed to the emergency department.   NUTRITION & WEIGHT LOSS:  Try to eat small frequent meals throughout the day. Include a healthy  source of protein such as cheese, yogurt, eggs, lean meats, beans, nuts, or nut-butters. Eat a snack at night before bedtime. Drink Boost or Ensure, once daily to maintain adequate nutrition while you have poor appetite from chemotherapy.  LOW POTASSIUM: Start taking a potassium supplement (potassium chloride 20 mEq) once daily in the morning.  **Please call if you have any new or worsening symptoms before your next scheduled appointment.  If necessary, we can bring you in for same-day symptom management visit.  FOLLOW-UP APPOINTMENT: Your next visit with Dr. Ellin Saba is scheduled for 08/16/2022.  ** Thank you for trusting me with your healthcare!  I strive to provide all of my patients with quality care at each visit.  If you receive a survey for this visit, I would be so grateful to you for taking the time to provide feedback.  Thank you in advance!  ~ Riona Lahti                   Dr. Doreatha Massed   &   Rojelio Brenner, PA-C   - - - - - - - - - - - - - - - - - -    Thank you for choosing [facility] at [facility] to provide your oncology and hematology care.  To afford each patient quality time with our provider, please arrive at least 15 minutes before your scheduled appointment time.   If you have a lab appointment with the Cancer Center  please come in thru the Main Entrance and check in at the main information desk.  You need to re-schedule your appointment should you arrive 10 or more minutes late.  We strive to give you quality time with our providers, and arriving late affects you and other patients whose appointments are after yours.  Also, if you no show three or more times for appointments you may be dismissed from the clinic at the providers discretion.     Again, thank you for choosing [facility].  Our hope is that these requests will decrease the amount of time that you wait before being seen by our physicians.        _____________________________________________________________  Should you have questions after your visit to [facility], please contact our office at [phone] and follow the prompts.  Our office hours are 8:00 a.m. and 4:30 p.m. Monday - Friday.  Please note that voicemails left after 4:00 p.m. may not be returned until the following business day.  We are closed weekends and major holidays.  You do have access to a nurse 24-7, just call the main number to the clinic [phone] and do not press any options, hold on the line and a nurse will answer the phone.    For prescription refill requests, have your pharmacy contact our office and allow 72 hours.

## 2022-08-09 ENCOUNTER — Other Ambulatory Visit: Payer: Self-pay | Admitting: Thoracic Surgery (Cardiothoracic Vascular Surgery)

## 2022-08-09 DIAGNOSIS — C3490 Malignant neoplasm of unspecified part of unspecified bronchus or lung: Secondary | ICD-10-CM

## 2022-08-10 ENCOUNTER — Encounter: Payer: Self-pay | Admitting: Thoracic Surgery (Cardiothoracic Vascular Surgery)

## 2022-08-10 ENCOUNTER — Ambulatory Visit (INDEPENDENT_AMBULATORY_CARE_PROVIDER_SITE_OTHER): Payer: Self-pay | Admitting: Thoracic Surgery (Cardiothoracic Vascular Surgery)

## 2022-08-10 ENCOUNTER — Ambulatory Visit
Admission: RE | Admit: 2022-08-10 | Discharge: 2022-08-10 | Disposition: A | Discharge status: Home / Self Care | Payer: PPO | Source: Ambulatory Visit | Attending: Thoracic Surgery (Cardiothoracic Vascular Surgery) | Admitting: Thoracic Surgery (Cardiothoracic Vascular Surgery)

## 2022-08-10 VITALS — BP 143/86 | HR 94 | Resp 20 | Ht 66.0 in | Wt 117.0 lb

## 2022-08-10 DIAGNOSIS — C3490 Malignant neoplasm of unspecified part of unspecified bronchus or lung: Secondary | ICD-10-CM

## 2022-08-10 DIAGNOSIS — C3491 Malignant neoplasm of unspecified part of right bronchus or lung: Secondary | ICD-10-CM

## 2022-08-10 NOTE — Progress Notes (Signed)
[address]       [place] [address]             [phone]     HPI: [name] returns for a scheduled follow-up after recent right lower lobectomy.  [name] is a 67 year old woman with a history of tobacco abuse, COPD, cervical cancer, hypertension, hypothyroidism, osteoporosis, arthritis, anxiety, depression, and a stage IIa squamous cell carcinoma of the lung.  I did a robotic assisted right lower lobectomy on 06/14/2022.  Her postoperative course was uncomplicated and she went home on day 3.  She had clinical stage Ib (T2a, N0) disease.  Pathologic stage was IIA (T2b, N0).  She saw Aloha Gell in the office on 06/29/2022.  She was feeling fairly well but had noticed a new cough that was productive.  Chest x-ray showed a infiltrate at the left base.  She was started on Augmentin.  In the interim since her last visit her cough has cleared up.  She does still have some discomfort along the right costal margin.  Still has some oxycodone left from her original prescription.  Mostly takes Tylenol for that.  She has started chemotherapy and had her first cycle about 2 weeks ago.  Her next dose is scheduled for May 6.  She has noticed some early hair loss.  Past Medical History:  Diagnosis Date   Anxiety    Arthritis    spinal stenosis   Back disorder    "crooked spine"   Bulging lumbar disc    Cervical cancer (HCC)    Complication of anesthesia    ? hypotension 10/2016 at [facility] surgery    Depression    Dyspnea    H/O degenerative disc disease    History of radiation therapy 02/22/17-03/22/17   vaginal cuff 30 Gy in 5 fractions   Hypertension    Hypothyroidism    patient taken off of hypothyroid med in 11/2016    Osteoporosis    Thyroid disease     Current Outpatient Medications  Medication Sig Dispense Refill   acetaminophen (TYLENOL) 325 MG tablet Take 650 mg by mouth every 6 (six) hours as needed for headache.     albuterol (VENTOLIN  HFA) 108 (90 Base) MCG/ACT inhaler INHALE 2 PUFFS INTO THE LUNGS EVERY 6 HOURS AS NEEDED FOR WHEEZING OR SHORTNESS OF BREATH. 8.5 g 2   alendronate (FOSAMAX) 70 MG tablet Take 70 mg by mouth once a week.     ALPRAZolam (XANAX) 1 MG tablet Take 1 mg by mouth at bedtime. May take additional 1 mg during the day if needed     atorvastatin (LIPITOR) 40 MG tablet Take 40 mg by mouth at bedtime.      benzonatate (TESSALON) 100 MG capsule Take 100 mg by mouth 3 (three) times daily as needed for cough.     CARBOPLATIN IV Inject into the vein every 21 ( twenty-one) days.     DOCEtaxel (TAXOTERE IV) Inject into the vein every 21 ( twenty-one) days.     FLUoxetine (PROZAC) 40 MG capsule Take 40 mg by mouth daily.     Fluticasone-Umeclidin-Vilant (TRELEGY ELLIPTA) 100-62.5-25 MCG/ACT AEPB Inhale 1 each into the lungs daily. 60 each 2   hydrochlorothiazide (HYDRODIURIL) 25 MG tablet Take 1 tablet (25 mg total) by mouth daily. 90 tablet 3   lidocaine-prilocaine (EMLA) cream Apply a quarter-sized amount to port a cath site and cover with plastic wrap one hour prior to infusion  appointments 30 g 3   lisinopril (ZESTRIL) 10 MG tablet Take 10 mg by mouth daily as needed (blood pressure of 170/99).     oxyCODONE (OXY IR/ROXICODONE) 5 MG immediate release tablet Take 1 tablet (5 mg total) by mouth every 6 (six) hours as needed for moderate pain. 28 tablet 0   pegfilgrastim-cbqv (UDENYCA) 6 MG/0.6ML injection Inject 6 mg into the skin every 21 ( twenty-one) days.     potassium chloride SA (KLOR-CON M) 20 MEQ tablet Take 1 tablet (20 mEq total) by mouth daily. 30 tablet 3   prochlorperazine (COMPAZINE) 10 MG tablet Take 1 tablet (10 mg total) by mouth every 6 (six) hours as needed for nausea or vomiting. 30 tablet 1   traZODone (DESYREL) 100 MG tablet Take 100 mg by mouth at bedtime.     No current facility-administered medications for this visit.    Physical Exam BP (!) 143/86 (BP Location: Left Arm, Patient  Position: Sitting, Cuff Size: Small)   Pulse 94   Resp 20   Ht 5\' 6"  (1.676 m)   Wt 117 lb (53.1 kg)   SpO2 94% Comment: RA  BMI 18.22 kg/m  67 year old woman in no acute distress Alert and oriented x 3 with no focal deficits Lungs with rhonchi bilaterally, no wheezing Cardiac regular rate and rhythm Incisions well-healed  Diagnostic Tests: CHEST - 2 VIEW   COMPARISON:  06/29/2022   FINDINGS: Frontal and lateral views of the chest demonstrate right chest wall port via internal jugular approach tip overlying superior vena cava. Stable postsurgical changes of right lower lobectomy, with stable small right pleural effusion. The left basilar consolidation seen on previous exam has cleared in the interim. No acute airspace disease. No pneumothorax. No acute bony abnormalities.   IMPRESSION: 1. Stable postsurgical changes from right lower lobectomy, with stable small right pleural effusion. 2. No acute airspace disease. Resolution of the left basilar consolidation seen previously.     Electronically Signed   By: Sharlet Salina M.D.   On: 08/10/2022 09:08 I personally reviewed the chest x-ray images.  Postoperative changes on the right.  Infiltrate left lung base has resolved.  Impression: [name] is a 67 year old woman with a history of tobacco abuse, COPD, cervical cancer, hypertension, hypothyroidism, osteoporosis, arthritis, anxiety, depression, and a stage IIa squamous cell carcinoma of the lung.  Stage IIa (T2b, N0) squamous cell carcinoma right lower lobe-now about 6 weeks out from a right lower lobectomy.  Has started adjuvant chemotherapy.  From a surgical standpoint she is doing well.  Does have some discomfort but is well-tolerated.  Only rarely does she take oxycodone.  She is receiving adjuvant chemotherapy.  Tolerated the first cycle well.  Second cycle scheduled for next Monday.  Plan: Continue to follow-up with Dr. Ellin Saba I will plan to see her  back in about 3 months just to check on her progress.  She knows to call if she has any issues in the meantime.    Loreli Slot, MD [facility] [phone]

## 2022-08-11 ENCOUNTER — Other Ambulatory Visit: Payer: Self-pay

## 2022-08-15 NOTE — Progress Notes (Signed)
[facility] [address], [place] [address]    Clinic Day:  08/16/2022  Referring physician: Pearson Grippe, MD  Patient Care Team: Pearson Grippe, MD as PCP - General (Internal Medicine) Venita Lick, MD as Consulting Physician (Orthopedic Surgery) Rourk, Gerrit Friends, MD as Consulting Physician (Gastroenterology) Doreatha Massed, MD as Medical Oncologist (Medical Oncology) Therese Sarah, RN as Oncology Nurse Navigator (Medical Oncology)   ASSESSMENT & PLAN:   Assessment: 1.  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 - 18 pound weight loss in the last 3 months due to decreased appetite - Chest x-ray on 04/17/2022: Mass in the right lower lobe. - CT chest (04/26/2022): 3.8 cm cavitary mass in the anterior right lower lobe abutting the major fissure.  4 mm subpleural nodule in the medial right upper lobe is nonspecific.  No adenopathy on noncontrast exam. - PET scan (05/06/2022): 3.8 x 2.3 cm hypermetabolic right lower lobe mass SUV 17.8.  Mildly metabolic asymmetric right hilar nodal tissue with SUV 4.2.  Prominent precarinal lymph node 9 mm with SUV 2.2. - Brain MRI (05/19/2022): No evidence of intracranial metastatic disease. - Bronchoscopy and biopsy by Dr. Tonia Brooms. - Pathology: Lymph node 11 R, 7 with no malignant cells.  Right lower lobe cavitary mass FNA consistent with squamous cell carcinoma, keratinizing. - 06/14/2022: Robotic assisted right lower lobectomy, lymph node dissection by Dr. Dorris Fetch. - Pathology: 4.3 x 3.9 x 2.8 cm right lower lobe squamous cell carcinoma, margins negative to.  Negative visceral pleural invasion and lymphovascular invasion.  0/18 lymph nodes involved.  Nodal stations examined include 4, 7, 8, 9, 10, 11, 12.  PT2b pN0. - Cycle 1 of docetaxel and carboplatin on 07/26/2022 - NGS: PD-L1 TPS 30%, TMB-high.  Negative for ALK/EGFR/BRAF/KRAS/MET/RET/ROS1.   Positive for PIK3CA and T p53. - She will be offered pembrolizumab for 1 year after completion of adjuvant chemotherapy.   2.  Social/family history: - Lives at home with her husband [name]. - She has been on disability since she was diagnosed with cervical cancer.  Prior to that she worked at a [employer].  No chemical exposure. - She is independent of ADLs and IADLs. - Current active smoker and started smoking 1 pack/day at age 67. - Maternal aunt had breast cancer.   3.  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 - XRT 30 Gray in 5 fractions to the vaginal cuff from 02/22/2017 through 03/22/2017.    Plan: 1.  Stage IIa (T2b N0) right lower lobe squamous cell carcinoma: - She received first cycle of docetaxel and carboplatin on 07/26/2022. - She had experienced occasional nausea but no vomiting.  No tingling or numbness in the extremities. - Reviewed labs today: Normal LFTs, creatinine.  CBC grossly normal. - Proceed with cycle 2.  Increase carboplatin dose to AUC 6.  RTC 3 weeks for follow-up.  2.  Hypokalemia: - Continue potassium 20 mEq daily.  Potassium today is normal.     No orders of the defined types were placed in this encounter.     I,Katie Daubenspeck,acting as a Neurosurgeon for Doreatha Massed, MD.,have documented all relevant documentation on the behalf of Doreatha Massed, MD,as directed by  Doreatha Massed, MD while in the presence of Doreatha Massed, MD.   I, Doreatha Massed MD, have reviewed the above documentation for accuracy  and completeness, and I agree with the above.   Doreatha Massed, MD   5/6/20246:37 PM  CHIEF COMPLAINT:   Diagnosis: RLL lung cancer   Cancer Staging  Primary squamous cell carcinoma of lower lobe of right lung (HCC) Staging form: Lung, AJCC 8th Edition - Clinical stage from 05/23/2022: Stage IIA (cT2b, cN0, cM0)  - Unsigned    Prior Therapy: RLL lobectomy and LND 06/14/22 (Dr. Dorris Fetch)  Current Therapy:  Adjuvant chemotherapy with carboplatin and docetaxel    HISTORY OF PRESENT ILLNESS:   Oncology History  Primary squamous cell carcinoma of lower lobe of right lung (HCC)  05/23/2022 Initial Diagnosis   Primary squamous cell carcinoma of lower lobe of right lung   07/26/2022 -  Chemotherapy   Patient is on Treatment Plan : LUNG Carboplatin (AUC 6) + Docetaxel (75) q21d x 4 cycles        INTERVAL HISTORY:   Gwendalyn is a 67 y.o. female presenting to clinic today for follow up of RLL lung cancer. She was last seen by me on 07/12/22. Of note, she was also seen by PA Rebekah on 08/02/22 in the symptom management clinic.  Since I last saw her, she had port placed on 07/21/22 and began adjuvant chemo on 07/26/22.  Today, she states that she is doing well overall. Her appetite level is at 65%. Her energy level is at 40%.  PAST MEDICAL HISTORY:   Past Medical History: Past Medical History:  Diagnosis Date   Anxiety    Arthritis    spinal stenosis   Back disorder    "crooked spine"   Bulging lumbar disc    Cervical cancer (HCC)    Complication of anesthesia    ? hypotension 10/2016 at [facility] surgery    Depression    Dyspnea    H/O degenerative disc disease    History of radiation therapy 02/22/17-03/22/17   vaginal cuff 30 Gy in 5 fractions   Hypertension    Hypothyroidism    patient taken off of hypothyroid med in 11/2016    Osteoporosis    Thyroid disease     Surgical History: Past Surgical History:  Procedure Laterality Date   ABDOMINAL EXPOSURE N/A 09/27/2018   Procedure: ABDOMINAL EXPOSURE;  Surgeon: Nada Libman, MD;  Location: [facility] OR;  Service: Vascular;  Laterality: N/A;   ABDOMINAL HYSTERECTOMY     ANTERIOR AND POSTERIOR REPAIR N/A 11/09/2016   Procedure: ANTERIOR (CYSTOCELE) AND POSTERIOR REPAIR (RECTOCELE);  Surgeon: Tilda Burrow, MD;  Location: [facility] ORS;  Service:  Gynecology;  Laterality: N/A;   ANTERIOR LUMBAR FUSION Left 09/27/2018   Procedure: ANTERIOR LUMBAR FUSION L5-S1,;  Surgeon: Venita Lick, MD;  Location: [facility] OR;  Service: Orthopedics;  Laterality: Left;  4.5 HRS/ DR. Myra Gianotti ASSIST   BACK SURGERY     BILATERAL SALPINGECTOMY Left 11/09/2016   Procedure: LEFT SALPINGECTOMY;  Surgeon: Tilda Burrow, MD;  Location: [facility];  Service: Gynecology;  Laterality: Left;   BRONCHIAL BIOPSY  05/13/2022   Procedure: BRONCHIAL BIOPSIES;  Surgeon: Josephine Igo, DO;  Location: [facility];  Service: Pulmonary;;   BRONCHIAL BRUSHINGS  05/13/2022   Procedure: BRONCHIAL BRUSHINGS;  Surgeon: Josephine Igo, DO;  Location: [facility];  Service: Pulmonary;;   BRONCHIAL NEEDLE ASPIRATION BIOPSY  05/13/2022   Procedure: BRONCHIAL NEEDLE ASPIRATION BIOPSIES;  Surgeon: Josephine Igo, DO;  Location: [facility];  Service: Pulmonary;;   CATARACT EXTRACTION W/PHACO Right 07/05/2016   Procedure: CATARACT EXTRACTION PHACO AND INTRAOCULAR  LENS PLACEMENT (IOC);  Surgeon: Gemma Payor, MD;  Location: [facility];  Service: Ophthalmology;  Laterality: Right;  CDE: 15.41   CATARACT EXTRACTION W/PHACO Left 07/19/2016   Procedure: CATARACT EXTRACTION PHACO AND INTRAOCULAR LENS PLACEMENT LEFT EYE CDE= 12.65;  Surgeon: Gemma Payor, MD;  Location: [facility];  Service: Ophthalmology;  Laterality: Left;  left   COLONOSCOPY WITH PROPOFOL N/A 09/09/2021   Procedure: COLONOSCOPY WITH PROPOFOL;  Surgeon: Corbin Ade, MD;  Location: [facility];  Service: Endoscopy;  Laterality: N/A;  10:30am   EYE SURGERY     FINE NEEDLE ASPIRATION  05/13/2022   Procedure: FINE NEEDLE ASPIRATION (FNA) LINEAR;  Surgeon: Josephine Igo, DO;  Location: [facility];  Service: Pulmonary;;   INTERCOSTAL NERVE BLOCK  06/14/2022   Procedure: INTERCOSTAL NERVE BLOCK;  Surgeon: Loreli Slot, MD;  Location: [facility] OR;  Service: Thoracic;;   IR IMAGING GUIDED PORT INSERTION  07/21/2022   LUMBAR  LAMINECTOMY/DECOMPRESSION MICRODISCECTOMY Left 09/27/2018   Procedure: L4-L5 Lumbar Laminectomy/Decompression;  Surgeon: Venita Lick, MD;  Location: [facility] OR;  Service: Orthopedics;  Laterality: Left;   NODE DISSECTION Right 06/14/2022   Procedure: NODE DISSECTION;  Surgeon: Loreli Slot, MD;  Location: [facility] OR;  Service: Thoracic;  Laterality: Right;   POLYPECTOMY  09/09/2021   Procedure: POLYPECTOMY;  Surgeon: Corbin Ade, MD;  [facility]: [facility];  Service: Endoscopy;;   ROBOTIC PELVIC AND PARA-AORTIC LYMPH NODE DISSECTION N/A 01/11/2017   Procedure: XI ROBOTIC BILATERAL PELVIC LYMPH NODE DISSECTION;  Surgeon: Adolphus Birchwood, MD;  Location: [facility] ORS;  Service: Gynecology;  Laterality: N/A;   SALPINGOOPHORECTOMY Right 11/09/2016   Procedure: RIGHT SALPINGO OOPHORECTOMY;  Surgeon: Tilda Burrow, MD;  Location: [facility];  Service: Gynecology;  Laterality: Right;   TUBAL LIGATION     VAGINAL HYSTERECTOMY N/A 11/09/2016   Procedure: HYSTERECTOMY VAGINAL;  Surgeon: Tilda Burrow, MD;  Location: [facility];  Service: Gynecology;  Laterality: N/A;   VIDEO BRONCHOSCOPY WITH ENDOBRONCHIAL ULTRASOUND Right 05/13/2022   Procedure: VIDEO BRONCHOSCOPY WITH ENDOBRONCHIAL ULTRASOUND;  Surgeon: Josephine Igo, DO;  Location: [facility];  Service: Pulmonary;  Laterality: Right;    Social History: Social History   Socioeconomic History   Marital status: Legally Separated    Spouse name: Not on file   Number of children: 1   Years of education: Not on file   Highest education level: Not on file  Occupational History   Not on file  Tobacco Use   Smoking status: Every Day    Packs/day: 1.00    Years: 44.00    Additional pack years: 0.00    Total pack years: 44.00    Types: Cigarettes   Smokeless tobacco: Never   Tobacco comments:    Smokes 1/2 ppd per pt. 08/10/22  Vaping Use   Vaping Use: Never used  Substance and Sexual Activity   Alcohol use: No    Comment: 01-06-2016 Per pt rarely,  02-05-2016 per pt no but 20yrs ago     Drug use: No    Comment: 02-05-2016 per pt no but about 40 yrs ago   Sexual activity: Not Currently    Birth control/protection: Surgical    Comment: hyst  Other Topics Concern   Not on file  Social History Narrative   Not on file   Social Determinants of Health   Financial Resource Strain: Unknown (01/20/2021)   Overall Financial Resource Strain (CARDIA)    Difficulty of Paying Living Expenses: Patient declined  Food Insecurity: No Food Insecurity (05/04/2022)   Hunger Vital Sign    Worried About Running Out of Food in the Last Year: Never true    Ran Out of Food in the Last Year: Never true  Transportation Needs: No Transportation Needs (05/04/2022)   PRAPARE - Administrator, Civil Service (Medical): No    Lack of Transportation (Non-Medical): No  Physical Activity: Inactive (01/20/2021)   Exercise Vital Sign    Days of Exercise per Week: 1 day    Minutes of Exercise per Session: 0 min  Stress: Stress Concern Present (01/20/2021)   [employer] of Occupational Health - Occupational Stress Questionnaire    Feeling of Stress : Very much  Social Connections: Moderately Isolated (01/20/2021)   Social Connection and Isolation Panel [NHANES]    Frequency of Communication with Friends and Family: Twice a week    Frequency of Social Gatherings with Friends and Family: Never    Attends Religious Services: More than 4 times per year    Active Member of [employer] or Organizations: No    Attends Banker Meetings: Never    Marital Status: Married  Catering manager Violence: Not At Risk (05/04/2022)   Humiliation, Afraid, Rape, and Kick questionnaire    Fear of Current or Ex-Partner: No    Emotionally Abused: No    Physically Abused: No    Sexually Abused: No    Family History: Family History  Problem Relation Age of Onset   Hypertension Mother    Congenital heart disease Mother    Diabetes Mother    Thyroid  disease Brother    Other Daughter        bowel issues   Colon cancer Neg Hx    Colon polyps Neg Hx     Current Medications:  Current Outpatient Medications:    acetaminophen (TYLENOL) 325 MG tablet, Take 650 mg by mouth every 6 (six) hours as needed for headache., Disp: , Rfl:    albuterol (VENTOLIN HFA) 108 (90 Base) MCG/ACT inhaler, INHALE 2 PUFFS INTO THE LUNGS EVERY 6 HOURS AS NEEDED FOR WHEEZING OR SHORTNESS OF BREATH., Disp: 8.5 g, Rfl: 2   alendronate (FOSAMAX) 70 MG tablet, Take 70 mg by mouth once a week., Disp: , Rfl:    ALPRAZolam (XANAX) 1 MG tablet, Take 1 mg by mouth at bedtime. May take additional 1 mg during the day if needed, Disp: , Rfl:    atorvastatin (LIPITOR) 40 MG tablet, Take 40 mg by mouth at bedtime. , Disp: , Rfl:    benzonatate (TESSALON) 100 MG capsule, Take 100 mg by mouth 3 (three) times daily as needed for cough., Disp: , Rfl:    CARBOPLATIN IV, Inject into the vein every 21 ( twenty-one) days., Disp: , Rfl:    DOCEtaxel (TAXOTERE IV), Inject into the vein every 21 ( twenty-one) days., Disp: , Rfl:    FLUoxetine (PROZAC) 40 MG capsule, Take 40 mg by mouth daily., Disp: , Rfl:    Fluticasone-Umeclidin-Vilant (TRELEGY ELLIPTA) 100-62.5-25 MCG/ACT AEPB, Inhale 1 each into the lungs daily., Disp: 60 each, Rfl: 2   hydrochlorothiazide (HYDRODIURIL) 25 MG tablet, Take 1 tablet (25 mg total) by mouth daily., Disp: 90 tablet, Rfl: 3   lidocaine-prilocaine (EMLA) cream, Apply a quarter-sized amount to port a cath site and cover with plastic wrap one hour prior to infusion appointments, Disp: 30 g, Rfl: 3   lisinopril (ZESTRIL) 10 MG tablet, Take 10 mg by mouth daily as needed (  blood pressure of 170/99)., Disp: , Rfl:    oxyCODONE (OXY IR/ROXICODONE) 5 MG immediate release tablet, Take 1 tablet (5 mg total) by mouth every 6 (six) hours as needed for moderate pain., Disp: 28 tablet, Rfl: 0   pegfilgrastim-cbqv (UDENYCA) 6 MG/0.6ML injection, Inject 6 mg into the skin  every 21 ( twenty-one) days., Disp: , Rfl:    potassium chloride SA (KLOR-CON M) 20 MEQ tablet, Take 1 tablet (20 mEq total) by mouth daily., Disp: 30 tablet, Rfl: 3   prochlorperazine (COMPAZINE) 10 MG tablet, Take 1 tablet (10 mg total) by mouth every 6 (six) hours as needed for nausea or vomiting., Disp: 30 tablet, Rfl: 1   traZODone (DESYREL) 100 MG tablet, Take 100 mg by mouth at bedtime., Disp: , Rfl:    Allergies: Allergies  Allergen Reactions   Zithromax [Azithromycin] Rash and Other (See Comments)    Blisters in mouth    Prednisone Nausea Only    Sick to stomach Can take in low dose    REVIEW OF SYSTEMS:   Review of Systems  Constitutional:  Negative for chills, fatigue and fever.  HENT:   Negative for lump/mass, mouth sores, nosebleeds, sore throat and trouble swallowing.   Eyes:  Negative for eye problems.  Respiratory:  Positive for cough. Negative for shortness of breath.   Cardiovascular:  Negative for chest pain, leg swelling and palpitations.  Gastrointestinal:  Positive for constipation and nausea. Negative for abdominal pain, diarrhea and vomiting.  Genitourinary:  Negative for bladder incontinence, difficulty urinating, dysuria, frequency, hematuria and nocturia.   Musculoskeletal:  Negative for arthralgias, back pain, flank pain, myalgias and neck pain.  Skin:  Negative for itching and rash.  Neurological:  Negative for dizziness, headaches and numbness.  Hematological:  Does not bruise/bleed easily.  Psychiatric/Behavioral:  Positive for depression. Negative for sleep disturbance and suicidal ideas. The patient is not nervous/anxious.   All other systems reviewed and are negative.    VITALS:   There were no vitals taken for this visit.  Wt Readings from Last 3 Encounters:  08/16/22 114 lb 9.6 oz (52 kg)  08/10/22 117 lb (53.1 kg)  08/02/22 114 lb 9.6 oz (52 kg)    There is no height or weight on file to calculate BMI.  Performance status (ECOG): 1 -  Symptomatic but completely ambulatory  PHYSICAL EXAM:   Physical Exam Vitals and nursing note reviewed. Exam conducted with a chaperone present.  Constitutional:      Appearance: Normal appearance.  Cardiovascular:     Rate and Rhythm: Normal rate and regular rhythm.     Pulses: Normal pulses.     Heart sounds: Normal heart sounds.  Pulmonary:     Effort: Pulmonary effort is normal.     Breath sounds: Normal breath sounds.  Abdominal:     Palpations: Abdomen is soft. There is no hepatomegaly, splenomegaly or mass.     Tenderness: There is no abdominal tenderness.  Musculoskeletal:     Right lower leg: No edema.     Left lower leg: No edema.  Lymphadenopathy:     Cervical: No cervical adenopathy.     Right cervical: No superficial, deep or posterior cervical adenopathy.    Left cervical: No superficial, deep or posterior cervical adenopathy.     Upper Body:     Right upper body: No supraclavicular or axillary adenopathy.     Left upper body: No supraclavicular or axillary adenopathy.  Neurological:     General:  No focal deficit present.     Mental Status: She is alert and oriented to person, place, and time.  Psychiatric:        Mood and Affect: Mood normal.        Behavior: Behavior normal.     LABS:      Latest Ref Rng & Units 08/16/2022    8:13 AM 08/02/2022   10:12 AM 07/26/2022    8:17 AM  CBC  WBC 4.0 - 10.5 K/uL 11.4  5.1  8.5   Hemoglobin 12.0 - 15.0 g/dL 16.1  09.6  04.5   Hematocrit 36.0 - 46.0 % 37.3  35.6  39.1   Platelets 150 - 400 K/uL 318  208  262       Latest Ref Rng & Units 08/16/2022    8:13 AM 08/02/2022   10:12 AM 07/26/2022    8:17 AM  CMP  Glucose 70 - 99 mg/dL 409  811  914   BUN 8 - 23 mg/dL 16  10  16    Creatinine 0.44 - 1.00 mg/dL 7.82  9.56  2.13   Sodium 135 - 145 mmol/L 134  130  133   Potassium 3.5 - 5.1 mmol/L 3.7  3.0  3.2   Chloride 98 - 111 mmol/L 102  94  98   CO2 22 - 32 mmol/L 22  26  26    Calcium 8.9 - 10.3 mg/dL 8.9  8.7   9.1   Total Protein 6.5 - 8.1 g/dL 6.8  6.3  7.0   Total Bilirubin 0.3 - 1.2 mg/dL 0.5  0.6  0.1   Alkaline Phos 38 - 126 U/L 79  90  93   AST 15 - 41 U/L 18  15  15    ALT 0 - 44 U/L 15  11  11       No results found for: "CEA1", "CEA" / No results found for: "CEA1", "CEA" No results found for: "PSA1" No results found for: "YQM578" No results found for: "CAN125"  No results found for: "TOTALPROTELP", "ALBUMINELP", "A1GS", "A2GS", "BETS", "BETA2SER", "GAMS", "MSPIKE", "SPEI" No results found for: "TIBC", "FERRITIN", "IRONPCTSAT" No results found for: "LDH"   STUDIES:   DG Chest 2 View  Result Date: 08/10/2022 CLINICAL DATA:  Right lower lobe lung cancer status post recent surgery EXAM: CHEST - 2 VIEW COMPARISON:  06/29/2022 FINDINGS: Frontal and lateral views of the chest demonstrate right chest wall port via internal jugular approach tip overlying superior vena cava. Stable postsurgical changes of right lower lobectomy, with stable small right pleural effusion. The left basilar consolidation seen on previous exam has cleared in the interim. No acute airspace disease. No pneumothorax. No acute bony abnormalities. IMPRESSION: 1. Stable postsurgical changes from right lower lobectomy, with stable small right pleural effusion. 2. No acute airspace disease. Resolution of the left basilar consolidation seen previously. Electronically Signed   By: Sharlet Salina M.D.   On: 08/10/2022 09:08   IR IMAGING GUIDED PORT INSERTION  Result Date: 07/21/2022 INDICATION: 67 year old with lung cancer. Port-A-Cath needed for chemotherapy administration. EXAM: FLUOROSCOPIC AND ULTRASOUND GUIDED PLACEMENT OF A SUBCUTANEOUS PORT COMPARISON:  None Available. MEDICATIONS: Moderate sedation ANESTHESIA/SEDATION: Moderate (conscious) sedation was employed during this procedure. A total of Versed 1 mg and fentanyl 50 mcg was administered intravenously at the order of the provider performing the procedure. Total  intra-service moderate sedation time: 28 minutes. Patient's level of consciousness and vital signs were monitored continuously by radiology nurse throughout the procedure under  the supervision of the provider performing the procedure. FLUOROSCOPY TIME:  Radiation Exposure Index (as provided by the fluoroscopic device): 3 mGy Kerma COMPLICATIONS: None immediate. PROCEDURE: The procedure, risks, benefits, and alternatives were explained to the patient. Questions regarding the procedure were encouraged and answered. The patient understands and consents to the procedure. Patient was placed supine on the interventional table. Ultrasound confirmed a patent right internal jugular vein. Ultrasound image was saved for documentation. The right chest and neck were cleaned with a skin antiseptic and a sterile drape was placed. Maximal barrier sterile technique was utilized including caps, mask, sterile gowns, sterile gloves, sterile drape, hand hygiene and skin antiseptic. The right neck was anesthetized with 1% lidocaine. Small incision was made in the right neck with a blade. Micropuncture set was placed in the right internal jugular vein with ultrasound guidance. The micropuncture wire was used for measurement purposes. The right chest was anesthetized with 1% lidocaine. #15 blade was used to make an incision and a subcutaneous port pocket was formed. 8 french Power Port was assembled. Subcutaneous tunnel was formed with a stiff tunneling device. The port catheter was brought through the subcutaneous tunnel. The port was placed in the subcutaneous pocket. The micropuncture set was exchanged for a peel-away sheath. The catheter was placed through the peel-away sheath and the tip was positioned at the superior cavoatrial junction. Catheter placement was confirmed with fluoroscopy. The port was accessed and flushed with heparinized saline. The port pocket was closed using two layers of absorbable sutures and Dermabond. The vein  skin site was closed using a single layer of absorbable suture and Dermabond. Sterile dressings were applied. Patient tolerated the procedure well without an immediate complication. Ultrasound and fluoroscopic images were taken and saved for this procedure. IMPRESSION: Placement of a subcutaneous power-injectable port device. Catheter tip at the superior cavoatrial junction. Electronically Signed   By: Richarda Overlie M.D.   On: 07/21/2022 17:44

## 2022-08-16 ENCOUNTER — Inpatient Hospital Stay: Payer: PPO | Attending: Hematology | Admitting: Hematology

## 2022-08-16 ENCOUNTER — Inpatient Hospital Stay: Payer: PPO

## 2022-08-16 VITALS — BP 146/83 | HR 85 | Temp 98.0°F | Resp 18

## 2022-08-16 VITALS — BP 126/68 | HR 108 | Temp 97.4°F | Resp 18 | Wt 114.6 lb

## 2022-08-16 DIAGNOSIS — Z803 Family history of malignant neoplasm of breast: Secondary | ICD-10-CM | POA: Insufficient documentation

## 2022-08-16 DIAGNOSIS — Z5189 Encounter for other specified aftercare: Secondary | ICD-10-CM | POA: Insufficient documentation

## 2022-08-16 DIAGNOSIS — F17210 Nicotine dependence, cigarettes, uncomplicated: Secondary | ICD-10-CM | POA: Diagnosis not present

## 2022-08-16 DIAGNOSIS — Z90721 Acquired absence of ovaries, unilateral: Secondary | ICD-10-CM | POA: Diagnosis not present

## 2022-08-16 DIAGNOSIS — Z8349 Family history of other endocrine, nutritional and metabolic diseases: Secondary | ICD-10-CM | POA: Insufficient documentation

## 2022-08-16 DIAGNOSIS — Z90710 Acquired absence of both cervix and uterus: Secondary | ICD-10-CM | POA: Diagnosis not present

## 2022-08-16 DIAGNOSIS — Z5111 Encounter for antineoplastic chemotherapy: Secondary | ICD-10-CM | POA: Insufficient documentation

## 2022-08-16 DIAGNOSIS — Z923 Personal history of irradiation: Secondary | ICD-10-CM | POA: Diagnosis not present

## 2022-08-16 DIAGNOSIS — C3431 Malignant neoplasm of lower lobe, right bronchus or lung: Secondary | ICD-10-CM | POA: Diagnosis not present

## 2022-08-16 DIAGNOSIS — E876 Hypokalemia: Secondary | ICD-10-CM | POA: Insufficient documentation

## 2022-08-16 DIAGNOSIS — Z8541 Personal history of malignant neoplasm of cervix uteri: Secondary | ICD-10-CM | POA: Insufficient documentation

## 2022-08-16 DIAGNOSIS — Z902 Acquired absence of lung [part of]: Secondary | ICD-10-CM | POA: Diagnosis not present

## 2022-08-16 DIAGNOSIS — Z95828 Presence of other vascular implants and grafts: Secondary | ICD-10-CM

## 2022-08-16 LAB — COMPREHENSIVE METABOLIC PANEL
ALT: 15 U/L (ref 0–44)
AST: 18 U/L (ref 15–41)
Albumin: 3.5 g/dL (ref 3.5–5.0)
Alkaline Phosphatase: 79 U/L (ref 38–126)
Anion gap: 10 (ref 5–15)
BUN: 16 mg/dL (ref 8–23)
CO2: 22 mmol/L (ref 22–32)
Calcium: 8.9 mg/dL (ref 8.9–10.3)
Chloride: 102 mmol/L (ref 98–111)
Creatinine, Ser: 0.94 mg/dL (ref 0.44–1.00)
GFR, Estimated: >60 mL/min (ref >60)
Glucose, Bld: 137 mg/dL — ABNORMAL HIGH (ref 70–99)
Potassium: 3.7 mmol/L (ref 3.5–5.1)
Sodium: 134 mmol/L — ABNORMAL LOW (ref 135–145)
Total Bilirubin: 0.5 mg/dL (ref 0.3–1.2)
Total Protein: 6.8 g/dL (ref 6.5–8.1)

## 2022-08-16 LAB — CBC WITH DIFFERENTIAL/PLATELET
Abs Immature Granulocytes: 0.08 10*3/uL — ABNORMAL HIGH (ref 0.00–0.07)
Basophils Absolute: 0.2 10*3/uL — ABNORMAL HIGH (ref 0.0–0.1)
Basophils Relative: 2 %
Eosinophils Absolute: 0 10*3/uL (ref 0.0–0.5)
Eosinophils Relative: 0 %
HCT: 37.3 % (ref 36.0–46.0)
Hemoglobin: 11.9 g/dL — ABNORMAL LOW (ref 12.0–15.0)
Immature Granulocytes: 1 %
Lymphocytes Relative: 15 %
Lymphs Abs: 1.7 10*3/uL (ref 0.7–4.0)
MCH: 27.9 pg (ref 26.0–34.0)
MCHC: 31.9 g/dL (ref 30.0–36.0)
MCV: 87.4 fL (ref 80.0–100.0)
Monocytes Absolute: 0.7 10*3/uL (ref 0.1–1.0)
Monocytes Relative: 6 %
Neutro Abs: 8.8 10*3/uL — ABNORMAL HIGH (ref 1.7–7.7)
Neutrophils Relative %: 76 %
Platelets: 318 10*3/uL (ref 150–400)
RBC: 4.27 MIL/uL (ref 3.87–5.11)
RDW: 16.8 % — ABNORMAL HIGH (ref 11.5–15.5)
WBC: 11.4 10*3/uL — ABNORMAL HIGH (ref 4.0–10.5)
nRBC: 0 % (ref 0.0–0.2)

## 2022-08-16 LAB — MAGNESIUM: Magnesium: 2 mg/dL (ref 1.7–2.4)

## 2022-08-16 MED ORDER — SODIUM CHLORIDE 0.9% FLUSH
10.0000 mL | Freq: Once | INTRAVENOUS | Status: AC
  Administered 2022-08-16: 10 mL via INTRAVENOUS

## 2022-08-16 MED ORDER — SODIUM CHLORIDE 0.9% FLUSH
10.0000 mL | INTRAVENOUS | Status: DC | PRN
  Administered 2022-08-16: 10 mL

## 2022-08-16 MED ORDER — SODIUM CHLORIDE 0.9 % IV SOLN
150.0000 mg | Freq: Once | INTRAVENOUS | Status: AC
  Administered 2022-08-16: 150 mg via INTRAVENOUS
  Filled 2022-08-16: qty 5

## 2022-08-16 MED ORDER — SODIUM CHLORIDE 0.9 % IV SOLN
75.0000 mg/m2 | Freq: Once | INTRAVENOUS | Status: AC
  Administered 2022-08-16: 119 mg via INTRAVENOUS
  Filled 2022-08-16: qty 11.9

## 2022-08-16 MED ORDER — SODIUM CHLORIDE 0.9 % IV SOLN
10.0000 mg | Freq: Once | INTRAVENOUS | Status: AC
  Administered 2022-08-16: 10 mg via INTRAVENOUS
  Filled 2022-08-16: qty 10

## 2022-08-16 MED ORDER — SODIUM CHLORIDE 0.9 % IV SOLN
409.2000 mg | Freq: Once | INTRAVENOUS | Status: AC
  Administered 2022-08-16: 410 mg via INTRAVENOUS
  Filled 2022-08-16: qty 41

## 2022-08-16 MED ORDER — HEPARIN SOD (PORK) LOCK FLUSH 100 UNIT/ML IV SOLN
500.0000 [IU] | Freq: Once | INTRAVENOUS | Status: AC | PRN
  Administered 2022-08-16: 500 [IU]

## 2022-08-16 MED ORDER — SODIUM CHLORIDE 0.9 % IV SOLN: Freq: Once | INTRAVENOUS | Status: AC

## 2022-08-16 MED ORDER — PALONOSETRON HCL INJECTION 0.25 MG/5ML
0.2500 mg | Freq: Once | INTRAVENOUS | Status: AC
  Administered 2022-08-16: 0.25 mg via INTRAVENOUS
  Filled 2022-08-16: qty 5

## 2022-08-16 NOTE — Patient Instructions (Signed)
[facility]  Discharge Instructions: Thank you for choosing [facility] to provide your oncology and hematology care.  If you have a lab appointment with the Cancer Center - please note that after April 8th, 2024, all labs will be drawn in the cancer center.  You do not have to check in or register with the main entrance as you have in the past but will complete your check-in in the cancer center.  Wear comfortable clothing and clothing appropriate for easy access to any Portacath or PICC line.   We strive to give you quality time with your provider. You may need to reschedule your appointment if you arrive late (15 or more minutes).  Arriving late affects you and other patients whose appointments are after yours.  Also, if you miss three or more appointments without notifying the office, you may be dismissed from the clinic at the provider's discretion.      For prescription refill requests, have your pharmacy contact our office and allow 72 hours for refills to be completed.    Today you received the following chemotherapy and/or immunotherapy agents Taxotere and Carboplatin.  Carboplatin Injection What is this medication? CARBOPLATIN (KAR boe pla tin) treats some types of cancer. It works by slowing down the growth of cancer cells. This medicine may be used for other purposes; ask your health care provider or pharmacist if you have questions. COMMON BRAND NAME(S): Paraplatin What should I tell my care team before I take this medication? They need to know if you have any of these conditions: Blood disorders Hearing problems Kidney disease Recent or ongoing radiation therapy An unusual or allergic reaction to carboplatin, cisplatin, other medications, foods, dyes, or preservatives Pregnant or trying to get pregnant Breast-feeding How should I use this medication? This medication is injected into a vein. It is given by your care team in a hospital or  clinic setting. Talk to your care team about the use of this medication in children. Special care may be needed. Overdosage: If you think you have taken too much of this medicine contact a poison control center or emergency room at once. NOTE: This medicine is only for you. Do not share this medicine with others. What if I miss a dose? Keep appointments for follow-up doses. It is important not to miss your dose. Call your care team if you are unable to keep an appointment. What may interact with this medication? Medications for seizures Some antibiotics, such as amikacin, gentamicin, neomycin, streptomycin, tobramycin Vaccines This list may not describe all possible interactions. Give your health care provider a list of all the medicines, herbs, non-prescription drugs, or dietary supplements you use. Also tell them if you smoke, drink alcohol, or use illegal drugs. Some items may interact with your medicine. What should I watch for while using this medication? Your condition will be monitored carefully while you are receiving this medication. You may need blood work while taking this medication. This medication may make you feel generally unwell. This is not uncommon, as chemotherapy can affect healthy cells as well as cancer cells. Report any side effects. Continue your course of treatment even though you feel ill unless your care team tells you to stop. In some cases, you may be given additional medications to help with side effects. Follow all directions for their use. This medication may increase your risk of getting an infection. Call your care team for advice if you get a fever, chills, sore throat,  or other symptoms of a cold or flu. Do not treat yourself. Try to avoid being around people who are sick. Avoid taking medications that contain aspirin, acetaminophen, ibuprofen, naproxen, or ketoprofen unless instructed by your care team. These medications may hide a fever. Be careful brushing or  flossing your teeth or using a toothpick because you may get an infection or bleed more easily. If you have any dental work done, tell your dentist you are receiving this medication. Talk to your care team if you wish to become pregnant or think you might be pregnant. This medication can cause serious birth defects. Talk to your care team about effective forms of contraception. Do not breast-feed while taking this medication. What side effects may I notice from receiving this medication? Side effects that you should report to your care team as soon as possible: Allergic reactions--skin rash, itching, hives, swelling of the face, lips, tongue, or throat Infection--fever, chills, cough, sore throat, wounds that don't heal, pain or trouble when passing urine, general feeling of discomfort or being unwell Low red blood cell level--unusual weakness or fatigue, dizziness, headache, trouble breathing Pain, tingling, or numbness in the hands or feet, muscle weakness, change in vision, confusion or trouble speaking, loss of balance or coordination, trouble walking, seizures Unusual bruising or bleeding Side effects that usually do not require medical attention (report to your care team if they continue or are bothersome): Hair loss Nausea Unusual weakness or fatigue Vomiting This list may not describe all possible side effects. Call your doctor for medical advice about side effects. You may report side effects to FDA at 1-800-FDA-1088. Where should I keep my medication? This medication is given in a hospital or clinic. It will not be stored at home. NOTE: This sheet is a summary. It may not cover all possible information. If you have questions about this medicine, talk to your doctor, pharmacist, or health care provider.  2023 Elsevier/Gold Standard (2007-05-20 00:00:00)   Docetaxel Injection What is this medication? DOCETAXEL (doe se TAX el) treats some types of cancer. It works by slowing down the  growth of cancer cells. This medicine may be used for other purposes; ask your health care provider or pharmacist if you have questions. COMMON BRAND NAME(S): Docefrez, Taxotere What should I tell my care team before I take this medication? They need to know if you have any of these conditions: Kidney disease Liver disease Low white blood cell levels Tingling of the fingers or toes or other nerve disorder An unusual or allergic reaction to docetaxel, polysorbate 80, other medications, foods, dyes, or preservatives Pregnant or trying to get pregnant Breast-feeding How should I use this medication? This medication is injected into a vein. It is given by your care team in a hospital or clinic setting. Talk to your care team about the use of this medication in children. Special care may be needed. Overdosage: If you think you have taken too much of this medicine contact a poison control center or emergency room at once. NOTE: This medicine is only for you. Do not share this medicine with others. What if I miss a dose? Keep appointments for follow-up doses. It is important not to miss your dose. Call your care team if you are unable to keep an appointment. What may interact with this medication? Do not take this medication with any of the following: Live virus vaccines This medication may also interact with the following: Certain antibiotics, such as clarithromycin, telithromycin Certain antivirals for HIV  or hepatitis Certain medications for fungal infections, such as itraconazole, ketoconazole, voriconazole Grapefruit juice Nefazodone Supplements, such as St. John's wort This list may not describe all possible interactions. Give your health care provider a list of all the medicines, herbs, non-prescription drugs, or dietary supplements you use. Also tell them if you smoke, drink alcohol, or use illegal drugs. Some items may interact with your medicine. What should I watch for while using  this medication? This medication may make you feel generally unwell. This is not uncommon as chemotherapy can affect healthy cells as well as cancer cells. Report any side effects. Continue your course of treatment even though you feel ill unless your care team tells you to stop. You may need blood work done while you are taking this medication. This medication can cause serious side effects and infusion reactions. To reduce the risk, your care team may give you other medications to take before receiving this one. Be sure to follow the directions from your care team. This medication may increase your risk of getting an infection. Call your care team for advice if you get a fever, chills, sore throat, or other symptoms of a cold or flu. Do not treat yourself. Try to avoid being around people who are sick. Avoid taking medications that contain aspirin, acetaminophen, ibuprofen, naproxen, or ketoprofen unless instructed by your care team. These medications may hide a fever. Be careful brushing or flossing your teeth or using a toothpick because you may get an infection or bleed more easily. If you have any dental work done, tell your dentist you are receiving this medication. Some products may contain alcohol. Ask your care team if this medication contains alcohol. Be sure to tell all care teams you are taking this medicine. Certain medications, like metronidazole and disulfiram, can cause an unpleasant reaction when taken with alcohol. The reaction includes flushing, headache, nausea, vomiting, sweating, and increased thirst. The reaction can last from 30 minutes to several hours. This medication may affect your coordination, reaction time, or judgement. Do not drive or operate machinery until you know how this medication affects you. Sit up or stand slowly to reduce the risk of dizzy or fainting spells. Drinking alcohol with this medication can increase the risk of these side effects. Talk to your care team  about your risk of cancer. You may be more at risk for certain types of cancer if you take this medication. Talk to your care team if you wish to become pregnant or think you might be pregnant. This medication can cause serious birth defects if taken during pregnancy or if you get pregnant within 2 months after stopping therapy. A negative pregnancy test is required before starting this medication. A reliable form of contraception is recommended while taking this medication and for 2 months after stopping it. Talk to your care team about reliable forms of contraception. Do not breast-feed while taking this medication and for 1 week after stopping therapy. Use a condom during sex and for 4 months after stopping therapy. Tell your care team right away if you think your partner might be pregnant. This medication can cause serious birth defects. This medication may cause infertility. Talk to your care team if you are concerned about your fertility. What side effects may I notice from receiving this medication? Side effects that you should report to your care team as soon as possible: Allergic reactions--skin rash, itching, hives, swelling of the face, lips, tongue, or throat Change in vision such as blurry  vision, seeing halos around lights, vision loss Infection--fever, chills, cough, or sore throat Infusion reactions--chest pain, shortness of breath or trouble breathing, feeling faint or lightheaded Low red blood cell level--unusual weakness or fatigue, dizziness, headache, trouble breathing Pain, tingling, or numbness in the hands or feet Painful swelling, warmth, or redness of the skin, blisters or sores at the infusion site Redness, blistering, peeling, or loosening of the skin, including inside the mouth Sudden or severe stomach pain, bloody diarrhea, fever, nausea, vomiting Swelling of the ankles, hands, or feet Tumor lysis syndrome (TLS)--nausea, vomiting, diarrhea, decrease in the amount of  urine, dark urine, unusual weakness or fatigue, confusion, muscle pain or cramps, fast or irregular heartbeat, joint pain Unusual bruising or bleeding Side effects that usually do not require medical attention (report to your care team if they continue or are bothersome): Change in nail shape, thickness, or color Change in taste Hair loss Increased tears This list may not describe all possible side effects. Call your doctor for medical advice about side effects. You may report side effects to FDA at 1-800-FDA-1088. Where should I keep my medication? This medication is given in a hospital or clinic. It will not be stored at home. NOTE: This sheet is a summary. It may not cover all possible information. If you have questions about this medicine, talk to your doctor, pharmacist, or health care provider.  2023 Elsevier/Gold Standard (2007-05-20 00:00:00)       To help prevent nausea and vomiting after your treatment, we encourage you to take your nausea medication as directed.  BELOW ARE SYMPTOMS THAT SHOULD BE REPORTED IMMEDIATELY: *FEVER GREATER THAN 100.4 F (38 C) OR HIGHER *CHILLS OR SWEATING *NAUSEA AND VOMITING THAT IS NOT CONTROLLED WITH YOUR NAUSEA MEDICATION *UNUSUAL SHORTNESS OF BREATH *UNUSUAL BRUISING OR BLEEDING *URINARY PROBLEMS (pain or burning when urinating, or frequent urination) *BOWEL PROBLEMS (unusual diarrhea, constipation, pain near the anus) TENDERNESS IN MOUTH AND THROAT WITH OR WITHOUT PRESENCE OF ULCERS (sore throat, sores in mouth, or a toothache) UNUSUAL RASH, SWELLING OR PAIN  UNUSUAL VAGINAL DISCHARGE OR ITCHING   Items with * indicate a potential emergency and should be followed up as soon as possible or go to the Emergency Department if any problems should occur.  Please show the CHEMOTHERAPY ALERT CARD or IMMUNOTHERAPY ALERT CARD at check-in to the Emergency Department and triage nurse.  Should you have questions after your visit or need to cancel or  reschedule your appointment, please contact [facility] AT [facility] [phone]  and follow the prompts.  Office hours are 8:00 a.m. to 4:30 p.m. Monday - Friday. Please note that voicemails left after 4:00 p.m. may not be returned until the following business day.  We are closed weekends and major holidays. You have access to a nurse at all times for urgent questions. Please call the main number to the clinic [phone] and follow the prompts.  For any non-urgent questions, you may also contact your provider using MyChart. We now offer e-Visits for anyone 4 and older to request care online for non-urgent symptoms. For details visit [email or web address].   Also download the MyChart app! Go to the app store, search "MyChart", open the app, select [place], and log in with your MyChart username and password.

## 2022-08-16 NOTE — Progress Notes (Signed)
Carboplatin dose increase for today confirmed with MD.  T.O. Dr Carilyn Goodpasture, PharmD

## 2022-08-16 NOTE — Progress Notes (Signed)
Patient has been examined by Dr. Katragadda. Vital signs and labs have been reviewed by MD - ANC, Creatinine, LFTs, hemoglobin, and platelets are within treatment parameters per M.D. - pt may proceed with treatment.  Primary RN and pharmacy notified.  

## 2022-08-16 NOTE — Progress Notes (Signed)
Patient presents today for chemotherapy infusion of Taxotere and Carboplatin. Patient is in satisfactory condition with no new complaints voiced.  Vital signs are stable.  Labs reviewed by Dr. Ellin Saba during the office visit and all labs are within treatment parameters.  We will proceed with treatment per MD orders.   Patient tolerated treatment well with no complaints voiced.  Patient left ambulatory in stable condition.  Vital signs stable at discharge.  Follow up as scheduled.

## 2022-08-16 NOTE — Patient Instructions (Signed)
[facility] at [facility] Discharge Instructions   You were seen and examined today by Dr. Katragadda.  He reviewed the results of your lab work which are normal/stable.   We will proceed with your treatment today.  Return as scheduled.    Thank you for choosing [facility] at [facility] to provide your oncology and hematology care.  To afford each patient quality time with our provider, please arrive at least 15 minutes before your scheduled appointment time.   If you have a lab appointment with the Cancer Center please come in thru the Main Entrance and check in at the main information desk.  You need to re-schedule your appointment should you arrive 10 or more minutes late.  We strive to give you quality time with our providers, and arriving late affects you and other patients whose appointments are after yours.  Also, if you no show three or more times for appointments you may be dismissed from the clinic at the providers discretion.     Again, thank you for choosing [facility].  Our hope is that these requests will decrease the amount of time that you wait before being seen by our physicians.       _____________________________________________________________  Should you have questions after your visit to  Cancer Center, please contact our office at [phone] and follow the prompts.  Our office hours are 8:00 a.m. and 4:30 p.m. Monday - Friday.  Please note that voicemails left after 4:00 p.m. may not be returned until the following business day.  We are closed weekends and major holidays.  You do have access to a nurse 24-7, just call the main number to the clinic [phone] and do not press any options, hold on the line and a nurse will answer the phone.    For prescription refill requests, have your pharmacy contact our office and allow 72 hours.    Due to Covid, you will need to wear a mask upon entering  the hospital. If you do not have a mask, a mask will be given to you at the Main Entrance upon arrival. For doctor visits, patients may have 1 support person age 18 or older with them. For treatment visits, patients can not have anyone with them due to social distancing guidelines and our immunocompromised population.      

## 2022-08-18 ENCOUNTER — Inpatient Hospital Stay: Payer: PPO

## 2022-08-18 VITALS — BP 109/69 | HR 100 | Temp 99.0°F | Resp 18

## 2022-08-18 DIAGNOSIS — C3431 Malignant neoplasm of lower lobe, right bronchus or lung: Secondary | ICD-10-CM

## 2022-08-18 DIAGNOSIS — Z5111 Encounter for antineoplastic chemotherapy: Secondary | ICD-10-CM | POA: Diagnosis not present

## 2022-08-18 MED ORDER — PEGFILGRASTIM-FPGK 6 MG/0.6ML ~~LOC~~ SOSY
6.0000 mg | PREFILLED_SYRINGE | Freq: Once | SUBCUTANEOUS | Status: AC
  Administered 2022-08-18: 6 mg via SUBCUTANEOUS
  Filled 2022-08-18: qty 0.6

## 2022-08-18 NOTE — Progress Notes (Signed)
Patient tolerated injection with no complaints voiced. Site clean and dry with no bruising or swelling noted at site. See MAR for details. Band aid applied.  Patient stable during and after injection. VSS with discharge and left in satisfactory condition with no s/s of distress noted.  

## 2022-08-18 NOTE — Patient Instructions (Signed)
[facility]  Discharge Instructions: Thank you for choosing [facility] to provide your oncology and hematology care.  If you have a lab appointment with the Cancer Center - please note that after April 8th, 2024, all labs will be drawn in the cancer center.  You do not have to check in or register with the main entrance as you have in the past but will complete your check-in in the cancer center.  Wear comfortable clothing and clothing appropriate for easy access to any Portacath or PICC line.   We strive to give you quality time with your provider. You may need to reschedule your appointment if you arrive late (15 or more minutes).  Arriving late affects you and other patients whose appointments are after yours.  Also, if you miss three or more appointments without notifying the office, you may be dismissed from the clinic at the provider's discretion.      For prescription refill requests, have your pharmacy contact our office and allow 72 hours for refills to be completed.    Today you received the following [id number] return as scheduled.    To help prevent nausea and vomiting after your treatment, we encourage you to take your nausea medication as directed.  BELOW ARE SYMPTOMS THAT SHOULD BE REPORTED IMMEDIATELY: *FEVER GREATER THAN 100.4 F (38 C) OR HIGHER *CHILLS OR SWEATING *NAUSEA AND VOMITING THAT IS NOT CONTROLLED WITH YOUR NAUSEA MEDICATION *UNUSUAL SHORTNESS OF BREATH *UNUSUAL BRUISING OR BLEEDING *URINARY PROBLEMS (pain or burning when urinating, or frequent urination) *BOWEL PROBLEMS (unusual diarrhea, constipation, pain near the anus) TENDERNESS IN MOUTH AND THROAT WITH OR WITHOUT PRESENCE OF ULCERS (sore throat, sores in mouth, or a toothache) UNUSUAL RASH, SWELLING OR PAIN  UNUSUAL VAGINAL DISCHARGE OR ITCHING   Items with * indicate a potential emergency and should be followed up as soon as possible or go to the Emergency  Department if any problems should occur.  Please show the CHEMOTHERAPY ALERT CARD or IMMUNOTHERAPY ALERT CARD at check-in to the Emergency Department and triage nurse.  Should you have questions after your visit or need to cancel or reschedule your appointment, please contact [facility] CENTER AT [facility] [phone]  and follow the prompts.  Office hours are 8:00 a.m. to 4:30 p.m. Monday - Friday. Please note that voicemails left after 4:00 p.m. may not be returned until the following business day.  We are closed weekends and major holidays. You have access to a nurse at all times for urgent questions. Please call the main number to the clinic [phone] and follow the prompts.  For any non-urgent questions, you may also contact your provider using MyChart. We now offer e-Visits for anyone 85 and older to request care online for non-urgent symptoms. For details visit [email or web address].   Also download the MyChart app! Go to the app store, search "MyChart", open the app, select [place], and log in with your MyChart username and password.

## 2022-09-06 NOTE — Progress Notes (Signed)
[facility] [address] [place] [address]    Clinic Day:  09/07/2022  Referring physician: Pearson Grippe, MD  Patient Care Team: Pearson Grippe, MD as PCP - General (Internal Medicine) Venita Lick, MD as Consulting Physician (Orthopedic Surgery) Rourk, Gerrit Friends, MD as Consulting Physician (Gastroenterology) Doreatha Massed, MD as Medical Oncologist (Medical Oncology) Therese Sarah, RN as Oncology Nurse Navigator (Medical Oncology)   ASSESSMENT & PLAN:   Assessment: 1.  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 - 18 pound weight loss in the last 3 months due to decreased appetite - Chest x-ray on 04/17/2022: Mass in the right lower lobe. - CT chest (04/26/2022): 3.8 cm cavitary mass in the anterior right lower lobe abutting the major fissure.  4 mm subpleural nodule in the medial right upper lobe is nonspecific.  No adenopathy on noncontrast exam. - PET scan (05/06/2022): 3.8 x 2.3 cm hypermetabolic right lower lobe mass SUV 17.8.  Mildly metabolic asymmetric right hilar nodal tissue with SUV 4.2.  Prominent precarinal lymph node 9 mm with SUV 2.2. - Brain MRI (05/19/2022): No evidence of intracranial metastatic disease. - Bronchoscopy and biopsy by Dr. Tonia Brooms. - Pathology: Lymph node 11 R, 7 with no malignant cells.  Right lower lobe cavitary mass FNA consistent with squamous cell carcinoma, keratinizing. - 06/14/2022: Robotic assisted right lower lobectomy, lymph node dissection by Dr. Dorris Fetch. - Pathology: 4.3 x 3.9 x 2.8 cm right lower lobe squamous cell carcinoma, margins negative to.  Negative visceral pleural invasion and lymphovascular invasion.  0/18 lymph nodes involved.  Nodal stations examined include 4, 7, 8, 9, 10, 11, 12.  PT2b pN0. - Cycle 1 of docetaxel and carboplatin on 07/26/2022 - NGS: PD-L1 TPS 30%, TMB-high.  Negative for ALK/EGFR/BRAF/KRAS/MET/RET/ROS1.   Positive for PIK3CA and T p53. - She will be offered pembrolizumab for 1 year after completion of adjuvant chemotherapy.   2.  Social/family history: - Lives at home with her husband [name]. - She has been on disability since she was diagnosed with cervical cancer.  Prior to that she worked at a [employer].  No chemical exposure. - She is independent of ADLs and IADLs. - Current active smoker and started smoking 1 pack/day at age 81. - Maternal aunt had breast cancer.   3.  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 - XRT 30 Gray in 5 fractions to the vaginal cuff from 02/22/2017 through 03/22/2017.    Plan: 1.  Stage IIa (T2b N0) right lower lobe squamous cell carcinoma: - She tolerated cycle 2 of docetaxel and carboplatin reasonably well. - She reported feeling wiped out for 1 to 2 weeks.  Most of her hair has come off. - Labs today: Normal LFTs and creatinine.  CBC grossly normal with mild anemia from myelosuppression. - She will proceed with cycle 3 today without any dose modifications. - RTC 3 weeks for cycle 4. - She reports port site is occasionally painful on certain movements.  No erythema or sign of infection.  Closely monitor.  If it continues to be painful, will discontinue port after cycle 4 and may do Keytruda every 3 to 6 weeks peripherally.   2.  Hypokalemia: - Continue potassium 20 mEq daily.  Potassium is normal today.    Orders Placed This Encounter  Procedures   CT Chest  W Contrast    Standing Status:   Future    Standing Expiration Date:   09/07/2023    Order Specific Question:   If indicated for the ordered procedure, I authorize the administration of contrast media per Radiology protocol    Answer:   Yes    Order Specific Question:   Does the patient have a contrast media/X-ray dye allergy?    Answer:   No    Order Specific Question:   Preferred imaging  location?    Answer:   [facility]      I,Katie Daubenspeck,acting as a scribe for Doreatha Massed, MD.,have documented all relevant documentation on the behalf of Doreatha Massed, MD,as directed by  Doreatha Massed, MD while in the presence of Doreatha Massed, MD.   I, Doreatha Massed MD, have reviewed the above documentation for accuracy and completeness, and I agree with the above.   Doreatha Massed, MD   5/28/202411:22 AM  CHIEF COMPLAINT:   Diagnosis: RLL lung cancer    Cancer Staging  Primary squamous cell carcinoma of lower lobe of right lung (HCC) Staging form: Lung, AJCC 8th Edition - Clinical stage from 05/23/2022: Stage IIA (cT2b, cN0, cM0) - Unsigned    Prior Therapy: RLL lobectomy and LND, 06/14/22 (Dr. Dorris Fetch)   Current Therapy:  Adjuvant chemotherapy with carboplatin and docetaxel    HISTORY OF PRESENT ILLNESS:   Oncology History  Primary squamous cell carcinoma of lower lobe of right lung (HCC)  05/23/2022 Initial Diagnosis   Primary squamous cell carcinoma of lower lobe of right lung   07/26/2022 -  Chemotherapy   Patient is on Treatment Plan : LUNG Carboplatin (AUC 6) + Docetaxel (75) q21d x 4 cycles        INTERVAL HISTORY:   [name] is a 67 y.o. female presenting to clinic today for follow up of RLL lung cancer. She was last seen by me on 08/16/22.  Today, she states that she is doing well overall. Her appetite level is at 50%. Her energy level is at 25%.  PAST MEDICAL HISTORY:   Past Medical History: Past Medical History:  Diagnosis Date   Anxiety    Arthritis    spinal stenosis   Back disorder    "crooked spine"   Bulging lumbar disc    Cervical cancer (HCC)    Complication of anesthesia    ? hypotension 10/2016 at [facility]    Depression    Dyspnea    H/O degenerative disc disease    History of radiation therapy 02/22/17-03/22/17   vaginal cuff 30 Gy in 5 fractions   Hypertension     Hypothyroidism    patient taken off of hypothyroid med in 11/2016    Osteoporosis    Thyroid disease     Surgical History: Past Surgical History:  Procedure Laterality Date   ABDOMINAL EXPOSURE N/A 09/27/2018   Procedure: ABDOMINAL EXPOSURE;  Surgeon: Nada Libman, MD;  Location: [facility] OR;  Service: Vascular;  Laterality: N/A;   ABDOMINAL HYSTERECTOMY     ANTERIOR AND POSTERIOR REPAIR N/A 11/09/2016   Procedure: ANTERIOR (CYSTOCELE) AND POSTERIOR REPAIR (RECTOCELE);  Surgeon: Tilda Burrow, MD;  Location: [facility];  Service: Gynecology;  Laterality: N/A;   ANTERIOR LUMBAR FUSION Left 09/27/2018   Procedure: ANTERIOR LUMBAR FUSION L5-S1,;  Surgeon: Venita Lick, MD;  Location: [facility] OR;  Service: Orthopedics;  Laterality: Left;  4.5 HRS/ DR. Myra Gianotti ASSIST   BACK SURGERY     BILATERAL SALPINGECTOMY  Left 11/09/2016   Procedure: LEFT SALPINGECTOMY;  Surgeon: Tilda Burrow, MD;  Location: [facility];  Service: Gynecology;  Laterality: Left;   BRONCHIAL BIOPSY  05/13/2022   Procedure: BRONCHIAL BIOPSIES;  Surgeon: Josephine Igo, DO;  Location: [facility];  Service: Pulmonary;;   BRONCHIAL BRUSHINGS  05/13/2022   Procedure: BRONCHIAL BRUSHINGS;  Surgeon: Josephine Igo, DO;  [facility]: [facility];  Service: Pulmonary;;   BRONCHIAL NEEDLE ASPIRATION BIOPSY  05/13/2022   Procedure: BRONCHIAL NEEDLE ASPIRATION BIOPSIES;  Surgeon: Josephine Igo, DO;  [facility]: [facility];  Service: Pulmonary;;   CATARACT EXTRACTION W/PHACO Right 07/05/2016   Procedure: CATARACT EXTRACTION PHACO AND INTRAOCULAR LENS PLACEMENT (IOC);  Surgeon: Gemma Payor, MD;  Location: [facility];  Service: Ophthalmology;  Laterality: Right;  CDE: 15.41   CATARACT EXTRACTION W/PHACO Left 07/19/2016   Procedure: CATARACT EXTRACTION PHACO AND INTRAOCULAR LENS PLACEMENT LEFT EYE CDE= 12.65;  Surgeon: Gemma Payor, MD;  Location: [facility];  Service: Ophthalmology;  Laterality: Left;  left   COLONOSCOPY WITH PROPOFOL N/A 09/09/2021    Procedure: COLONOSCOPY WITH PROPOFOL;  Surgeon: Corbin Ade, MD;  Location: [facility];  Service: Endoscopy;  Laterality: N/A;  10:30am   EYE SURGERY     FINE NEEDLE ASPIRATION  05/13/2022   Procedure: FINE NEEDLE ASPIRATION (FNA) LINEAR;  Surgeon: Josephine Igo, DO;  Location: [facility];  Service: Pulmonary;;   INTERCOSTAL NERVE BLOCK  06/14/2022   Procedure: INTERCOSTAL NERVE BLOCK;  Surgeon: Loreli Slot, MD;  Location: [facility] OR;  Service: Thoracic;;   IR IMAGING GUIDED PORT INSERTION  07/21/2022   LUMBAR LAMINECTOMY/DECOMPRESSION MICRODISCECTOMY Left 09/27/2018   Procedure: L4-L5 Lumbar Laminectomy/Decompression;  Surgeon: Venita Lick, MD;  Location: [facility];  Service: Orthopedics;  Laterality: Left;   NODE DISSECTION Right 06/14/2022   Procedure: NODE DISSECTION;  Surgeon: Loreli Slot, MD;  Location: [facility] OR;  Service: Thoracic;  Laterality: Right;   POLYPECTOMY  09/09/2021   Procedure: POLYPECTOMY;  Surgeon: Corbin Ade, MD;  [facility]: [facility];  Service: Endoscopy;;   ROBOTIC PELVIC AND PARA-AORTIC LYMPH NODE DISSECTION N/A 01/11/2017   Procedure: XI ROBOTIC BILATERAL PELVIC LYMPH NODE DISSECTION;  Surgeon: Adolphus Birchwood, MD;  Location: [facility];  Service: Gynecology;  Laterality: N/A;   SALPINGOOPHORECTOMY Right 11/09/2016   Procedure: RIGHT SALPINGO OOPHORECTOMY;  Surgeon: Tilda Burrow, MD;  Location: [facility];  Service: Gynecology;  Laterality: Right;   TUBAL LIGATION     VAGINAL HYSTERECTOMY N/A 11/09/2016   Procedure: HYSTERECTOMY VAGINAL;  Surgeon: Tilda Burrow, MD;  Location: [facility];  Service: Gynecology;  Laterality: N/A;   VIDEO BRONCHOSCOPY WITH ENDOBRONCHIAL ULTRASOUND Right 05/13/2022   Procedure: VIDEO BRONCHOSCOPY WITH ENDOBRONCHIAL ULTRASOUND;  Surgeon: Josephine Igo, DO;  Location: [facility];  Service: Pulmonary;  Laterality: Right;    Social History: Social History   Socioeconomic History   Marital status: Legally Separated     Spouse name: Not on file   Number of children: 1   Years of education: Not on file   Highest education level: Not on file  Occupational History   Not on file  Tobacco Use   Smoking status: Every Day    Packs/day: 1.00    Years: 44.00    Additional pack years: 0.00    Total pack years: 44.00    Types: Cigarettes   Smokeless tobacco: Never   Tobacco comments:    Smokes 1/2 ppd per pt. 08/10/22  Vaping Use   Vaping  Use: Never used  Substance and Sexual Activity   Alcohol use: No    Comment: 01-06-2016 Per pt rarely, 02-05-2016 per pt no but 64yrs ago     Drug use: No    Comment: 02-05-2016 per pt no but about 40 yrs ago   Sexual activity: Not Currently    Birth control/protection: Surgical    Comment: hyst  Other Topics Concern   Not on file  Social History Narrative   Not on file   Social Determinants of Health   Financial Resource Strain: Unknown (01/20/2021)   Overall Financial Resource Strain (CARDIA)    Difficulty of Paying Living Expenses: Patient declined  Food Insecurity: No Food Insecurity (05/04/2022)   Hunger Vital Sign    Worried About Running Out of Food in the Last Year: Never true    Ran Out of Food in the Last Year: Never true  Transportation Needs: No Transportation Needs (05/04/2022)   PRAPARE - Administrator, Civil Service (Medical): No    Lack of Transportation (Non-Medical): No  Physical Activity: Inactive (01/20/2021)   Exercise Vital Sign    Days of Exercise per Week: 1 day    Minutes of Exercise per Session: 0 min  Stress: Stress Concern Present (01/20/2021)   [employer] of Occupational Health - Occupational Stress Questionnaire    Feeling of Stress : Very much  Social Connections: Moderately Isolated (01/20/2021)   Social Connection and Isolation Panel [NHANES]    Frequency of Communication with Friends and Family: Twice a week    Frequency of Social Gatherings with Friends and Family: Never    Attends Religious Services:  More than 4 times per year    Active Member of [employer] or Organizations: No    Attends Banker Meetings: Never    Marital Status: Married  Catering manager Violence: Not At Risk (05/04/2022)   Humiliation, Afraid, Rape, and Kick questionnaire    Fear of Current or Ex-Partner: No    Emotionally Abused: No    Physically Abused: No    Sexually Abused: No    Family History: Family History  Problem Relation Age of Onset   Hypertension Mother    Congenital heart disease Mother    Diabetes Mother    Thyroid disease Brother    Other Daughter        bowel issues   Colon cancer Neg Hx    Colon polyps Neg Hx     Current Medications:  Current Outpatient Medications:    alendronate (FOSAMAX) 70 MG tablet, Take 70 mg by mouth once a week., Disp: , Rfl:    ALPRAZolam (XANAX) 1 MG tablet, Take 1 mg by mouth at bedtime. May take additional 1 mg during the day if needed, Disp: , Rfl:    atorvastatin (LIPITOR) 40 MG tablet, Take 40 mg by mouth at bedtime. , Disp: , Rfl:    FLUoxetine (PROZAC) 40 MG capsule, Take 40 mg by mouth daily., Disp: , Rfl:    lidocaine-prilocaine (EMLA) cream, Apply a quarter-sized amount to port a cath site and cover with plastic wrap one hour prior to infusion appointments, Disp: 30 g, Rfl: 3   potassium chloride SA (KLOR-CON M) 20 MEQ tablet, Take 1 tablet (20 mEq total) by mouth daily., Disp: 30 tablet, Rfl: 3   traZODone (DESYREL) 100 MG tablet, Take 100 mg by mouth at bedtime., Disp: , Rfl:    acetaminophen (TYLENOL) 325 MG tablet, Take 650 mg by mouth every 6 (six)  hours as needed for headache., Disp: , Rfl:    albuterol (VENTOLIN HFA) 108 (90 Base) MCG/ACT inhaler, INHALE 2 PUFFS INTO THE LUNGS EVERY 6 HOURS AS NEEDED FOR WHEEZING OR SHORTNESS OF BREATH., Disp: 8.5 g, Rfl: 2   benzonatate (TESSALON) 100 MG capsule, Take 100 mg by mouth 3 (three) times daily as needed for cough., Disp: , Rfl:    CARBOPLATIN IV, Inject into the vein every 21 ( twenty-one)  days., Disp: , Rfl:    DOCEtaxel (TAXOTERE IV), Inject into the vein every 21 ( twenty-one) days., Disp: , Rfl:    Fluticasone-Umeclidin-Vilant (TRELEGY ELLIPTA) 100-62.5-25 MCG/ACT AEPB, Inhale 1 each into the lungs daily., Disp: 60 each, Rfl: 2   hydrochlorothiazide (HYDRODIURIL) 25 MG tablet, Take 1 tablet (25 mg total) by mouth daily., Disp: 90 tablet, Rfl: 3   lisinopril (ZESTRIL) 10 MG tablet, Take 10 mg by mouth daily as needed (blood pressure of 170/99)., Disp: , Rfl:    oxyCODONE (OXY IR/ROXICODONE) 5 MG immediate release tablet, Take 1 tablet (5 mg total) by mouth every 6 (six) hours as needed for moderate pain., Disp: 28 tablet, Rfl: 0   pegfilgrastim-cbqv (UDENYCA) 6 MG/0.6ML injection, Inject 6 mg into the skin every 21 ( twenty-one) days., Disp: , Rfl:    prochlorperazine (COMPAZINE) 10 MG tablet, Take 1 tablet (10 mg total) by mouth every 6 (six) hours as needed for nausea or vomiting., Disp: 30 tablet, Rfl: 1 No current facility-administered medications for this visit.  Facility-Administered Medications Ordered in Other Visits:    CARBOplatin (PARAPLATIN) 410 mg in sodium chloride 0.9 % 250 mL chemo infusion, 410 mg, Intravenous, Once, Doreatha Massed, MD   DOCEtaxel (TAXOTERE) 119 mg in sodium chloride 0.9 % 250 mL chemo infusion, 75 mg/m2 (Treatment Plan Recorded), Intravenous, Once, Doreatha Massed, MD   heparin lock flush 100 unit/mL, 500 Units, Intracatheter, Once PRN, Doreatha Massed, MD   sodium chloride flush (NS) 0.9 % injection 10 mL, 10 mL, Intracatheter, PRN, Doreatha Massed, MD   Allergies: Allergies  Allergen Reactions   Zithromax [Azithromycin] Rash and Other (See Comments)    Blisters in mouth    Prednisone Nausea Only    Sick to stomach Can take in low dose    REVIEW OF SYSTEMS:   Review of Systems  Constitutional:  Negative for chills, fatigue and fever.  HENT:   Negative for lump/mass, mouth sores, nosebleeds, sore throat and  trouble swallowing.   Eyes:  Negative for eye problems.  Respiratory:  Negative for cough and shortness of breath.   Cardiovascular:  Negative for chest pain, leg swelling and palpitations.  Gastrointestinal:  Negative for abdominal pain, constipation, diarrhea, nausea and vomiting.  Genitourinary:  Negative for bladder incontinence, difficulty urinating, dysuria, frequency, hematuria and nocturia.   Musculoskeletal:  Negative for arthralgias, back pain, flank pain, myalgias and neck pain.  Skin:  Negative for itching and rash.  Neurological:  Negative for dizziness, headaches and numbness.  Hematological:  Bruises/bleeds easily.  Psychiatric/Behavioral:  Negative for depression, sleep disturbance and suicidal ideas. The patient is not nervous/anxious.   All other systems reviewed and are negative.    VITALS:   Blood pressure (!) 156/82, pulse 82, temperature (!) 97.4 F (36.3 C), temperature source Tympanic, resp. rate 18, height 5' 5.5" (1.664 m), weight 117 lb 9.6 oz (53.3 kg), SpO2 95 %.  Wt Readings from Last 3 Encounters:  09/07/22 117 lb 9.6 oz (53.3 kg)  09/07/22 117 lb 3.2 oz (53.2 kg)  08/16/22 114 lb 9.6 oz (52 kg)    Body mass index is 19.27 kg/m.  Performance status (ECOG): 1 - Symptomatic but completely ambulatory  PHYSICAL EXAM:   Physical Exam Vitals and nursing note reviewed. Exam conducted with a chaperone present.  Constitutional:      Appearance: Normal appearance.  Cardiovascular:     Rate and Rhythm: Normal rate and regular rhythm.     Pulses: Normal pulses.     Heart sounds: Normal heart sounds.  Pulmonary:     Effort: Pulmonary effort is normal.     Breath sounds: Normal breath sounds.  Abdominal:     Palpations: Abdomen is soft. There is no hepatomegaly, splenomegaly or mass.     Tenderness: There is no abdominal tenderness.  Musculoskeletal:     Right lower leg: No edema.     Left lower leg: No edema.  Lymphadenopathy:     Cervical: No  cervical adenopathy.     Right cervical: No superficial, deep or posterior cervical adenopathy.    Left cervical: No superficial, deep or posterior cervical adenopathy.     Upper Body:     Right upper body: No supraclavicular or axillary adenopathy.     Left upper body: No supraclavicular or axillary adenopathy.  Neurological:     General: No focal deficit present.     Mental Status: She is alert and oriented to person, place, and time.  Psychiatric:        Mood and Affect: Mood normal.        Behavior: Behavior normal.     LABS:      Latest Ref Rng & Units 09/07/2022    9:01 AM 08/16/2022    8:13 AM 08/02/2022   10:12 AM  CBC  WBC 4.0 - 10.5 K/uL 9.0  11.4  5.1   Hemoglobin 12.0 - 15.0 g/dL 54.0  98.1  19.1   Hematocrit 36.0 - 46.0 % 34.1  37.3  35.6   Platelets 150 - 400 K/uL 199  318  208       Latest Ref Rng & Units 09/07/2022    9:01 AM 08/16/2022    8:13 AM 08/02/2022   10:12 AM  CMP  Glucose 70 - 99 mg/dL 478  295  621   BUN 8 - 23 mg/dL 16  16  10    Creatinine 0.44 - 1.00 mg/dL 3.08  6.57  8.46   Sodium 135 - 145 mmol/L 135  134  130   Potassium 3.5 - 5.1 mmol/L 4.0  3.7  3.0   Chloride 98 - 111 mmol/L 102  102  94   CO2 22 - 32 mmol/L 20  22  26    Calcium 8.9 - 10.3 mg/dL 9.0  8.9  8.7   Total Protein 6.5 - 8.1 g/dL 6.7  6.8  6.3   Total Bilirubin 0.3 - 1.2 mg/dL 0.4  0.5  0.6   Alkaline Phos 38 - 126 U/L 77  79  90   AST 15 - 41 U/L 16  18  15    ALT 0 - 44 U/L 16  15  11       No results found for: "CEA1", "CEA" / No results found for: "CEA1", "CEA" No results found for: "PSA1" No results found for: "NGE952" No results found for: "CAN125"  No results found for: "TOTALPROTELP", "ALBUMINELP", "A1GS", "A2GS", "BETS", "BETA2SER", "GAMS", "MSPIKE", "SPEI" No results found for: "TIBC", "FERRITIN", "IRONPCTSAT" No results found for: "LDH"   STUDIES:  DG Chest 2 View  Result Date: 08/10/2022 CLINICAL DATA:  Right lower lobe lung cancer status post recent  surgery EXAM: CHEST - 2 VIEW COMPARISON:  06/29/2022 FINDINGS: Frontal and lateral views of the chest demonstrate right chest wall port via internal jugular approach tip overlying superior vena cava. Stable postsurgical changes of right lower lobectomy, with stable small right pleural effusion. The left basilar consolidation seen on previous exam has cleared in the interim. No acute airspace disease. No pneumothorax. No acute bony abnormalities. IMPRESSION: 1. Stable postsurgical changes from right lower lobectomy, with stable small right pleural effusion. 2. No acute airspace disease. Resolution of the left basilar consolidation seen previously. Electronically Signed   By: Sharlet Salina M.D.   On: 08/10/2022 09:08

## 2022-09-07 ENCOUNTER — Inpatient Hospital Stay: Payer: PPO

## 2022-09-07 ENCOUNTER — Encounter: Payer: Self-pay | Admitting: Hematology

## 2022-09-07 ENCOUNTER — Inpatient Hospital Stay: Payer: PPO | Admitting: Hematology

## 2022-09-07 VITALS — BP 137/84 | HR 89 | Temp 96.6°F | Resp 20 | Wt 117.2 lb

## 2022-09-07 VITALS — BP 161/94 | HR 87 | Temp 96.9°F | Resp 18

## 2022-09-07 DIAGNOSIS — Z5111 Encounter for antineoplastic chemotherapy: Secondary | ICD-10-CM | POA: Diagnosis not present

## 2022-09-07 DIAGNOSIS — C3431 Malignant neoplasm of lower lobe, right bronchus or lung: Secondary | ICD-10-CM | POA: Diagnosis not present

## 2022-09-07 DIAGNOSIS — Z95828 Presence of other vascular implants and grafts: Secondary | ICD-10-CM

## 2022-09-07 LAB — CBC WITH DIFFERENTIAL/PLATELET
Abs Immature Granulocytes: 0.02 10*3/uL (ref 0.00–0.07)
Basophils Absolute: 0.1 10*3/uL (ref 0.0–0.1)
Basophils Relative: 1 %
Eosinophils Absolute: 0 10*3/uL (ref 0.0–0.5)
Eosinophils Relative: 0 %
HCT: 34.1 % — ABNORMAL LOW (ref 36.0–46.0)
Hemoglobin: 11.1 g/dL — ABNORMAL LOW (ref 12.0–15.0)
Immature Granulocytes: 0 %
Lymphocytes Relative: 20 %
Lymphs Abs: 1.8 10*3/uL (ref 0.7–4.0)
MCH: 29 pg (ref 26.0–34.0)
MCHC: 32.6 g/dL (ref 30.0–36.0)
MCV: 89 fL (ref 80.0–100.0)
Monocytes Absolute: 0.5 10*3/uL (ref 0.1–1.0)
Monocytes Relative: 6 %
Neutro Abs: 6.5 10*3/uL (ref 1.7–7.7)
Neutrophils Relative %: 73 %
Platelets: 199 10*3/uL (ref 150–400)
RBC: 3.83 MIL/uL — ABNORMAL LOW (ref 3.87–5.11)
RDW: 19.2 % — ABNORMAL HIGH (ref 11.5–15.5)
WBC: 9 10*3/uL (ref 4.0–10.5)
nRBC: 0 % (ref 0.0–0.2)

## 2022-09-07 LAB — COMPREHENSIVE METABOLIC PANEL
ALT: 16 U/L (ref 0–44)
AST: 16 U/L (ref 15–41)
Albumin: 3.5 g/dL (ref 3.5–5.0)
Alkaline Phosphatase: 77 U/L (ref 38–126)
Anion gap: 13 (ref 5–15)
BUN: 16 mg/dL (ref 8–23)
CO2: 20 mmol/L — ABNORMAL LOW (ref 22–32)
Calcium: 9 mg/dL (ref 8.9–10.3)
Chloride: 102 mmol/L (ref 98–111)
Creatinine, Ser: 0.88 mg/dL (ref 0.44–1.00)
GFR, Estimated: >60 mL/min (ref >60)
Glucose, Bld: 102 mg/dL — ABNORMAL HIGH (ref 70–99)
Potassium: 4 mmol/L (ref 3.5–5.1)
Sodium: 135 mmol/L (ref 135–145)
Total Bilirubin: 0.4 mg/dL (ref 0.3–1.2)
Total Protein: 6.7 g/dL (ref 6.5–8.1)

## 2022-09-07 LAB — MAGNESIUM: Magnesium: 2 mg/dL (ref 1.7–2.4)

## 2022-09-07 MED ORDER — SODIUM CHLORIDE 0.9% FLUSH
10.0000 mL | Freq: Once | INTRAVENOUS | Status: AC
  Administered 2022-09-07: 10 mL via INTRAVENOUS

## 2022-09-07 MED ORDER — SODIUM CHLORIDE 0.9 % IV SOLN
75.0000 mg/m2 | Freq: Once | INTRAVENOUS | Status: AC
  Administered 2022-09-07: 119 mg via INTRAVENOUS
  Filled 2022-09-07: qty 11.9

## 2022-09-07 MED ORDER — PALONOSETRON HCL INJECTION 0.25 MG/5ML
0.2500 mg | Freq: Once | INTRAVENOUS | Status: AC
  Administered 2022-09-07: 0.25 mg via INTRAVENOUS
  Filled 2022-09-07: qty 5

## 2022-09-07 MED ORDER — HEPARIN SOD (PORK) LOCK FLUSH 100 UNIT/ML IV SOLN
500.0000 [IU] | Freq: Once | INTRAVENOUS | Status: AC | PRN
  Administered 2022-09-07: 500 [IU]

## 2022-09-07 MED ORDER — SODIUM CHLORIDE 0.9 % IV SOLN
150.0000 mg | Freq: Once | INTRAVENOUS | Status: AC
  Administered 2022-09-07: 150 mg via INTRAVENOUS
  Filled 2022-09-07: qty 150

## 2022-09-07 MED ORDER — SODIUM CHLORIDE 0.9% FLUSH
10.0000 mL | INTRAVENOUS | Status: DC | PRN
  Administered 2022-09-07: 10 mL

## 2022-09-07 MED ORDER — SODIUM CHLORIDE 0.9 % IV SOLN
409.2000 mg | Freq: Once | INTRAVENOUS | Status: AC
  Administered 2022-09-07: 410 mg via INTRAVENOUS
  Filled 2022-09-07: qty 41

## 2022-09-07 MED ORDER — SODIUM CHLORIDE 0.9 % IV SOLN
10.0000 mg | Freq: Once | INTRAVENOUS | Status: AC
  Administered 2022-09-07: 10 mg via INTRAVENOUS
  Filled 2022-09-07: qty 10

## 2022-09-07 MED ORDER — SODIUM CHLORIDE 0.9 % IV SOLN: Freq: Once | INTRAVENOUS | Status: AC

## 2022-09-07 NOTE — Patient Instructions (Signed)
[facility] at [facility] Discharge Instructions   You were seen and examined today by Dr. Katragadda.  He reviewed the results of your lab work which are normal/stable.   We will proceed with your treatment today.  Return as scheduled.    Thank you for choosing [facility] at [facility] to provide your oncology and hematology care.  To afford each patient quality time with our provider, please arrive at least 15 minutes before your scheduled appointment time.   If you have a lab appointment with the Cancer Center please come in thru the Main Entrance and check in at the main information desk.  You need to re-schedule your appointment should you arrive 10 or more minutes late.  We strive to give you quality time with our providers, and arriving late affects you and other patients whose appointments are after yours.  Also, if you no show three or more times for appointments you may be dismissed from the clinic at the providers discretion.     Again, thank you for choosing [facility].  Our hope is that these requests will decrease the amount of time that you wait before being seen by our physicians.       _____________________________________________________________  Should you have questions after your visit to [facility], please contact our office at [phone] and follow the prompts.  Our office hours are 8:00 a.m. and 4:30 p.m. Monday - Friday.  Please note that voicemails left after 4:00 p.m. may not be returned until the following business day.  We are closed weekends and major holidays.  You do have access to a nurse 24-7, just call the main number to the clinic [phone] and do not press any options, hold on the line and a nurse will answer the phone.    For prescription refill requests, have your pharmacy contact our office and allow 72 hours.    Due to Covid, you will need to wear a mask upon entering  the hospital. If you do not have a mask, a mask will be given to you at the Main Entrance upon arrival. For doctor visits, patients may have 1 support person age 18 or older with them. For treatment visits, patients can not have anyone with them due to social distancing guidelines and our immunocompromised population.      

## 2022-09-07 NOTE — Progress Notes (Signed)
Patient presents today for Docetaxel and Carboplatin with follow up visit with Dr.Katragadda. Labs and vital signs within parameters for treatment.   Message received from A. Dareen Piano RN / Dr. Ellin Saba to proceed with treatment. Labs reviewed by MD.   Treatment given today per MD orders. Tolerated infusion without adverse affects. Vital signs stable. No complaints at this time. Discharged from clinic ambulatory in stable condition. Alert and oriented x 3. F/U with [facility] as scheduled.

## 2022-09-07 NOTE — Patient Instructions (Signed)
[facility]  Discharge Instructions: Thank you for choosing  Cancer Center to provide your oncology and hematology care.  If you have a lab appointment with the Cancer Center - please note that after April 8th, 2024, all labs will be drawn in the cancer center.  You do not have to check in or register with the main entrance as you have in the past but will complete your check-in in the cancer center.  Wear comfortable clothing and clothing appropriate for easy access to any Portacath or PICC line.   We strive to give you quality time with your provider. You may need to reschedule your appointment if you arrive late (15 or more minutes).  Arriving late affects you and other patients whose appointments are after yours.  Also, if you miss three or more appointments without notifying the office, you may be dismissed from the clinic at the provider's discretion.      For prescription refill requests, have your pharmacy contact our office and allow 72 hours for refills to be completed.    Today you received the following chemotherapy and/or immunotherapy agents Docetaxel, carboplatin.Docetaxel Injection What is this medication? DOCETAXEL (doe se TAX el) treats some types of cancer. It works by slowing down the growth of cancer cells. This medicine may be used for other purposes; ask your health care provider or pharmacist if you have questions. COMMON BRAND NAME(S): Docefrez, Taxotere What should I tell my care team before I take this medication? They need to know if you have any of these conditions: Kidney disease Liver disease Low white blood cell levels Tingling of the fingers or toes or other nerve disorder An unusual or allergic reaction to docetaxel, polysorbate 80, other medications, foods, dyes, or preservatives Pregnant or trying to get pregnant Breast-feeding How should I use this medication? This medication is injected into a vein. It is given by your  care team in a hospital or clinic setting. Talk to your care team about the use of this medication in children. Special care may be needed. Overdosage: If you think you have taken too much of this medicine contact a poison control center or emergency room at once. NOTE: This medicine is only for you. Do not share this medicine with others. What if I miss a dose? Keep appointments for follow-up doses. It is important not to miss your dose. Call your care team if you are unable to keep an appointment. What may interact with this medication? Do not take this medication with any of the following: Live virus vaccines This medication may also interact with the following: Certain antibiotics, such as clarithromycin, telithromycin Certain antivirals for HIV or hepatitis Certain medications for fungal infections, such as itraconazole, ketoconazole, voriconazole Grapefruit juice Nefazodone Supplements, such as St. John's wort This list may not describe all possible interactions. Give your health care provider a list of all the medicines, herbs, non-prescription drugs, or dietary supplements you use. Also tell them if you smoke, drink alcohol, or use illegal drugs. Some items may interact with your medicine. What should I watch for while using this medication? This medication may make you feel generally unwell. This is not uncommon as chemotherapy can affect healthy cells as well as cancer cells. Report any side effects. Continue your course of treatment even though you feel ill unless your care team tells you to stop. You may need blood work done while you are taking this medication. This medication can cause serious side effects  and infusion reactions. To reduce the risk, your care team may give you other medications to take before receiving this one. Be sure to follow the directions from your care team. This medication may increase your risk of getting an infection. Call your care team for advice if you  get a fever, chills, sore throat, or other symptoms of a cold or flu. Do not treat yourself. Try to avoid being around people who are sick. Avoid taking medications that contain aspirin, acetaminophen, ibuprofen, naproxen, or ketoprofen unless instructed by your care team. These medications may hide a fever. Be careful brushing or flossing your teeth or using a toothpick because you may get an infection or bleed more easily. If you have any dental work done, tell your dentist you are receiving this medication. Some products may contain alcohol. Ask your care team if this medication contains alcohol. Be sure to tell all care teams you are taking this medicine. Certain medications, like metronidazole and disulfiram, can cause an unpleasant reaction when taken with alcohol. The reaction includes flushing, headache, nausea, vomiting, sweating, and increased thirst. The reaction can last from 30 minutes to several hours. This medication may affect your coordination, reaction time, or judgement. Do not drive or operate machinery until you know how this medication affects you. Sit up or stand slowly to reduce the risk of dizzy or fainting spells. Drinking alcohol with this medication can increase the risk of these side effects. Talk to your care team about your risk of cancer. You may be more at risk for certain types of cancer if you take this medication. Talk to your care team if you wish to become pregnant or think you might be pregnant. This medication can cause serious birth defects if taken during pregnancy or if you get pregnant within 2 months after stopping therapy. A negative pregnancy test is required before starting this medication. A reliable form of contraception is recommended while taking this medication and for 2 months after stopping it. Talk to your care team about reliable forms of contraception. Do not breast-feed while taking this medication and for 1 week after stopping therapy. Use a condom  during sex and for 4 months after stopping therapy. Tell your care team right away if you think your partner might be pregnant. This medication can cause serious birth defects. This medication may cause infertility. Talk to your care team if you are concerned about your fertility. What side effects may I notice from receiving this medication? Side effects that you should report to your care team as soon as possible: Allergic reactions--skin rash, itching, hives, swelling of the face, lips, tongue, or throat Change in vision such as blurry vision, seeing halos around lights, vision loss Infection--fever, chills, cough, or sore throat Infusion reactions--chest pain, shortness of breath or trouble breathing, feeling faint or lightheaded Low red blood cell level--unusual weakness or fatigue, dizziness, headache, trouble breathing Pain, tingling, or numbness in the hands or feet Painful swelling, warmth, or redness of the skin, blisters or sores at the infusion site Redness, blistering, peeling, or loosening of the skin, including inside the mouth Sudden or severe stomach pain, bloody diarrhea, fever, nausea, vomiting Swelling of the ankles, hands, or feet Tumor lysis syndrome (TLS)--nausea, vomiting, diarrhea, decrease in the amount of urine, dark urine, unusual weakness or fatigue, confusion, muscle pain or cramps, fast or irregular heartbeat, joint pain Unusual bruising or bleeding Side effects that usually do not require medical attention (report to your care team if they  continue or are bothersome): Change in nail shape, thickness, or color Change in taste Hair loss Increased tears This list may not describe all possible side effects. Call your doctor for medical advice about side effects. You may report side effects to FDA at 1-800-FDA-1088. Where should I keep my medication? This medication is given in a hospital or clinic. It will not be stored at home. NOTE: This sheet is a summary. It  may not cover all possible information. If you have questions about this medicine, talk to your doctor, pharmacist, or health care provider.  2024 Elsevier/Gold Standard (2021-06-04 00:00:00)       To help prevent nausea and vomiting after your treatment, we encourage you to take your nausea medication as directed.  BELOW ARE SYMPTOMS THAT SHOULD BE REPORTED IMMEDIATELY: *FEVER GREATER THAN 100.4 F (38 C) OR HIGHER *CHILLS OR SWEATING *NAUSEA AND VOMITING THAT IS NOT CONTROLLED WITH YOUR NAUSEA MEDICATION *UNUSUAL SHORTNESS OF BREATH *UNUSUAL BRUISING OR BLEEDING *URINARY PROBLEMS (pain or burning when urinating, or frequent urination) *BOWEL PROBLEMS (unusual diarrhea, constipation, pain near the anus) TENDERNESS IN MOUTH AND THROAT WITH OR WITHOUT PRESENCE OF ULCERS (sore throat, sores in mouth, or a toothache) UNUSUAL RASH, SWELLING OR PAIN  UNUSUAL VAGINAL DISCHARGE OR ITCHING   Items with * indicate a potential emergency and should be followed up as soon as possible or go to the Emergency Department if any problems should occur.  Please show the CHEMOTHERAPY ALERT CARD or IMMUNOTHERAPY ALERT CARD at check-in to the Emergency Department and triage nurse.  Should you have questions after your visit or need to cancel or reschedule your appointment, please contact [facility] [phone]  and follow the prompts.  Office hours are 8:00 a.m. to 4:30 p.m. Monday - Friday. Please note that voicemails left after 4:00 p.m. may not be returned until the following business day.  We are closed weekends and major holidays. You have access to a nurse at all times for urgent questions. Please call the main number to the clinic [phone] and follow the prompts.  For any non-urgent questions, you may also contact your provider using MyChart. We now offer e-Visits for anyone 46 and older to request care online for non-urgent symptoms. For details visit [email or web address].    Also download the MyChart app! Go to the app store, search "MyChart", open the app, select , and log in with your MyChart username and password.

## 2022-09-07 NOTE — Progress Notes (Signed)
Patient has been examined by Dr. Katragadda. Vital signs and labs have been reviewed by MD - ANC, Creatinine, LFTs, hemoglobin, and platelets are within treatment parameters per M.D. - pt may proceed with treatment.  Primary RN and pharmacy notified.  

## 2022-09-09 ENCOUNTER — Other Ambulatory Visit: Payer: Self-pay

## 2022-09-09 ENCOUNTER — Inpatient Hospital Stay: Payer: PPO

## 2022-09-09 VITALS — BP 123/78 | HR 100 | Temp 97.9°F | Resp 18

## 2022-09-09 DIAGNOSIS — C3431 Malignant neoplasm of lower lobe, right bronchus or lung: Secondary | ICD-10-CM

## 2022-09-09 DIAGNOSIS — Z5111 Encounter for antineoplastic chemotherapy: Secondary | ICD-10-CM | POA: Diagnosis not present

## 2022-09-09 MED ORDER — PEGFILGRASTIM-FPGK 6 MG/0.6ML ~~LOC~~ SOSY
6.0000 mg | PREFILLED_SYRINGE | Freq: Once | SUBCUTANEOUS | Status: AC
  Administered 2022-09-09: 6 mg via SUBCUTANEOUS
  Filled 2022-09-09: qty 0.6

## 2022-09-09 NOTE — Patient Instructions (Signed)
[facility]  Discharge Instructions: Thank you for choosing [facility] to provide your oncology and hematology care.  If you have a lab appointment with the Cancer Center - please note that after April 8th, 2024, all labs will be drawn in the cancer center.  You do not have to check in or register with the main entrance as you have in the past but will complete your check-in in the cancer center.  Wear comfortable clothing and clothing appropriate for easy access to any Portacath or PICC line.   We strive to give you quality time with your provider. You may need to reschedule your appointment if you arrive late (15 or more minutes).  Arriving late affects you and other patients whose appointments are after yours.  Also, if you miss three or more appointments without notifying the office, you may be dismissed from the clinic at the provider's discretion.      For prescription refill requests, have your pharmacy contact our office and allow 72 hours for refills to be completed.    Today you received the following pegfilgrastim-fpgk return, return as scheduled.    To help prevent nausea and vomiting after your treatment, we encourage you to take your nausea medication as directed.  BELOW ARE SYMPTOMS THAT SHOULD BE REPORTED IMMEDIATELY: *FEVER GREATER THAN 100.4 F (38 C) OR HIGHER *CHILLS OR SWEATING *NAUSEA AND VOMITING THAT IS NOT CONTROLLED WITH YOUR NAUSEA MEDICATION *UNUSUAL SHORTNESS OF BREATH *UNUSUAL BRUISING OR BLEEDING *URINARY PROBLEMS (pain or burning when urinating, or frequent urination) *BOWEL PROBLEMS (unusual diarrhea, constipation, pain near the anus) TENDERNESS IN MOUTH AND THROAT WITH OR WITHOUT PRESENCE OF ULCERS (sore throat, sores in mouth, or a toothache) UNUSUAL RASH, SWELLING OR PAIN  UNUSUAL VAGINAL DISCHARGE OR ITCHING   Items with * indicate a potential emergency and should be followed up as soon as possible or go to the  Emergency Department if any problems should occur.  Please show the CHEMOTHERAPY ALERT CARD or IMMUNOTHERAPY ALERT CARD at check-in to the Emergency Department and triage nurse.  Should you have questions after your visit or need to cancel or reschedule your appointment, please contact [facility] [phone]  and follow the prompts.  Office hours are 8:00 a.m. to 4:30 p.m. Monday - Friday. Please note that voicemails left after 4:00 p.m. may not be returned until the following business day.  We are closed weekends and major holidays. You have access to a nurse at all times for urgent questions. Please call the main number to the clinic [phone] and follow the prompts.  For any non-urgent questions, you may also contact your provider using MyChart. We now offer e-Visits for anyone 57 and older to request care online for non-urgent symptoms. For details visit [email or web address].   Also download the MyChart app! Go to the app store, search "MyChart", open the app, select [place], and log in with your MyChart username and password.

## 2022-09-09 NOTE — Progress Notes (Signed)
Patient tolerated injection with no complaints voiced. Site clean and dry with no bruising or swelling noted at site. See MAR for details. Band aid applied.  Patient stable during and after injection. VSS with discharge and left in satisfactory condition with no s/s of distress noted.  

## 2022-09-15 ENCOUNTER — Ambulatory Visit: Payer: PPO | Admitting: Primary Care

## 2022-09-15 ENCOUNTER — Ambulatory Visit: Payer: PPO | Admitting: Nurse Practitioner

## 2022-09-16 ENCOUNTER — Ambulatory Visit: Payer: PPO | Admitting: Adult Health

## 2022-09-20 ENCOUNTER — Ambulatory Visit: Payer: PPO | Admitting: Nurse Practitioner

## 2022-09-27 NOTE — Progress Notes (Signed)
[facility] [address] [place] [address]    Clinic Day:  09/28/2022  Referring physician: Pearson Grippe, MD  Patient Care Team: Pearson Grippe, MD as PCP - General (Internal Medicine) Venita Lick, MD as Consulting Physician (Orthopedic Surgery) Rourk, Gerrit Friends, MD as Consulting Physician (Gastroenterology) Doreatha Massed, MD as Medical Oncologist (Medical Oncology) Therese Sarah, RN as Oncology Nurse Navigator (Medical Oncology)   ASSESSMENT & PLAN:   Assessment: 1.  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 - 18 pound weight loss in the last 3 months due to decreased appetite - Chest x-ray on 04/17/2022: Mass in the right lower lobe. - CT chest (04/26/2022): 3.8 cm cavitary mass in the anterior right lower lobe abutting the major fissure.  4 mm subpleural nodule in the medial right upper lobe is nonspecific.  No adenopathy on noncontrast exam. - PET scan (05/06/2022): 3.8 x 2.3 cm hypermetabolic right lower lobe mass SUV 17.8.  Mildly metabolic asymmetric right hilar nodal tissue with SUV 4.2.  Prominent precarinal lymph node 9 mm with SUV 2.2. - Brain MRI (05/19/2022): No evidence of intracranial metastatic disease. - Bronchoscopy and biopsy by Dr. Tonia Brooms. - Pathology: Lymph node 11 R, 7 with no malignant cells.  Right lower lobe cavitary mass FNA consistent with squamous cell carcinoma, keratinizing. - 06/14/2022: Robotic assisted right lower lobectomy, lymph node dissection by Dr. Dorris Fetch. - Pathology: 4.3 x 3.9 x 2.8 cm right lower lobe squamous cell carcinoma, margins negative to.  Negative visceral pleural invasion and lymphovascular invasion.  0/18 lymph nodes involved.  Nodal stations examined include 4, 7, 8, 9, 10, 11, 12.  PT2b pN0. - Cycle 1 of docetaxel and carboplatin on 07/26/2022 - NGS: PD-L1 TPS 30%, TMB-high.  Negative for ALK/EGFR/BRAF/KRAS/MET/RET/ROS1.   Positive for PIK3CA and T p53. - She will be offered pembrolizumab for 1 year after completion of adjuvant chemotherapy.   2.  Social/family history: - Lives at home with her husband [name]. - She has been on disability since she was diagnosed with cervical cancer.  Prior to that she worked at a [employer].  No chemical exposure. - She is independent of ADLs and IADLs. - Current active smoker and started smoking 1 pack/day at age 33. - Maternal aunt had breast cancer.   3.  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 - XRT 30 Gray in 5 fractions to the vaginal cuff from 02/22/2017 through 03/22/2017.    Plan: 1.  Stage IIa (T2b N0) right lower lobe squamous cell carcinoma: - She has tolerated cycle 3 of docetaxel and carboplatin very well. - She felt tired for 1 to 2 weeks which is stable.  No major GI side effects.  No neuropathy. - Labs today: Normal LFTs and creatinine.  CBC grossly normal. - She will proceed with cycle 4 today. - She will have CT scan of the chest done prior to next visit. - We also discussed that for patients who completed adjuvant chemotherapy, with negative EGFR/ALK mutations, and no contraindication to immune checkpoint inhibitors, pembrolizumab given every 3 or 6 weeks for up to 1 year has been shown to improve PFS. - She would like to be treated with immunotherapy with pembrolizumab.  She does not have any previously documented autoimmune disease and does not take any immunosuppressive medications.  We will start  it at next visit.   2.  Hypokalemia: - Continue potassium 20 mEq daily.  Potassium is 3.8 today.    No orders of the defined types were placed in this encounter.     I,Katie Daubenspeck,acting as a Neurosurgeon for Doreatha Massed, MD.,have documented all relevant documentation on the behalf of Doreatha Massed, MD,as directed by  Doreatha Massed, MD while in the presence of Doreatha Massed, MD.   I, Doreatha Massed MD, have reviewed the above documentation for accuracy and completeness, and I agree with the above.   Doreatha Massed, MD   6/18/202412:35 PM  CHIEF COMPLAINT:   Diagnosis: RLL lung cancer    Cancer Staging  Primary squamous cell carcinoma of lower lobe of right lung (HCC) Staging form: Lung, AJCC 8th Edition - Clinical stage from 05/23/2022: Stage IIA (cT2b, cN0, cM0) - Unsigned    Prior Therapy: RLL lobectomy and LND, 06/14/22 (Dr. Dorris Fetch)   Current Therapy:  Adjuvant chemotherapy with carboplatin and docetaxel    HISTORY OF PRESENT ILLNESS:   Oncology History  Primary squamous cell carcinoma of lower lobe of right lung (HCC)  05/23/2022 Initial Diagnosis   Primary squamous cell carcinoma of lower lobe of right lung   07/26/2022 -  Chemotherapy   Patient is on Treatment Plan : LUNG Carboplatin (AUC 6) + Docetaxel (75) q21d x 4 cycles        INTERVAL HISTORY:   [name] is a 67 y.o. female presenting to clinic today for follow up of RLL lung cancer. She was last seen by me on 09/07/22.  Today, she states that she is doing well overall. Her appetite level is at 85%. Her energy level is at 80%.  PAST MEDICAL HISTORY:   Past Medical History: Past Medical History:  Diagnosis Date   Anxiety    Arthritis    spinal stenosis   Back disorder    "crooked spine"   Bulging lumbar disc    Cervical cancer (HCC)    Complication of anesthesia    ? hypotension 10/2016 at [facility] surgery    Depression    Dyspnea    H/O degenerative disc disease    History of radiation therapy 02/22/17-03/22/17   vaginal cuff 30 Gy in 5 fractions   Hypertension    Hypothyroidism    patient taken off of hypothyroid med in 11/2016    Osteoporosis    Thyroid disease     Surgical History: Past Surgical History:  Procedure Laterality Date   ABDOMINAL EXPOSURE N/A 09/27/2018   Procedure:  ABDOMINAL EXPOSURE;  Surgeon: Nada Libman, MD;  Location: [facility] OR;  Service: Vascular;  Laterality: N/A;   ABDOMINAL HYSTERECTOMY     ANTERIOR AND POSTERIOR REPAIR N/A 11/09/2016   Procedure: ANTERIOR (CYSTOCELE) AND POSTERIOR REPAIR (RECTOCELE);  Surgeon: Tilda Burrow, MD;  Location: [facility] ORS;  Service: Gynecology;  Laterality: N/A;   ANTERIOR LUMBAR FUSION Left 09/27/2018   Procedure: ANTERIOR LUMBAR FUSION L5-S1,;  Surgeon: Venita Lick, MD;  Location: [facility] OR;  Service: Orthopedics;  Laterality: Left;  4.5 HRS/ DR. Myra Gianotti ASSIST   BACK SURGERY     BILATERAL SALPINGECTOMY Left 11/09/2016   Procedure: LEFT SALPINGECTOMY;  Surgeon: Tilda Burrow, MD;  Location: [facility];  Service: Gynecology;  Laterality: Left;   BRONCHIAL BIOPSY  05/13/2022   Procedure: BRONCHIAL BIOPSIES;  Surgeon: Josephine Igo, DO;  Location: [facility] END[facility];  Service: Pulmonary;;   BRONCHIAL BRUSHINGS  05/13/2022   Procedure: BRONCHIAL BRUSHINGS;  Surgeon: Tonia Brooms,  Rachel Bo, DO;  Location: [facility];  Service: Pulmonary;;   BRONCHIAL NEEDLE ASPIRATION BIOPSY  05/13/2022   Procedure: BRONCHIAL NEEDLE ASPIRATION BIOPSIES;  Surgeon: Josephine Igo, DO;  Location: [facility];  Service: Pulmonary;;   CATARACT EXTRACTION W/PHACO Right 07/05/2016   Procedure: CATARACT EXTRACTION PHACO AND INTRAOCULAR LENS PLACEMENT (IOC);  Surgeon: Gemma Payor, MD;  Location: [facility];  Service: Ophthalmology;  Laterality: Right;  CDE: 15.41   CATARACT EXTRACTION W/PHACO Left 07/19/2016   Procedure: CATARACT EXTRACTION PHACO AND INTRAOCULAR LENS PLACEMENT LEFT EYE CDE= 12.65;  Surgeon: Gemma Payor, MD;  Location: [facility];  Service: Ophthalmology;  Laterality: Left;  left   COLONOSCOPY WITH PROPOFOL N/A 09/09/2021   Procedure: COLONOSCOPY WITH PROPOFOL;  Surgeon: Corbin Ade, MD;  Location: [facility];  Service: Endoscopy;  Laterality: N/A;  10:30am   EYE SURGERY     FINE NEEDLE ASPIRATION  05/13/2022   Procedure: FINE NEEDLE ASPIRATION  (FNA) LINEAR;  Surgeon: Josephine Igo, DO;  Location: [facility];  Service: Pulmonary;;   INTERCOSTAL NERVE BLOCK  06/14/2022   Procedure: INTERCOSTAL NERVE BLOCK;  Surgeon: Loreli Slot, MD;  Location: [facility] OR;  Service: Thoracic;;   IR IMAGING GUIDED PORT INSERTION  07/21/2022   LUMBAR LAMINECTOMY/DECOMPRESSION MICRODISCECTOMY Left 09/27/2018   Procedure: L4-L5 Lumbar Laminectomy/Decompression;  Surgeon: Venita Lick, MD;  Location: [facility] OR;  Service: Orthopedics;  Laterality: Left;   NODE DISSECTION Right 06/14/2022   Procedure: NODE DISSECTION;  Surgeon: Loreli Slot, MD;  Location: [facility] OR;  Service: Thoracic;  Laterality: Right;   POLYPECTOMY  09/09/2021   Procedure: POLYPECTOMY;  Surgeon: Corbin Ade, MD;  [facility]: [facility];  Service: Endoscopy;;   ROBOTIC PELVIC AND PARA-AORTIC LYMPH NODE DISSECTION N/A 01/11/2017   Procedure: XI ROBOTIC BILATERAL PELVIC LYMPH NODE DISSECTION;  Surgeon: Adolphus Birchwood, MD;  Location: [facility] ORS;  Service: Gynecology;  Laterality: N/A;   SALPINGOOPHORECTOMY Right 11/09/2016   Procedure: RIGHT SALPINGO OOPHORECTOMY;  Surgeon: Tilda Burrow, MD;  Location: [facility];  Service: Gynecology;  Laterality: Right;   TUBAL LIGATION     VAGINAL HYSTERECTOMY N/A 11/09/2016   Procedure: HYSTERECTOMY VAGINAL;  Surgeon: Tilda Burrow, MD;  Location: [facility];  Service: Gynecology;  Laterality: N/A;   VIDEO BRONCHOSCOPY WITH ENDOBRONCHIAL ULTRASOUND Right 05/13/2022   Procedure: VIDEO BRONCHOSCOPY WITH ENDOBRONCHIAL ULTRASOUND;  Surgeon: Josephine Igo, DO;  Location: [facility];  Service: Pulmonary;  Laterality: Right;    Social History: Social History   Socioeconomic History   Marital status: Legally Separated    Spouse name: Not on file   Number of children: 1   Years of education: Not on file   Highest education level: Not on file  Occupational History   Not on file  Tobacco Use   Smoking status: Every Day    Packs/day: 1.00     Years: 44.00    Additional pack years: 0.00    Total pack years: 44.00    Types: Cigarettes   Smokeless tobacco: Never   Tobacco comments:    Smokes 1/2 ppd per pt. 08/10/22  Vaping Use   Vaping Use: Never used  Substance and Sexual Activity   Alcohol use: No    Comment: 01-06-2016 Per pt rarely, 02-05-2016 per pt no but 67yrs ago     Drug use: No    Comment: 02-05-2016 per pt no but about 40 yrs ago   Sexual activity: Not Currently    Birth control/protection: Surgical  Comment: hyst  Other Topics Concern   Not on file  Social History Narrative   Not on file   Social Determinants of Health   Financial Resource Strain: Unknown (01/20/2021)   Overall Financial Resource Strain (CARDIA)    Difficulty of Paying Living Expenses: Patient declined  Food Insecurity: No Food Insecurity (05/04/2022)   Hunger Vital Sign    Worried About Running Out of Food in the Last Year: Never true    Ran Out of Food in the Last Year: Never true  Transportation Needs: No Transportation Needs (05/04/2022)   PRAPARE - Administrator, Civil Service (Medical): No    Lack of Transportation (Non-Medical): No  Physical Activity: Inactive (01/20/2021)   Exercise Vital Sign    Days of Exercise per Week: 1 day    Minutes of Exercise per Session: 0 min  Stress: Stress Concern Present (01/20/2021)   [employer] of Occupational Health - Occupational Stress Questionnaire    Feeling of Stress : Very much  Social Connections: Moderately Isolated (01/20/2021)   Social Connection and Isolation Panel [NHANES]    Frequency of Communication with Friends and Family: Twice a week    Frequency of Social Gatherings with Friends and Family: Never    Attends Religious Services: More than 4 times per year    Active Member of [employer] or Organizations: No    Attends Banker Meetings: Never    Marital Status: Married  Catering manager Violence: Not At Risk (05/04/2022)   Humiliation, Afraid,  Rape, and Kick questionnaire    Fear of Current or Ex-Partner: No    Emotionally Abused: No    Physically Abused: No    Sexually Abused: No    Family History: Family History  Problem Relation Age of Onset   Hypertension Mother    Congenital heart disease Mother    Diabetes Mother    Thyroid disease Brother    Other Daughter        bowel issues   Colon cancer Neg Hx    Colon polyps Neg Hx     Current Medications:  Current Outpatient Medications:    acetaminophen (TYLENOL) 325 MG tablet, Take 650 mg by mouth every 6 (six) hours as needed for headache., Disp: , Rfl:    albuterol (VENTOLIN HFA) 108 (90 Base) MCG/ACT inhaler, INHALE 2 PUFFS INTO THE LUNGS EVERY 6 HOURS AS NEEDED FOR WHEEZING OR SHORTNESS OF BREATH., Disp: 8.5 g, Rfl: 2   alendronate (FOSAMAX) 70 MG tablet, Take 70 mg by mouth once a week., Disp: , Rfl:    ALPRAZolam (XANAX) 1 MG tablet, Take 1 mg by mouth at bedtime. May take additional 1 mg during the day if needed, Disp: , Rfl:    atorvastatin (LIPITOR) 40 MG tablet, Take 40 mg by mouth at bedtime. , Disp: , Rfl:    benzonatate (TESSALON) 100 MG capsule, Take 100 mg by mouth 3 (three) times daily as needed for cough., Disp: , Rfl:    CARBOPLATIN IV, Inject into the vein every 21 ( twenty-one) days., Disp: , Rfl:    DOCEtaxel (TAXOTERE IV), Inject into the vein every 21 ( twenty-one) days., Disp: , Rfl:    FLUoxetine (PROZAC) 40 MG capsule, Take 40 mg by mouth daily., Disp: , Rfl:    Fluticasone-Umeclidin-Vilant (TRELEGY ELLIPTA) 100-62.5-25 MCG/ACT AEPB, Inhale 1 each into the lungs daily., Disp: 60 each, Rfl: 2   hydrochlorothiazide (HYDRODIURIL) 25 MG tablet, Take 1 tablet (25 mg total) by mouth  daily., Disp: 90 tablet, Rfl: 3   lidocaine-prilocaine (EMLA) cream, Apply a quarter-sized amount to port a cath site and cover with plastic wrap one hour prior to infusion appointments, Disp: 30 g, Rfl: 3   lisinopril (ZESTRIL) 10 MG tablet, Take 10 mg by mouth daily as  needed (blood pressure of 170/99)., Disp: , Rfl:    oxyCODONE (OXY IR/ROXICODONE) 5 MG immediate release tablet, Take 1 tablet (5 mg total) by mouth every 6 (six) hours as needed for moderate pain., Disp: 28 tablet, Rfl: 0   pegfilgrastim-cbqv (UDENYCA) 6 MG/0.6ML injection, Inject 6 mg into the skin every 21 ( twenty-one) days., Disp: , Rfl:    potassium chloride SA (KLOR-CON M) 20 MEQ tablet, Take 1 tablet (20 mEq total) by mouth daily., Disp: 30 tablet, Rfl: 3   prochlorperazine (COMPAZINE) 10 MG tablet, Take 1 tablet (10 mg total) by mouth every 6 (six) hours as needed for nausea or vomiting., Disp: 30 tablet, Rfl: 1   traZODone (DESYREL) 100 MG tablet, Take 100 mg by mouth at bedtime., Disp: , Rfl:  No current facility-administered medications for this visit.  Facility-Administered Medications Ordered in Other Visits:    CARBOplatin (PARAPLATIN) 410 mg in sodium chloride 0.9 % 250 mL chemo infusion, 410 mg, Intravenous, Once, Doreatha Massed, MD   DOCEtaxel (TAXOTERE) 119 mg in sodium chloride 0.9 % 250 mL chemo infusion, 75 mg/m2 (Treatment Plan Recorded), Intravenous, Once, Doreatha Massed, MD   heparin lock flush 100 unit/mL, 500 Units, Intracatheter, Once PRN, Doreatha Massed, MD   sodium chloride flush (NS) 0.9 % injection 10 mL, 10 mL, Intracatheter, PRN, Doreatha Massed, MD   Allergies: Allergies  Allergen Reactions   Zithromax [Azithromycin] Rash and Other (See Comments)    Blisters in mouth    Prednisone Nausea Only    Sick to stomach Can take in low dose    REVIEW OF SYSTEMS:   Review of Systems  Constitutional:  Negative for chills, fatigue and fever.  HENT:   Negative for lump/mass, mouth sores, nosebleeds, sore throat and trouble swallowing.   Eyes:  Negative for eye problems.  Respiratory:  Positive for cough. Negative for shortness of breath.   Cardiovascular:  Negative for chest pain, leg swelling and palpitations.  Gastrointestinal:  Positive  for constipation. Negative for abdominal pain, diarrhea, nausea and vomiting.  Genitourinary:  Negative for bladder incontinence, difficulty urinating, dysuria, frequency, hematuria and nocturia.   Musculoskeletal:  Negative for arthralgias, back pain, flank pain, myalgias and neck pain.  Skin:  Negative for itching and rash.  Neurological:  Negative for dizziness, headaches and numbness.  Hematological:  Does not bruise/bleed easily.  Psychiatric/Behavioral:  Positive for depression. Negative for sleep disturbance and suicidal ideas. The patient is nervous/anxious.   All other systems reviewed and are negative.    VITALS:   There were no vitals taken for this visit.  Wt Readings from Last 3 Encounters:  09/28/22 118 lb 12.8 oz (53.9 kg)  09/07/22 117 lb 9.6 oz (53.3 kg)  09/07/22 117 lb 3.2 oz (53.2 kg)    There is no height or weight on file to calculate BMI.  Performance status (ECOG): 1 - Symptomatic but completely ambulatory  PHYSICAL EXAM:   Physical Exam Vitals and nursing note reviewed. Exam conducted with a chaperone present.  Constitutional:      Appearance: Normal appearance.  Cardiovascular:     Rate and Rhythm: Normal rate and regular rhythm.     Pulses: Normal pulses.  Heart sounds: Normal heart sounds.  Pulmonary:     Effort: Pulmonary effort is normal.     Breath sounds: Normal breath sounds.  Abdominal:     Palpations: Abdomen is soft. There is no hepatomegaly, splenomegaly or mass.     Tenderness: There is no abdominal tenderness.  Musculoskeletal:     Right lower leg: No edema.     Left lower leg: No edema.  Lymphadenopathy:     Cervical: No cervical adenopathy.     Right cervical: No superficial, deep or posterior cervical adenopathy.    Left cervical: No superficial, deep or posterior cervical adenopathy.     Upper Body:     Right upper body: No supraclavicular or axillary adenopathy.     Left upper body: No supraclavicular or axillary  adenopathy.  Neurological:     General: No focal deficit present.     Mental Status: She is alert and oriented to person, place, and time.  Psychiatric:        Mood and Affect: Mood normal.        Behavior: Behavior normal.     LABS:      Latest Ref Rng & Units 09/28/2022   10:23 AM 09/07/2022    9:01 AM 08/16/2022    8:13 AM  CBC  WBC 4.0 - 10.5 K/uL 8.1  9.0  11.4   Hemoglobin 12.0 - 15.0 g/dL 16.1  09.6  04.5   Hematocrit 36.0 - 46.0 % 33.4  34.1  37.3   Platelets 150 - 400 K/uL 182  199  318       Latest Ref Rng & Units 09/28/2022   10:23 AM 09/07/2022    9:01 AM 08/16/2022    8:13 AM  CMP  Glucose 70 - 99 mg/dL 409  811  914   BUN 8 - 23 mg/dL 12  16  16    Creatinine 0.44 - 1.00 mg/dL 7.82  9.56  2.13   Sodium 135 - 145 mmol/L 134  135  134   Potassium 3.5 - 5.1 mmol/L 3.8  4.0  3.7   Chloride 98 - 111 mmol/L 102  102  102   CO2 22 - 32 mmol/L 23  20  22    Calcium 8.9 - 10.3 mg/dL 8.9  9.0  8.9   Total Protein 6.5 - 8.1 g/dL 6.5  6.7  6.8   Total Bilirubin 0.3 - 1.2 mg/dL 0.7  0.4  0.5   Alkaline Phos 38 - 126 U/L 73  77  79   AST 15 - 41 U/L 15  16  18    ALT 0 - 44 U/L 13  16  15       No results found for: "CEA1", "CEA" / No results found for: "CEA1", "CEA" No results found for: "PSA1" No results found for: "YQM578" No results found for: "CAN125"  No results found for: "TOTALPROTELP", "ALBUMINELP", "A1GS", "A2GS", "BETS", "BETA2SER", "GAMS", "MSPIKE", "SPEI" No results found for: "TIBC", "FERRITIN", "IRONPCTSAT" No results found for: "LDH"   STUDIES:   No results found.

## 2022-09-28 ENCOUNTER — Encounter: Payer: Self-pay | Admitting: Hematology

## 2022-09-28 ENCOUNTER — Inpatient Hospital Stay: Payer: PPO

## 2022-09-28 ENCOUNTER — Inpatient Hospital Stay: Payer: PPO | Attending: Hematology

## 2022-09-28 ENCOUNTER — Inpatient Hospital Stay (HOSPITAL_BASED_OUTPATIENT_CLINIC_OR_DEPARTMENT_OTHER): Payer: PPO | Admitting: Hematology

## 2022-09-28 VITALS — BP 123/79 | HR 93 | Temp 97.8°F | Resp 18 | Wt 118.8 lb

## 2022-09-28 VITALS — BP 125/71 | HR 91 | Temp 97.3°F | Resp 18

## 2022-09-28 DIAGNOSIS — Z8349 Family history of other endocrine, nutritional and metabolic diseases: Secondary | ICD-10-CM | POA: Insufficient documentation

## 2022-09-28 DIAGNOSIS — E876 Hypokalemia: Secondary | ICD-10-CM | POA: Insufficient documentation

## 2022-09-28 DIAGNOSIS — Z803 Family history of malignant neoplasm of breast: Secondary | ICD-10-CM | POA: Diagnosis not present

## 2022-09-28 DIAGNOSIS — C3431 Malignant neoplasm of lower lobe, right bronchus or lung: Secondary | ICD-10-CM | POA: Diagnosis present

## 2022-09-28 DIAGNOSIS — Z5111 Encounter for antineoplastic chemotherapy: Secondary | ICD-10-CM | POA: Diagnosis present

## 2022-09-28 DIAGNOSIS — F17210 Nicotine dependence, cigarettes, uncomplicated: Secondary | ICD-10-CM | POA: Insufficient documentation

## 2022-09-28 DIAGNOSIS — Z923 Personal history of irradiation: Secondary | ICD-10-CM | POA: Insufficient documentation

## 2022-09-28 DIAGNOSIS — Z5189 Encounter for other specified aftercare: Secondary | ICD-10-CM | POA: Insufficient documentation

## 2022-09-28 DIAGNOSIS — Z902 Acquired absence of lung [part of]: Secondary | ICD-10-CM | POA: Diagnosis not present

## 2022-09-28 DIAGNOSIS — Z95828 Presence of other vascular implants and grafts: Secondary | ICD-10-CM

## 2022-09-28 LAB — COMPREHENSIVE METABOLIC PANEL
ALT: 13 U/L (ref 0–44)
AST: 15 U/L (ref 15–41)
Albumin: 3.6 g/dL (ref 3.5–5.0)
Alkaline Phosphatase: 73 U/L (ref 38–126)
Anion gap: 9 (ref 5–15)
BUN: 12 mg/dL (ref 8–23)
CO2: 23 mmol/L (ref 22–32)
Calcium: 8.9 mg/dL (ref 8.9–10.3)
Chloride: 102 mmol/L (ref 98–111)
Creatinine, Ser: 0.91 mg/dL (ref 0.44–1.00)
GFR, Estimated: >60 mL/min (ref >60)
Glucose, Bld: 131 mg/dL — ABNORMAL HIGH (ref 70–99)
Potassium: 3.8 mmol/L (ref 3.5–5.1)
Sodium: 134 mmol/L — ABNORMAL LOW (ref 135–145)
Total Bilirubin: 0.7 mg/dL (ref 0.3–1.2)
Total Protein: 6.5 g/dL (ref 6.5–8.1)

## 2022-09-28 LAB — MAGNESIUM: Magnesium: 1.9 mg/dL (ref 1.7–2.4)

## 2022-09-28 LAB — CBC WITH DIFFERENTIAL/PLATELET
Abs Immature Granulocytes: 0.02 10*3/uL (ref 0.00–0.07)
Basophils Absolute: 0.1 10*3/uL (ref 0.0–0.1)
Basophils Relative: 1 %
Eosinophils Absolute: 0 10*3/uL (ref 0.0–0.5)
Eosinophils Relative: 0 %
HCT: 33.4 % — ABNORMAL LOW (ref 36.0–46.0)
Hemoglobin: 10.9 g/dL — ABNORMAL LOW (ref 12.0–15.0)
Immature Granulocytes: 0 %
Lymphocytes Relative: 21 %
Lymphs Abs: 1.7 10*3/uL (ref 0.7–4.0)
MCH: 30.1 pg (ref 26.0–34.0)
MCHC: 32.6 g/dL (ref 30.0–36.0)
MCV: 92.3 fL (ref 80.0–100.0)
Monocytes Absolute: 0.4 10*3/uL (ref 0.1–1.0)
Monocytes Relative: 5 %
Neutro Abs: 5.8 10*3/uL (ref 1.7–7.7)
Neutrophils Relative %: 73 %
Platelets: 182 10*3/uL (ref 150–400)
RBC: 3.62 MIL/uL — ABNORMAL LOW (ref 3.87–5.11)
RDW: 20.7 % — ABNORMAL HIGH (ref 11.5–15.5)
WBC: 8.1 10*3/uL (ref 4.0–10.5)
nRBC: 0 % (ref 0.0–0.2)

## 2022-09-28 MED ORDER — SODIUM CHLORIDE 0.9 % IV SOLN: Freq: Once | INTRAVENOUS | Status: AC

## 2022-09-28 MED ORDER — SODIUM CHLORIDE 0.9 % IV SOLN
150.0000 mg | Freq: Once | INTRAVENOUS | Status: AC
  Administered 2022-09-28: 150 mg via INTRAVENOUS
  Filled 2022-09-28: qty 150

## 2022-09-28 MED ORDER — SODIUM CHLORIDE 0.9 % IV SOLN
409.2000 mg | Freq: Once | INTRAVENOUS | Status: AC
  Administered 2022-09-28: 410 mg via INTRAVENOUS
  Filled 2022-09-28: qty 41

## 2022-09-28 MED ORDER — SODIUM CHLORIDE 0.9% FLUSH
10.0000 mL | INTRAVENOUS | Status: DC | PRN
  Administered 2022-09-28: 10 mL via INTRAVENOUS

## 2022-09-28 MED ORDER — PALONOSETRON HCL INJECTION 0.25 MG/5ML
0.2500 mg | Freq: Once | INTRAVENOUS | Status: AC
  Administered 2022-09-28: 0.25 mg via INTRAVENOUS
  Filled 2022-09-28: qty 5

## 2022-09-28 MED ORDER — HEPARIN SOD (PORK) LOCK FLUSH 100 UNIT/ML IV SOLN
500.0000 [IU] | Freq: Once | INTRAVENOUS | Status: AC | PRN
  Administered 2022-09-28: 500 [IU]

## 2022-09-28 MED ORDER — SODIUM CHLORIDE 0.9 % IV SOLN
10.0000 mg | Freq: Once | INTRAVENOUS | Status: AC
  Administered 2022-09-28: 10 mg via INTRAVENOUS
  Filled 2022-09-28: qty 10

## 2022-09-28 MED ORDER — SODIUM CHLORIDE 0.9% FLUSH
10.0000 mL | INTRAVENOUS | Status: DC | PRN
  Administered 2022-09-28: 10 mL

## 2022-09-28 MED ORDER — SODIUM CHLORIDE 0.9 % IV SOLN
75.0000 mg/m2 | Freq: Once | INTRAVENOUS | Status: AC
  Administered 2022-09-28: 119 mg via INTRAVENOUS
  Filled 2022-09-28: qty 11.9

## 2022-09-28 NOTE — Progress Notes (Signed)
Patient presents today for chemotherapy infusion of Taxotere and Carboplatin. Patient is in satisfactory condition with no new complaints voiced.  Vital signs are stable.  Labs reviewed by Dr. Katragadda during the office visit and all labs are within treatment parameters.  We will proceed with treatment per MD orders.   Patient tolerated treatment well with no complaints voiced.  Patient left ambulatory in stable condition.  Vital signs stable at discharge.  Follow up as scheduled.    

## 2022-09-28 NOTE — Patient Instructions (Signed)
[facility]  Discharge Instructions: Thank you for choosing [facility] to provide your oncology and hematology care.  If you have a lab appointment with the Cancer Center - please note that after April 8th, 2024, all labs will be drawn in the cancer center.  You do not have to check in or register with the main entrance as you have in the past but will complete your check-in in the cancer center.  Wear comfortable clothing and clothing appropriate for easy access to any Portacath or PICC line.   We strive to give you quality time with your provider. You may need to reschedule your appointment if you arrive late (15 or more minutes).  Arriving late affects you and other patients whose appointments are after yours.  Also, if you miss three or more appointments without notifying the office, you may be dismissed from the clinic at the provider's discretion.      For prescription refill requests, have your pharmacy contact our office and allow 72 hours for refills to be completed.    Today you received the following chemotherapy and/or immunotherapy agents Taxotere and Carboplatin.  Carboplatin Injection What is this medication? CARBOPLATIN (KAR boe pla tin) treats some types of cancer. It works by slowing down the growth of cancer cells. This medicine may be used for other purposes; ask your health care provider or pharmacist if you have questions. COMMON BRAND NAME(S): Paraplatin What should I tell my care team before I take this medication? They need to know if you have any of these conditions: Blood disorders Hearing problems Kidney disease Recent or ongoing radiation therapy An unusual or allergic reaction to carboplatin, cisplatin, other medications, foods, dyes, or preservatives Pregnant or trying to get pregnant Breast-feeding How should I use this medication? This medication is injected into a vein. It is given by your care team in a hospital or  clinic setting. Talk to your care team about the use of this medication in children. Special care may be needed. Overdosage: If you think you have taken too much of this medicine contact a poison control center or emergency room at once. NOTE: This medicine is only for you. Do not share this medicine with others. What if I miss a dose? Keep appointments for follow-up doses. It is important not to miss your dose. Call your care team if you are unable to keep an appointment. What may interact with this medication? Medications for seizures Some antibiotics, such as amikacin, gentamicin, neomycin, streptomycin, tobramycin Vaccines This list may not describe all possible interactions. Give your health care provider a list of all the medicines, herbs, non-prescription drugs, or dietary supplements you use. Also tell them if you smoke, drink alcohol, or use illegal drugs. Some items may interact with your medicine. What should I watch for while using this medication? Your condition will be monitored carefully while you are receiving this medication. You may need blood work while taking this medication. This medication may make you feel generally unwell. This is not uncommon, as chemotherapy can affect healthy cells as well as cancer cells. Report any side effects. Continue your course of treatment even though you feel ill unless your care team tells you to stop. In some cases, you may be given additional medications to help with side effects. Follow all directions for their use. This medication may increase your risk of getting an infection. Call your care team for advice if you get a fever, chills, sore throat,  or other symptoms of a cold or flu. Do not treat yourself. Try to avoid being around people who are sick. Avoid taking medications that contain aspirin, acetaminophen, ibuprofen, naproxen, or ketoprofen unless instructed by your care team. These medications may hide a fever. Be careful brushing or  flossing your teeth or using a toothpick because you may get an infection or bleed more easily. If you have any dental work done, tell your dentist you are receiving this medication. Talk to your care team if you wish to become pregnant or think you might be pregnant. This medication can cause serious birth defects. Talk to your care team about effective forms of contraception. Do not breast-feed while taking this medication. What side effects may I notice from receiving this medication? Side effects that you should report to your care team as soon as possible: Allergic reactions--skin rash, itching, hives, swelling of the face, lips, tongue, or throat Infection--fever, chills, cough, sore throat, wounds that don't heal, pain or trouble when passing urine, general feeling of discomfort or being unwell Low red blood cell level--unusual weakness or fatigue, dizziness, headache, trouble breathing Pain, tingling, or numbness in the hands or feet, muscle weakness, change in vision, confusion or trouble speaking, loss of balance or coordination, trouble walking, seizures Unusual bruising or bleeding Side effects that usually do not require medical attention (report to your care team if they continue or are bothersome): Hair loss Nausea Unusual weakness or fatigue Vomiting This list may not describe all possible side effects. Call your doctor for medical advice about side effects. You may report side effects to FDA at 1-800-FDA-1088. Where should I keep my medication? This medication is given in a hospital or clinic. It will not be stored at home. NOTE: This sheet is a summary. It may not cover all possible information. If you have questions about this medicine, talk to your doctor, pharmacist, or health care provider.  2024 Elsevier/Gold Standard (2021-07-21 00:00:00) Docetaxel Injection What is this medication? DOCETAXEL (doe se TAX el) treats some types of cancer. It works by slowing down the  growth of cancer cells. This medicine may be used for other purposes; ask your health care provider or pharmacist if you have questions. COMMON BRAND NAME(S): Docefrez, Taxotere What should I tell my care team before I take this medication? They need to know if you have any of these conditions: Kidney disease Liver disease Low white blood cell levels Tingling of the fingers or toes or other nerve disorder An unusual or allergic reaction to docetaxel, polysorbate 80, other medications, foods, dyes, or preservatives Pregnant or trying to get pregnant Breast-feeding How should I use this medication? This medication is injected into a vein. It is given by your care team in a hospital or clinic setting. Talk to your care team about the use of this medication in children. Special care may be needed. Overdosage: If you think you have taken too much of this medicine contact a poison control center or emergency room at once. NOTE: This medicine is only for you. Do not share this medicine with others. What if I miss a dose? Keep appointments for follow-up doses. It is important not to miss your dose. Call your care team if you are unable to keep an appointment. What may interact with this medication? Do not take this medication with any of the following: Live virus vaccines This medication may also interact with the following: Certain antibiotics, such as clarithromycin, telithromycin Certain antivirals for HIV or hepatitis  Certain medications for fungal infections, such as itraconazole, ketoconazole, voriconazole Grapefruit juice Nefazodone Supplements, such as St. John's wort This list may not describe all possible interactions. Give your health care provider a list of all the medicines, herbs, non-prescription drugs, or dietary supplements you use. Also tell them if you smoke, drink alcohol, or use illegal drugs. Some items may interact with your medicine. What should I watch for while using  this medication? This medication may make you feel generally unwell. This is not uncommon as chemotherapy can affect healthy cells as well as cancer cells. Report any side effects. Continue your course of treatment even though you feel ill unless your care team tells you to stop. You may need blood work done while you are taking this medication. This medication can cause serious side effects and infusion reactions. To reduce the risk, your care team may give you other medications to take before receiving this one. Be sure to follow the directions from your care team. This medication may increase your risk of getting an infection. Call your care team for advice if you get a fever, chills, sore throat, or other symptoms of a cold or flu. Do not treat yourself. Try to avoid being around people who are sick. Avoid taking medications that contain aspirin, acetaminophen, ibuprofen, naproxen, or ketoprofen unless instructed by your care team. These medications may hide a fever. Be careful brushing or flossing your teeth or using a toothpick because you may get an infection or bleed more easily. If you have any dental work done, tell your dentist you are receiving this medication. Some products may contain alcohol. Ask your care team if this medication contains alcohol. Be sure to tell all care teams you are taking this medicine. Certain medications, like metronidazole and disulfiram, can cause an unpleasant reaction when taken with alcohol. The reaction includes flushing, headache, nausea, vomiting, sweating, and increased thirst. The reaction can last from 30 minutes to several hours. This medication may affect your coordination, reaction time, or judgement. Do not drive or operate machinery until you know how this medication affects you. Sit up or stand slowly to reduce the risk of dizzy or fainting spells. Drinking alcohol with this medication can increase the risk of these side effects. Talk to your care team  about your risk of cancer. You may be more at risk for certain types of cancer if you take this medication. Talk to your care team if you wish to become pregnant or think you might be pregnant. This medication can cause serious birth defects if taken during pregnancy or if you get pregnant within 2 months after stopping therapy. A negative pregnancy test is required before starting this medication. A reliable form of contraception is recommended while taking this medication and for 2 months after stopping it. Talk to your care team about reliable forms of contraception. Do not breast-feed while taking this medication and for 1 week after stopping therapy. Use a condom during sex and for 4 months after stopping therapy. Tell your care team right away if you think your partner might be pregnant. This medication can cause serious birth defects. This medication may cause infertility. Talk to your care team if you are concerned about your fertility. What side effects may I notice from receiving this medication? Side effects that you should report to your care team as soon as possible: Allergic reactions--skin rash, itching, hives, swelling of the face, lips, tongue, or throat Change in vision such as blurry vision, seeing  halos around lights, vision loss Infection--fever, chills, cough, or sore throat Infusion reactions--chest pain, shortness of breath or trouble breathing, feeling faint or lightheaded Low red blood cell level--unusual weakness or fatigue, dizziness, headache, trouble breathing Pain, tingling, or numbness in the hands or feet Painful swelling, warmth, or redness of the skin, blisters or sores at the infusion site Redness, blistering, peeling, or loosening of the skin, including inside the mouth Sudden or severe stomach pain, bloody diarrhea, fever, nausea, vomiting Swelling of the ankles, hands, or feet Tumor lysis syndrome (TLS)--nausea, vomiting, diarrhea, decrease in the amount of  urine, dark urine, unusual weakness or fatigue, confusion, muscle pain or cramps, fast or irregular heartbeat, joint pain Unusual bruising or bleeding Side effects that usually do not require medical attention (report to your care team if they continue or are bothersome): Change in nail shape, thickness, or color Change in taste Hair loss Increased tears This list may not describe all possible side effects. Call your doctor for medical advice about side effects. You may report side effects to FDA at 1-800-FDA-1088. Where should I keep my medication? This medication is given in a hospital or clinic. It will not be stored at home. NOTE: This sheet is a summary. It may not cover all possible information. If you have questions about this medicine, talk to your doctor, pharmacist, or health care provider.  2024 Elsevier/Gold Standard (2021-06-04 00:00:00)     To help prevent nausea and vomiting after your treatment, we encourage you to take your nausea medication as directed.  BELOW ARE SYMPTOMS THAT SHOULD BE REPORTED IMMEDIATELY: *FEVER GREATER THAN 100.4 F (38 C) OR HIGHER *CHILLS OR SWEATING *NAUSEA AND VOMITING THAT IS NOT CONTROLLED WITH YOUR NAUSEA MEDICATION *UNUSUAL SHORTNESS OF BREATH *UNUSUAL BRUISING OR BLEEDING *URINARY PROBLEMS (pain or burning when urinating, or frequent urination) *BOWEL PROBLEMS (unusual diarrhea, constipation, pain near the anus) TENDERNESS IN MOUTH AND THROAT WITH OR WITHOUT PRESENCE OF ULCERS (sore throat, sores in mouth, or a toothache) UNUSUAL RASH, SWELLING OR PAIN  UNUSUAL VAGINAL DISCHARGE OR ITCHING   Items with * indicate a potential emergency and should be followed up as soon as possible or go to the Emergency Department if any problems should occur.  Please show the CHEMOTHERAPY ALERT CARD or IMMUNOTHERAPY ALERT CARD at check-in to the Emergency Department and triage nurse.  Should you have questions after your visit or need to cancel or  reschedule your appointment, please contact [facility] [phone]  and follow the prompts.  Office hours are 8:00 a.m. to 4:30 p.m. Monday - Friday. Please note that voicemails left after 4:00 p.m. may not be returned until the following business day.  We are closed weekends and major holidays. You have access to a nurse at all times for urgent questions. Please call the main number to the clinic [phone] and follow the prompts.  For any non-urgent questions, you may also contact your provider using MyChart. We now offer e-Visits for anyone 65 and older to request care online for non-urgent symptoms. For details visit [email or web address].   Also download the MyChart app! Go to the app store, search "MyChart", open the app, select [facility], and log in with your MyChart username and password.

## 2022-09-28 NOTE — Patient Instructions (Signed)
[facility] at [facility] Discharge Instructions   You were seen and examined today by Dr. Ellin Saba.  He reviewed the results of your lab work which are normal/stable.   We will proceed with your final chemotherapy treatment today.   He discussed with you starting on a drug called Keytruda, which is an immunotherapy drug. You would come to the clinic every 3 weeks for a total of one year to receive this medication.   We will repeat a CT scan prior to your next visit.   Return as scheduled.    Thank you for choosing [facility] at [facility] to provide your oncology and hematology care.  To afford each patient quality time with our provider, please arrive at least 15 minutes before your scheduled appointment time.   If you have a lab appointment with the Cancer Center please come in thru the Main Entrance and check in at the main information desk.  You need to re-schedule your appointment should you arrive 10 or more minutes late.  We strive to give you quality time with our providers, and arriving late affects you and other patients whose appointments are after yours.  Also, if you no show three or more times for appointments you may be dismissed from the clinic at the providers discretion.     Again, thank you for choosing [facility].  Our hope is that these requests will decrease the amount of time that you wait before being seen by our physicians.       _____________________________________________________________  Should you have questions after your visit to [facility], please contact our office at [phone] and follow the prompts.  Our office hours are 8:00 a.m. and 4:30 p.m. Monday - Friday.  Please note that voicemails left after 4:00 p.m. may not be returned until the following business day.  We are closed weekends and major holidays.  You do have access to a nurse 24-7, just call the main number to the  clinic [phone] and do not press any options, hold on the line and a nurse will answer the phone.    For prescription refill requests, have your pharmacy contact our office and allow 72 hours.    Due to Covid, you will need to wear a mask upon entering the hospital. If you do not have a mask, a mask will be given to you at the Main Entrance upon arrival. For doctor visits, patients may have 1 support person age 88 or older with them. For treatment visits, patients can not have anyone with them due to social distancing guidelines and our immunocompromised population.

## 2022-09-28 NOTE — Progress Notes (Signed)
Patients port flushed without difficulty.  Good blood return noted with no bruising or swelling noted at site.  VSS. Patient remains accessed for treatment.  

## 2022-09-30 ENCOUNTER — Inpatient Hospital Stay: Payer: PPO

## 2022-09-30 ENCOUNTER — Other Ambulatory Visit: Payer: Self-pay

## 2022-09-30 VITALS — BP 121/75 | HR 92 | Temp 97.5°F | Resp 18

## 2022-09-30 DIAGNOSIS — C3431 Malignant neoplasm of lower lobe, right bronchus or lung: Secondary | ICD-10-CM

## 2022-09-30 DIAGNOSIS — Z5111 Encounter for antineoplastic chemotherapy: Secondary | ICD-10-CM | POA: Diagnosis not present

## 2022-09-30 MED ORDER — PEGFILGRASTIM-FPGK 6 MG/0.6ML ~~LOC~~ SOSY
6.0000 mg | PREFILLED_SYRINGE | Freq: Once | SUBCUTANEOUS | Status: AC
  Administered 2022-09-30: 6 mg via SUBCUTANEOUS
  Filled 2022-09-30: qty 0.6

## 2022-09-30 NOTE — Patient Instructions (Signed)
[facility]  Discharge Instructions: Thank you for choosing [facility] to provide your oncology and hematology care.  If you have a lab appointment with the Cancer Center - please note that after April 8th, 2024, all labs will be drawn in the cancer center.  You do not have to check in or register with the main entrance as you have in the past but will complete your check-in in the cancer center.  Wear comfortable clothing and clothing appropriate for easy access to any Portacath or PICC line.   We strive to give you quality time with your provider. You may need to reschedule your appointment if you arrive late (15 or more minutes).  Arriving late affects you and other patients whose appointments are after yours.  Also, if you miss three or more appointments without notifying the office, you may be dismissed from the clinic at the provider's discretion.      For prescription refill requests, have your pharmacy contact our office and allow 72 hours for refills to be completed.    Today you received the following chemotherapy and/or immunotherapy agents Stimifend      To help prevent nausea and vomiting after your treatment, we encourage you to take your nausea medication as directed.  BELOW ARE SYMPTOMS THAT SHOULD BE REPORTED IMMEDIATELY: *FEVER GREATER THAN 100.4 F (38 C) OR HIGHER *CHILLS OR SWEATING *NAUSEA AND VOMITING THAT IS NOT CONTROLLED WITH YOUR NAUSEA MEDICATION *UNUSUAL SHORTNESS OF BREATH *UNUSUAL BRUISING OR BLEEDING *URINARY PROBLEMS (pain or burning when urinating, or frequent urination) *BOWEL PROBLEMS (unusual diarrhea, constipation, pain near the anus) TENDERNESS IN MOUTH AND THROAT WITH OR WITHOUT PRESENCE OF ULCERS (sore throat, sores in mouth, or a toothache) UNUSUAL RASH, SWELLING OR PAIN  UNUSUAL VAGINAL DISCHARGE OR ITCHING   Items with * indicate a potential emergency and should be followed up as soon as possible or go to the  Emergency Department if any problems should occur.  Please show the CHEMOTHERAPY ALERT CARD or IMMUNOTHERAPY ALERT CARD at check-in to the Emergency Department and triage nurse.  Should you have questions after your visit or need to cancel or reschedule your appointment, please contact [facility] AT [facility] [phone]  and follow the prompts.  Office hours are 8:00 a.m. to 4:30 p.m. Monday - Friday. Please note that voicemails left after 4:00 p.m. may not be returned until the following business day.  We are closed weekends and major holidays. You have access to a nurse at all times for urgent questions. Please call the main number to the clinic [phone] and follow the prompts.  For any non-urgent questions, you may also contact your provider using MyChart. We now offer e-Visits for anyone 22 and older to request care online for non-urgent symptoms. For details visit [email or web address].   Also download the MyChart app! Go to the app store, search "MyChart", open the app, select [facility], and log in with your MyChart username and password.

## 2022-10-28 ENCOUNTER — Other Ambulatory Visit: Payer: Self-pay

## 2022-10-28 DIAGNOSIS — C3431 Malignant neoplasm of lower lobe, right bronchus or lung: Secondary | ICD-10-CM

## 2022-11-02 ENCOUNTER — Encounter (HOSPITAL_COMMUNITY): Payer: Self-pay | Admitting: Radiology

## 2022-11-02 ENCOUNTER — Ambulatory Visit (HOSPITAL_COMMUNITY)
Admission: RE | Admit: 2022-11-02 | Discharge: 2022-11-02 | Disposition: A | Discharge status: Home / Self Care | Payer: PPO | Source: Ambulatory Visit | Attending: Hematology | Admitting: Hematology

## 2022-11-02 ENCOUNTER — Inpatient Hospital Stay: Payer: PPO | Attending: Hematology

## 2022-11-02 VITALS — BP 140/86 | HR 97 | Temp 97.3°F | Resp 20

## 2022-11-02 DIAGNOSIS — E876 Hypokalemia: Secondary | ICD-10-CM | POA: Diagnosis not present

## 2022-11-02 DIAGNOSIS — Z5112 Encounter for antineoplastic immunotherapy: Secondary | ICD-10-CM | POA: Diagnosis present

## 2022-11-02 DIAGNOSIS — Z803 Family history of malignant neoplasm of breast: Secondary | ICD-10-CM | POA: Diagnosis not present

## 2022-11-02 DIAGNOSIS — Z8541 Personal history of malignant neoplasm of cervix uteri: Secondary | ICD-10-CM | POA: Insufficient documentation

## 2022-11-02 DIAGNOSIS — Z79620 Long term (current) use of immunosuppressive biologic: Secondary | ICD-10-CM | POA: Diagnosis not present

## 2022-11-02 DIAGNOSIS — F17210 Nicotine dependence, cigarettes, uncomplicated: Secondary | ICD-10-CM | POA: Diagnosis not present

## 2022-11-02 DIAGNOSIS — Z902 Acquired absence of lung [part of]: Secondary | ICD-10-CM | POA: Diagnosis not present

## 2022-11-02 DIAGNOSIS — Z8349 Family history of other endocrine, nutritional and metabolic diseases: Secondary | ICD-10-CM | POA: Diagnosis not present

## 2022-11-02 DIAGNOSIS — C3431 Malignant neoplasm of lower lobe, right bronchus or lung: Secondary | ICD-10-CM | POA: Insufficient documentation

## 2022-11-02 DIAGNOSIS — Z923 Personal history of irradiation: Secondary | ICD-10-CM | POA: Diagnosis not present

## 2022-11-02 DIAGNOSIS — Z90710 Acquired absence of both cervix and uterus: Secondary | ICD-10-CM | POA: Insufficient documentation

## 2022-11-02 DIAGNOSIS — Z95828 Presence of other vascular implants and grafts: Secondary | ICD-10-CM

## 2022-11-02 LAB — CBC WITH DIFFERENTIAL/PLATELET
Abs Immature Granulocytes: 0.02 10*3/uL (ref 0.00–0.07)
Basophils Absolute: 0.1 10*3/uL (ref 0.0–0.1)
Basophils Relative: 1 %
Eosinophils Absolute: 0.5 10*3/uL (ref 0.0–0.5)
Eosinophils Relative: 5 %
HCT: 32.2 % — ABNORMAL LOW (ref 36.0–46.0)
Hemoglobin: 10.6 g/dL — ABNORMAL LOW (ref 12.0–15.0)
Immature Granulocytes: 0 %
Lymphocytes Relative: 23 %
Lymphs Abs: 2.4 10*3/uL (ref 0.7–4.0)
MCH: 32.5 pg (ref 26.0–34.0)
MCHC: 32.9 g/dL (ref 30.0–36.0)
MCV: 98.8 fL (ref 80.0–100.0)
Monocytes Absolute: 0.6 10*3/uL (ref 0.1–1.0)
Monocytes Relative: 6 %
Neutro Abs: 6.6 10*3/uL (ref 1.7–7.7)
Neutrophils Relative %: 65 %
Platelets: 249 10*3/uL (ref 150–400)
RBC: 3.26 MIL/uL — ABNORMAL LOW (ref 3.87–5.11)
RDW: 16.8 % — ABNORMAL HIGH (ref 11.5–15.5)
WBC: 10.1 10*3/uL (ref 4.0–10.5)
nRBC: 0 % (ref 0.0–0.2)

## 2022-11-02 LAB — COMPREHENSIVE METABOLIC PANEL
ALT: 13 U/L (ref 0–44)
AST: 13 U/L — ABNORMAL LOW (ref 15–41)
Albumin: 3.4 g/dL — ABNORMAL LOW (ref 3.5–5.0)
Alkaline Phosphatase: 83 U/L (ref 38–126)
Anion gap: 8 (ref 5–15)
BUN: 11 mg/dL (ref 8–23)
CO2: 21 mmol/L — ABNORMAL LOW (ref 22–32)
Calcium: 8.6 mg/dL — ABNORMAL LOW (ref 8.9–10.3)
Chloride: 105 mmol/L (ref 98–111)
Creatinine, Ser: 0.89 mg/dL (ref 0.44–1.00)
GFR, Estimated: >60 mL/min (ref >60)
Glucose, Bld: 90 mg/dL (ref 70–99)
Potassium: 4 mmol/L (ref 3.5–5.1)
Sodium: 134 mmol/L — ABNORMAL LOW (ref 135–145)
Total Bilirubin: 0.4 mg/dL (ref 0.3–1.2)
Total Protein: 6.4 g/dL — ABNORMAL LOW (ref 6.5–8.1)

## 2022-11-02 LAB — MAGNESIUM: Magnesium: 2 mg/dL (ref 1.7–2.4)

## 2022-11-02 LAB — TSH: TSH: 0.51 u[IU]/mL (ref 0.350–4.500)

## 2022-11-02 MED ORDER — HEPARIN SOD (PORK) LOCK FLUSH 100 UNIT/ML IV SOLN
INTRAVENOUS | Status: AC
  Filled 2022-11-02: qty 5

## 2022-11-02 MED ORDER — SODIUM CHLORIDE 0.9% FLUSH
10.0000 mL | INTRAVENOUS | Status: DC | PRN
  Administered 2022-11-02: 10 mL via INTRAVENOUS

## 2022-11-02 MED ORDER — IOHEXOL 300 MG/ML  SOLN
75.0000 mL | Freq: Once | INTRAMUSCULAR | Status: AC | PRN
  Administered 2022-11-02: 75 mL via INTRAVENOUS

## 2022-11-02 NOTE — Progress Notes (Signed)
Patients port flushed without difficulty.  Good blood return noted with no bruising or swelling noted at site.  Patient remains accessed for CT scan.  °

## 2022-11-03 ENCOUNTER — Other Ambulatory Visit (HOSPITAL_COMMUNITY): Payer: Self-pay | Admitting: Hematology

## 2022-11-03 ENCOUNTER — Encounter (HOSPITAL_COMMUNITY): Payer: Self-pay | Admitting: Hematology

## 2022-11-03 DIAGNOSIS — C3431 Malignant neoplasm of lower lobe, right bronchus or lung: Secondary | ICD-10-CM

## 2022-11-03 NOTE — Progress Notes (Signed)
START ON PATHWAY REGIMEN - Non-Small Cell Lung     A cycle is every 21 days:     Atezolizumab   **Always confirm dose/schedule in your pharmacy ordering system**  Patient Characteristics: Postoperative without Neoadjuvant Therapy (Pathologic Staging), Stage IB (Tumor Size = 4 cm) or Stage II / III, Adjuvant Chemotherapy Completed or Declined, EGFR Negative/Unknown and ALK Negative/Unknown, Stage II/III, Adjuvant Chemotherapy Completed,  PD-L1 Expression ? 1% (TC) Therapeutic Status: Postoperative without Neoadjuvant Therapy (Pathologic Staging) AJCC T Category: pT2b AJCC N Category: pN0 AJCC M Category: cM0 AJCC 8 Stage Grouping: IIA Adjuvant Chemotherapy Status: Adjuvant Chemotherapy Completed ALK Fusion/Rearrangement Status: Negative EGFR Mutation Status: Negative/Wild Type PD-L1 Expression Status: PD-L1 Expression ? 1% (TC) Intent of Therapy: Curative Intent, Discussed with Patient

## 2022-11-03 NOTE — Progress Notes (Signed)
DISCONTINUE ON PATHWAY REGIMEN - Non-Small Cell Lung     A cycle is every 21 days:     Paclitaxel      Carboplatin   **Always confirm dose/schedule in your pharmacy ordering system**  REASON: Other Reason PRIOR TREATMENT: [id number]: Carboplatin AUC=6 + Paclitaxel 200 mg/m2 q21 Days x 4 Cycles TREATMENT RESPONSE: N/A - Adjuvant Therapy    Patient Characteristics: Postoperative without Neoadjuvant Therapy (Pathologic Staging), Stage IB (Tumor Size = 4 cm) or Stage II / III, Adjuvant Chemotherapy Completed or Declined, EGFR Negative/Unknown and ALK Negative/Unknown, Stage II/III, Adjuvant Chemotherapy Completed Therapeutic Status: Postoperative without Neoadjuvant Therapy (Pathologic Staging) AJCC T Category: pT2b AJCC N Category: pN0 AJCC M Category: cM0 AJCC 8 Stage Grouping: IIA Adjuvant Chemotherapy Status: Adjuvant Chemotherapy Completed ALK Fusion/Rearrangement Status: Negative EGFR Mutation Status: Negative/Wild Type

## 2022-11-03 NOTE — Progress Notes (Signed)
ON PATHWAY REGIMEN - Non-Small Cell Lung  No Change  Continue With Treatment as Ordered.  Original Decision Date/Time: 07/12/2022 17:18     A cycle is every 21 days:     Paclitaxel      Carboplatin   **Always confirm dose/schedule in your pharmacy ordering system**  Patient Characteristics: Postoperative without Neoadjuvant Therapy (Pathologic Staging), Stage II-III, Adjuvant Chemotherapy, Stage IIA, Squamous Cell Therapeutic Status: Postoperative without Neoadjuvant Therapy (Pathologic Staging) AJCC T Category: pT2b AJCC N Category: pN0 AJCC M Category: cM0 AJCC 8 Stage Grouping: IIA Adjuvant Chemotherapy Status: Adjuvant Chemotherapy ALK Rearrangement Status: Awaiting Test Results EGFR Mutation Status: Awaiting Test Results Histology: Squamous Cell Intent of Therapy: Curative Intent, Discussed with Patient

## 2022-11-09 ENCOUNTER — Ambulatory Visit (INDEPENDENT_AMBULATORY_CARE_PROVIDER_SITE_OTHER): Payer: PPO | Admitting: Thoracic Surgery (Cardiothoracic Vascular Surgery)

## 2022-11-09 ENCOUNTER — Inpatient Hospital Stay (HOSPITAL_BASED_OUTPATIENT_CLINIC_OR_DEPARTMENT_OTHER): Payer: PPO | Admitting: Hematology

## 2022-11-09 VITALS — BP 154/97 | HR 96 | Temp 98.7°F | Resp 18

## 2022-11-09 VITALS — BP 155/90 | HR 91 | Resp 18 | Ht 65.0 in | Wt 118.0 lb

## 2022-11-09 DIAGNOSIS — Z5112 Encounter for antineoplastic immunotherapy: Secondary | ICD-10-CM | POA: Diagnosis not present

## 2022-11-09 DIAGNOSIS — Z902 Acquired absence of lung [part of]: Secondary | ICD-10-CM | POA: Diagnosis not present

## 2022-11-09 DIAGNOSIS — C3491 Malignant neoplasm of unspecified part of right bronchus or lung: Secondary | ICD-10-CM

## 2022-11-09 DIAGNOSIS — C3431 Malignant neoplasm of lower lobe, right bronchus or lung: Secondary | ICD-10-CM | POA: Diagnosis not present

## 2022-11-09 NOTE — Patient Instructions (Signed)
[facility]  Discharge Instructions  You were seen and examined today by Dr. Ellin Saba.  Dr. Ellin Saba discussed your most recent lab work and CT scan which revealed that everything looks good.  Dr. Ellin Saba discussed the most common side effects of treatment. Let us know if you have any side effects.   Follow-up as scheduled.    Thank you for choosing [facility] to provide your oncology and hematology care.   To afford each patient quality time with our provider, please arrive at least 15 minutes before your scheduled appointment time. You may need to reschedule your appointment if you arrive late (10 or more minutes). Arriving late affects you and other patients whose appointments are after yours.  Also, if you miss three or more appointments without notifying the office, you may be dismissed from the clinic at the provider's discretion.    Again, thank you for choosing [facility].  Our hope is that these requests will decrease the amount of time that you wait before being seen by our physicians.   If you have a lab appointment with the Cancer Center - please note that after April 8th, all labs will be drawn in the cancer center.  You do not have to check in or register with the main entrance as you have in the past but will complete your check-in at the cancer center.            _____________________________________________________________  Should you have questions after your visit to [facility], please contact our office at [phone] and follow the prompts.  Our office hours are 8:00 a.m. to 4:30 p.m. Monday - Thursday and 8:00 a.m. to 2:30 p.m. Friday.  Please note that voicemails left after 4:00 p.m. may not be returned until the following business day.  We are closed weekends and all major holidays.  You do have access to a nurse 24-7, just call the main number to the clinic [phone] and do  not press any options, hold on the line and a nurse will answer the phone.    For prescription refill requests, have your pharmacy contact our office and allow 72 hours.    Masks are no longer required in the cancer centers. If you would like for your care team to wear a mask while they are taking care of you, please let them know. You may have one support person who is at least 67 years old accompany you for your appointments.

## 2022-11-09 NOTE — Progress Notes (Signed)
[facility] [address], [place] [address]    Clinic Day:  11/09/2022  Referring physician: Pearson Grippe, MD  Patient Care Team: Pearson Grippe, MD as PCP - General (Internal Medicine) Venita Lick, MD as Consulting Physician (Orthopedic Surgery) Rourk, Gerrit Friends, MD as Consulting Physician (Gastroenterology) Doreatha Massed, MD as Medical Oncologist (Medical Oncology) Therese Sarah, RN as Oncology Nurse Navigator (Medical Oncology)   ASSESSMENT & PLAN:   Assessment: 1.  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 - 18 pound weight loss in the last 3 months due to decreased appetite - Chest x-ray on 04/17/2022: Mass in the right lower lobe. - CT chest (04/26/2022): 3.8 cm cavitary mass in the anterior right lower lobe abutting the major fissure.  4 mm subpleural nodule in the medial right upper lobe is nonspecific.  No adenopathy on noncontrast exam. - PET scan (05/06/2022): 3.8 x 2.3 cm hypermetabolic right lower lobe mass SUV 17.8.  Mildly metabolic asymmetric right hilar nodal tissue with SUV 4.2.  Prominent precarinal lymph node 9 mm with SUV 2.2. - Brain MRI (05/19/2022): No evidence of intracranial metastatic disease. - Bronchoscopy and biopsy by Dr. Tonia Brooms. - Pathology: Lymph node 11 R, 7 with no malignant cells.  Right lower lobe cavitary mass FNA consistent with squamous cell carcinoma, keratinizing. - 06/14/2022: Robotic assisted right lower lobectomy, lymph node dissection by Dr. Dorris Fetch. - Pathology: 4.3 x 3.9 x 2.8 cm right lower lobe squamous cell carcinoma, margins negative to.  Negative visceral pleural invasion and lymphovascular invasion.  0/18 lymph nodes involved.  Nodal stations examined include 4, 7, 8, 9, 10, 11, 12.  PT2b pN0. - 4 cycles of docetaxel and carboplatin from 07/26/2022 through 09/28/2022. - NGS: PD-L1 TPS 30%, TMB-high.  Negative for  ALK/EGFR/BRAF/KRAS/MET/RET/ROS1.  Positive for PIK3CA and T p53. - Adjuvant pembrolizumab for 1 year started on 11/10/2022   2.  Social/family history: - Lives at home with her husband [name]. - She has been on disability since she was diagnosed with cervical cancer.  Prior to that she worked at a [employer].  No chemical exposure. - She is independent of ADLs and IADLs. - Current active smoker and started smoking 1 pack/day at age 30. - Maternal aunt had breast cancer.   3.  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 - XRT 30 Gray in 5 fractions to the vaginal cuff from 02/22/2017 through 03/22/2017.    Plan: 1.  Stage IIa (T2b N0) right lower lobe squamous cell carcinoma: - She has completed 4 cycles of docetaxel and carboplatin. - Reviewed CT chest from 11/02/2022: Postresection changes with new small right pleural effusion.  Subsolid nodule within the central right middle lobe measuring 1.3 x 0.9 cm.  GG nodule within the periphery of the right upper lobe, new measuring 5 mm.  Both are nonspecific and reflect postinflammatory change.  Interval complete atelectasis of the left lower lobe with compensatory hyperinflation of the left upper lobe. - She was evaluated by Dr. Dorris Fetch this morning. - As she has a negative EGFR/ALK mutation, I have recommended pembrolizumab for a year which has shown to improve PFS.  We discussed side effects of immunotherapy in detail. - She does not have any history of autoimmune disease.  She is agreeable for treatment. - She will proceed with cycle 1 of  pembrolizumab tomorrow.  I will see her back prior to cycle 2.  If she continues to tolerate well, we may switch to every 6-week regimen.   2.  Hypokalemia: - Continue potassium 20 mEq daily.  Potassium is 4.0 today.    No orders of the defined types were placed in this encounter.     Alben Deeds  Teague,acting as a Neurosurgeon for Doreatha Massed, MD.,have documented all relevant documentation on the behalf of Doreatha Massed, MD,as directed by  Doreatha Massed, MD while in the presence of Doreatha Massed, MD.  I, Doreatha Massed MD, have reviewed the above documentation for accuracy and completeness, and I agree with the above.    Doreatha Massed, MD   7/30/20244:21 PM  CHIEF COMPLAINT:   Diagnosis: RLL lung cancer    Cancer Staging  Primary squamous cell carcinoma of lower lobe of right lung (HCC) Staging form: Lung, AJCC 8th Edition - Clinical stage from 05/23/2022: Stage IIA (cT2b, cN0, cM0) - Unsigned    Prior Therapy: RLL lobectomy and LND, 06/14/22 (Dr. Dorris Fetch)   Current Therapy:  Adjuvant chemotherapy with carboplatin and docetaxel    HISTORY OF PRESENT ILLNESS:   Oncology History  Primary squamous cell carcinoma of lower lobe of right lung (HCC)  05/23/2022 Initial Diagnosis   Primary squamous cell carcinoma of lower lobe of right lung   07/26/2022 - 09/30/2022 Chemotherapy   Patient is on Treatment Plan : LUNG Carboplatin (AUC 6) + Docetaxel (75) q21d x 4 cycles     11/10/2022 -  Chemotherapy   Patient is on Treatment Plan : LUNG NSCLC Pembrolizumab (200) q21d        INTERVAL HISTORY:   [name] is a 67 y.o. female presenting to clinic today for follow up of RLL lung cancer. She was last seen by me on 09/28/22.  He underwent a chest CT on 7/23 that found: s/p wedge resection from the right upper lobe and right lower lobectomy; new small right pleural effusion; interval development of a sub-solid nodule within the central right middle lobe measuring 1.3 x 0.9 cm; ground-glass nodule within the periphery of the right upper lobe which also appears new measuring 5 mm; interval development of complete atelectasis of the left lower lobe with compensatory hyperinflation of the left upper lobe; stable right adrenal adenoma; and bilateral dilated renal  collecting systems are identified which appears new from the PET-CT from 05/06/2022.   Today, she states that she is doing well overall. Her appetite level is at 100%. Her energy level is at 25%.  PAST MEDICAL HISTORY:   Past Medical History: Past Medical History:  Diagnosis Date   Anxiety    Arthritis    spinal stenosis   Back disorder    "crooked spine"   Bulging lumbar disc    Cervical cancer (HCC)    Complication of anesthesia    ? hypotension 10/2016 at [facility] surgery    Depression    Dyspnea    H/O degenerative disc disease    History of radiation therapy 02/22/17-03/22/17   vaginal cuff 30 Gy in 5 fractions   Hypertension    Hypothyroidism    patient taken off of hypothyroid med in 11/2016    Osteoporosis    Thyroid disease     Surgical History: Past Surgical History:  Procedure Laterality Date   ABDOMINAL EXPOSURE N/A 09/27/2018   Procedure: ABDOMINAL EXPOSURE;  Surgeon: Nada Libman, MD;  Location: [facility] OR;  Service: Vascular;  Laterality: N/A;  ABDOMINAL HYSTERECTOMY     ANTERIOR AND POSTERIOR REPAIR N/A 11/09/2016   Procedure: ANTERIOR (CYSTOCELE) AND POSTERIOR REPAIR (RECTOCELE);  Surgeon: Tilda Burrow, MD;  Location: [facility];  Service: Gynecology;  Laterality: N/A;   ANTERIOR LUMBAR FUSION Left 09/27/2018   Procedure: ANTERIOR LUMBAR FUSION L5-S1,;  Surgeon: Venita Lick, MD;  Location: [facility] OR;  Service: Orthopedics;  Laterality: Left;  4.5 HRS/ DR. Myra Gianotti ASSIST   BACK SURGERY     BILATERAL SALPINGECTOMY Left 11/09/2016   Procedure: LEFT SALPINGECTOMY;  Surgeon: Tilda Burrow, MD;  Location: [facility];  Service: Gynecology;  Laterality: Left;   BRONCHIAL BIOPSY  05/13/2022   Procedure: BRONCHIAL BIOPSIES;  Surgeon: Josephine Igo, DO;  Location: [facility];  Service: Pulmonary;;   BRONCHIAL BRUSHINGS  05/13/2022   Procedure: BRONCHIAL BRUSHINGS;  Surgeon: Josephine Igo, DO;  Location: [facility];  Service: Pulmonary;;   BRONCHIAL NEEDLE  ASPIRATION BIOPSY  05/13/2022   Procedure: BRONCHIAL NEEDLE ASPIRATION BIOPSIES;  Surgeon: Josephine Igo, DO;  Location: [facility];  Service: Pulmonary;;   CATARACT EXTRACTION W/PHACO Right 07/05/2016   Procedure: CATARACT EXTRACTION PHACO AND INTRAOCULAR LENS PLACEMENT (IOC);  Surgeon: Gemma Payor, MD;  Location: [facility];  Service: Ophthalmology;  Laterality: Right;  CDE: 15.41   CATARACT EXTRACTION W/PHACO Left 07/19/2016   Procedure: CATARACT EXTRACTION PHACO AND INTRAOCULAR LENS PLACEMENT LEFT EYE CDE= 12.65;  Surgeon: Gemma Payor, MD;  Location: [facility];  Service: Ophthalmology;  Laterality: Left;  left   COLONOSCOPY WITH PROPOFOL N/A 09/09/2021   Procedure: COLONOSCOPY WITH PROPOFOL;  Surgeon: Corbin Ade, MD;  Location: [facility];  Service: Endoscopy;  Laterality: N/A;  10:30am   EYE SURGERY     FINE NEEDLE ASPIRATION  05/13/2022   Procedure: FINE NEEDLE ASPIRATION (FNA) LINEAR;  Surgeon: Josephine Igo, DO;  Location: [facility];  Service: Pulmonary;;   INTERCOSTAL NERVE BLOCK  06/14/2022   Procedure: INTERCOSTAL NERVE BLOCK;  Surgeon: Loreli Slot, MD;  Location: [facility] OR;  Service: Thoracic;;   IR IMAGING GUIDED PORT INSERTION  07/21/2022   LUMBAR LAMINECTOMY/DECOMPRESSION MICRODISCECTOMY Left 09/27/2018   Procedure: L4-L5 Lumbar Laminectomy/Decompression;  Surgeon: Venita Lick, MD;  Location: [facility] OR;  Service: Orthopedics;  Laterality: Left;   NODE DISSECTION Right 06/14/2022   Procedure: NODE DISSECTION;  Surgeon: Loreli Slot, MD;  Location: [facility] OR;  Service: Thoracic;  Laterality: Right;   POLYPECTOMY  09/09/2021   Procedure: POLYPECTOMY;  Surgeon: Corbin Ade, MD;  [facility]: [facility];  Service: Endoscopy;;   ROBOTIC PELVIC AND PARA-AORTIC LYMPH NODE DISSECTION N/A 01/11/2017   Procedure: XI ROBOTIC BILATERAL PELVIC LYMPH NODE DISSECTION;  Surgeon: Adolphus Birchwood, MD;  Location: [facility];  Service: Gynecology;  Laterality: N/A;   SALPINGOOPHORECTOMY Right  11/09/2016   Procedure: RIGHT SALPINGO OOPHORECTOMY;  Surgeon: Tilda Burrow, MD;  Location: [facility];  Service: Gynecology;  Laterality: Right;   TUBAL LIGATION     VAGINAL HYSTERECTOMY N/A 11/09/2016   Procedure: HYSTERECTOMY VAGINAL;  Surgeon: Tilda Burrow, MD;  Location: [facility];  Service: Gynecology;  Laterality: N/A;   VIDEO BRONCHOSCOPY WITH ENDOBRONCHIAL ULTRASOUND Right 05/13/2022   Procedure: VIDEO BRONCHOSCOPY WITH ENDOBRONCHIAL ULTRASOUND;  Surgeon: Josephine Igo, DO;  Location: [facility];  Service: Pulmonary;  Laterality: Right;    Social History: Social History   Socioeconomic History   Marital status: Legally Separated    Spouse name: Not on file   Number of children: 1   Years of  education: Not on file   Highest education level: Not on file  Occupational History   Not on file  Tobacco Use   Smoking status: Every Day    Current packs/day: 1.00    Average packs/day: 1 pack/day for 44.0 years (44.0 ttl pk-yrs)    Types: Cigarettes   Smokeless tobacco: Never   Tobacco comments:    Smokes 1/2 ppd per pt. 08/10/22  Vaping Use   Vaping status: Never Used  Substance and Sexual Activity   Alcohol use: No    Comment: 01-06-2016 Per pt rarely, 02-05-2016 per pt no but 15yrs ago     Drug use: No    Comment: 02-05-2016 per pt no but about 40 yrs ago   Sexual activity: Not Currently    Birth control/protection: Surgical    Comment: hyst  Other Topics Concern   Not on file  Social History Narrative   Not on file   Social Determinants of Health   Financial Resource Strain: Unknown (01/20/2021)   Overall Financial Resource Strain (CARDIA)    Difficulty of Paying Living Expenses: Patient declined  Food Insecurity: No Food Insecurity (05/04/2022)   Hunger Vital Sign    Worried About Running Out of Food in the Last Year: Never true    Ran Out of Food in the Last Year: Never true  Transportation Needs: No Transportation Needs (05/04/2022)   PRAPARE - Therapist, art (Medical): No    Lack of Transportation (Non-Medical): No  Physical Activity: Inactive (01/20/2021)   Exercise Vital Sign    Days of Exercise per Week: 1 day    Minutes of Exercise per Session: 0 min  Stress: Stress Concern Present (01/20/2021)   [employer] of Occupational Health - Occupational Stress Questionnaire    Feeling of Stress : Very much  Social Connections: Moderately Isolated (01/20/2021)   Social Connection and Isolation Panel [NHANES]    Frequency of Communication with Friends and Family: Twice a week    Frequency of Social Gatherings with Friends and Family: Never    Attends Religious Services: More than 4 times per year    Active Member of [employer] or Organizations: No    Attends Banker Meetings: Never    Marital Status: Married  Catering manager Violence: Not At Risk (05/04/2022)   Humiliation, Afraid, Rape, and Kick questionnaire    Fear of Current or Ex-Partner: No    Emotionally Abused: No    Physically Abused: No    Sexually Abused: No    Family History: Family History  Problem Relation Age of Onset   Hypertension Mother    Congenital heart disease Mother    Diabetes Mother    Thyroid disease Brother    Other Daughter        bowel issues   Colon cancer Neg Hx    Colon polyps Neg Hx     Current Medications:  Current Outpatient Medications:    acetaminophen (TYLENOL) 325 MG tablet, Take 650 mg by mouth every 6 (six) hours as needed for headache., Disp: , Rfl:    albuterol (VENTOLIN HFA) 108 (90 Base) MCG/ACT inhaler, INHALE 2 PUFFS INTO THE LUNGS EVERY 6 HOURS AS NEEDED FOR WHEEZING OR SHORTNESS OF BREATH., Disp: 8.5 g, Rfl: 2   alendronate (FOSAMAX) 70 MG tablet, Take 70 mg by mouth once a week., Disp: , Rfl:    ALPRAZolam (XANAX) 1 MG tablet, Take 1 mg by mouth at bedtime. May take additional 1  mg during the day if needed, Disp: , Rfl:    atorvastatin (LIPITOR) 40 MG tablet, Take 40 mg by mouth at bedtime. ,  Disp: , Rfl:    benzonatate (TESSALON) 100 MG capsule, Take 100 mg by mouth 3 (three) times daily as needed for cough., Disp: , Rfl:    FLUoxetine (PROZAC) 40 MG capsule, Take 40 mg by mouth daily., Disp: , Rfl:    Fluticasone-Umeclidin-Vilant (TRELEGY ELLIPTA) 100-62.5-25 MCG/ACT AEPB, Inhale 1 each into the lungs daily., Disp: 60 each, Rfl: 2   hydrochlorothiazide (HYDRODIURIL) 25 MG tablet, Take 1 tablet (25 mg total) by mouth daily. (Patient not taking: Reported on 11/09/2022), Disp: 90 tablet, Rfl: 3   lidocaine-prilocaine (EMLA) cream, Apply a quarter-sized amount to port a cath site and cover with plastic wrap one hour prior to infusion appointments, Disp: 30 g, Rfl: 3   lisinopril (ZESTRIL) 10 MG tablet, Take 10 mg by mouth daily as needed (blood pressure of 170/99). (Patient not taking: Reported on 11/09/2022), Disp: , Rfl:    potassium chloride SA (KLOR-CON M) 20 MEQ tablet, Take 1 tablet (20 mEq total) by mouth daily., Disp: 30 tablet, Rfl: 3   traZODone (DESYREL) 100 MG tablet, Take 100 mg by mouth at bedtime., Disp: , Rfl:    Allergies: Allergies  Allergen Reactions   Zithromax [Azithromycin] Rash and Other (See Comments)    Blisters in mouth    Prednisone Nausea Only    Sick to stomach Can take in low dose    REVIEW OF SYSTEMS:   Review of Systems  Constitutional:  Positive for fatigue. Negative for chills and fever.  HENT:   Negative for lump/mass, mouth sores, nosebleeds, sore throat and trouble swallowing.   Eyes:  Negative for eye problems.  Respiratory:  Positive for cough. Negative for shortness of breath.   Cardiovascular:  Positive for palpitations. Negative for chest pain and leg swelling.  Gastrointestinal:  Negative for abdominal pain, constipation, diarrhea, nausea and vomiting.  Genitourinary:  Negative for bladder incontinence, difficulty urinating, dysuria, frequency, hematuria and nocturia.   Musculoskeletal:  Negative for arthralgias, back pain, flank pain,  myalgias and neck pain.  Skin:  Negative for itching and rash.  Neurological:  Negative for dizziness, headaches and numbness.  Hematological:  Bruises/bleeds easily.  Psychiatric/Behavioral:  Positive for depression and sleep disturbance. Negative for suicidal ideas. The patient is not nervous/anxious.   All other systems reviewed and are negative.    VITALS:   Blood pressure (!) 154/97, pulse 96, temperature 98.7 F (37.1 C), temperature source Tympanic, resp. rate 18, SpO2 93%.  Wt Readings from Last 3 Encounters:  11/09/22 118 lb (53.5 kg)  09/28/22 118 lb 12.8 oz (53.9 kg)  09/07/22 117 lb 9.6 oz (53.3 kg)    There is no height or weight on file to calculate BMI.  Performance status (ECOG): 1 - Symptomatic but completely ambulatory  PHYSICAL EXAM:   Physical Exam Vitals and nursing note reviewed. Exam conducted with a chaperone present.  Constitutional:      Appearance: Normal appearance.  Cardiovascular:     Rate and Rhythm: Normal rate and regular rhythm.     Pulses: Normal pulses.     Heart sounds: Normal heart sounds.  Pulmonary:     Effort: Pulmonary effort is normal.     Breath sounds: Normal breath sounds.  Abdominal:     Palpations: Abdomen is soft. There is no hepatomegaly, splenomegaly or mass.     Tenderness: There is no  abdominal tenderness.  Musculoskeletal:     Right lower leg: No edema.     Left lower leg: No edema.  Lymphadenopathy:     Cervical: No cervical adenopathy.     Right cervical: No superficial, deep or posterior cervical adenopathy.    Left cervical: No superficial, deep or posterior cervical adenopathy.     Upper Body:     Right upper body: No supraclavicular or axillary adenopathy.     Left upper body: No supraclavicular or axillary adenopathy.  Neurological:     General: No focal deficit present.     Mental Status: She is alert and oriented to person, place, and time.  Psychiatric:        Mood and Affect: Mood normal.         Behavior: Behavior normal.     LABS:      Latest Ref Rng & Units 11/02/2022    3:04 PM 09/28/2022   10:23 AM 09/07/2022    9:01 AM  CBC  WBC 4.0 - 10.5 K/uL 10.1  8.1  9.0   Hemoglobin 12.0 - 15.0 g/dL 40.9  81.1  91.4   Hematocrit 36.0 - 46.0 % 32.2  33.4  34.1   Platelets 150 - 400 K/uL 249  182  199       Latest Ref Rng & Units 11/02/2022    3:04 PM 09/28/2022   10:23 AM 09/07/2022    9:01 AM  CMP  Glucose 70 - 99 mg/dL 90  782  956   BUN 8 - 23 mg/dL 11  12  16    Creatinine 0.44 - 1.00 mg/dL 2.13  0.86  5.78   Sodium 135 - 145 mmol/L 134  134  135   Potassium 3.5 - 5.1 mmol/L 4.0  3.8  4.0   Chloride 98 - 111 mmol/L 105  102  102   CO2 22 - 32 mmol/L 21  23  20    Calcium 8.9 - 10.3 mg/dL 8.6  8.9  9.0   Total Protein 6.5 - 8.1 g/dL 6.4  6.5  6.7   Total Bilirubin 0.3 - 1.2 mg/dL 0.4  0.7  0.4   Alkaline Phos 38 - 126 U/L 83  73  77   AST 15 - 41 U/L 13  15  16    ALT 0 - 44 U/L 13  13  16       No results found for: "CEA1", "CEA" / No results found for: "CEA1", "CEA" No results found for: "PSA1" No results found for: "ION629" No results found for: "CAN125"  No results found for: "TOTALPROTELP", "ALBUMINELP", "A1GS", "A2GS", "BETS", "BETA2SER", "GAMS", "MSPIKE", "SPEI" No results found for: "TIBC", "FERRITIN", "IRONPCTSAT" No results found for: "LDH"   STUDIES:   CT Chest W Contrast  Result Date: 11/09/2022 CLINICAL DATA:  Non-small cell lung cancer. Restaging. * Tracking Code: BO * EXAM: CT CHEST WITH CONTRAST TECHNIQUE: Multidetector CT imaging of the chest was performed during intravenous contrast administration. RADIATION DOSE REDUCTION: This exam was performed according to the departmental dose-optimization program which includes automated exposure control, adjustment of the mA and/or kV according to patient size and/or use of iterative reconstruction technique. CONTRAST:  75mL OMNIPAQUE IOHEXOL 300 MG/ML  SOLN COMPARISON:  PET-CT from 05/06/2022 . FINDINGS:  Cardiovascular: normal heart size. No pericardial effusion. Aortic atherosclerosis and coronary artery calcifications. Mediastinum/Nodes: Thyroid gland, trachea, and esophagus are unremarkable. No mediastinal or hilar adenopathy identified. AP window lymph node is prominent measuring 1.2 cm unchanged from  previous exam, image 55/2. Lungs/Pleura: Moderate emphysema. Postoperative changes from left lower lobectomy. Wedge resection has been performed within the right upper lobe. Small right pleural effusion identified. New from previous exam. Scattered areas of interstitial reticulation identified within the right lung. -Within the central peribronchovascular right middle lobe there is a new sub-solid nodule measuring 1.3 x 0.9 cm, image 88/4. -Small ground-glass nodule within the periphery of the right upper lobe measures 5 mm, image 81/4. Also new from previous exam. Interval complete atelectasis of the left lower lobe with compensatory hyperinflation of the left upper lobe. No central airway lesion identified to account for this finding. No suspicious lung nodules identified within the left lung. Upper Abdomen: No acute abnormality. Previous right adrenal adenoma is unchanged measuring 1.5 cm. No follow-up imaging recommended. Bilateral dilated renal collecting systems are identified which appears new from the PET-CT from 05/06/2022. This is only partially visualized and etiology is indeterminate. Aortic atherosclerotic calcifications. Musculoskeletal: Multiple remote, healed right lateral fracture deformities are identified. No acute or suspicious osseous findings. IMPRESSION: 1. Status post wedge resection from the right upper lobe and right lower lobectomy. 2. New small right pleural effusion. 3. Interval development of a sub-solid nodule within the central right middle lobe measuring 1.3 x 0.9 cm. Additionally there is a ground-glass nodule within the periphery of the right upper lobe which also appears new  measuring 5 mm. These are both nonspecific but are favored to reflect postinflammatory change. Attention on follow-up imaging advised. 4. Interval development of complete atelectasis of the left lower lobe with compensatory hyperinflation of the left upper lobe. No central airway lesion identified to account for this finding. 5. Stable right adrenal adenoma. 6. Bilateral dilated renal collecting systems are identified which appears new from the PET-CT from 05/06/2022. This is only partially visualized and etiology is indeterminate. Consider further evaluation with CT of the abdomen and pelvis. 7. Aortic Atherosclerosis (ICD10-I70.0) and Emphysema (ICD10-J43.9). Electronically Signed   By: Signa Kell M.D.   On: 11/09/2022 08:47

## 2022-11-09 NOTE — Progress Notes (Signed)
[address]       [name] [address]             [phone]      HPI: [name] returns for scheduled follow-up after previous right lower lobectomy.  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  She underwent a robotic assisted right lower lobectomy on 06/14/2022.  She went home on day 3 after an uncomplicated postoperative course.  Pathologic stage was 2A (T2b, N0).  She underwent adjuvant chemotherapy with carboplatin and docetaxel.  She tolerated that well overall.  She did have some hair loss and weight loss.  She feels well.  She is not having any issues with postsurgical pain.  She does have some wheezing and does use an inhaler.  Past Medical History:  Diagnosis Date   Anxiety    Arthritis    spinal stenosis   Back disorder    "crooked spine"   Bulging lumbar disc    Cervical cancer (HCC)    Complication of anesthesia    ? hypotension 10/2016 at [facility] surgery    Depression    Dyspnea    H/O degenerative disc disease    History of radiation therapy 02/22/17-03/22/17   vaginal cuff 30 Gy in 5 fractions   Hypertension    Hypothyroidism    patient taken off of hypothyroid med in 11/2016    Osteoporosis    Thyroid disease     Current Outpatient Medications  Medication Sig Dispense Refill   acetaminophen (TYLENOL) 325 MG tablet Take 650 mg by mouth every 6 (six) hours as needed for headache.     albuterol (VENTOLIN HFA) 108 (90 Base) MCG/ACT inhaler INHALE 2 PUFFS INTO THE LUNGS EVERY 6 HOURS AS NEEDED FOR WHEEZING OR SHORTNESS OF BREATH. 8.5 g 2   alendronate (FOSAMAX) 70 MG tablet Take 70 mg by mouth once a week.     ALPRAZolam (XANAX) 1 MG tablet Take 1 mg by mouth at bedtime. May take additional 1 mg during the day if needed     atorvastatin (LIPITOR) 40 MG  tablet Take 40 mg by mouth at bedtime.      benzonatate (TESSALON) 100 MG capsule Take 100 mg by mouth 3 (three) times daily as needed for cough.     FLUoxetine (PROZAC) 40 MG capsule Take 40 mg by mouth daily.     Fluticasone-Umeclidin-Vilant (TRELEGY ELLIPTA) 100-62.5-25 MCG/ACT AEPB Inhale 1 each into the lungs daily. 60 each 2   lidocaine-prilocaine (EMLA) cream Apply a quarter-sized amount to port a cath site and cover with plastic wrap one hour prior to infusion appointments 30 g 3   potassium chloride SA (KLOR-CON M) 20 MEQ tablet Take 1 tablet (20 mEq total) by mouth daily. 30 tablet 3   traZODone (DESYREL) 100 MG tablet Take 100 mg by mouth at bedtime.     hydrochlorothiazide (HYDRODIURIL) 25 MG tablet Take 1 tablet (25 mg total) by mouth daily. (Patient not taking: Reported on 11/09/2022) 90 tablet 3   lisinopril (ZESTRIL) 10 MG tablet Take 10 mg by mouth daily as needed (blood pressure of 170/99). (Patient not taking: Reported on 11/09/2022)     No current facility-administered medications for this visit.    Physical Exam BP (!) 155/90 (BP Location: Left Arm, Patient Position: Sitting)   Pulse 91  Resp 18   Ht 5\' 5"  (1.651 m)   Wt 118 lb (53.5 kg)   SpO2 92% Comment: RA  BMI 19.64 kg/m  67 year old woman in no acute distress Alert and oriented x 3 with no focal deficits Alopecia Lungs with wheezing bilaterally Cardiac regular rate and rhythm Incisions well-healed No cervical or supraclavicular adenopathy  Diagnostic Tests: CT CHEST WITH CONTRAST   TECHNIQUE: Multidetector CT imaging of the chest was performed during intravenous contrast administration.   RADIATION DOSE REDUCTION: This exam was performed according to the departmental dose-optimization program which includes automated exposure control, adjustment of the mA and/or kV according to patient size and/or use of iterative reconstruction technique.   CONTRAST:  75mL OMNIPAQUE IOHEXOL 300 MG/ML  SOLN    COMPARISON:  PET-CT from 05/06/2022 .   FINDINGS: Cardiovascular: normal heart size. No pericardial effusion. Aortic atherosclerosis and coronary artery calcifications.   Mediastinum/Nodes: Thyroid gland, trachea, and esophagus are unremarkable. No mediastinal or hilar adenopathy identified. AP window lymph node is prominent measuring 1.2 cm unchanged from previous exam, image 55/2.   Lungs/Pleura: Moderate emphysema. Postoperative changes from left lower lobectomy. Wedge resection has been performed within the right upper lobe. Small right pleural effusion identified. New from previous exam.   Scattered areas of interstitial reticulation identified within the right lung.   -Within the central peribronchovascular right middle lobe there is a new sub-solid nodule measuring 1.3 x 0.9 cm, image 88/4.   -Small ground-glass nodule within the periphery of the right upper lobe measures 5 mm, image 81/4. Also new from previous exam.   Interval complete atelectasis of the left lower lobe with compensatory hyperinflation of the left upper lobe. No central airway lesion identified to account for this finding. No suspicious lung nodules identified within the left lung.   Upper Abdomen: No acute abnormality. Previous right adrenal adenoma is unchanged measuring 1.5 cm. No follow-up imaging recommended. Bilateral dilated renal collecting systems are identified which appears new from the PET-CT from 05/06/2022. This is only partially visualized and etiology is indeterminate. Aortic atherosclerotic calcifications.   Musculoskeletal: Multiple remote, healed right lateral fracture deformities are identified. No acute or suspicious osseous findings.   IMPRESSION: 1. Status post wedge resection from the right upper lobe and right lower lobectomy. 2. New small right pleural effusion. 3. Interval development of a sub-solid nodule within the central right middle lobe measuring 1.3 x 0.9 cm.  Additionally there is a ground-glass nodule within the periphery of the right upper lobe which also appears new measuring 5 mm. These are both nonspecific but are favored to reflect postinflammatory change. Attention on follow-up imaging advised. 4. Interval development of complete atelectasis of the left lower lobe with compensatory hyperinflation of the left upper lobe. No central airway lesion identified to account for this finding. 5. Stable right adrenal adenoma. 6. Bilateral dilated renal collecting systems are identified which appears new from the PET-CT from 05/06/2022. This is only partially visualized and etiology is indeterminate. Consider further evaluation with CT of the abdomen and pelvis. 7. Aortic Atherosclerosis (ICD10-I70.0) and Emphysema (ICD10-J43.9).     Electronically Signed   By: Signa Kell M.D.   On: 11/09/2022 08:47 I personally reviewed the CT images.  Postoperative changes are present.  Small right effusion.  Subsolid nodule in right middle lobe.  Left lower lobe atelectasis.  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  Stage IIa squamous cell carcinoma of the lung-status post right lower lobectomy followed by adjuvant chemotherapy.  Plan is for a year of immunotherapy.  She has a follow-up appointment with Dr. Ellin Saba later today.  From surgical standpoint she is doing well.  No residual pain issues.  CT did show subsolid nodule in the central right middle lobe.  Likely infectious inflammatory in nature.  Would be highly unusual for subsolid lung cancer to grow to that degree in a short period of time.  She does have significant wheezing on exam.  Encouraged her to use inhalers as prescribed.  Plan: Follow-up with Dr. Ellin Saba as scheduled I will be happy to  see Mrs. Reggio back if I can be of any assistance with her care in the future.  Loreli Slot, MD [facility] [phone]

## 2022-11-10 ENCOUNTER — Other Ambulatory Visit: Payer: Self-pay

## 2022-11-10 ENCOUNTER — Inpatient Hospital Stay: Payer: PPO

## 2022-11-10 VITALS — BP 104/66 | HR 93 | Temp 97.4°F | Resp 18

## 2022-11-10 DIAGNOSIS — Z5112 Encounter for antineoplastic immunotherapy: Secondary | ICD-10-CM | POA: Diagnosis not present

## 2022-11-10 DIAGNOSIS — C3431 Malignant neoplasm of lower lobe, right bronchus or lung: Secondary | ICD-10-CM

## 2022-11-10 MED ORDER — SODIUM CHLORIDE 0.9 % IV SOLN: Freq: Once | INTRAVENOUS | Status: AC

## 2022-11-10 MED ORDER — SODIUM CHLORIDE 0.9 % IV SOLN
200.0000 mg | Freq: Once | INTRAVENOUS | Status: AC
  Administered 2022-11-10: 200 mg via INTRAVENOUS
  Filled 2022-11-10: qty 8

## 2022-11-10 MED ORDER — HEPARIN SOD (PORK) LOCK FLUSH 100 UNIT/ML IV SOLN
500.0000 [IU] | Freq: Once | INTRAVENOUS | Status: AC | PRN
  Administered 2022-11-10: 500 [IU]

## 2022-11-10 MED ORDER — SODIUM CHLORIDE 0.9% FLUSH
10.0000 mL | INTRAVENOUS | Status: DC | PRN
  Administered 2022-11-10: 10 mL

## 2022-11-10 NOTE — Progress Notes (Signed)
Pharmacist Chemotherapy Monitoring - Initial Assessment    Anticipated start date: 11/10/22   The following has been reviewed per standard work regarding the patient's treatment regimen: The patient's diagnosis, treatment plan and drug doses, and organ/hematologic function Lab orders and baseline tests specific to treatment regimen  The treatment plan start date, drug sequencing, and pre-medications Prior authorization status  Patient's documented medication list, including drug-drug interaction screen and prescriptions for anti-emetics and supportive care specific to the treatment regimen The drug concentrations, fluid compatibility, administration routes, and timing of the medications to be used The patient's access for treatment and lifetime cumulative dose history, if applicable  The patient's medication allergies and previous infusion related reactions, if applicable   Changes made to treatment plan:  N/A  Follow up needed:  N/A   Stephens Shire, [facility], 11/10/2022  2:32 PM

## 2022-11-10 NOTE — Patient Instructions (Signed)

## 2022-11-10 NOTE — Progress Notes (Signed)
Patient presents today for chemotherapy infusion.  Patient is in satisfactory condition with no new complaints voiced.  Vital signs are stable.  Labs reviewed from 11/02/22 and all labs are within treatment parameters.  We will proceed with treatment per MD orders.

## 2022-11-10 NOTE — Progress Notes (Signed)
Patient tolerated therapy with no complaints voiced.  Side effects with management reviewed with understanding verbalized.  Port site clean and dry with no bruising or swelling noted at site.  Good blood return noted before and after administration of therapy.  Band aid applied.  Patient left in satisfactory condition with VSS and no s/s of distress noted.  

## 2022-11-11 ENCOUNTER — Telehealth: Payer: Self-pay

## 2022-11-11 NOTE — Telephone Encounter (Signed)
Chemotherapy 24 hour call back.  No answer for both numbers and unable to leave a voicemail due to mailbox full.

## 2022-11-19 ENCOUNTER — Emergency Department (HOSPITAL_COMMUNITY): Payer: PPO

## 2022-11-19 ENCOUNTER — Other Ambulatory Visit: Payer: Self-pay

## 2022-11-19 ENCOUNTER — Emergency Department (HOSPITAL_COMMUNITY)
Admission: EM | Admit: 2022-11-19 | Discharge: 2022-11-19 | Disposition: A | Discharge status: Home / Self Care | Payer: PPO | Attending: Emergency Medicine | Admitting: Emergency Medicine

## 2022-11-19 ENCOUNTER — Encounter (HOSPITAL_COMMUNITY): Payer: Self-pay | Admitting: *Deleted

## 2022-11-19 DIAGNOSIS — Z85118 Personal history of other malignant neoplasm of bronchus and lung: Secondary | ICD-10-CM

## 2022-11-19 DIAGNOSIS — R079 Chest pain, unspecified: Secondary | ICD-10-CM | POA: Diagnosis not present

## 2022-11-19 DIAGNOSIS — Z8709 Personal history of other diseases of the respiratory system: Secondary | ICD-10-CM

## 2022-11-19 DIAGNOSIS — I10 Essential (primary) hypertension: Secondary | ICD-10-CM | POA: Insufficient documentation

## 2022-11-19 DIAGNOSIS — J449 Chronic obstructive pulmonary disease, unspecified: Secondary | ICD-10-CM | POA: Diagnosis not present

## 2022-11-19 LAB — CBC WITH DIFFERENTIAL/PLATELET
Abs Immature Granulocytes: 0.04 10*3/uL (ref 0.00–0.07)
Basophils Absolute: 0.1 10*3/uL (ref 0.0–0.1)
Basophils Relative: 1 %
Eosinophils Absolute: 0.4 10*3/uL (ref 0.0–0.5)
Eosinophils Relative: 3 %
HCT: 32.9 % — ABNORMAL LOW (ref 36.0–46.0)
Hemoglobin: 10.5 g/dL — ABNORMAL LOW (ref 12.0–15.0)
Immature Granulocytes: 0 %
Lymphocytes Relative: 17 %
Lymphs Abs: 2.1 10*3/uL (ref 0.7–4.0)
MCH: 32.1 pg (ref 26.0–34.0)
MCHC: 31.9 g/dL (ref 30.0–36.0)
MCV: 100.6 fL — ABNORMAL HIGH (ref 80.0–100.0)
Monocytes Absolute: 0.9 10*3/uL (ref 0.1–1.0)
Monocytes Relative: 8 %
Neutro Abs: 8.5 10*3/uL — ABNORMAL HIGH (ref 1.7–7.7)
Neutrophils Relative %: 71 %
Platelets: 206 10*3/uL (ref 150–400)
RBC: 3.27 MIL/uL — ABNORMAL LOW (ref 3.87–5.11)
RDW: 13.3 % (ref 11.5–15.5)
WBC: 11.9 10*3/uL — ABNORMAL HIGH (ref 4.0–10.5)
nRBC: 0 % (ref 0.0–0.2)

## 2022-11-19 LAB — CULTURE, BLOOD (ROUTINE X 2): Special Requests: ADEQUATE

## 2022-11-19 LAB — PROTIME-INR
INR: 1 (ref 0.8–1.2)
Prothrombin Time: 13.7 seconds (ref 11.4–15.2)

## 2022-11-19 LAB — TROPONIN I (HIGH SENSITIVITY)
Troponin I (High Sensitivity): 3 ng/L (ref <18)
Troponin I (High Sensitivity): 3 ng/L (ref <18)

## 2022-11-19 LAB — COMPREHENSIVE METABOLIC PANEL
ALT: 19 U/L (ref 0–44)
AST: 31 U/L (ref 15–41)
Albumin: 3.2 g/dL — ABNORMAL LOW (ref 3.5–5.0)
Alkaline Phosphatase: 74 U/L (ref 38–126)
Anion gap: 9 (ref 5–15)
BUN: 18 mg/dL (ref 8–23)
CO2: 25 mmol/L (ref 22–32)
Calcium: 8.8 mg/dL — ABNORMAL LOW (ref 8.9–10.3)
Chloride: 100 mmol/L (ref 98–111)
Creatinine, Ser: 1.07 mg/dL — ABNORMAL HIGH (ref 0.44–1.00)
GFR, Estimated: 57 mL/min — ABNORMAL LOW (ref >60)
Glucose, Bld: 109 mg/dL — ABNORMAL HIGH (ref 70–99)
Potassium: 3.7 mmol/L (ref 3.5–5.1)
Sodium: 134 mmol/L — ABNORMAL LOW (ref 135–145)
Total Bilirubin: 0.6 mg/dL (ref 0.3–1.2)
Total Protein: 6.5 g/dL (ref 6.5–8.1)

## 2022-11-19 LAB — D-DIMER, QUANTITATIVE: D-Dimer, Quant: 0.86 ug/mL-FEU — ABNORMAL HIGH (ref 0.00–0.50)

## 2022-11-19 LAB — LACTIC ACID, PLASMA: Lactic Acid, Venous: 0.9 mmol/L (ref 0.5–1.9)

## 2022-11-19 MED ORDER — ASPIRIN 81 MG PO CHEW
324.0000 mg | CHEWABLE_TABLET | Freq: Once | ORAL | Status: AC
  Administered 2022-11-19: 324 mg via ORAL
  Filled 2022-11-19: qty 4

## 2022-11-19 MED ORDER — MORPHINE SULFATE (PF) 2 MG/ML IV SOLN
2.0000 mg | Freq: Once | INTRAVENOUS | Status: AC
  Administered 2022-11-19: 2 mg via INTRAVENOUS
  Filled 2022-11-19: qty 1

## 2022-11-19 MED ORDER — LIDOCAINE 5 % EX PTCH
1.0000 | MEDICATED_PATCH | CUTANEOUS | Status: DC
  Administered 2022-11-19: 1 via TRANSDERMAL
  Filled 2022-11-19: qty 1

## 2022-11-19 MED ORDER — IOHEXOL 350 MG/ML SOLN
75.0000 mL | Freq: Once | INTRAVENOUS | Status: AC | PRN
  Administered 2022-11-19: 75 mL via INTRAVENOUS

## 2022-11-19 MED ORDER — ACETAMINOPHEN 500 MG PO TABS
1000.0000 mg | ORAL_TABLET | Freq: Once | ORAL | Status: AC
  Administered 2022-11-19: 1000 mg via ORAL
  Filled 2022-11-19: qty 2

## 2022-11-19 MED ORDER — HEPARIN SOD (PORK) LOCK FLUSH 100 UNIT/ML IV SOLN
500.0000 [IU] | Freq: Once | INTRAVENOUS | Status: AC
  Administered 2022-11-19: 500 [IU]
  Filled 2022-11-19: qty 5

## 2022-11-19 NOTE — ED Provider Notes (Signed)
[place] EMERGENCY DEPARTMENT AT [facility] Provider Note   CSN: [id number] Arrival date & time: 11/19/22  1742     History {Add pertinent medical, surgical, social history, OB history to HPI:1} Chief Complaint  Patient presents with   Chest Pain    [name] is a 67 y.o. female.  67 year old female with history of lung cancer status post right lower lobectomy currently on chemotherapy, cervical cancer in remission, degenerative disc disease, COPD, hypertension, hyperlipidemia, and anxiety who presents to the emergency department with chest pain.  Reports that last night started experiencing left-sided chest pain that radiated across her shoulders and her back.  Says that it was 10 out of 10 in severity.  It remitted and then came back today and is currently 7/10 in severity.  No clear exacerbating or alleviating factors.  Reported to triage that she has had some shortness of breath at times but denies any shortness of breath or cough to me.  No runny nose or sore throat except for last week when she had a mild sore throat.  No diaphoresis or vomiting.  No personal or family history of MI.  No personal history of DVT or PE.       Home Medications Prior to Admission medications   Medication Sig Start Date End Date Taking? Authorizing Provider  acetaminophen (TYLENOL) 325 MG tablet Take 650 mg by mouth every 6 (six) hours as needed for headache.    [provider]  albuterol (VENTOLIN HFA) 108 (90 Base) MCG/ACT inhaler INHALE 2 PUFFS INTO THE LUNGS EVERY 6 HOURS AS NEEDED FOR WHEEZING OR SHORTNESS OF BREATH. 05/05/22   Icard, Rachel Bo, DO  alendronate (FOSAMAX) 70 MG tablet Take 70 mg by mouth once a week. 12/11/18   [provider]  ALPRAZolam Prudy Feeler) 1 MG tablet Take 1 mg by mouth at bedtime. May take additional 1 mg during the day if needed 09/24/19   [provider]  atorvastatin (LIPITOR) 40 MG tablet Take 40 mg by mouth at bedtime.      [provider]  benzonatate (TESSALON) 100 MG capsule Take 100 mg by mouth 3 (three) times daily as needed for cough.    [provider]  FLUoxetine (PROZAC) 40 MG capsule Take 40 mg by mouth daily. 11/10/21   [provider]  Fluticasone-Umeclidin-Vilant (TRELEGY ELLIPTA) 100-62.5-25 MCG/ACT AEPB Inhale 1 each into the lungs daily. 05/06/22   Icard, Rachel Bo, DO  hydrochlorothiazide (HYDRODIURIL) 25 MG tablet Take 1 tablet (25 mg total) by mouth daily. Patient not taking: Reported on 11/09/2022 03/15/22 03/10/23  Mallipeddi, Orion Modest, MD  lidocaine-prilocaine (EMLA) cream Apply a quarter-sized amount to port a cath site and cover with plastic wrap one hour prior to infusion appointments 07/22/22   Doreatha Massed, MD  lisinopril (ZESTRIL) 10 MG tablet Take 10 mg by mouth daily as needed (blood pressure of 170/99). Patient not taking: Reported on 11/09/2022    [provider]  potassium chloride SA (KLOR-CON M) 20 MEQ tablet Take 1 tablet (20 mEq total) by mouth daily. 08/02/22   Carnella Guadalajara, PA-C  traZODone (DESYREL) 100 MG tablet Take 100 mg by mouth at bedtime. 11/17/19   [provider]      Allergies    Zithromax [azithromycin] and Prednisone    Review of Systems   Review of Systems  Physical Exam Updated Vital Signs BP 102/71 (BP Location: Right Arm)   Pulse 90   Temp 99.5 F (37.5  C) (Oral)   Resp 20   Ht 5\' 6"  (1.676 m)   Wt 53.5 kg   SpO2 90%   BMI 19.05 kg/m  Physical Exam Vitals and nursing note reviewed.  Constitutional:      General: She is not in acute distress.    Appearance: She is well-developed.     Comments: On 2 L nasal cannula  HENT:     Head: Normocephalic and atraumatic.     Right Ear: External ear normal.     Left Ear: External ear normal.     Nose: Nose normal.  Eyes:     Extraocular Movements: Extraocular movements intact.     Conjunctiva/sclera: Conjunctivae normal.     Pupils: Pupils are  equal, round, and reactive to light.  Cardiovascular:     Rate and Rhythm: Normal rate and regular rhythm.     Heart sounds: No murmur heard.    Comments: Chest pain reproducible.  Port in right chest wall. Pulmonary:     Effort: Pulmonary effort is normal. No respiratory distress.     Breath sounds: Rhonchi (Expiratory) present.  Abdominal:     General: Abdomen is flat. There is no distension.     Palpations: Abdomen is soft. There is no mass.     Tenderness: There is no abdominal tenderness. There is no guarding.  Musculoskeletal:     Cervical back: Normal range of motion and neck supple.     Right lower leg: No edema.     Left lower leg: No edema.  Skin:    General: Skin is warm and dry.  Neurological:     Mental Status: She is alert and oriented to person, place, and time. Mental status is at baseline.  Psychiatric:        Mood and Affect: Mood normal.     ED Results / Procedures / Treatments   Labs (all labs ordered are listed, but only abnormal results are displayed) Labs Reviewed  CBC WITH DIFFERENTIAL/PLATELET - Abnormal; Notable for the following components:      Result Value   WBC 11.9 (*)    RBC 3.27 (*)    Hemoglobin 10.5 (*)    HCT 32.9 (*)    MCV 100.6 (*)    Neutro Abs 8.5 (*)    All other components within normal limits  CULTURE, BLOOD (ROUTINE X 2)  CULTURE, BLOOD (ROUTINE X 2)  COMPREHENSIVE METABOLIC PANEL  LACTIC ACID, PLASMA  LACTIC ACID, PLASMA  PROTIME-INR  URINALYSIS, W/ REFLEX TO CULTURE (INFECTION SUSPECTED)    EKG None  Radiology DG Chest 2 View  Result Date: 11/19/2022 CLINICAL DATA:  Suspected sepsis EXAM: CHEST - 2 VIEW COMPARISON:  Chest x-ray 08/10/2022.  CT of the chest 11/02/2022 FINDINGS: Small right pleural effusion is unchanged. No new focal lung opacity identified. Triangular shaped density overlying the right heart border is likely related to epicardial fat pad as seen on comparison CT. There is scarring in the left  costophrenic angle. There is no pneumothorax. Right chest port catheter tip ends in the SVC. Cardiomediastinal silhouette is otherwise within normal limits. No acute fractures are seen. IMPRESSION: Stable small right pleural effusion. No new focal lung opacity identified. Electronically Signed   By: Darliss Cheney M.D.   On: 11/19/2022 18:44    Procedures Procedures  {Document cardiac monitor, telemetry assessment procedure when appropriate:1}  Medications Ordered in ED Medications - No data to display  ED Course/ Medical Decision Making/ A&P   {  Click here for ABCD2, HEART and other calculatorsREFRESH Note before signing :1}                              Medical Decision Making Amount and/or Complexity of Data Reviewed Labs: ordered. Radiology: ordered.  Risk OTC drugs. Prescription drug management.   ***  {Document critical care time when appropriate:1} {Document review of labs and clinical decision tools ie heart score, Chads2Vasc2 etc:1}  {Document your independent review of radiology images, and any outside records:1} {Document your discussion with family members, caretakers, and with consultants:1} {Document social determinants of health affecting pt's care:1} {Document your decision making why or why not admission, treatments were needed:1} Final Clinical Impression(s) / ED Diagnoses Final diagnoses:  None    Rx / DC Orders ED Discharge Orders     None

## 2022-11-19 NOTE — ED Triage Notes (Signed)
Pt with mid CP since last night. SOB at times.  Last chemo last week for lung CA.

## 2022-11-19 NOTE — Discharge Instructions (Signed)
You were seen for your chest pain and back pain in the emergency department.   At home, please take Tylenol and use lidocaine patches for your pain.    Follow-up with your primary doctor in 2-3 days regarding your visit.  Cardiology will be calling you regarding an appointment within the next 72 hours.  You may contact them if you do not hear from them in that time using the information in this packet.  Return immediately to the emergency department if you experience any of the following: Worsening pain, difficulty breathing, unexplained vomiting or sweating, or any other concerning symptoms.    Thank you for visiting our Emergency Department. It was a pleasure taking care of you today.

## 2022-11-19 NOTE — ED Notes (Signed)
Patient transported to CT 

## 2022-11-22 ENCOUNTER — Encounter: Payer: Self-pay | Admitting: *Deleted

## 2022-11-22 NOTE — Progress Notes (Signed)
Patient called clinic today.  She had Keytruda on 7/31. She has a "red dotted type rash" on her legs that started this past week.  She also had an episode of left shoulder and chest pain that took her to the ER where they ruled out MI but her troponins and d-dimer were slightly elevated.   I spoke with Dr. Ellin Saba about the above and he reviewed patients chart and does not believe that the chest pain was caused by the [facility].  He does want her to send [place] pictures of her legs to rule out immunotherapy related rash.    I returned patients call and advised her to send pictures via mychart or come to the clinic to let Dr. Ellin Saba look at her legs.  She verbalizes understanding and will do so.

## 2022-11-23 ENCOUNTER — Telehealth: Payer: Self-pay | Admitting: *Deleted

## 2022-11-23 ENCOUNTER — Inpatient Hospital Stay: Payer: PPO | Attending: Hematology | Admitting: Hematology

## 2022-11-23 DIAGNOSIS — Z923 Personal history of irradiation: Secondary | ICD-10-CM | POA: Insufficient documentation

## 2022-11-23 DIAGNOSIS — Z902 Acquired absence of lung [part of]: Secondary | ICD-10-CM | POA: Insufficient documentation

## 2022-11-23 DIAGNOSIS — E876 Hypokalemia: Secondary | ICD-10-CM | POA: Insufficient documentation

## 2022-11-23 DIAGNOSIS — Z9221 Personal history of antineoplastic chemotherapy: Secondary | ICD-10-CM | POA: Diagnosis not present

## 2022-11-23 DIAGNOSIS — F17210 Nicotine dependence, cigarettes, uncomplicated: Secondary | ICD-10-CM | POA: Diagnosis not present

## 2022-11-23 DIAGNOSIS — Z09 Encounter for follow-up examination after completed treatment for conditions other than malignant neoplasm: Secondary | ICD-10-CM | POA: Diagnosis not present

## 2022-11-23 DIAGNOSIS — C3431 Malignant neoplasm of lower lobe, right bronchus or lung: Secondary | ICD-10-CM

## 2022-11-23 DIAGNOSIS — R7989 Other specified abnormal findings of blood chemistry: Secondary | ICD-10-CM | POA: Diagnosis not present

## 2022-11-23 NOTE — Progress Notes (Signed)
[facility] [address], [place] [address]    Clinic Day:  11/23/2022  Referring physician: Pearson Grippe, MD  Patient Care Team: Amber Grippe, MD as PCP - General (Internal Medicine) Venita Lick, MD as Consulting Physician (Orthopedic Surgery) Rourk, Gerrit Friends, MD as Consulting Physician (Gastroenterology) Doreatha Massed, MD as Medical Oncologist (Medical Oncology) Therese Sarah, RN as Oncology Nurse Navigator (Medical Oncology)   ASSESSMENT & PLAN:   Assessment: 1.  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 - 18 pound weight loss in the last 3 months due to decreased appetite - Chest x-ray on 04/17/2022: Mass in the right lower lobe. - CT chest (04/26/2022): 3.8 cm cavitary mass in the anterior right lower lobe abutting the major fissure.  4 mm subpleural nodule in the medial right upper lobe is nonspecific.  No adenopathy on noncontrast exam. - PET scan (05/06/2022): 3.8 x 2.3 cm hypermetabolic right lower lobe mass SUV 17.8.  Mildly metabolic asymmetric right hilar nodal tissue with SUV 4.2.  Prominent precarinal lymph node 9 mm with SUV 2.2. - Brain MRI (05/19/2022): No evidence of intracranial metastatic disease. - Bronchoscopy and biopsy by Dr. Tonia Brooms. - Pathology: Lymph node 11 R, 7 with no malignant cells.  Right lower lobe cavitary mass FNA consistent with squamous cell carcinoma, keratinizing. - 06/14/2022: Robotic assisted right lower lobectomy, lymph node dissection by Dr. Dorris Fetch. - Pathology: 4.3 x 3.9 x 2.8 cm right lower lobe squamous cell carcinoma, margins negative to.  Negative visceral pleural invasion and lymphovascular invasion.  0/18 lymph nodes involved.  Nodal stations examined include 4, 7, 8, 9, 10, 11, 12.  PT2b pN0. - 4 cycles of docetaxel and carboplatin from 07/26/2022 through 09/28/2022. - NGS: PD-L1 TPS 30%, TMB-high.  Negative for  ALK/EGFR/BRAF/KRAS/MET/RET/ROS1.  Positive for PIK3CA and T p53. - Adjuvant pembrolizumab for 1 year started on 11/10/2022, discontinued after cycle 1.   2.  Social/family history: - Lives at home with her husband [name]. - She has been on disability since she was diagnosed with cervical cancer.  Prior to that she worked at a [employer].  No chemical exposure. - She is independent of ADLs and IADLs. - Current active smoker and started smoking 1 pack/day at age 43. - Maternal aunt had breast cancer.   3.  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 - XRT 30 Gray in 5 fractions to the vaginal cuff from 02/22/2017 through 03/22/2017.    Plan: 1.  Stage IIa (T2b N0) right lower lobe squamous cell carcinoma: - She has received pembrolizumab cycle 1 on 11/10/2022. - She reportedly developed left-sided shoulder pain and anterior chest wall pain for 2 days and was evaluated in the ER on 11/19/2022. - I have reviewed CT angiogram from 11/19/2022: Negative for pulmonary embolism.  Stable changes in the right lung with no new findings.  Stable right sided postsurgical changes and right pleural effusion. - I have also reviewed labs from 11/19/2022 which showed troponin was negative x 2.  LFTs were normal.  CBC without any significant changes.  D-dimer is elevated at 0.86.  Blood cultures did not show any growth. - She had three 1 mm red spots on the right leg.  Nonspecific.  No other skin rashes on the body. - We had a prolonged discussion.  Patient would like to  discontinue pembrolizumab at this time.  We will discontinue all future appointments. - I have discussed surveillance plan with the patient in detail.  We will recommend repetition of CT of the chest in 4 months.  If it continues to be stable, we will switch to every 48-month CT scans.  She will continue to keep the port at this time and have it flushed  every 3 months.   2.  Hypokalemia: - Continue potassium 20 mEq daily.  Last potassium was 3.7 on 11/19/2022.    Orders Placed This Encounter  Procedures   CT Chest W Contrast    Standing Status:   Future    Standing Expiration Date:   11/23/2023    Order Specific Question:   If indicated for the ordered procedure, I authorize the administration of contrast media per Radiology protocol    Answer:   Yes    Order Specific Question:   Does the patient have a contrast media/X-ray dye allergy?    Answer:   No    Order Specific Question:   Preferred imaging location?    Answer:   [facility]    Order Specific Question:   Release to patient    Answer:   Immediate [1]      I,Helena R Teague,acting as a scribe for Doreatha Massed, MD.,have documented all relevant documentation on the behalf of Doreatha Massed, MD,as directed by  Doreatha Massed, MD while in the presence of Doreatha Massed, MD.  I, Doreatha Massed MD, have reviewed the above documentation for accuracy and completeness, and I agree with the above.    Doreatha Massed, MD   8/13/20245:51 PM  CHIEF COMPLAINT:   Diagnosis: RLL lung cancer    Cancer Staging  Primary squamous cell carcinoma of lower lobe of right lung (HCC) Staging form: Lung, AJCC 8th Edition - Clinical stage from 05/23/2022: Stage IIA (cT2b, cN0, cM0) - Unsigned    Prior Therapy: RLL lobectomy and LND, 06/14/22 (Dr. Dorris Fetch)   Current Therapy:  Adjuvant chemotherapy with carboplatin and docetaxel    HISTORY OF PRESENT ILLNESS:   Oncology History  Primary squamous cell carcinoma of lower lobe of right lung (HCC)  05/23/2022 Initial Diagnosis   Primary squamous cell carcinoma of lower lobe of right lung   07/26/2022 - 09/30/2022 Chemotherapy   Patient is on Treatment Plan : LUNG Carboplatin (AUC 6) + Docetaxel (75) q21d x 4 cycles     11/10/2022 -  Chemotherapy   Patient is on Treatment Plan : LUNG NSCLC Pembrolizumab  (200) q21d        INTERVAL HISTORY:   [name] is a 67 y.o. female seen because of complications after last Keytruda.  She went to the ER on 11/19/2022 with 2-day history of left-sided chest wall pain.  She had CT scan and labs done.  She also reported faint rash on the leg.  No itching reported.  Energy levels are 80%.  Appetite is 80%.  PAST MEDICAL HISTORY:   Past Medical History: Past Medical History:  Diagnosis Date   Anxiety    Arthritis    spinal stenosis   Back disorder    "crooked spine"   Bulging lumbar disc    Cervical cancer (HCC)    Complication of anesthesia    ? hypotension 10/2016 at [facility] surgery    Depression    Dyspnea    H/O degenerative disc disease    History of radiation therapy 02/22/17-03/22/17   vaginal cuff 30 Gy in  5 fractions   Hypertension    Hypothyroidism    patient taken off of hypothyroid med in 11/2016    Osteoporosis    Thyroid disease     Surgical History: Past Surgical History:  Procedure Laterality Date   ABDOMINAL EXPOSURE N/A 09/27/2018   Procedure: ABDOMINAL EXPOSURE;  Surgeon: Nada Libman, MD;  Location: [facility] OR;  Service: Vascular;  Laterality: N/A;   ABDOMINAL HYSTERECTOMY     ANTERIOR AND POSTERIOR REPAIR N/A 11/09/2016   Procedure: ANTERIOR (CYSTOCELE) AND POSTERIOR REPAIR (RECTOCELE);  Surgeon: Tilda Burrow, MD;  Location: [facility] ORS;  Service: Gynecology;  Laterality: N/A;   ANTERIOR LUMBAR FUSION Left 09/27/2018   Procedure: ANTERIOR LUMBAR FUSION L5-S1,;  Surgeon: Venita Lick, MD;  Location: [facility] OR;  Service: Orthopedics;  Laterality: Left;  4.5 HRS/ DR. Myra Gianotti ASSIST   BACK SURGERY     BILATERAL SALPINGECTOMY Left 11/09/2016   Procedure: LEFT SALPINGECTOMY;  Surgeon: Tilda Burrow, MD;  Location: [facility];  Service: Gynecology;  Laterality: Left;   BRONCHIAL BIOPSY  05/13/2022   Procedure: BRONCHIAL BIOPSIES;  Surgeon: Josephine Igo, DO;  Location: [facility];  Service: Pulmonary;;   BRONCHIAL BRUSHINGS   05/13/2022   Procedure: BRONCHIAL BRUSHINGS;  Surgeon: Josephine Igo, DO;  Location: [facility];  Service: Pulmonary;;   BRONCHIAL NEEDLE ASPIRATION BIOPSY  05/13/2022   Procedure: BRONCHIAL NEEDLE ASPIRATION BIOPSIES;  Surgeon: Josephine Igo, DO;  Location: [facility];  Service: Pulmonary;;   CATARACT EXTRACTION W/PHACO Right 07/05/2016   Procedure: CATARACT EXTRACTION PHACO AND INTRAOCULAR LENS PLACEMENT (IOC);  Surgeon: Gemma Payor, MD;  Location: [facility];  Service: Ophthalmology;  Laterality: Right;  CDE: 15.41   CATARACT EXTRACTION W/PHACO Left 07/19/2016   Procedure: CATARACT EXTRACTION PHACO AND INTRAOCULAR LENS PLACEMENT LEFT EYE CDE= 12.65;  Surgeon: Gemma Payor, MD;  Location: [facility];  Service: Ophthalmology;  Laterality: Left;  left   COLONOSCOPY WITH PROPOFOL N/A 09/09/2021   Procedure: COLONOSCOPY WITH PROPOFOL;  Surgeon: Corbin Ade, MD;  Location: [facility];  Service: Endoscopy;  Laterality: N/A;  10:30am   EYE SURGERY     FINE NEEDLE ASPIRATION  05/13/2022   Procedure: FINE NEEDLE ASPIRATION (FNA) LINEAR;  Surgeon: Josephine Igo, DO;  Location: [facility];  Service: Pulmonary;;   INTERCOSTAL NERVE BLOCK  06/14/2022   Procedure: INTERCOSTAL NERVE BLOCK;  Surgeon: Loreli Slot, MD;  Location: [facility] OR;  Service: Thoracic;;   IR IMAGING GUIDED PORT INSERTION  07/21/2022   LUMBAR LAMINECTOMY/DECOMPRESSION MICRODISCECTOMY Left 09/27/2018   Procedure: L4-L5 Lumbar Laminectomy/Decompression;  Surgeon: Venita Lick, MD;  Location: [facility] OR;  Service: Orthopedics;  Laterality: Left;   NODE DISSECTION Right 06/14/2022   Procedure: NODE DISSECTION;  Surgeon: Loreli Slot, MD;  Location: [facility] OR;  Service: Thoracic;  Laterality: Right;   POLYPECTOMY  09/09/2021   Procedure: POLYPECTOMY;  Surgeon: Corbin Ade, MD;  [facility]: [facility];  Service: Endoscopy;;   ROBOTIC PELVIC AND PARA-AORTIC LYMPH NODE DISSECTION N/A 01/11/2017   Procedure: XI ROBOTIC BILATERAL  PELVIC LYMPH NODE DISSECTION;  Surgeon: Adolphus Birchwood, MD;  Location: [facility] ORS;  Service: Gynecology;  Laterality: N/A;   SALPINGOOPHORECTOMY Right 11/09/2016   Procedure: RIGHT SALPINGO OOPHORECTOMY;  Surgeon: Tilda Burrow, MD;  Location: [facility];  Service: Gynecology;  Laterality: Right;   TUBAL LIGATION     VAGINAL HYSTERECTOMY N/A 11/09/2016   Procedure: HYSTERECTOMY VAGINAL;  Surgeon: Tilda Burrow, MD;  Location: [facility];  Service: Gynecology;  Laterality: N/A;   VIDEO BRONCHOSCOPY WITH ENDOBRONCHIAL ULTRASOUND Right 05/13/2022   Procedure: VIDEO BRONCHOSCOPY WITH ENDOBRONCHIAL ULTRASOUND;  Surgeon: Josephine Igo, DO;  Location: [facility];  Service: Pulmonary;  Laterality: Right;    Social History: Social History   Socioeconomic History   Marital status: Legally Separated    Spouse name: Not on file   Number of children: 1   Years of education: Not on file   Highest education level: Not on file  Occupational History   Not on file  Tobacco Use   Smoking status: Every Day    Current packs/day: 1.00    Average packs/day: 1 pack/day for 44.0 years (44.0 ttl pk-yrs)    Types: Cigarettes   Smokeless tobacco: Never   Tobacco comments:    Smokes 1/2 ppd per pt. 08/10/22  Vaping Use   Vaping status: Never Used  Substance and Sexual Activity   Alcohol use: No    Comment: 01-06-2016 Per pt rarely, 02-05-2016 per pt no but 16yrs ago     Drug use: No    Comment: 02-05-2016 per pt no but about 40 yrs ago   Sexual activity: Not Currently    Birth control/protection: Surgical    Comment: hyst  Other Topics Concern   Not on file  Social History Narrative   Not on file   Social Determinants of Health   Financial Resource Strain: Unknown (01/20/2021)   Overall Financial Resource Strain (CARDIA)    Difficulty of Paying Living Expenses: Patient declined  Food Insecurity: No Food Insecurity (05/04/2022)   Hunger Vital Sign    Worried About Running Out of Food in the Last Year:  Never true    Ran Out of Food in the Last Year: Never true  Transportation Needs: No Transportation Needs (05/04/2022)   PRAPARE - Administrator, Civil Service (Medical): No    Lack of Transportation (Non-Medical): No  Physical Activity: Inactive (01/20/2021)   Exercise Vital Sign    Days of Exercise per Week: 1 day    Minutes of Exercise per Session: 0 min  Stress: Stress Concern Present (01/20/2021)   [employer] of Occupational Health - Occupational Stress Questionnaire    Feeling of Stress : Very much  Social Connections: Moderately Isolated (01/20/2021)   Social Connection and Isolation Panel [NHANES]    Frequency of Communication with Friends and Family: Twice a week    Frequency of Social Gatherings with Friends and Family: Never    Attends Religious Services: More than 4 times per year    Active Member of [employer] or Organizations: No    Attends Banker Meetings: Never    Marital Status: Married  Catering manager Violence: Not At Risk (05/04/2022)   Humiliation, Afraid, Rape, and Kick questionnaire    Fear of Current or Ex-Partner: No    Emotionally Abused: No    Physically Abused: No    Sexually Abused: No    Family History: Family History  Problem Relation Age of Onset   Hypertension Mother    Congenital heart disease Mother    Diabetes Mother    Thyroid disease Brother    Other Daughter        bowel issues   Colon cancer Neg Hx    Colon polyps Neg Hx     Current Medications:  Current Outpatient Medications:    acetaminophen (TYLENOL) 325 MG tablet, Take 650 mg by mouth every 6 (six) hours as needed for headache., Disp: , Rfl:  albuterol (VENTOLIN HFA) 108 (90 Base) MCG/ACT inhaler, INHALE 2 PUFFS INTO THE LUNGS EVERY 6 HOURS AS NEEDED FOR WHEEZING OR SHORTNESS OF BREATH., Disp: 8.5 g, Rfl: 2   alendronate (FOSAMAX) 70 MG tablet, Take 70 mg by mouth once a week., Disp: , Rfl:    ALPRAZolam (XANAX) 1 MG tablet, Take 1 mg by mouth at  bedtime. May take additional 1 mg during the day if needed, Disp: , Rfl:    atorvastatin (LIPITOR) 40 MG tablet, Take 40 mg by mouth at bedtime. , Disp: , Rfl:    benzonatate (TESSALON) 100 MG capsule, Take 100 mg by mouth 3 (three) times daily as needed for cough., Disp: , Rfl:    FLUoxetine (PROZAC) 40 MG capsule, Take 40 mg by mouth daily., Disp: , Rfl:    Fluticasone-Umeclidin-Vilant (TRELEGY ELLIPTA) 100-62.5-25 MCG/ACT AEPB, Inhale 1 each into the lungs daily., Disp: 60 each, Rfl: 2   hydrochlorothiazide (HYDRODIURIL) 25 MG tablet, Take 1 tablet (25 mg total) by mouth daily. (Patient not taking: Reported on 11/09/2022), Disp: 90 tablet, Rfl: 3   lidocaine-prilocaine (EMLA) cream, Apply a quarter-sized amount to port a cath site and cover with plastic wrap one hour prior to infusion appointments, Disp: 30 g, Rfl: 3   lisinopril (ZESTRIL) 10 MG tablet, Take 10 mg by mouth daily as needed (blood pressure of 170/99). (Patient not taking: Reported on 11/09/2022), Disp: , Rfl:    potassium chloride SA (KLOR-CON M) 20 MEQ tablet, Take 1 tablet (20 mEq total) by mouth daily., Disp: 30 tablet, Rfl: 3   traZODone (DESYREL) 100 MG tablet, Take 100 mg by mouth at bedtime., Disp: , Rfl:    Allergies: Allergies  Allergen Reactions   Zithromax [Azithromycin] Rash and Other (See Comments)    Blisters in mouth    Prednisone Nausea Only    Sick to stomach Can take in low dose    REVIEW OF SYSTEMS:   Review of Systems  Constitutional:  Negative for chills and fever.  HENT:   Negative for lump/mass, mouth sores, nosebleeds, sore throat and trouble swallowing.   Eyes:  Negative for eye problems.  Respiratory:  Positive for cough. Negative for shortness of breath.   Cardiovascular:  Negative for chest pain and leg swelling.  Gastrointestinal:  Negative for abdominal pain, constipation, diarrhea, nausea and vomiting.  Genitourinary:  Negative for bladder incontinence, difficulty urinating, dysuria,  frequency, hematuria and nocturia.   Musculoskeletal:  Positive for arthralgias. Negative for back pain, flank pain, myalgias and neck pain.  Skin:  Positive for rash. Negative for itching.  Neurological:  Negative for dizziness, headaches and numbness.  Hematological:  Bruises/bleeds easily.  Psychiatric/Behavioral:  Positive for depression and sleep disturbance. Negative for suicidal ideas. The patient is not nervous/anxious.   All other systems reviewed and are negative.    VITALS:   There were no vitals taken for this visit.  Wt Readings from Last 3 Encounters:  11/19/22 118 lb (53.5 kg)  11/09/22 118 lb (53.5 kg)  09/28/22 118 lb 12.8 oz (53.9 kg)    There is no height or weight on file to calculate BMI.  Performance status (ECOG): 1 - Symptomatic but completely ambulatory  PHYSICAL EXAM:   Physical Exam Vitals and nursing note reviewed. Exam conducted with a chaperone present.  Constitutional:      Appearance: Normal appearance.  Cardiovascular:     Rate and Rhythm: Normal rate and regular rhythm.     Pulses: Normal pulses.  Heart sounds: Normal heart sounds.  Pulmonary:     Effort: Pulmonary effort is normal.     Breath sounds: Normal breath sounds.  Abdominal:     Palpations: Abdomen is soft. There is no hepatomegaly, splenomegaly or mass.     Tenderness: There is no abdominal tenderness.  Musculoskeletal:     Right lower leg: No edema.     Left lower leg: No edema.  Lymphadenopathy:     Cervical: No cervical adenopathy.     Right cervical: No superficial, deep or posterior cervical adenopathy.    Left cervical: No superficial, deep or posterior cervical adenopathy.     Upper Body:     Right upper body: No supraclavicular or axillary adenopathy.     Left upper body: No supraclavicular or axillary adenopathy.  Neurological:     General: No focal deficit present.     Mental Status: She is alert and oriented to person, place, and time.  Psychiatric:         Mood and Affect: Mood normal.        Behavior: Behavior normal.     LABS:      Latest Ref Rng & Units 11/19/2022    6:01 PM 11/02/2022    3:04 PM 09/28/2022   10:23 AM  CBC  WBC 4.0 - 10.5 K/uL 11.9  10.1  8.1   Hemoglobin 12.0 - 15.0 g/dL 54.0  98.1  19.1   Hematocrit 36.0 - 46.0 % 32.9  32.2  33.4   Platelets 150 - 400 K/uL 206  249  182       Latest Ref Rng & Units 11/19/2022    6:01 PM 11/02/2022    3:04 PM 09/28/2022   10:23 AM  CMP  Glucose 70 - 99 mg/dL 478  90  295   BUN 8 - 23 mg/dL 18  11  12    Creatinine 0.44 - 1.00 mg/dL 6.21  3.08  6.57   Sodium 135 - 145 mmol/L 134  134  134   Potassium 3.5 - 5.1 mmol/L 3.7  4.0  3.8   Chloride 98 - 111 mmol/L 100  105  102   CO2 22 - 32 mmol/L 25  21  23    Calcium 8.9 - 10.3 mg/dL 8.8  8.6  8.9   Total Protein 6.5 - 8.1 g/dL 6.5  6.4  6.5   Total Bilirubin 0.3 - 1.2 mg/dL 0.6  0.4  0.7   Alkaline Phos 38 - 126 U/L 74  83  73   AST 15 - 41 U/L 31  13  15    ALT 0 - 44 U/L 19  13  13       No results found for: "CEA1", "CEA" / No results found for: "CEA1", "CEA" No results found for: "PSA1" No results found for: "QIO962" No results found for: "CAN125"  No results found for: "TOTALPROTELP", "ALBUMINELP", "A1GS", "A2GS", "BETS", "BETA2SER", "GAMS", "MSPIKE", "SPEI" No results found for: "TIBC", "FERRITIN", "IRONPCTSAT" No results found for: "LDH"   STUDIES:   CT Angio Chest PE W and/or Wo Contrast  Result Date: 11/19/2022 CLINICAL DATA:  Chest pain and shortness of breath for 2 days, initial encounter EXAM: CT ANGIOGRAPHY CHEST WITH CONTRAST TECHNIQUE: Multidetector CT imaging of the chest was performed using the standard protocol during bolus administration of intravenous contrast. Multiplanar CT image reconstructions and MIPs were obtained to evaluate the vascular anatomy. RADIATION DOSE REDUCTION: This exam was performed according to the departmental dose-optimization program which  includes automated exposure control,  adjustment of the mA and/or kV according to patient size and/or use of iterative reconstruction technique. CONTRAST:  75mL OMNIPAQUE IOHEXOL 350 MG/ML SOLN COMPARISON:  Chest x-ray from earlier in the same day, CT from 11/02/2022 FINDINGS: Cardiovascular: Atherosclerotic calcifications of the thoracic aorta and its branches are noted. No aneurysmal dilatation or dissection is seen. Heart is at the upper limits of normal in size. The pulmonary artery shows a normal branching pattern bilaterally. No filling defect to suggest pulmonary embolism is noted. Mediastinum/Nodes: Thoracic inlet is within normal limits. The esophagus as visualized is within normal limits. Stable AP window lymph node is noted similar to that seen on the prior exam. Lungs/Pleura: Biapical pleural and parenchymal scarring is again identified. Postsurgical changes are noted in the right upper and lower lobes stable from the prior exam. Left lower lobe collapse is again identified and stable. Diffuse emphysematous changes are identified. Right-sided pleural effusion is again identified and stable. Previously described sub solid nodule in the right middle lobe centrally is again identified best seen on image number 85 of series 7. This is stable from the recent exam. Previously seen right upper lobe nodule is less well appreciated and may have been postinflammatory in nature. No new nodules are seen. Upper Abdomen: Visualized upper abdomen again shows stable right adrenal adenoma. No follow-up is recommended. Musculoskeletal: No acute bony abnormality is noted. Review of the MIP images confirms the above findings. IMPRESSION: Stable nodular changes in the right lung similar to that seen on the prior exam. Follow-up as previously described. No evidence of pulmonary embolism. Stable right sided postsurgical changes and right pleural effusion which appears loculated medially and posteriorly. Left lower lobe collapse stable from the prior exam. Aortic  Atherosclerosis (ICD10-I70.0) and Emphysema (ICD10-J43.9). Electronically Signed   By: Alcide Clever M.D.   On: 11/19/2022 21:13   DG Chest 2 View  Result Date: 11/19/2022 CLINICAL DATA:  Suspected sepsis EXAM: CHEST - 2 VIEW COMPARISON:  Chest x-ray 08/10/2022.  CT of the chest 11/02/2022 FINDINGS: Small right pleural effusion is unchanged. No new focal lung opacity identified. Triangular shaped density overlying the right heart border is likely related to epicardial fat pad as seen on comparison CT. There is scarring in the left costophrenic angle. There is no pneumothorax. Right chest port catheter tip ends in the SVC. Cardiomediastinal silhouette is otherwise within normal limits. No acute fractures are seen. IMPRESSION: Stable small right pleural effusion. No new focal lung opacity identified. Electronically Signed   By: Darliss Cheney M.D.   On: 11/19/2022 18:44   CT Chest W Contrast  Result Date: 11/09/2022 CLINICAL DATA:  Non-small cell lung cancer. Restaging. * Tracking Code: BO * EXAM: CT CHEST WITH CONTRAST TECHNIQUE: Multidetector CT imaging of the chest was performed during intravenous contrast administration. RADIATION DOSE REDUCTION: This exam was performed according to the departmental dose-optimization program which includes automated exposure control, adjustment of the mA and/or kV according to patient size and/or use of iterative reconstruction technique. CONTRAST:  75mL OMNIPAQUE IOHEXOL 300 MG/ML  SOLN COMPARISON:  PET-CT from 05/06/2022 . FINDINGS: Cardiovascular: normal heart size. No pericardial effusion. Aortic atherosclerosis and coronary artery calcifications. Mediastinum/Nodes: Thyroid gland, trachea, and esophagus are unremarkable. No mediastinal or hilar adenopathy identified. AP window lymph node is prominent measuring 1.2 cm unchanged from previous exam, image 55/2. Lungs/Pleura: Moderate emphysema. Postoperative changes from left lower lobectomy. Wedge resection has been  performed within the right upper lobe. Small right pleural effusion  identified. New from previous exam. Scattered areas of interstitial reticulation identified within the right lung. -Within the central peribronchovascular right middle lobe there is a new sub-solid nodule measuring 1.3 x 0.9 cm, image 88/4. -Small ground-glass nodule within the periphery of the right upper lobe measures 5 mm, image 81/4. Also new from previous exam. Interval complete atelectasis of the left lower lobe with compensatory hyperinflation of the left upper lobe. No central airway lesion identified to account for this finding. No suspicious lung nodules identified within the left lung. Upper Abdomen: No acute abnormality. Previous right adrenal adenoma is unchanged measuring 1.5 cm. No follow-up imaging recommended. Bilateral dilated renal collecting systems are identified which appears new from the PET-CT from 05/06/2022. This is only partially visualized and etiology is indeterminate. Aortic atherosclerotic calcifications. Musculoskeletal: Multiple remote, healed right lateral fracture deformities are identified. No acute or suspicious osseous findings. IMPRESSION: 1. Status post wedge resection from the right upper lobe and right lower lobectomy. 2. New small right pleural effusion. 3. Interval development of a sub-solid nodule within the central right middle lobe measuring 1.3 x 0.9 cm. Additionally there is a ground-glass nodule within the periphery of the right upper lobe which also appears new measuring 5 mm. These are both nonspecific but are favored to reflect postinflammatory change. Attention on follow-up imaging advised. 4. Interval development of complete atelectasis of the left lower lobe with compensatory hyperinflation of the left upper lobe. No central airway lesion identified to account for this finding. 5. Stable right adrenal adenoma. 6. Bilateral dilated renal collecting systems are identified which appears new from  the PET-CT from 05/06/2022. This is only partially visualized and etiology is indeterminate. Consider further evaluation with CT of the abdomen and pelvis. 7. Aortic Atherosclerosis (ICD10-I70.0) and Emphysema (ICD10-J43.9). Electronically Signed   By: Signa Kell M.D.   On: 11/09/2022 08:47

## 2022-11-23 NOTE — Telephone Encounter (Signed)
Was able to reach out to patient regarding follow up on reported rash from yesterday.  She was unable to upload photos to [facility], therefore asked her to come to office for Dr. Ellin Saba to assess.  Verbalized understanding.

## 2022-11-23 NOTE — Progress Notes (Signed)
Patient was a walk in for assessment of rash to right leg. Dr. Ellin Saba present and noted 3 small, non tender red spots to right leg.  No other rash noted, upon inspection.  Patient has requested no further Keytruda.  Per Dr. Ellin Saba, will schedule for 4 month follow up following repeat CT chest with contrast and labs prior to visit.  Patient in agreeable and verbalized understanding.

## 2022-11-25 ENCOUNTER — Other Ambulatory Visit: Payer: Self-pay

## 2022-12-01 ENCOUNTER — Inpatient Hospital Stay: Payer: PPO

## 2022-12-01 ENCOUNTER — Inpatient Hospital Stay: Payer: PPO | Admitting: Hematology

## 2022-12-02 ENCOUNTER — Inpatient Hospital Stay: Payer: PPO

## 2022-12-30 ENCOUNTER — Other Ambulatory Visit: Payer: Self-pay

## 2023-01-13 ENCOUNTER — Other Ambulatory Visit (HOSPITAL_COMMUNITY): Payer: Self-pay | Admitting: Adult Health

## 2023-01-13 DIAGNOSIS — Z1231 Encounter for screening mammogram for malignant neoplasm of breast: Secondary | ICD-10-CM

## 2023-01-17 ENCOUNTER — Ambulatory Visit (HOSPITAL_COMMUNITY)
Admission: RE | Admit: 2023-01-17 | Discharge: 2023-01-17 | Disposition: A | Discharge status: Home / Self Care | Payer: PPO | Source: Ambulatory Visit | Attending: Adult Health | Admitting: Adult Health

## 2023-01-17 ENCOUNTER — Encounter (HOSPITAL_COMMUNITY): Payer: Self-pay

## 2023-01-17 DIAGNOSIS — Z1231 Encounter for screening mammogram for malignant neoplasm of breast: Secondary | ICD-10-CM | POA: Diagnosis present

## 2023-03-24 ENCOUNTER — Ambulatory Visit (HOSPITAL_COMMUNITY)
Admission: RE | Admit: 2023-03-24 | Discharge: 2023-03-24 | Disposition: A | Discharge status: Home / Self Care | Payer: PPO | Source: Ambulatory Visit | Attending: Hematology | Admitting: Hematology

## 2023-03-24 ENCOUNTER — Inpatient Hospital Stay: Payer: PPO | Attending: Hematology

## 2023-03-24 VITALS — BP 150/89 | HR 90 | Temp 98.1°F | Resp 18

## 2023-03-24 DIAGNOSIS — Z09 Encounter for follow-up examination after completed treatment for conditions other than malignant neoplasm: Secondary | ICD-10-CM | POA: Insufficient documentation

## 2023-03-24 DIAGNOSIS — Z902 Acquired absence of lung [part of]: Secondary | ICD-10-CM | POA: Insufficient documentation

## 2023-03-24 DIAGNOSIS — C3431 Malignant neoplasm of lower lobe, right bronchus or lung: Secondary | ICD-10-CM | POA: Insufficient documentation

## 2023-03-24 DIAGNOSIS — F17210 Nicotine dependence, cigarettes, uncomplicated: Secondary | ICD-10-CM | POA: Insufficient documentation

## 2023-03-24 DIAGNOSIS — Z9221 Personal history of antineoplastic chemotherapy: Secondary | ICD-10-CM | POA: Insufficient documentation

## 2023-03-24 DIAGNOSIS — Z8541 Personal history of malignant neoplasm of cervix uteri: Secondary | ICD-10-CM | POA: Insufficient documentation

## 2023-03-24 DIAGNOSIS — Z90710 Acquired absence of both cervix and uterus: Secondary | ICD-10-CM | POA: Insufficient documentation

## 2023-03-24 DIAGNOSIS — R059 Cough, unspecified: Secondary | ICD-10-CM | POA: Insufficient documentation

## 2023-03-24 DIAGNOSIS — R0602 Shortness of breath: Secondary | ICD-10-CM | POA: Insufficient documentation

## 2023-03-24 DIAGNOSIS — Z923 Personal history of irradiation: Secondary | ICD-10-CM | POA: Insufficient documentation

## 2023-03-24 LAB — CBC WITH DIFFERENTIAL/PLATELET
Abs Immature Granulocytes: 0.04 10*3/uL (ref 0.00–0.07)
Basophils Absolute: 0.1 10*3/uL (ref 0.0–0.1)
Basophils Relative: 1 %
Eosinophils Absolute: 0.3 10*3/uL (ref 0.0–0.5)
Eosinophils Relative: 3 %
HCT: 37.7 % (ref 36.0–46.0)
Hemoglobin: 12.3 g/dL (ref 12.0–15.0)
Immature Granulocytes: 0 %
Lymphocytes Relative: 20 %
Lymphs Abs: 2.1 10*3/uL (ref 0.7–4.0)
MCH: 29.2 pg (ref 26.0–34.0)
MCHC: 32.6 g/dL (ref 30.0–36.0)
MCV: 89.5 fL (ref 80.0–100.0)
Monocytes Absolute: 0.5 10*3/uL (ref 0.1–1.0)
Monocytes Relative: 5 %
Neutro Abs: 7.6 10*3/uL (ref 1.7–7.7)
Neutrophils Relative %: 71 %
Platelets: 253 10*3/uL (ref 150–400)
RBC: 4.21 MIL/uL (ref 3.87–5.11)
RDW: 14.3 % (ref 11.5–15.5)
WBC: 10.6 10*3/uL — ABNORMAL HIGH (ref 4.0–10.5)
nRBC: 0 % (ref 0.0–0.2)

## 2023-03-24 LAB — COMPREHENSIVE METABOLIC PANEL
ALT: 12 U/L (ref 0–44)
AST: 17 U/L (ref 15–41)
Albumin: 3.4 g/dL — ABNORMAL LOW (ref 3.5–5.0)
Alkaline Phosphatase: 76 U/L (ref 38–126)
Anion gap: 10 (ref 5–15)
BUN: 14 mg/dL (ref 8–23)
CO2: 24 mmol/L (ref 22–32)
Calcium: 8.9 mg/dL (ref 8.9–10.3)
Chloride: 101 mmol/L (ref 98–111)
Creatinine, Ser: 1 mg/dL (ref 0.44–1.00)
GFR, Estimated: >60 mL/min (ref >60)
Glucose, Bld: 95 mg/dL (ref 70–99)
Potassium: 3.6 mmol/L (ref 3.5–5.1)
Sodium: 135 mmol/L (ref 135–145)
Total Bilirubin: 0.2 mg/dL (ref <1.2)
Total Protein: 6.9 g/dL (ref 6.5–8.1)

## 2023-03-24 LAB — TSH: TSH: 0.437 u[IU]/mL (ref 0.350–4.500)

## 2023-03-24 LAB — MAGNESIUM: Magnesium: 2.1 mg/dL (ref 1.7–2.4)

## 2023-03-24 MED ORDER — HEPARIN SOD (PORK) LOCK FLUSH 100 UNIT/ML IV SOLN
INTRAVENOUS | Status: AC
  Filled 2023-03-24: qty 5

## 2023-03-24 MED ORDER — IOHEXOL 300 MG/ML  SOLN
75.0000 mL | Freq: Once | INTRAMUSCULAR | Status: AC | PRN
  Administered 2023-03-24: 75 mL via INTRAVENOUS

## 2023-03-24 MED ORDER — HEPARIN SOD (PORK) LOCK FLUSH 100 UNIT/ML IV SOLN
500.0000 [IU] | Freq: Once | INTRAVENOUS | Status: AC
  Administered 2023-03-24: 500 [IU] via INTRAVENOUS

## 2023-03-24 MED ORDER — SODIUM CHLORIDE 0.9% FLUSH
10.0000 mL | Freq: Once | INTRAVENOUS | Status: AC
  Administered 2023-03-24: 10 mL via INTRAVENOUS

## 2023-03-24 NOTE — Progress Notes (Signed)
Patients port flushed without difficulty.  Good blood return noted with no bruising or swelling noted at site.  Patient to remain accessed for CT.

## 2023-03-31 ENCOUNTER — Inpatient Hospital Stay: Payer: PPO | Admitting: Hematology

## 2023-03-31 ENCOUNTER — Other Ambulatory Visit: Payer: PPO

## 2023-03-31 VITALS — BP 139/81 | HR 95 | Temp 98.3°F | Resp 18 | Wt 110.0 lb

## 2023-03-31 DIAGNOSIS — Z90710 Acquired absence of both cervix and uterus: Secondary | ICD-10-CM | POA: Diagnosis not present

## 2023-03-31 DIAGNOSIS — R059 Cough, unspecified: Secondary | ICD-10-CM | POA: Diagnosis not present

## 2023-03-31 DIAGNOSIS — R0602 Shortness of breath: Secondary | ICD-10-CM | POA: Diagnosis not present

## 2023-03-31 DIAGNOSIS — F17210 Nicotine dependence, cigarettes, uncomplicated: Secondary | ICD-10-CM | POA: Diagnosis not present

## 2023-03-31 DIAGNOSIS — C3431 Malignant neoplasm of lower lobe, right bronchus or lung: Secondary | ICD-10-CM

## 2023-03-31 DIAGNOSIS — Z902 Acquired absence of lung [part of]: Secondary | ICD-10-CM | POA: Diagnosis not present

## 2023-03-31 DIAGNOSIS — Z923 Personal history of irradiation: Secondary | ICD-10-CM | POA: Diagnosis not present

## 2023-03-31 DIAGNOSIS — Z8541 Personal history of malignant neoplasm of cervix uteri: Secondary | ICD-10-CM | POA: Diagnosis not present

## 2023-03-31 DIAGNOSIS — Z9221 Personal history of antineoplastic chemotherapy: Secondary | ICD-10-CM | POA: Diagnosis not present

## 2023-03-31 NOTE — Patient Instructions (Signed)
[facility] at [facility] Discharge Instructions   You were seen and examined today by Dr. Ellin Saba.  He reviewed the results of your lab work which are normal/stable.   Your CT results from today are pending. We will call you with the results.   We will see you back in 6 months. We will repeat lab work and a CT scan prior to this visit.   Return as scheduled.    Thank you for choosing [facility] at [facility] to provide your oncology and hematology care.  To afford each patient quality time with our provider, please arrive at least 15 minutes before your scheduled appointment time.   If you have a lab appointment with the Cancer Center please come in thru the Main Entrance and check in at the main information desk.  You need to re-schedule your appointment should you arrive 10 or more minutes late.  We strive to give you quality time with our providers, and arriving late affects you and other patients whose appointments are after yours.  Also, if you no show three or more times for appointments you may be dismissed from the clinic at the providers discretion.     Again, thank you for choosing [facility].  Our hope is that these requests will decrease the amount of time that you wait before being seen by our physicians.       _____________________________________________________________  Should you have questions after your visit to [facility], please contact our office at [phone] and follow the prompts.  Our office hours are 8:00 a.m. and 4:30 p.m. Monday - Friday.  Please note that voicemails left after 4:00 p.m. may not be returned until the following business day.  We are closed weekends and major holidays.  You do have access to a nurse 24-7, just call the main number to the clinic [phone] and do not press any options, hold on the line and a nurse will answer the phone.    For prescription refill  requests, have your pharmacy contact our office and allow 72 hours.    Due to Covid, you will need to wear a mask upon entering the hospital. If you do not have a mask, a mask will be given to you at the Main Entrance upon arrival. For doctor visits, patients may have 1 support person age 35 or older with them. For treatment visits, patients can not have anyone with them due to social distancing guidelines and our immunocompromised population.

## 2023-03-31 NOTE — Progress Notes (Signed)
[facility] [address], [place] [address]    Clinic Day:  03/31/2023  Referring physician: Pearson Grippe, MD  Patient Care Team: Pearson Grippe, MD as PCP - General (Internal Medicine) Venita Lick, MD as Consulting Physician (Orthopedic Surgery) Rourk, Gerrit Friends, MD as Consulting Physician (Gastroenterology) Doreatha Massed, MD as Medical Oncologist (Medical Oncology) Therese Sarah, RN as Oncology Nurse Navigator (Medical Oncology)   ASSESSMENT & PLAN:   Assessment: 1.  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 - 18 pound weight loss in the last 3 months due to decreased appetite - Chest x-ray on 04/17/2022: Mass in the right lower lobe. - CT chest (04/26/2022): 3.8 cm cavitary mass in the anterior right lower lobe abutting the major fissure.  4 mm subpleural nodule in the medial right upper lobe is nonspecific.  No adenopathy on noncontrast exam. - PET scan (05/06/2022): 3.8 x 2.3 cm hypermetabolic right lower lobe mass SUV 17.8.  Mildly metabolic asymmetric right hilar nodal tissue with SUV 4.2.  Prominent precarinal lymph node 9 mm with SUV 2.2. - Brain MRI (05/19/2022): No evidence of intracranial metastatic disease. - Bronchoscopy and biopsy by Dr. Tonia Brooms. - Pathology: Lymph node 11 R, 7 with no malignant cells.  Right lower lobe cavitary mass FNA consistent with squamous cell carcinoma, keratinizing. - 06/14/2022: Robotic assisted right lower lobectomy, lymph node dissection by Dr. Dorris Fetch. - Pathology: 4.3 x 3.9 x 2.8 cm right lower lobe squamous cell carcinoma, margins negative to.  Negative visceral pleural invasion and lymphovascular invasion.  0/18 lymph nodes involved.  Nodal stations examined include 4, 7, 8, 9, 10, 11, 12.  PT2b pN0. - 4 cycles of docetaxel and carboplatin from 07/26/2022 through 09/28/2022. - NGS: PD-L1 TPS 30%, TMB-high.  Negative for  ALK/EGFR/BRAF/KRAS/MET/RET/ROS1.  Positive for PIK3CA and T p53. - Adjuvant pembrolizumab for 1 year started on 11/10/2022, discontinued after cycle 1.   2.  Social/family history: - Lives at home with her husband [name]. - She has been on disability since she was diagnosed with cervical cancer.  Prior to that she worked at a [employer].  No chemical exposure. - She is independent of ADLs and IADLs. - Current active smoker and started smoking 1 pack/day at age 19. - Maternal aunt had breast cancer.   3.  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 - XRT 30 Gray in 5 fractions to the vaginal cuff from 02/22/2017 through 03/22/2017.    Plan: 1.  Stage IIa (T2b N0) right lower lobe squamous cell carcinoma: - She reports mild fatigue.  Weight is stable.  She is continuing to smoke half pack of cigarettes per day. - Reviewed labs from 03/24/2023: Normal LFTs.  CBC grossly normal.  TSH is normal at 0.4. - Reviewed CT chest images which did not show any clear recurrence.  However radiology report is pending. - RTC 6 months for follow-up with repeat CT of the chest and labs.      Orders Placed This Encounter  Procedures   CT CHEST W CONTRAST    Standing Status:   Future    Expected Date:   09/29/2023    Expiration Date:   03/30/2024    If indicated for the ordered procedure, I authorize the administration of contrast media per Radiology protocol:   Yes    Does  the patient have a contrast media/X-ray dye allergy?:   No    Preferred imaging location?:   [facility]   TSH    Standing Status:   Future    Expected Date:   09/22/2023    Expiration Date:   03/30/2024   CBC with Differential    Standing Status:   Future    Expected Date:   09/22/2023    Expiration Date:   03/30/2024   Comprehensive metabolic panel    Standing Status:   Future    Expected Date:   09/22/2023    Expiration  Date:   03/30/2024      Mikeal Hawthorne R Teague,acting as a scribe for Doreatha Massed, MD.,have documented all relevant documentation on the behalf of Doreatha Massed, MD,as directed by  Doreatha Massed, MD while in the presence of Doreatha Massed, MD.  I, Doreatha Massed MD, have reviewed the above documentation for accuracy and completeness, and I agree with the above.     Doreatha Massed, MD   12/19/20245:32 PM  CHIEF COMPLAINT:   Diagnosis: RLL lung cancer    Cancer Staging  Primary squamous cell carcinoma of lower lobe of right lung (HCC) Staging form: Lung, AJCC 8th Edition - Clinical stage from 05/23/2022: Stage IIA (cT2b, cN0, cM0) - Unsigned    Prior Therapy: RLL lobectomy and LND, 06/14/22 (Dr. Dorris Fetch)   Current Therapy:  Adjuvant chemotherapy with carboplatin and docetaxel    HISTORY OF PRESENT ILLNESS:   Oncology History  Primary squamous cell carcinoma of lower lobe of right lung (HCC)  05/23/2022 Initial Diagnosis   Primary squamous cell carcinoma of lower lobe of right lung   07/26/2022 - 09/30/2022 Chemotherapy   Patient is on Treatment Plan : LUNG Carboplatin (AUC 6) + Docetaxel (75) q21d x 4 cycles     11/10/2022 -  Chemotherapy   Patient is on Treatment Plan : LUNG NSCLC Pembrolizumab (200) q21d        INTERVAL HISTORY:   [name] is a 67 y.o. female presenting to the clinic today for follow-up of RLL lung cancer. She was last seen by me on 11/23/22.  Since her last visit, she underwent CT chest on 03/24/23.  Today, she states that she is doing well overall. Her appetite level is at 60%. Her energy level is at 60%. She notes consistent fatigue. She reports occasional pain at incision site from lobectomy surgery. Her weight, cough, and SOB remain stable. She does not take any prednisone on a daily basis. She is not taking Potassium. She currently smokes 0.5 ppd.   PAST MEDICAL HISTORY:   Past Medical History: Past  Medical History:  Diagnosis Date   Anxiety    Arthritis    spinal stenosis   Back disorder    "crooked spine"   Bulging lumbar disc    Cervical cancer (HCC)    Complication of anesthesia    ? hypotension 10/2016 at [facility] surgery    Depression    Dyspnea    H/O degenerative disc disease    History of radiation therapy 02/22/17-03/22/17   vaginal cuff 30 Gy in 5 fractions   Hypertension    Hypothyroidism    patient taken off of hypothyroid med in 11/2016    Osteoporosis    Thyroid disease     Surgical History: Past Surgical History:  Procedure Laterality Date   ABDOMINAL EXPOSURE N/A 09/27/2018   Procedure: ABDOMINAL EXPOSURE;  Surgeon: Nada Libman, MD;  Location: [facility]  OR;  Service: Vascular;  Laterality: N/A;   ABDOMINAL HYSTERECTOMY     ANTERIOR AND POSTERIOR REPAIR N/A 11/09/2016   Procedure: ANTERIOR (CYSTOCELE) AND POSTERIOR REPAIR (RECTOCELE);  Surgeon: Tilda Burrow, MD;  Location: [facility] ORS;  Service: Gynecology;  Laterality: N/A;   ANTERIOR LUMBAR FUSION Left 09/27/2018   Procedure: ANTERIOR LUMBAR FUSION L5-S1,;  Surgeon: Venita Lick, MD;  Location: [facility] OR;  Service: Orthopedics;  Laterality: Left;  4.5 HRS/ DR. Myra Gianotti ASSIST   BACK SURGERY     BILATERAL SALPINGECTOMY Left 11/09/2016   Procedure: LEFT SALPINGECTOMY;  Surgeon: Tilda Burrow, MD;  Location: [facility] ORS;  Service: Gynecology;  Laterality: Left;   BRONCHIAL BIOPSY  05/13/2022   Procedure: BRONCHIAL BIOPSIES;  Surgeon: Josephine Igo, DO;  Location: [facility] ENDOSCOPY;  Service: Pulmonary;;   BRONCHIAL BRUSHINGS  05/13/2022   Procedure: BRONCHIAL BRUSHINGS;  Surgeon: Josephine Igo, DO;  Location: [facility] ENDOSCOPY;  Service: Pulmonary;;   BRONCHIAL NEEDLE ASPIRATION BIOPSY  05/13/2022   Procedure: BRONCHIAL NEEDLE ASPIRATION BIOPSIES;  Surgeon: Josephine Igo, DO;  Location: [facility] ENDOSCOPY;  Service: Pulmonary;;   CATARACT EXTRACTION W/PHACO Right 07/05/2016   Procedure: CATARACT EXTRACTION PHACO AND  INTRAOCULAR LENS PLACEMENT (IOC);  Surgeon: Gemma Payor, MD;  Location: [facility] ORS;  Service: Ophthalmology;  Laterality: Right;  CDE: 15.41   CATARACT EXTRACTION W/PHACO Left 07/19/2016   Procedure: CATARACT EXTRACTION PHACO AND INTRAOCULAR LENS PLACEMENT LEFT EYE CDE= 12.65;  Surgeon: Gemma Payor, MD;  Location: [facility] ORS;  Service: Ophthalmology;  Laterality: Left;  left   COLONOSCOPY WITH PROPOFOL N/A 09/09/2021   Procedure: COLONOSCOPY WITH PROPOFOL;  Surgeon: Corbin Ade, MD;  Location: [facility] END[facility] SU[facility];  Service: Endoscopy;  Laterality: N/A;  10:30am   EYE SURGERY     FINE NEEDLE ASPIRATION  05/13/2022   Procedure: FINE NEEDLE ASPIRATION (FNA) LINEAR;  Surgeon: Josephine Igo, DO;  Location: [facility] ENDOS[facility];  Service: Pulmonary;;   INTERCOSTAL NERVE BLOCK  06/14/2022   Procedure: INTERCOSTAL NERVE BLOCK;  Surgeon: Loreli Slot, MD;  Location: [facility] OR;  Service: Thoracic;;   IR IMAGING GUIDED PORT INSERTION  07/21/2022   LUMBAR LAMINECTOMY/DECOMPRESSION MICRODISCECTOMY Left 09/27/2018   Procedure: L4-L5 Lumbar Laminectomy/Decompression;  Surgeon: Venita Lick, MD;  Location: [facility] OR;  Service: Orthopedics;  Laterality: Left;   NODE DISSECTION Right 06/14/2022   Procedure: NODE DISSECTION;  Surgeon: Loreli Slot, MD;  Location: [facility] OR;  Service: Thoracic;  Laterality: Right;   POLYPECTOMY  09/09/2021   Procedure: POLYPECTOMY;  Surgeon: Corbin Ade, MD;  Location: [facility];  Service: Endoscopy;;   ROBOTIC PELVIC AND PARA-AORTIC LYMPH NODE DISSECTION N/A 01/11/2017   Procedure: XI ROBOTIC BILATERAL PELVIC LYMPH NODE DISSECTION;  Surgeon: Adolphus Birchwood, MD;  Location: [facility] ORS;  Service: Gynecology;  Laterality: N/A;   SALPINGOOPHORECTOMY Right 11/09/2016   Procedure: RIGHT SALPINGO OOPHORECTOMY;  Surgeon: Tilda Burrow, MD;  Location: [facility];  Service: Gynecology;  Laterality: Right;   TUBAL LIGATION     VAGINAL HYSTERECTOMY N/A 11/09/2016   Procedure: HYSTERECTOMY VAGINAL;  Surgeon:  Tilda Burrow, MD;  Location: [facility];  Service: Gynecology;  Laterality: N/A;   VIDEO BRONCHOSCOPY WITH ENDOBRONCHIAL ULTRASOUND Right 05/13/2022   Procedure: VIDEO BRONCHOSCOPY WITH ENDOBRONCHIAL ULTRASOUND;  Surgeon: Josephine Igo, DO;  Location: [facility] END[facility];  Service: Pulmonary;  Laterality: Right;    Social History: Social History   Socioeconomic History   Marital status: Legally Separated    Spouse name: Not on file  Number of children: 1   Years of education: Not on file   Highest education level: Not on file  Occupational History   Not on file  Tobacco Use   Smoking status: Every Day    Current packs/day: 1.00    Average packs/day: 1 pack/day for 44.0 years (44.0 ttl pk-yrs)    Types: Cigarettes   Smokeless tobacco: Never   Tobacco comments:    Smokes 1/2 ppd per pt. 08/10/22  Vaping Use   Vaping status: Never Used  Substance and Sexual Activity   Alcohol use: No    Comment: 01-06-2016 Per pt rarely, 02-05-2016 per pt no but 75yrs ago     Drug use: No    Comment: 02-05-2016 per pt no but about 40 yrs ago   Sexual activity: Not Currently    Birth control/protection: Surgical    Comment: hyst  Other Topics Concern   Not on file  Social History Narrative   Not on file   Social Drivers of Health   Financial Resource Strain: Unknown (01/20/2021)   Overall Financial Resource Strain (CARDIA)    Difficulty of Paying Living Expenses: Patient declined  Food Insecurity: No Food Insecurity (05/04/2022)   Hunger Vital Sign    Worried About Running Out of Food in the Last Year: Never true    Ran Out of Food in the Last Year: Never true  Transportation Needs: No Transportation Needs (05/04/2022)   PRAPARE - Administrator, Civil Service (Medical): No    Lack of Transportation (Non-Medical): No  Physical Activity: Inactive (01/20/2021)   Exercise Vital Sign    Days of Exercise per Week: 1 day    Minutes of Exercise per Session: 0 min  Stress: Stress Concern  Present (01/20/2021)   [employer] of Occupational Health - Occupational Stress Questionnaire    Feeling of Stress : Very much  Social Connections: Moderately Isolated (01/20/2021)   Social Connection and Isolation Panel [NHANES]    Frequency of Communication with Friends and Family: Twice a week    Frequency of Social Gatherings with Friends and Family: Never    Attends Religious Services: More than 4 times per year    Active Member of [employer] or Organizations: No    Attends Banker Meetings: Never    Marital Status: Married  Catering manager Violence: Not At Risk (05/04/2022)   Humiliation, Afraid, Rape, and Kick questionnaire    Fear of Current or Ex-Partner: No    Emotionally Abused: No    Physically Abused: No    Sexually Abused: No    Family History: Family History  Problem Relation Age of Onset   Hypertension Mother    Congenital heart disease Mother    Diabetes Mother    Thyroid disease Brother    Other Daughter        bowel issues   Colon cancer Neg Hx    Colon polyps Neg Hx     Current Medications:  Current Outpatient Medications:    acetaminophen (TYLENOL) 325 MG tablet, Take 650 mg by mouth every 6 (six) hours as needed for headache., Disp: , Rfl:    albuterol (VENTOLIN HFA) 108 (90 Base) MCG/ACT inhaler, INHALE 2 PUFFS INTO THE LUNGS EVERY 6 HOURS AS NEEDED FOR WHEEZING OR SHORTNESS OF BREATH., Disp: 8.5 g, Rfl: 2   alendronate (FOSAMAX) 70 MG tablet, Take 70 mg by mouth once a week., Disp: , Rfl:    ALPRAZolam (XANAX) 1 MG tablet, Take 1 mg  by mouth at bedtime. May take additional 1 mg during the day if needed, Disp: , Rfl:    atorvastatin (LIPITOR) 40 MG tablet, Take 40 mg by mouth at bedtime. , Disp: , Rfl:    benzonatate (TESSALON) 100 MG capsule, Take 100 mg by mouth 3 (three) times daily as needed for cough., Disp: , Rfl:    FLUoxetine (PROZAC) 40 MG capsule, Take 40 mg by mouth daily., Disp: , Rfl:    Fluticasone-Umeclidin-Vilant (TRELEGY  ELLIPTA) 100-62.5-25 MCG/ACT AEPB, Inhale 1 each into the lungs daily., Disp: 60 each, Rfl: 2   lidocaine-prilocaine (EMLA) cream, Apply a quarter-sized amount to port a cath site and cover with plastic wrap one hour prior to infusion appointments, Disp: 30 g, Rfl: 3   traZODone (DESYREL) 100 MG tablet, Take 100 mg by mouth at bedtime., Disp: , Rfl:    hydrochlorothiazide (HYDRODIURIL) 25 MG tablet, Take 1 tablet (25 mg total) by mouth daily. (Patient not taking: Reported on 11/09/2022), Disp: 90 tablet, Rfl: 3   Allergies: Allergies  Allergen Reactions   Zithromax [Azithromycin] Rash and Other (See Comments)    Blisters in mouth    Prednisone Nausea Only    Sick to stomach Can take in low dose    REVIEW OF SYSTEMS:   Review of Systems  Constitutional:  Positive for fatigue. Negative for chills and fever.  HENT:   Positive for trouble swallowing (occasional). Negative for lump/mass, mouth sores, nosebleeds and sore throat.   Eyes:  Negative for eye problems.  Respiratory:  Positive for cough and shortness of breath.   Cardiovascular:  Negative for chest pain, leg swelling and palpitations.  Gastrointestinal:  Negative for abdominal pain, constipation, diarrhea, nausea and vomiting.  Genitourinary:  Negative for bladder incontinence, difficulty urinating, dysuria, frequency, hematuria and nocturia.        +cloudy appearance of urine  Musculoskeletal:  Negative for arthralgias, back pain, flank pain, myalgias and neck pain.       +right-sided abdominal pain, 5/10 severity  Skin:  Negative for itching and rash.  Neurological:  Positive for dizziness. Negative for headaches and numbness.  Hematological:  Does not bruise/bleed easily.  Psychiatric/Behavioral:  Negative for depression, sleep disturbance and suicidal ideas. The patient is not nervous/anxious.   All other systems reviewed and are negative.    VITALS:   Blood pressure 139/81, pulse 95, temperature 98.3 F (36.8 C),  temperature source Oral, resp. rate 18, weight 110 lb (49.9 kg), SpO2 95%.  Wt Readings from Last 3 Encounters:  03/31/23 110 lb (49.9 kg)  11/19/22 118 lb (53.5 kg)  11/09/22 118 lb (53.5 kg)    Body mass index is 17.75 kg/m.  Performance status (ECOG): 1 - Symptomatic but completely ambulatory  PHYSICAL EXAM:   Physical Exam Vitals and nursing note reviewed. Exam conducted with a chaperone present.  Constitutional:      Appearance: Normal appearance.  Cardiovascular:     Rate and Rhythm: Normal rate and regular rhythm.     Pulses: Normal pulses.     Heart sounds: Normal heart sounds.  Pulmonary:     Effort: Pulmonary effort is normal.     Breath sounds: Examination of the right-middle field reveals wheezing. Examination of the left-middle field reveals wheezing. Wheezing present.  Abdominal:     Palpations: Abdomen is soft. There is no hepatomegaly, splenomegaly or mass.     Tenderness: There is abdominal tenderness (with no masses palpable) in the right upper quadrant.  Musculoskeletal:  Right lower leg: No edema.     Left lower leg: No edema.  Lymphadenopathy:     Cervical: No cervical adenopathy.     Right cervical: No superficial, deep or posterior cervical adenopathy.    Left cervical: No superficial, deep or posterior cervical adenopathy.     Upper Body:     Right upper body: No supraclavicular or axillary adenopathy.     Left upper body: No supraclavicular or axillary adenopathy.  Neurological:     General: No focal deficit present.     Mental Status: She is alert and oriented to person, place, and time.  Psychiatric:        Mood and Affect: Mood normal.        Behavior: Behavior normal.     LABS:      Latest Ref Rng & Units 03/24/2023    2:08 PM 11/19/2022    6:01 PM 11/02/2022    3:04 PM  CBC  WBC 4.0 - 10.5 K/uL 10.6  11.9  10.1   Hemoglobin 12.0 - 15.0 g/dL 16.1  09.6  04.5   Hematocrit 36.0 - 46.0 % 37.7  32.9  32.2   Platelets 150 - 400 K/uL 253   206  249       Latest Ref Rng & Units 03/24/2023    2:06 PM 11/19/2022    6:01 PM 11/02/2022    3:04 PM  CMP  Glucose 70 - 99 mg/dL 95  409  90   BUN 8 - 23 mg/dL 14  18  11    Creatinine 0.44 - 1.00 mg/dL 8.11  9.14  7.82   Sodium 135 - 145 mmol/L 135  134  134   Potassium 3.5 - 5.1 mmol/L 3.6  3.7  4.0   Chloride 98 - 111 mmol/L 101  100  105   CO2 22 - 32 mmol/L 24  25  21    Calcium 8.9 - 10.3 mg/dL 8.9  8.8  8.6   Total Protein 6.5 - 8.1 g/dL 6.9  6.5  6.4   Total Bilirubin <1.2 mg/dL 0.2  0.6  0.4   Alkaline Phos 38 - 126 U/L 76  74  83   AST 15 - 41 U/L 17  31  13    ALT 0 - 44 U/L 12  19  13       No results found for: "CEA1", "CEA" / No results found for: "CEA1", "CEA" No results found for: "PSA1" No results found for: "NFA213" No results found for: "CAN125"  No results found for: "TOTALPROTELP", "ALBUMINELP", "A1GS", "A2GS", "BETS", "BETA2SER", "GAMS", "MSPIKE", "SPEI" No results found for: "TIBC", "FERRITIN", "IRONPCTSAT" No results found for: "LDH"   STUDIES:   No results found.

## 2023-04-01 ENCOUNTER — Other Ambulatory Visit: Payer: Self-pay

## 2023-04-04 ENCOUNTER — Other Ambulatory Visit: Payer: Self-pay | Admitting: *Deleted

## 2023-04-04 ENCOUNTER — Telehealth: Payer: Self-pay | Admitting: *Deleted

## 2023-04-04 MED ORDER — AMOXICILLIN-POT CLAVULANATE 875-125 MG PO TABS: 1.0000 | ORAL_TABLET | Freq: Two times a day (BID) | ORAL | 0 refills | Status: AC

## 2023-04-04 MED ORDER — DOXYCYCLINE HYCLATE 100 MG PO TABS: 100.0000 mg | ORAL_TABLET | Freq: Two times a day (BID) | ORAL | 0 refills | Status: AC

## 2023-04-04 NOTE — Telephone Encounter (Signed)
Per Dr. Ellin Saba, CT results discussed.  Patient continues to experience cough and SOB.  Scripts sent for doxycycline 100 mg bid x 7 days along with Augmentin 875 mg bid x 7 days.  Patient verbalized understanding.

## 2023-07-01 ENCOUNTER — Inpatient Hospital Stay: Payer: PPO | Attending: Hematology

## 2023-07-01 DIAGNOSIS — Z452 Encounter for adjustment and management of vascular access device: Secondary | ICD-10-CM | POA: Diagnosis present

## 2023-07-01 DIAGNOSIS — C3431 Malignant neoplasm of lower lobe, right bronchus or lung: Secondary | ICD-10-CM | POA: Insufficient documentation

## 2023-07-01 MED ORDER — SODIUM CHLORIDE 0.9% FLUSH
10.0000 mL | Freq: Once | INTRAVENOUS | Status: AC
  Administered 2023-07-01: 10 mL via INTRAVENOUS

## 2023-07-01 MED ORDER — HEPARIN SOD (PORK) LOCK FLUSH 100 UNIT/ML IV SOLN
500.0000 [IU] | Freq: Once | INTRAVENOUS | Status: AC
  Administered 2023-07-01: 500 [IU] via INTRAVENOUS

## 2023-07-01 NOTE — Patient Instructions (Signed)
 [facility] [place] - A DEPT OF [facility]  Discharge Instructions: Thank you for choosing [facility] to provide your oncology and hematology care.  If you have a lab appointment with the Cancer Center - please note that after April 8th, 2024, all labs will be drawn in the cancer center.  You do not have to check in or register with the main entrance as you have in the past but will complete your check-in in the cancer center.  Wear comfortable clothing and clothing appropriate for easy access to any Portacath or PICC line.   We strive to give you quality time with your provider. You may need to reschedule your appointment if you arrive late (15 or more minutes).  Arriving late affects you and other patients whose appointments are after yours.  Also, if you miss three or more appointments without notifying the office, you may be dismissed from the clinic at the provider's discretion.      For prescription refill requests, have your pharmacy contact our office and allow 72 hours for refills to be completed.    Today you received the following chemotherapy and/or immunotherapy agents Port Flush      To help prevent nausea and vomiting after your treatment, we encourage you to take your nausea medication as directed.  BELOW ARE SYMPTOMS THAT SHOULD BE REPORTED IMMEDIATELY: *FEVER GREATER THAN 100.4 F (38 C) OR HIGHER *CHILLS OR SWEATING *NAUSEA AND VOMITING THAT IS NOT CONTROLLED WITH YOUR NAUSEA MEDICATION *UNUSUAL SHORTNESS OF BREATH *UNUSUAL BRUISING OR BLEEDING *URINARY PROBLEMS (pain or burning when urinating, or frequent urination) *BOWEL PROBLEMS (unusual diarrhea, constipation, pain near the anus) TENDERNESS IN MOUTH AND THROAT WITH OR WITHOUT PRESENCE OF ULCERS (sore throat, sores in mouth, or a toothache) UNUSUAL RASH, SWELLING OR PAIN  UNUSUAL VAGINAL DISCHARGE OR ITCHING   Items with * indicate a potential emergency and should be followed  up as soon as possible or go to the Emergency Department if any problems should occur.  Please show the CHEMOTHERAPY ALERT CARD or IMMUNOTHERAPY ALERT CARD at check-in to the Emergency Department and triage nurse.  Should you have questions after your visit or need to cancel or reschedule your appointment, please contact [facility] [place] - A DEPT OF Eligha Brideg[facility] [phone]  and follow the prompts.  Office hours are 8:00 a.m. to 4:30 p.m. Monday - Friday. Please note that voicemails left after 4:00 p.m. may not be returned until the following business day.  We are closed weekends and major holidays. You have access to a nurse at all times for urgent questions. Please call the main number to the clinic [phone] and follow the prompts.  For any non-urgent questions, you may also contact your provider using MyChart. We now offer e-Visits for anyone 66 and older to request care online for non-urgent symptoms. For details visit [email or web address].   Also download the MyChart app! Go to the app store, search "MyChart", open the app, select [place], and log in with your MyChart username and password.

## 2023-07-01 NOTE — Progress Notes (Signed)
 Patients port flushed without difficulty.  Good blood return noted with no bruising or swelling noted at site.  Band aid applied.  VSS with discharge and left in satisfactory condition with no s/s of distress noted.

## 2023-09-29 ENCOUNTER — Ambulatory Visit (HOSPITAL_COMMUNITY)
Admission: RE | Admit: 2023-09-29 | Discharge: 2023-09-29 | Disposition: A | Discharge status: Home / Self Care | Payer: PPO | Source: Ambulatory Visit | Attending: Hematology | Admitting: Hematology

## 2023-09-29 ENCOUNTER — Inpatient Hospital Stay: Payer: PPO | Attending: Hematology

## 2023-09-29 DIAGNOSIS — C3431 Malignant neoplasm of lower lobe, right bronchus or lung: Secondary | ICD-10-CM | POA: Diagnosis present

## 2023-09-29 DIAGNOSIS — F17210 Nicotine dependence, cigarettes, uncomplicated: Secondary | ICD-10-CM | POA: Insufficient documentation

## 2023-09-29 LAB — CBC WITH DIFFERENTIAL/PLATELET
Abs Immature Granulocytes: 0.02 10*3/uL (ref 0.00–0.07)
Basophils Absolute: 0.1 10*3/uL (ref 0.0–0.1)
Basophils Relative: 1 %
Eosinophils Absolute: 0.2 10*3/uL (ref 0.0–0.5)
Eosinophils Relative: 2 %
HCT: 38 % (ref 36.0–46.0)
Hemoglobin: 12 g/dL (ref 12.0–15.0)
Immature Granulocytes: 0 %
Lymphocytes Relative: 28 %
Lymphs Abs: 2.6 10*3/uL (ref 0.7–4.0)
MCH: 28.3 pg (ref 26.0–34.0)
MCHC: 31.6 g/dL (ref 30.0–36.0)
MCV: 89.6 fL (ref 80.0–100.0)
Monocytes Absolute: 0.5 10*3/uL (ref 0.1–1.0)
Monocytes Relative: 5 %
Neutro Abs: 5.8 10*3/uL (ref 1.7–7.7)
Neutrophils Relative %: 64 %
Platelets: 288 10*3/uL (ref 150–400)
RBC: 4.24 MIL/uL (ref 3.87–5.11)
RDW: 14.1 % (ref 11.5–15.5)
WBC: 9.1 10*3/uL (ref 4.0–10.5)
nRBC: 0 % (ref 0.0–0.2)

## 2023-09-29 LAB — COMPREHENSIVE METABOLIC PANEL WITH GFR
ALT: 14 U/L (ref 0–44)
AST: 17 U/L (ref 15–41)
Albumin: 3.2 g/dL — ABNORMAL LOW (ref 3.5–5.0)
Alkaline Phosphatase: 85 U/L (ref 38–126)
Anion gap: 10 (ref 5–15)
BUN: 10 mg/dL (ref 8–23)
CO2: 23 mmol/L (ref 22–32)
Calcium: 9 mg/dL (ref 8.9–10.3)
Chloride: 103 mmol/L (ref 98–111)
Creatinine, Ser: 0.94 mg/dL (ref 0.44–1.00)
GFR, Estimated: >60 mL/min (ref >60)
Glucose, Bld: 86 mg/dL (ref 70–99)
Potassium: 3.8 mmol/L (ref 3.5–5.1)
Sodium: 136 mmol/L (ref 135–145)
Total Bilirubin: 0.4 mg/dL (ref 0.0–1.2)
Total Protein: 6.9 g/dL (ref 6.5–8.1)

## 2023-09-29 LAB — MAGNESIUM: Magnesium: 1.9 mg/dL (ref 1.7–2.4)

## 2023-09-29 MED ORDER — SODIUM CHLORIDE 0.9% FLUSH: 10.0000 mL | INTRAVENOUS | Status: DC | PRN

## 2023-09-29 MED ORDER — HEPARIN SOD (PORK) LOCK FLUSH 100 UNIT/ML IV SOLN
INTRAVENOUS | Status: AC
  Filled 2023-09-29: qty 5

## 2023-09-29 MED ORDER — IOHEXOL 300 MG/ML  SOLN
75.0000 mL | Freq: Once | INTRAMUSCULAR | Status: AC | PRN
  Administered 2023-09-29: 75 mL via INTRAVENOUS

## 2023-09-29 NOTE — Progress Notes (Signed)
 [name] presented for Portacath access and flush with labs.  Portacath located right chest wall accessed with  H 20 needle.  Good blood return present. Patient remains accessed for Ct scan.Procedure tolerated well and without incident.     Discharged from clinic ambulatory in stable condition. Alert and oriented x 3. F/U with [facility] as scheduled.

## 2023-10-06 ENCOUNTER — Inpatient Hospital Stay: Payer: PPO | Admitting: Hematology

## 2023-10-06 VITALS — BP 115/77 | HR 101 | Temp 98.2°F | Resp 20 | Wt 121.3 lb

## 2023-10-06 DIAGNOSIS — C3431 Malignant neoplasm of lower lobe, right bronchus or lung: Secondary | ICD-10-CM | POA: Diagnosis not present

## 2023-10-06 MED ORDER — LEVOFLOXACIN 500 MG PO TABS: 500.0000 mg | ORAL_TABLET | Freq: Every day | ORAL | 0 refills | Status: DC

## 2023-10-06 NOTE — Progress Notes (Signed)
 [facility] [address], [place] [address]    Clinic Day:  10/06/2023  Referring physician: Luke Agent, MD  Patient Care Team: Amber Agent, MD (Inactive) as PCP - General (Internal Medicine) Amber Aures, MD as Consulting Physician (Orthopedic Surgery) Rourk, Lamar HERO, MD as Consulting Physician (Gastroenterology) Amber Hai, MD as Medical Oncologist (Medical Oncology) Amber Joesph SQUIBB, RN as Oncology Nurse Navigator (Medical Oncology)   ASSESSMENT & PLAN:   Assessment: 1.  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 - 18 pound weight loss in the last 3 months due to decreased appetite - Chest x-ray on 04/17/2022: Mass in the right lower lobe. - CT chest (04/26/2022): 3.8 cm cavitary mass in the anterior right lower lobe abutting the major fissure.  4 mm subpleural nodule in the medial right upper lobe is nonspecific.  No adenopathy on noncontrast exam. - PET scan (05/06/2022): 3.8 x 2.3 cm hypermetabolic right lower lobe mass SUV 17.8.  Mildly metabolic asymmetric right hilar nodal tissue with SUV 4.2.  Prominent precarinal lymph node 9 mm with SUV 2.2. - Brain MRI (05/19/2022): No evidence of intracranial metastatic disease. - Bronchoscopy and biopsy by Dr. Brenna. - Pathology: Lymph node 11 R, 7 with no malignant cells.  Right lower lobe cavitary mass FNA consistent with squamous cell carcinoma, keratinizing. - 06/14/2022: Robotic assisted right lower lobectomy, lymph node dissection by Dr. Kerrin. - Pathology: 4.3 x 3.9 x 2.8 cm right lower lobe squamous cell carcinoma, margins negative to.  Negative visceral pleural invasion and lymphovascular invasion.  0/18 lymph nodes involved.  Nodal stations examined include 4, 7, 8, 9, 10, 11, 12.  PT2b pN0. - 4 cycles of docetaxel  and carboplatin  from 07/26/2022 through 09/28/2022. - NGS: PD-L1 TPS 30%, TMB-high.  Negative for  ALK/EGFR/BRAF/KRAS/MET/RET/ROS1.  Positive for PIK3CA and T p53. - Adjuvant pembrolizumab  for 1 year started on 11/10/2022, discontinued after cycle 1.   2.  Social/family history: - Lives at home with her husband [name]. - She has been on disability since she was diagnosed with cervical cancer.  Prior to that she worked at a [employer].  No chemical exposure. - She is independent of ADLs and IADLs. - Current active smoker and started smoking 1 pack/day at age 41. - Maternal aunt had breast cancer.   3.  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 - XRT 30 Gray in 5 fractions to the vaginal cuff from 02/22/2017 through 03/22/2017.    Plan: 1.  Stage IIa (T2b N0) right lower lobe squamous cell carcinoma: - She is continuing to smokes half pack of cigarettes per day.  Reports some change in cough but with clear expectoration.  No hemoptysis reported. - Reviewed labs today: Normal LFTs.  Albumin  is slightly low.  CBC was normal. - Reviewed CT chest from 09/29/2023: New left lower lobe infiltrate.  No other significant changes.  Will follow-up on radiology report. - Will give a trial of Levaquin 500 mg daily for 10 days. - Will likely repeat CT scan in 2-3 months for resolution of the lesion.      No orders of the defined types were placed in this encounter.     [name],acting as a Neurosurgeon for Schroeder Rogers, MD.,have documented all relevant documentation on the behalf of Amber Kellenberger, MD,as directed by  Amber Burgo,  MD while in the presence of Amber Stands, MD.  I, Amber Stands MD, have reviewed the above documentation for accuracy and completeness, and I agree with the above.     Amber Stands, MD   6/26/20254:36 PM  CHIEF COMPLAINT:   Diagnosis: RLL lung cancer    Cancer Staging  Primary squamous cell carcinoma of lower lobe of  right lung (HCC) Staging form: Lung, AJCC 8th Edition - Clinical stage from 05/23/2022: Stage IIA (cT2b, cN0, cM0) - Unsigned    Prior Therapy: RLL lobectomy and LND, 06/14/22 (Dr. Kerrin)   Current Therapy:  Adjuvant chemotherapy with carboplatin  and docetaxel     HISTORY OF PRESENT ILLNESS:   Oncology History  Primary squamous cell carcinoma of lower lobe of right lung (HCC)  05/23/2022 Initial Diagnosis   Primary squamous cell carcinoma of lower lobe of right lung   07/26/2022 - 09/30/2022 Chemotherapy   Patient is on Treatment Plan : LUNG Carboplatin  (AUC 6) + Docetaxel  (75) q21d x 4 cycles     11/10/2022 -  Chemotherapy   Patient is on Treatment Plan : LUNG NSCLC Pembrolizumab  (200) q21d        INTERVAL HISTORY:   [name] is a 68 y.o. female presenting to the clinic today for follow-up of RLL lung cancer. She was last seen by me on 03/31/23.  Since her last visit, she underwent CT chest on 09/29/22.  Today, she states that she is doing well overall. Her appetite level is at 50%. Her energy level is at 25%. [name] has gained 11 pounds since her last visit with me. She reports a slightly worsening cough with white sputum, but denies any recent infections. She denies any antibiotic allergies.  PAST MEDICAL HISTORY:   Past Medical History: Past Medical History:  Diagnosis Date   Anxiety    Arthritis    spinal stenosis   Back disorder    crooked spine   Bulging lumbar disc    Cervical cancer (HCC)    Complication of anesthesia    ? hypotension 10/2016 at [facility] surgery    Depression    Dyspnea    H/O degenerative disc disease    History of radiation therapy 02/22/17-03/22/17   vaginal cuff 30 Gy in 5 fractions   Hypertension    Hypothyroidism    patient taken off of hypothyroid med in 11/2016    Osteoporosis    Thyroid  disease     Surgical History: Past Surgical History:  Procedure Laterality Date   ABDOMINAL EXPOSURE N/A 09/27/2018   Procedure:  ABDOMINAL EXPOSURE;  Surgeon: Serene Gaile ORN, MD;  Location: [facility] OR;  Service: Vascular;  Laterality: N/A;   ABDOMINAL HYSTERECTOMY     ANTERIOR AND POSTERIOR REPAIR N/A 11/09/2016   Procedure: ANTERIOR (CYSTOCELE) AND POSTERIOR REPAIR (RECTOCELE);  Surgeon: Edsel Norleen GAILS, MD;  Location: [facility] ORS;  Service: Gynecology;  Laterality: N/A;   ANTERIOR LUMBAR FUSION Left 09/27/2018   Procedure: ANTERIOR LUMBAR FUSION L5-S1,;  Surgeon: Amber Aures, MD;  Location: [facility] OR;  Service: Orthopedics;  Laterality: Left;  4.5 HRS/ DR. SERENE ASSIST   BACK SURGERY     BILATERAL SALPINGECTOMY Left 11/09/2016   Procedure: LEFT SALPINGECTOMY;  Surgeon: Edsel Norleen GAILS, MD;  Location: [facility] ORS;  Service: Gynecology;  Laterality: Left;   BRONCHIAL BIOPSY  05/13/2022   Procedure: BRONCHIAL BIOPSIES;  Surgeon: [name] Adine CROME, DO;  Location: [facility] ENDOSCOPY;  Service: Pulmonary;;   BRONCHIAL BRUSHINGS  05/13/2022   Procedure: BRONCHIAL BRUSHINGS;  Surgeon: [name] Adine CROME, DO;  Location: [facility];  Service: Pulmonary;;   BRONCHIAL NEEDLE ASPIRATION BIOPSY  05/13/2022   Procedure: BRONCHIAL NEEDLE ASPIRATION BIOPSIES;  Surgeon: [name] Adine CROME, DO;  Location: [facility];  Service: Pulmonary;;   CATARACT EXTRACTION W/PHACO Right 07/05/2016   Procedure: CATARACT EXTRACTION PHACO AND INTRAOCULAR LENS PLACEMENT (IOC);  Surgeon: Cherene Mania, MD;  Location: [facility];  Service: Ophthalmology;  Laterality: Right;  CDE: 15.41   CATARACT EXTRACTION W/PHACO Left 07/19/2016   Procedure: CATARACT EXTRACTION PHACO AND INTRAOCULAR LENS PLACEMENT LEFT EYE CDE= 12.65;  Surgeon: Cherene Mania, MD;  Location: [facility];  Service: Ophthalmology;  Laterality: Left;  left   COLONOSCOPY WITH PROPOFOL  N/A 09/09/2021   Procedure: COLONOSCOPY WITH PROPOFOL ;  Surgeon: Shaaron Lamar HERO, MD;  Location: [facility];  Service: Endoscopy;  Laterality: N/A;  10:30am   EYE SURGERY     FINE NEEDLE ASPIRATION  05/13/2022   Procedure: FINE NEEDLE ASPIRATION  (FNA) LINEAR;  Surgeon: [name] Adine CROME, DO;  Location: [facility];  Service: Pulmonary;;   INTERCOSTAL NERVE BLOCK  06/14/2022   Procedure: INTERCOSTAL NERVE BLOCK;  Surgeon: [name] Elspeth BROCKS, MD;  Location: [facility] OR;  Service: Thoracic;;   IR IMAGING GUIDED PORT INSERTION  07/21/2022   LUMBAR LAMINECTOMY/DECOMPRESSION MICRODISCECTOMY Left 09/27/2018   Procedure: L4-L5 Lumbar Laminectomy/Decompression;  Surgeon: Amber Aures, MD;  Location: [facility] OR;  Service: Orthopedics;  Laterality: Left;   NODE DISSECTION Right 06/14/2022   Procedure: NODE DISSECTION;  Surgeon: [name] Elspeth BROCKS, MD;  Location[facility] OR;  Service: Thoracic;  Laterality: Right;   POLYPECTOMY  09/09/2021   Procedure: POLYPECTOMY;  Surgeon: Shaaron Lamar HERO, MD;  Location: [facility];  Service: Endoscopy;;   ROBOTIC PELVIC AND PARA-AORTIC LYMPH NODE DISSECTION N/A 01/11/2017   Procedure: XI ROBOTIC BILATERAL PELVIC LYMPH NODE DISSECTION;  Surgeon: Eloy Herring, MD;  Location: [facility] ORS;  Service: Gynecology;  Laterality: N/A;   SALPINGOOPHORECTOMY Right 11/09/2016   Procedure: RIGHT SALPINGO OOPHORECTOMY;  Surgeon: Edsel Norleen GAILS, MD;  Location: [facility];  Service: Gynecology;  Laterality: Right;   TUBAL LIGATION     VAGINAL HYSTERECTOMY N/A 11/09/2016   Procedure: HYSTERECTOMY VAGINAL;  Surgeon: Edsel Norleen GAILS, MD;  Location: [facility];  Service: Gynecology;  Laterality: N/A;   VIDEO BRONCHOSCOPY WITH ENDOBRONCHIAL ULTRASOUND Right 05/13/2022   Procedure: VIDEO BRONCHOSCOPY WITH ENDOBRONCHIAL ULTRASOUND;  Surgeon: [name] Adine CROME, DO;  Location: [facility];  Service: Pulmonary;  Laterality: Right;    Social History: Social History   Socioeconomic History   Marital status: Legally Separated    Spouse name: Not on file   Number of children: 1   Years of education: Not on file   Highest education level: Not on file  Occupational History   Not on file  Tobacco Use   Smoking status: Every Day    Current packs/day: 1.00     Average packs/day: 1 pack/day for 44.0 years (44.0 ttl pk-yrs)    Types: Cigarettes   Smokeless tobacco: Never   Tobacco comments:    Smokes 1/2 ppd per pt. 08/10/22  Vaping Use   Vaping status: Never Used  Substance and Sexual Activity   Alcohol use: No    Comment: 01-06-2016 Per pt rarely, 02-05-2016 per pt no but 38yrs ago     Drug use: No    Comment: 02-05-2016 per pt no but about 40 yrs ago   Sexual activity: Not Currently    Birth control/protection: Surgical    Comment:  hyst  Other Topics Concern   Not on file  Social History Narrative   Not on file   Social Drivers of Health   Financial Resource Strain: Unknown (01/20/2021)   Overall Financial Resource Strain (CARDIA)    Difficulty of Paying Living Expenses: Patient declined  Food Insecurity: No Food Insecurity (05/04/2022)   Hunger Vital Sign    Worried About Running Out of Food in the Last Year: Never true    Ran Out of Food in the Last Year: Never true  Transportation Needs: No Transportation Needs (05/04/2022)   PRAPARE - Administrator, Civil Service (Medical): No    Lack of Transportation (Non-Medical): No  Physical Activity: Inactive (01/20/2021)   Exercise Vital Sign    Days of Exercise per Week: 1 day    Minutes of Exercise per Session: 0 min  Stress: Stress Concern Present (01/20/2021)   [employer] of Occupational Health - Occupational Stress Questionnaire    Feeling of Stress : Very much  Social Connections: Moderately Isolated (01/20/2021)   Social Connection and Isolation Panel    Frequency of Communication with Friends and Family: Twice a week    Frequency of Social Gatherings with Friends and Family: Never    Attends Religious Services: More than 4 times per year    Active Member of [employer] or Organizations: No    Attends Banker Meetings: Never    Marital Status: Married  Catering manager Violence: Not At Risk (05/04/2022)   Humiliation, Afraid, Rape, and Kick  questionnaire    Fear of Current or Ex-Partner: No    Emotionally Abused: No    Physically Abused: No    Sexually Abused: No    Family History: Family History  Problem Relation Age of Onset   Hypertension Mother    Congenital heart disease Mother    Diabetes Mother    Thyroid  disease Brother    Other Daughter        bowel issues   Colon cancer Neg Hx    Colon polyps Neg Hx     Current Medications:  Current Outpatient Medications:    acetaminophen  (TYLENOL ) 325 MG tablet, Take 650 mg by mouth every 6 (six) hours as needed for headache., Disp: , Rfl:    albuterol  (VENTOLIN  HFA) 108 (90 Base) MCG/ACT inhaler, INHALE 2 PUFFS INTO THE LUNGS EVERY 6 HOURS AS NEEDED FOR WHEEZING OR SHORTNESS OF BREATH., Disp: 8.5 g, Rfl: 2   alendronate (FOSAMAX) 70 MG tablet, Take 70 mg by mouth once a week., Disp: , Rfl:    ALPRAZolam  (XANAX ) 1 MG tablet, Take 1 mg by mouth at bedtime. May take additional 1 mg during the day if needed, Disp: , Rfl:    atorvastatin  (LIPITOR) 40 MG tablet, Take 40 mg by mouth at bedtime. , Disp: , Rfl:    benzonatate  (TESSALON ) 100 MG capsule, Take 100 mg by mouth 3 (three) times daily as needed for cough., Disp: , Rfl:    FLUoxetine  (PROZAC ) 20 MG capsule, Take 60 mg by mouth every morning., Disp: , Rfl:    FLUoxetine  (PROZAC ) 40 MG capsule, Take 40 mg by mouth daily., Disp: , Rfl:    Fluticasone -Umeclidin-Vilant (TRELEGY ELLIPTA ) 100-62.5-25 MCG/ACT AEPB, Inhale 1 each into the lungs daily., Disp: 60 each, Rfl: 2   hydrochlorothiazide  (HYDRODIURIL ) 25 MG tablet, Take 1 tablet (25 mg total) by mouth daily., Disp: 90 tablet, Rfl: 3   levofloxacin (LEVAQUIN) 500 MG tablet, Take 1 tablet (500 mg total)  by mouth daily., Disp: 10 tablet, Rfl: 0   lidocaine -prilocaine  (EMLA ) cream, Apply a quarter-sized amount to port a cath site and cover with plastic wrap one hour prior to infusion appointments, Disp: 30 g, Rfl: 3   traZODone  (DESYREL ) 100 MG tablet, Take 100 mg by mouth at  bedtime., Disp: , Rfl:    Allergies: Allergies  Allergen Reactions   Zithromax [Azithromycin] Rash and Other (See Comments)    Blisters in mouth    Prednisone  Nausea Only    Sick to stomach Can take in low dose    REVIEW OF SYSTEMS:   Review of Systems  Constitutional:  Negative for chills, fatigue and fever.  HENT:   Negative for lump/mass, mouth sores, nosebleeds, sore throat and trouble swallowing.   Eyes:  Negative for eye problems.  Respiratory:  Positive for cough. Negative for shortness of breath.   Cardiovascular:  Negative for chest pain, leg swelling and palpitations.  Gastrointestinal:  Positive for constipation. Negative for abdominal pain, diarrhea, nausea and vomiting.  Genitourinary:  Negative for bladder incontinence, difficulty urinating, dysuria, frequency, hematuria and nocturia.   Musculoskeletal:  Negative for arthralgias, back pain, flank pain, myalgias and neck pain.  Skin:  Negative for itching and rash.  Neurological:  Positive for dizziness and headaches. Negative for numbness.  Hematological:  Does not bruise/bleed easily.  Psychiatric/Behavioral:  Positive for depression. Negative for sleep disturbance and suicidal ideas. The patient is nervous/anxious.   All other systems reviewed and are negative.    VITALS:   Blood pressure 115/77, pulse (!) 101, temperature 98.2 F (36.8 C), temperature source Oral, resp. rate 20, weight 121 lb 4.1 oz (55 kg), SpO2 94%.  Wt Readings from Last 3 Encounters:  10/06/23 121 lb 4.1 oz (55 kg)  03/31/23 110 lb (49.9 kg)  11/19/22 118 lb (53.5 kg)    Body mass index is 19.57 kg/m.  Performance status (ECOG): 1 - Symptomatic but completely ambulatory  PHYSICAL EXAM:   Physical Exam Vitals and nursing note reviewed. Exam conducted with a chaperone present.  Constitutional:      Appearance: Normal appearance.   Cardiovascular:     Rate and Rhythm: Normal rate and regular rhythm.     Pulses: Normal pulses.      Heart sounds: Normal heart sounds.  Pulmonary:     Effort: Pulmonary effort is normal.     Breath sounds: Normal breath sounds.  Abdominal:     Palpations: Abdomen is soft. There is no hepatomegaly, splenomegaly or mass.     Tenderness: There is no abdominal tenderness.   Musculoskeletal:     Right lower leg: No edema.     Left lower leg: No edema.  Lymphadenopathy:     Cervical: No cervical adenopathy.     Right cervical: No superficial, deep or posterior cervical adenopathy.    Left cervical: No superficial, deep or posterior cervical adenopathy.     Upper Body:     Right upper body: No supraclavicular or axillary adenopathy.     Left upper body: No supraclavicular or axillary adenopathy.   Neurological:     General: No focal deficit present.     Mental Status: She is alert and oriented to person, place, and time.   Psychiatric:        Mood and Affect: Mood normal.        Behavior: Behavior normal.     LABS:      Latest Ref Rng & Units 09/29/2023  1:12 PM 03/24/2023    2:08 PM 11/19/2022    6:01 PM  CBC  WBC 4.0 - 10.5 K/uL 9.1  10.6  11.9   Hemoglobin 12.0 - 15.0 g/dL 87.9  87.6  89.4   Hematocrit 36.0 - 46.0 % 38.0  37.7  32.9   Platelets 150 - 400 K/uL 288  253  206       Latest Ref Rng & Units 09/29/2023    1:12 PM 03/24/2023    2:06 PM 11/19/2022    6:01 PM  CMP  Glucose 70 - 99 mg/dL 86  95  890   BUN 8 - 23 mg/dL 10  14  18    Creatinine 0.44 - 1.00 mg/dL 9.05  8.99  8.92   Sodium 135 - 145 mmol/L 136  135  134   Potassium 3.5 - 5.1 mmol/L 3.8  3.6  3.7   Chloride 98 - 111 mmol/L 103  101  100   CO2 22 - 32 mmol/L 23  24  25    Calcium  8.9 - 10.3 mg/dL 9.0  8.9  8.8   Total Protein 6.5 - 8.1 g/dL 6.9  6.9  6.5   Total Bilirubin 0.0 - 1.2 mg/dL 0.4  0.2  0.6   Alkaline Phos 38 - 126 U/L 85  76  74   AST 15 - 41 U/L 17  17  31    ALT 0 - 44 U/L 14  12  19       No results found for: CEA1, CEA / No results found for: CEA1, CEA No results found  for: PSA1 No results found for: CAN199 No results found for: CAN125  No results found for: TOTALPROTELP, ALBUMINELP, A1GS, A2GS, BETS, BETA2SER, GAMS, MSPIKE, SPEI No results found for: TIBC, FERRITIN, IRONPCTSAT No results found for: LDH   STUDIES:   No results found.

## 2023-10-06 NOTE — Patient Instructions (Signed)
 [facility] Discharge Instructions   You were seen and examined today by Dr. Rogers.  He reviewed the results of your lab work which are normal/stable.   He reviewed the images of your CT scan. There is a new spot on the left lung - it could be inflammation or infection. We will give you a trial of antibiotics to see if this clears up the spot.   We will see you back based on what the final CT report is showing.   Return as scheduled.    Thank you for choosing  [facility] to provide your oncology and hematology care.  To afford each patient quality time with our provider, please arrive at least 15 minutes before your scheduled appointment time.   If you have a lab appointment with the Cancer Center please come in thru the Main Entrance and check in at the main information desk.  You need to re-schedule your appointment should you arrive 10 or more minutes late.  We strive to give you quality time with our providers, and arriving late affects you and other patients whose appointments are after yours.  Also, if you no show three or more times for appointments you may be dismissed from the clinic at the providers discretion.     Again, thank you for choosing [facility].  Our hope is that these requests will decrease the amount of time that you wait before being seen by our physicians.       _____________________________________________________________  Should you have questions after your visit to [facility], please contact our office at [phone] and follow the prompts.  Our office hours are 8:00 a.m. and 4:30 p.m. Monday - Friday.  Please note that voicemails left after 4:00 p.m. may not be returned until the following business day.  We are closed weekends and major holidays.  You do have access to a nurse 24-7, just call the main number to the clinic [phone] and do not press any  options, hold on the line and a nurse will answer the phone.    For prescription refill requests, have your pharmacy contact our office and allow 72 hours.    Due to Covid, you will need to wear a mask upon entering the hospital. If you do not have a mask, a mask will be given to you at the Main Entrance upon arrival. For doctor visits, patients may have 1 support person age 62 or older with them. For treatment visits, patients can not have anyone with them due to social distancing guidelines and our immunocompromised population.

## 2023-10-10 ENCOUNTER — Other Ambulatory Visit: Payer: Self-pay | Admitting: *Deleted

## 2023-10-10 DIAGNOSIS — C3431 Malignant neoplasm of lower lobe, right bronchus or lung: Secondary | ICD-10-CM

## 2023-10-11 ENCOUNTER — Other Ambulatory Visit: Payer: Self-pay

## 2023-11-10 ENCOUNTER — Emergency Department (HOSPITAL_COMMUNITY)

## 2023-11-10 ENCOUNTER — Encounter (HOSPITAL_COMMUNITY): Payer: Self-pay

## 2023-11-10 ENCOUNTER — Observation Stay (HOSPITAL_COMMUNITY)
Admission: EM | Admit: 2023-11-10 | Discharge: 2023-11-11 | Disposition: A | Discharge status: Home / Self Care | Attending: Family Medicine | Admitting: Family Medicine

## 2023-11-10 DIAGNOSIS — I10 Essential (primary) hypertension: Secondary | ICD-10-CM | POA: Insufficient documentation

## 2023-11-10 DIAGNOSIS — J441 Chronic obstructive pulmonary disease with (acute) exacerbation: Secondary | ICD-10-CM | POA: Diagnosis not present

## 2023-11-10 DIAGNOSIS — G4700 Insomnia, unspecified: Secondary | ICD-10-CM | POA: Diagnosis not present

## 2023-11-10 DIAGNOSIS — R0602 Shortness of breath: Secondary | ICD-10-CM | POA: Diagnosis present

## 2023-11-10 DIAGNOSIS — R0902 Hypoxemia: Secondary | ICD-10-CM

## 2023-11-10 DIAGNOSIS — J9601 Acute respiratory failure with hypoxia: Secondary | ICD-10-CM | POA: Insufficient documentation

## 2023-11-10 DIAGNOSIS — F419 Anxiety disorder, unspecified: Secondary | ICD-10-CM | POA: Diagnosis not present

## 2023-11-10 DIAGNOSIS — F17210 Nicotine dependence, cigarettes, uncomplicated: Secondary | ICD-10-CM | POA: Insufficient documentation

## 2023-11-10 DIAGNOSIS — C3431 Malignant neoplasm of lower lobe, right bronchus or lung: Secondary | ICD-10-CM | POA: Diagnosis not present

## 2023-11-10 DIAGNOSIS — E785 Hyperlipidemia, unspecified: Secondary | ICD-10-CM | POA: Diagnosis not present

## 2023-11-10 DIAGNOSIS — F329 Major depressive disorder, single episode, unspecified: Secondary | ICD-10-CM | POA: Insufficient documentation

## 2023-11-10 LAB — COMPREHENSIVE METABOLIC PANEL WITH GFR
ALT: 18 U/L (ref 0–44)
AST: 21 U/L (ref 15–41)
Albumin: 3.8 g/dL (ref 3.5–5.0)
Alkaline Phosphatase: 113 U/L (ref 38–126)
Anion gap: 15 (ref 5–15)
BUN: 13 mg/dL (ref 8–23)
CO2: 22 mmol/L (ref 22–32)
Calcium: 9.5 mg/dL (ref 8.9–10.3)
Chloride: 97 mmol/L — ABNORMAL LOW (ref 98–111)
Creatinine, Ser: 0.99 mg/dL (ref 0.44–1.00)
GFR, Estimated: >60 mL/min (ref >60)
Glucose, Bld: 102 mg/dL — ABNORMAL HIGH (ref 70–99)
Potassium: 4.2 mmol/L (ref 3.5–5.1)
Sodium: 134 mmol/L — ABNORMAL LOW (ref 135–145)
Total Bilirubin: 0.7 mg/dL (ref 0.0–1.2)
Total Protein: 8 g/dL (ref 6.5–8.1)

## 2023-11-10 LAB — CBC WITH DIFFERENTIAL/PLATELET
Abs Immature Granulocytes: 0.06 K/uL (ref 0.00–0.07)
Basophils Absolute: 0.1 K/uL (ref 0.0–0.1)
Basophils Relative: 1 %
Eosinophils Absolute: 0.2 K/uL (ref 0.0–0.5)
Eosinophils Relative: 1 %
HCT: 44.8 % (ref 36.0–46.0)
Hemoglobin: 14.6 g/dL (ref 12.0–15.0)
Immature Granulocytes: 1 %
Lymphocytes Relative: 14 %
Lymphs Abs: 1.8 K/uL (ref 0.7–4.0)
MCH: 29.1 pg (ref 26.0–34.0)
MCHC: 32.6 g/dL (ref 30.0–36.0)
MCV: 89.2 fL (ref 80.0–100.0)
Monocytes Absolute: 0.6 K/uL (ref 0.1–1.0)
Monocytes Relative: 5 %
Neutro Abs: 10.1 K/uL — ABNORMAL HIGH (ref 1.7–7.7)
Neutrophils Relative %: 78 %
Platelets: 278 K/uL (ref 150–400)
RBC: 5.02 MIL/uL (ref 3.87–5.11)
RDW: 14.2 % (ref 11.5–15.5)
WBC: 12.9 K/uL — ABNORMAL HIGH (ref 4.0–10.5)
nRBC: 0 % (ref 0.0–0.2)

## 2023-11-10 LAB — BRAIN NATRIURETIC PEPTIDE: B Natriuretic Peptide: 89 pg/mL (ref 0.0–100.0)

## 2023-11-10 LAB — D-DIMER, QUANTITATIVE: D-Dimer, Quant: 1.38 ug{FEU}/mL — ABNORMAL HIGH (ref 0.00–0.50)

## 2023-11-10 MED ORDER — ONDANSETRON HCL 4 MG PO TABS: 4.0000 mg | ORAL_TABLET | Freq: Four times a day (QID) | ORAL | Status: DC | PRN

## 2023-11-10 MED ORDER — LISINOPRIL 10 MG PO TABS
10.0000 mg | ORAL_TABLET | Freq: Every day | ORAL | Status: DC
  Administered 2023-11-11: 10 mg via ORAL
  Filled 2023-11-10: qty 1

## 2023-11-10 MED ORDER — ONDANSETRON HCL 4 MG/2ML IJ SOLN
4.0000 mg | Freq: Once | INTRAMUSCULAR | Status: AC
  Administered 2023-11-10: 4 mg via INTRAVENOUS
  Filled 2023-11-10: qty 2

## 2023-11-10 MED ORDER — IOHEXOL 350 MG/ML SOLN
75.0000 mL | Freq: Once | INTRAVENOUS | Status: AC | PRN
Start: 2023-11-10 — End: 2023-11-10
  Administered 2023-11-10: 75 mL via INTRAVENOUS

## 2023-11-10 MED ORDER — ENOXAPARIN SODIUM 40 MG/0.4ML IJ SOSY
40.0000 mg | PREFILLED_SYRINGE | INTRAMUSCULAR | Status: DC
  Administered 2023-11-10: 40 mg via SUBCUTANEOUS
  Filled 2023-11-10: qty 0.4

## 2023-11-10 MED ORDER — METHYLPREDNISOLONE SODIUM SUCC 125 MG IJ SOLR
125.0000 mg | Freq: Two times a day (BID) | INTRAMUSCULAR | Status: DC
  Administered 2023-11-11: 125 mg via INTRAVENOUS
  Filled 2023-11-10: qty 2

## 2023-11-10 MED ORDER — FLUOXETINE HCL 20 MG PO CAPS
60.0000 mg | ORAL_CAPSULE | Freq: Every morning | ORAL | Status: DC
  Administered 2023-11-11: 60 mg via ORAL
  Filled 2023-11-10: qty 3

## 2023-11-10 MED ORDER — ACETAMINOPHEN 325 MG PO TABS
650.0000 mg | ORAL_TABLET | Freq: Four times a day (QID) | ORAL | Status: DC | PRN
Start: 2023-11-10 — End: 2023-11-11

## 2023-11-10 MED ORDER — ONDANSETRON HCL 4 MG/2ML IJ SOLN: 4.0000 mg | Freq: Four times a day (QID) | INTRAMUSCULAR | Status: DC | PRN

## 2023-11-10 MED ORDER — LEVOFLOXACIN IN D5W 500 MG/100ML IV SOLN
500.0000 mg | Freq: Once | INTRAVENOUS | Status: AC
  Administered 2023-11-10: 500 mg via INTRAVENOUS
  Filled 2023-11-10: qty 100

## 2023-11-10 MED ORDER — TRAZODONE HCL 50 MG PO TABS
100.0000 mg | ORAL_TABLET | Freq: Every day | ORAL | Status: DC
  Administered 2023-11-10: 100 mg via ORAL
  Filled 2023-11-10: qty 2

## 2023-11-10 MED ORDER — METHYLPREDNISOLONE SODIUM SUCC 125 MG IJ SOLR
125.0000 mg | Freq: Once | INTRAMUSCULAR | Status: AC
  Administered 2023-11-10: 125 mg via INTRAVENOUS
  Filled 2023-11-10: qty 2

## 2023-11-10 MED ORDER — IPRATROPIUM-ALBUTEROL 0.5-2.5 (3) MG/3ML IN SOLN: 3.0000 mL | RESPIRATORY_TRACT | Status: DC | PRN

## 2023-11-10 MED ORDER — SENNOSIDES-DOCUSATE SODIUM 8.6-50 MG PO TABS: 1.0000 | ORAL_TABLET | Freq: Every evening | ORAL | Status: DC | PRN

## 2023-11-10 MED ORDER — ACETAMINOPHEN 650 MG RE SUPP: 650.0000 mg | Freq: Four times a day (QID) | RECTAL | Status: DC | PRN

## 2023-11-10 MED ORDER — IPRATROPIUM-ALBUTEROL 0.5-2.5 (3) MG/3ML IN SOLN
3.0000 mL | Freq: Once | RESPIRATORY_TRACT | Status: AC
  Administered 2023-11-10: 3 mL via RESPIRATORY_TRACT
  Filled 2023-11-10: qty 3

## 2023-11-10 MED ORDER — PREDNISONE 20 MG PO TABS: 40.0000 mg | ORAL_TABLET | Freq: Every day | ORAL | Status: DC

## 2023-11-10 MED ORDER — SODIUM CHLORIDE 0.9 % IV SOLN: 1.0000 g | INTRAVENOUS | Status: DC

## 2023-11-10 MED ORDER — SODIUM CHLORIDE 0.9% FLUSH
3.0000 mL | Freq: Two times a day (BID) | INTRAVENOUS | Status: DC
  Administered 2023-11-10 – 2023-11-11 (×2): 3 mL via INTRAVENOUS

## 2023-11-10 MED ORDER — ALPRAZOLAM 1 MG PO TABS
1.0000 mg | ORAL_TABLET | Freq: Every day | ORAL | Status: DC
  Administered 2023-11-10: 1 mg via ORAL
  Filled 2023-11-10: qty 1

## 2023-11-10 MED ORDER — ATORVASTATIN CALCIUM 40 MG PO TABS
40.0000 mg | ORAL_TABLET | Freq: Every day | ORAL | Status: DC
  Administered 2023-11-10: 40 mg via ORAL
  Filled 2023-11-10: qty 1

## 2023-11-10 NOTE — H&P (Signed)
 History and Physical    [name] FMW:[id number] DOB: 07-12-1955 DOA: 11/10/2023  PCP: Luke Agent, MD (Inactive)   Patient coming from: Home   Chief Complaint: SOb, cough, hypoxia  HPI: [name] is a 68 y.o. female with medical history significant for hypertension, hyperlipidemia, anxiety, emphysema, and squamous cell carcinoma of the right lung who presents with increased shortness of breath, cough, and low oxygen  saturations at home.  Patient reports worsening shortness of breath and cough productive of thick white sputum.  She notes that her pulse oximetry consistently shows saturation in the low to mid 80s.  She denies any fevers, chills, or chest pain.  ED Course: Upon arrival to the ED, patient is found to be afebrile and saturating as low as 77% on room air with tachypnea, normal HR, and stable BP.  Labs are most notable for WBC 12,900 and D-dimer 1.38.  CTA is negative for PE but notable for airway thickening, left base atelectasis, trace right pleural effusion, nodules in the right upper lobe, and emphysematous changes.  She was treated in the ED with supplemental oxygen , IV Solu-Medrol , Levaquin , Zofran , and DuoNeb.  Review of Systems:  All other systems reviewed and apart from HPI, are negative.  Past Medical History:  Diagnosis Date   Anxiety    Arthritis    spinal stenosis   Back disorder    crooked spine   Bulging lumbar disc    Cervical cancer (HCC)    Complication of anesthesia    ? hypotension 10/2016 at [facility] surgery    Depression    Dyspnea    H/O degenerative disc disease    History of radiation therapy 02/22/17-03/22/17   vaginal cuff 30 Gy in 5 fractions   Hypertension    Hypothyroidism    patient taken off of hypothyroid med in 11/2016    Osteoporosis    Thyroid  disease     Past Surgical History:  Procedure Laterality Date   ABDOMINAL EXPOSURE N/A 09/27/2018   Procedure: ABDOMINAL EXPOSURE;  Surgeon: Serene Gaile ORN, MD;   Location: [facility] OR;  Service: Vascular;  Laterality: N/A;   ABDOMINAL HYSTERECTOMY     ANTERIOR AND POSTERIOR REPAIR N/A 11/09/2016   Procedure: ANTERIOR (CYSTOCELE) AND POSTERIOR REPAIR (RECTOCELE);  Surgeon: Edsel Norleen GAILS, MD;  Location: [facility] ORS;  Service: Gynecology;  Laterality: N/A;   ANTERIOR LUMBAR FUSION Left 09/27/2018   Procedure: ANTERIOR LUMBAR FUSION L5-S1,;  Surgeon: Burnetta Aures, MD;  Location: [facility] OR;  Service: Orthopedics;  Laterality: Left;  4.5 HRS/ DR. SERENE ASSIST   BACK SURGERY     BILATERAL SALPINGECTOMY Left 11/09/2016   Procedure: LEFT SALPINGECTOMY;  Surgeon: Edsel Norleen GAILS, MD;  Location: [facility] ORS;  Service: Gynecology;  Laterality: Left;   BRONCHIAL BIOPSY  05/13/2022   Procedure: BRONCHIAL BIOPSIES;  Surgeon: Brenna Adine CROME, DO;  Location: [facility] ENDOSCOPY;  Service: Pulmonary;;   BRONCHIAL BRUSHINGS  05/13/2022   Procedure: BRONCHIAL BRUSHINGS;  Surgeon: Brenna Adine CROME, DO;  Location: [facility];  Service: Pulmonary;;   BRONCHIAL NEEDLE ASPIRATION BIOPSY  05/13/2022   Procedure: BRONCHIAL NEEDLE ASPIRATION BIOPSIES;  Surgeon: Brenna Adine CROME, DO;  Location: [facility];  Service: Pulmonary;;   CATARACT EXTRACTION W/PHACO Right 07/05/2016   Procedure: CATARACT EXTRACTION PHACO AND INTRAOCULAR LENS PLACEMENT (IOC);  Surgeon: Cherene Mania, MD;  Location: [facility]S;  Service: Ophthalmology;  Laterality: Right;  CDE: 15.41   CATARACT EXTRACTION W/PHACO Left 07/19/2016   Procedure: CATARACT EXTRACTION PHACO AND INTRAOCULAR LENS  PLACEMENT LEFT EYE CDE= 12.65;  Surgeon: Cherene Mania, MD;  Location: [facility];  Service: Ophthalmology;  Laterality: Left;  left   COLONOSCOPY WITH PROPOFOL  N/A 09/09/2021   Procedure: COLONOSCOPY WITH PROPOFOL ;  Surgeon: Shaaron Lamar HERO, MD;  Location: [facility];  Service: Endoscopy;  Laterality: N/A;  10:30am   EYE SURGERY     FINE NEEDLE ASPIRATION  05/13/2022   Procedure: FINE NEEDLE ASPIRATION (FNA) LINEAR;  Surgeon: Brenna Adine CROME, DO;   Location: [facility];  Service: Pulmonary;;   INTERCOSTAL NERVE BLOCK  06/14/2022   Procedure: INTERCOSTAL NERVE BLOCK;  Surgeon: Kerrin Elspeth BROCKS, MD;  Location: [facility] OR;  Service: Thoracic;;   IR IMAGING GUIDED PORT INSERTION  07/21/2022   LUMBAR LAMINECTOMY/DECOMPRESSION MICRODISCECTOMY Left 09/27/2018   Procedure: L4-L5 Lumbar Laminectomy/Decompression;  Surgeon: Burnetta Aures, MD;  Location: [facility] OR;  Service: Orthopedics;  Laterality: Left;   NODE DISSECTION Right 06/14/2022   Procedure: NODE DISSECTION;  Surgeon: Kerrin Elspeth BROCKS, MD;  Location: [facility] OR;  Service: Thoracic;  Laterality: Right;   POLYPECTOMY  09/09/2021   Procedure: POLYPECTOMY;  Surgeon: Shaaron Lamar HERO, MD;  [facility]: [facility];  Service: Endoscopy;;   ROBOTIC PELVIC AND PARA-AORTIC LYMPH NODE DISSECTION N/A 01/11/2017   Procedure: XI ROBOTIC BILATERAL PELVIC LYMPH NODE DISSECTION;  Surgeon: Eloy Herring, MD;  Location: [facility] ORS;  Service: Gynecology;  Laterality: N/A;   SALPINGOOPHORECTOMY Right 11/09/2016   Procedure: RIGHT SALPINGO OOPHORECTOMY;  Surgeon: Edsel Norleen GAILS, MD;  Location: [facility];  Service: Gynecology;  Laterality: Right;   TUBAL LIGATION     VAGINAL HYSTERECTOMY N/A 11/09/2016   Procedure: HYSTERECTOMY VAGINAL;  Surgeon: Edsel Norleen GAILS, MD;  Location: [facility];  Service: Gynecology;  Laterality: N/A;   VIDEO BRONCHOSCOPY WITH ENDOBRONCHIAL ULTRASOUND Right 05/13/2022   Procedure: VIDEO BRONCHOSCOPY WITH ENDOBRONCHIAL ULTRASOUND;  Surgeon: Brenna Adine CROME, DO;  Location: [facility];  Service: Pulmonary;  Laterality: Right;    Social History:   reports that she has been smoking cigarettes. She has a 44 pack-year smoking history. She has never used smokeless tobacco. She reports that she does not drink alcohol and does not use drugs.  Allergies  Allergen Reactions   Zithromax [Azithromycin] Rash and Other (See Comments)    Blisters in mouth    Prednisone  Nausea Only    Sick to stomach Can  take in low dose    Family History  Problem Relation Age of Onset   Hypertension Mother    Congenital heart disease Mother    Diabetes Mother    Thyroid  disease Brother    Other Daughter        bowel issues   Colon cancer Neg Hx    Colon polyps Neg Hx      Prior to Admission medications   Medication Sig Start Date End Date Taking? Authorizing Provider  acetaminophen  (TYLENOL ) 325 MG tablet Take 650 mg by mouth every 6 (six) hours as needed for headache.   Yes [provider]  albuterol  (VENTOLIN  HFA) 108 (90 Base) MCG/ACT inhaler INHALE 2 PUFFS INTO THE LUNGS EVERY 6 HOURS AS NEEDED FOR WHEEZING OR SHORTNESS OF BREATH. 05/05/22  Yes Icard, Bradley L, DO  alendronate (FOSAMAX) 70 MG tablet Take 70 mg by mouth once a week. 12/11/18  Yes [provider]  ALPRAZolam  (XANAX ) 1 MG tablet Take 1 mg by mouth at bedtime. May take additional 1 mg during the day if needed 09/24/19  Yes [provider]  atorvastatin  (LIPITOR) 40  MG tablet Take 40 mg by mouth at bedtime.    Yes [provider]  benzonatate  (TESSALON ) 100 MG capsule Take 100 mg by mouth 3 (three) times daily as needed for cough.   Yes [provider]  FLUoxetine  (PROZAC ) 20 MG capsule Take 60 mg by mouth every morning. 08/16/23  Yes [provider]  lisinopril  (ZESTRIL ) 10 MG tablet Take 10 mg by mouth daily. 08/16/23  Yes [provider]  traZODone  (DESYREL ) 100 MG tablet Take 100 mg by mouth at bedtime. 11/17/19  Yes [provider]  Fluticasone -Umeclidin-Vilant (TRELEGY ELLIPTA ) 100-62.5-25 MCG/ACT AEPB Inhale 1 each into the lungs daily. Patient not taking: Reported on 11/10/2023 05/06/22   Brenna Cain L, DO  hydrochlorothiazide  (HYDRODIURIL ) 25 MG tablet Take 1 tablet (25 mg total) by mouth daily. Patient not taking: Reported on 11/10/2023 03/15/22 10/06/23  Mallipeddi, Diannah SQUIBB, MD  lidocaine -prilocaine  (EMLA ) cream Apply a quarter-sized amount to port a cath site  and cover with plastic wrap one hour prior to infusion appointments 07/22/22   Rogers Hai, MD    Physical Exam: Vitals:   11/10/23 1833 11/10/23 2000 11/10/23 2015 11/10/23 2100  BP:  (!) 107/56 123/87 125/71  Pulse:  79 84 85  Resp:  (!) 24 (!) 25 (!) 22  Temp: 97.8 F (36.6 C)     TempSrc: Oral     SpO2:  92% 90% 92%  Weight:      Height:        Constitutional: NAD, calm  Eyes: PERTLA, lids and conjunctivae normal ENMT: Mucous membranes are moist. Posterior pharynx clear of any exudate or lesions.   Neck: supple, no masses  Respiratory: Speaking full sentences. Diminished bilaterally with prolonged expiratory phase, end-expiratory wheeze.  Cardiovascular: S1 & S2 heard, regular rate and rhythm. No extremity edema.  Abdomen: No tenderness, soft. Bowel sounds active.  Musculoskeletal: no clubbing / cyanosis. No joint deformity upper and lower extremities.   Skin: no significant rashes, lesions, ulcers. Warm, dry, well-perfused. Neurologic: CN 2-12 grossly intact. Moving all extremities. Alert and oriented.  Psychiatric: Pleasant. Cooperative.    Labs and Imaging on Admission: I have personally reviewed following labs and imaging studies  CBC: Recent Labs  Lab 11/10/23 1450  WBC 12.9*  NEUTROABS 10.1*  HGB 14.6  HCT 44.8  MCV 89.2  PLT 278   Basic Metabolic Panel: Recent Labs  Lab 11/10/23 1450  NA 134*  K 4.2  CL 97*  CO2 22  GLUCOSE 102*  BUN 13  CREATININE 0.99  CALCIUM  9.5   GFR: Estimated Creatinine Clearance: 45.9 mL/min (by C-G formula based on SCr of 0.99 mg/dL). Liver Function Tests: Recent Labs  Lab 11/10/23 1450  AST 21  ALT 18  ALKPHOS 113  BILITOT 0.7  PROT 8.0  ALBUMIN  3.8   No results for input(s): LIPASE, AMYLASE in the last 168 hours. No results for input(s): AMMONIA in the last 168 hours. Coagulation Profile: No results for input(s): INR, PROTIME in the last 168 hours. Cardiac Enzymes: No results for  input(s): CKTOTAL, CKMB, CKMBINDEX, TROPONINI in the last 168 hours. BNP (last 3 results) No results for input(s): PROBNP in the last 8760 hours. HbA1C: No results for input(s): HGBA1C in the last 72 hours. CBG: No results for input(s): GLUCAP in the last 168 hours. Lipid Profile: No results for input(s): CHOL, HDL, LDLCALC, TRIG, CHOLHDL, LDLDIRECT in the last 72 hours. Thyroid  Function Tests: No results for input(s): TSH, T4TOTAL, FREET4, T3FREE, THYROIDAB in the  last 72 hours. Anemia Panel: No results for input(s): VITAMINB12, FOLATE, FERRITIN, TIBC, IRON, RETICCTPCT in the last 72 hours. Urine analysis:    Component Value Date/Time   COLORURINE YELLOW 06/10/2022 1500   APPEARANCEUR HAZY (A) 06/10/2022 1500   LABSPEC 1.014 06/10/2022 1500   LABSPEC 1.005 02/22/2017 0917   PHURINE 6.0 06/10/2022 1500   GLUCOSEU NEGATIVE 06/10/2022 1500   GLUCOSEU Negative 02/22/2017 0917   HGBUR NEGATIVE 06/10/2022 1500   BILIRUBINUR NEGATIVE 06/10/2022 1500   BILIRUBINUR Negative 02/22/2017 0917   KETONESUR NEGATIVE 06/10/2022 1500   PROTEINUR 100 (A) 06/10/2022 1500   UROBILINOGEN 0.2 02/22/2017 0917   NITRITE NEGATIVE 06/10/2022 1500   LEUKOCYTESUR TRACE (A) 06/10/2022 1500   LEUKOCYTESUR Large 02/22/2017 0917   Sepsis Labs: @LABRCNTIP (procalcitonin:4,lacticidven:4) )No results found for this or any previous visit (from the past 240 hours).   Radiological Exams on Admission: CT Angio Chest PE W and/or Wo Contrast Result Date: 11/10/2023 CLINICAL DATA:  Pulmonary embolism (PE) suspected, high prob. Shortness of breath. EXAM: CT ANGIOGRAPHY CHEST WITH CONTRAST TECHNIQUE: Multidetector CT imaging of the chest was performed using the standard protocol during bolus administration of intravenous contrast. Multiplanar CT image reconstructions and MIPs were obtained to evaluate the vascular anatomy. RADIATION DOSE REDUCTION: This exam was performed  according to the departmental dose-optimization program which includes automated exposure control, adjustment of the mA and/or kV according to patient size and/or use of iterative reconstruction technique. CONTRAST:  75mL OMNIPAQUE  IOHEXOL  350 MG/ML SOLN COMPARISON:  09/29/2023 FINDINGS: Cardiovascular: No filling defects in the pulmonary arteries to suggest pulmonary emboli. Heart is normal size. Aorta is normal caliber. Scattered aortic atherosclerosis. Mediastinum/Nodes: No mediastinal, hilar, or axillary adenopathy. Trachea and esophagus are unremarkable. Thyroid  unremarkable. Lungs/Pleura: Mild centrilobular emphysema. Biapical scarring. Clustered nodules posteriorly in the right upper lobe, likely small airways disease. Airway thickening and mucous plugging in the left lower lobe and lingula. Airspace opacity in the left lower lobe likely reflects atelectasis. Trace right pleural effusion. Upper Abdomen: No acute findings Musculoskeletal: Chest wall soft tissues are unremarkable. No acute bony abnormality. Review of the MIP images confirms the above findings. IMPRESSION: No evidence of pulmonary embolus. Airway thickening in the left lower lobe and lingula. Airspace opacity in the left lower lobe likely reflects atelectasis. Trace right pleural effusion. Flustered nodules posteriorly in the right upper lobe likely reflect small airways disease. Aortic Atherosclerosis (ICD10-I70.0) and Emphysema (ICD10-J43.9). Electronically Signed   By: Franky Crease M.D.   On: 11/10/2023 18:24   DG Chest Port 1 View Result Date: 11/10/2023 CLINICAL DATA:  sob EXAM: PORTABLE CHEST - 1 VIEW COMPARISON:  November 19, 2022 FINDINGS: Similarly positioned right chest port terminating at the cavoatrial junction. Biapical pleural thickening. Chronic blunting of the right costophrenic sulcus, a combination of scarring and small loculated effusion. Emphysema. No new airspace consolidation or pleural effusion. No cardiomegaly. Aortic  atherosclerosis. No acute fracture or destructive lesions. Multilevel thoracic osteophytosis. IMPRESSION: No acute cardiopulmonary abnormality. Electronically Signed   By: Rogelia Myers M.D.   On: 11/10/2023 15:37    EKG: Independently reviewed. Sinus rhythm.   Assessment/Plan   1. COPD exacerbation; acute hypoxic respiratory failure  - Culture sputum, continue systemic steroid and antibiotics, continue short-acting bronchodilators, continue supplemental O2 as-needed    2. Lung cancer  - Continue follow-up with Dr. Katragadda as planned   3. Hypertension  - Lisinopril     4. HLD  - Lipitor   5. Anxiety, insomnia  - Continue Proxac, Xanax , trazodone   DVT prophylaxis: Lovenox   Code Status: Full Level of Care: Level of care: Telemetry Family Communication: none present  Disposition Plan:  Patient is from: home  Anticipated d/c is to: TBD Anticipated d/c date is: 11/12/23  Patient currently: Pending improved respiratory status, may need home O2  Consults called: None  Admission status: Inpatient     Evalene GORMAN Sprinkles, MD [facility]  11/10/2023, 9:52 PM

## 2023-11-10 NOTE — ED Notes (Signed)
 Patient transported to CT

## 2023-11-10 NOTE — ED Triage Notes (Signed)
 Pt comes in for SOB that has been going for a while. A ct in June showed nodules in left lung. Pt has a right lung lobectomy. Pt O2 stats was 77% on RA. Pt is supposed to have a new CT this month but pt feels she can't wait that long due to SOB. A&Ox4.

## 2023-11-10 NOTE — Plan of Care (Signed)

## 2023-11-11 ENCOUNTER — Other Ambulatory Visit: Payer: Self-pay

## 2023-11-11 DIAGNOSIS — J441 Chronic obstructive pulmonary disease with (acute) exacerbation: Secondary | ICD-10-CM | POA: Diagnosis not present

## 2023-11-11 LAB — BASIC METABOLIC PANEL WITH GFR
Anion gap: 11 (ref 5–15)
BUN: 14 mg/dL (ref 8–23)
CO2: 26 mmol/L (ref 22–32)
Calcium: 9.3 mg/dL (ref 8.9–10.3)
Chloride: 100 mmol/L (ref 98–111)
Creatinine, Ser: 0.87 mg/dL (ref 0.44–1.00)
GFR, Estimated: >60 mL/min (ref >60)
Glucose, Bld: 186 mg/dL — ABNORMAL HIGH (ref 70–99)
Potassium: 4.9 mmol/L (ref 3.5–5.1)
Sodium: 137 mmol/L (ref 135–145)

## 2023-11-11 LAB — CBC
HCT: 41.4 % (ref 36.0–46.0)
Hemoglobin: 13.5 g/dL (ref 12.0–15.0)
MCH: 29.3 pg (ref 26.0–34.0)
MCHC: 32.6 g/dL (ref 30.0–36.0)
MCV: 90 fL (ref 80.0–100.0)
Platelets: 259 K/uL (ref 150–400)
RBC: 4.6 MIL/uL (ref 3.87–5.11)
RDW: 14.2 % (ref 11.5–15.5)
WBC: 5.9 K/uL (ref 4.0–10.5)
nRBC: 0 % (ref 0.0–0.2)

## 2023-11-11 LAB — HIV ANTIBODY (ROUTINE TESTING W REFLEX): HIV Screen 4th Generation wRfx: NONREACTIVE

## 2023-11-11 MED ORDER — PREDNISONE 20 MG PO TABS: 40.0000 mg | ORAL_TABLET | Freq: Every day | ORAL | 0 refills | Status: DC

## 2023-11-11 MED ORDER — IPRATROPIUM-ALBUTEROL 0.5-2.5 (3) MG/3ML IN SOLN: 3.0000 mL | Freq: Four times a day (QID) | RESPIRATORY_TRACT | Status: DC | PRN

## 2023-11-11 MED ORDER — DOXYCYCLINE HYCLATE 100 MG PO TABS: 100.0000 mg | ORAL_TABLET | Freq: Two times a day (BID) | ORAL | 0 refills | Status: AC

## 2023-11-11 MED ORDER — IPRATROPIUM-ALBUTEROL 0.5-2.5 (3) MG/3ML IN SOLN
3.0000 mL | Freq: Once | RESPIRATORY_TRACT | Status: AC
  Administered 2023-11-11: 3 mL via RESPIRATORY_TRACT
  Filled 2023-11-11: qty 3

## 2023-11-11 NOTE — Care Management Obs Status (Signed)
 MEDICARE OBSERVATION STATUS NOTIFICATION   Patient Details  Name: [name] MRN: [id number] Date of Birth: 04-20-1955   Medicare Observation Status Notification Given:  Yes    Nena LITTIE Coffee, RN 11/11/2023, 4:02 PM

## 2023-11-11 NOTE — Care Management CC44 (Signed)
 Condition Code 44 Documentation Completed  Patient Details  Name: JOSSILYN BENDA MRN: [id number] Date of Birth: 1955-07-02   Condition Code 44 given:  Yes Patient signature on Condition Code 44 notice:  Yes Documentation of 2 MD's agreement:  Yes Code 44 added to claim:  Yes    Nena LITTIE Coffee, RN 11/11/2023, 4:04 PM

## 2023-11-11 NOTE — ED Provider Notes (Addendum)
 [facility] MEDICAL SURGICAL UNIT Provider Note   CSN: [id number] Arrival date & time: 11/10/23  1352     Patient presents with: Shortness of Breath   [name] is a 68 y.o. female.   Patient has a history of COPD.  She comes in complaining of shortness of breath with an O2 sat on room air in the 70s  The history is provided by the patient and medical records. No language interpreter was used.  Shortness of Breath Severity:  Moderate Onset quality:  Sudden Timing:  Constant Progression:  Worsening Chronicity:  Recurrent Context: activity   Relieved by:  Nothing Worsened by:  Nothing Ineffective treatments:  None tried Associated symptoms: no abdominal pain, no chest pain, no cough, no headaches and no rash        Prior to Admission medications   Medication Sig Start Date End Date Taking? Authorizing Provider  acetaminophen  (TYLENOL ) 325 MG tablet Take 650 mg by mouth every 6 (six) hours as needed for headache.   Yes [provider]  albuterol  (VENTOLIN  HFA) 108 (90 Base) MCG/ACT inhaler INHALE 2 PUFFS INTO THE LUNGS EVERY 6 HOURS AS NEEDED FOR WHEEZING OR SHORTNESS OF BREATH. 05/05/22  Yes Icard, Bradley L, DO  alendronate (FOSAMAX) 70 MG tablet Take 70 mg by mouth once a week. 12/11/18  Yes [provider]  ALPRAZolam  (XANAX ) 1 MG tablet Take 1 mg by mouth at bedtime. May take additional 1 mg during the day if needed 09/24/19  Yes [provider]  atorvastatin  (LIPITOR) 40 MG tablet Take 40 mg by mouth at bedtime.    Yes [provider]  benzonatate  (TESSALON ) 100 MG capsule Take 100 mg by mouth 3 (three) times daily as needed for cough.   Yes [provider]  FLUoxetine  (PROZAC ) 20 MG capsule Take 60 mg by mouth every morning. 08/16/23  Yes [provider]  lisinopril  (ZESTRIL ) 10 MG tablet Take 10 mg by mouth daily. 08/16/23  Yes [provider]  traZODone  (DESYREL ) 100 MG tablet Take 100 mg by mouth at  bedtime. 11/17/19  Yes [provider]  Fluticasone -Umeclidin-Vilant (TRELEGY ELLIPTA ) 100-62.5-25 MCG/ACT AEPB Inhale 1 each into the lungs daily. Patient not taking: Reported on 11/10/2023 05/06/22   Brenna Cain L, DO  hydrochlorothiazide  (HYDRODIURIL ) 25 MG tablet Take 1 tablet (25 mg total) by mouth daily. Patient not taking: Reported on 11/10/2023 03/15/22 10/06/23  Mallipeddi, Diannah SQUIBB, MD  lidocaine -prilocaine  (EMLA ) cream Apply a quarter-sized amount to port a cath site and cover with plastic wrap one hour prior to infusion appointments 07/22/22   Katragadda, Sreedhar, MD    Allergies: Zithromax [azithromycin] and Prednisone     Review of Systems  Constitutional:  Negative for appetite change and fatigue.  HENT:  Negative for congestion, ear discharge and sinus pressure.   Eyes:  Negative for discharge.  Respiratory:  Positive for shortness of breath. Negative for cough.   Cardiovascular:  Negative for chest pain.  Gastrointestinal:  Negative for abdominal pain and diarrhea.  Genitourinary:  Negative for frequency and hematuria.  Musculoskeletal:  Negative for back pain.  Skin:  Negative for rash.  Neurological:  Negative for seizures and headaches.  Psychiatric/Behavioral:  Negative for hallucinations.     Updated Vital Signs BP 132/82 (BP Location: Right Arm)   Pulse 72   Temp 97.8 F (36.6 C) (Oral)   Resp 19   Ht 5' 6 (1.676 m)   Wt 53.5 kg   SpO2 94%  BMI 19.05 kg/m   Physical Exam Vitals and nursing note reviewed.  Constitutional:      Appearance: She is well-developed.  HENT:     Head: Normocephalic.     Nose: Nose normal.  Eyes:     General: No scleral icterus.    Conjunctiva/sclera: Conjunctivae normal.  Neck:     Thyroid : No thyromegaly.  Cardiovascular:     Rate and Rhythm: Normal rate and regular rhythm.     Heart sounds: No murmur heard.    No friction rub. No gallop.  Pulmonary:     Breath sounds: No stridor. Wheezing present. No rales.   Chest:     Chest wall: No tenderness.  Abdominal:     General: There is no distension.     Tenderness: There is no abdominal tenderness. There is no rebound.  Musculoskeletal:        General: Normal range of motion.     Cervical back: Neck supple.  Lymphadenopathy:     Cervical: No cervical adenopathy.  Skin:    Findings: No erythema or rash.  Neurological:     Mental Status: She is oriented to person, place, and time.     Motor: No abnormal muscle tone.     Coordination: Coordination normal.  Psychiatric:        Behavior: Behavior normal.     (all labs ordered are listed, but only abnormal results are displayed) Labs Reviewed  CBC WITH DIFFERENTIAL/PLATELET - Abnormal; Notable for the following components:      Result Value   WBC 12.9 (*)    Neutro Abs 10.1 (*)    All other components within normal limits  COMPREHENSIVE METABOLIC PANEL WITH GFR - Abnormal; Notable for the following components:   Sodium 134 (*)    Chloride 97 (*)    Glucose, Bld 102 (*)    All other components within normal limits  D-DIMER, QUANTITATIVE - Abnormal; Notable for the following components:   D-Dimer, Quant 1.38 (*)    All other components within normal limits  BASIC METABOLIC PANEL WITH GFR - Abnormal; Notable for the following components:   Glucose, Bld 186 (*)    All other components within normal limits  EXPECTORATED SPUTUM ASSESSMENT W GRAM STAIN, RFLX TO RESP C  BRAIN NATRIURETIC PEPTIDE  HIV ANTIBODY (ROUTINE TESTING W REFLEX)  CBC    EKG: None  Radiology: CT Angio Chest PE W and/or Wo Contrast Result Date: 11/10/2023 CLINICAL DATA:  Pulmonary embolism (PE) suspected, high prob. Shortness of breath. EXAM: CT ANGIOGRAPHY CHEST WITH CONTRAST TECHNIQUE: Multidetector CT imaging of the chest was performed using the standard protocol during bolus administration of intravenous contrast. Multiplanar CT image reconstructions and MIPs were obtained to evaluate the vascular anatomy.  RADIATION DOSE REDUCTION: This exam was performed according to the departmental dose-optimization program which includes automated exposure control, adjustment of the mA and/or kV according to patient size and/or use of iterative reconstruction technique. CONTRAST:  75mL OMNIPAQUE  IOHEXOL  350 MG/ML SOLN COMPARISON:  09/29/2023 FINDINGS: Cardiovascular: No filling defects in the pulmonary arteries to suggest pulmonary emboli. Heart is normal size. Aorta is normal caliber. Scattered aortic atherosclerosis. Mediastinum/Nodes: No mediastinal, hilar, or axillary adenopathy. Trachea and esophagus are unremarkable. Thyroid  unremarkable. Lungs/Pleura: Mild centrilobular emphysema. Biapical scarring. Clustered nodules posteriorly in the right upper lobe, likely small airways disease. Airway thickening and mucous plugging in the left lower lobe and lingula. Airspace opacity in the left lower lobe likely reflects atelectasis. Trace right pleural effusion. Upper  Abdomen: No acute findings Musculoskeletal: Chest wall soft tissues are unremarkable. No acute bony abnormality. Review of the MIP images confirms the above findings. IMPRESSION: No evidence of pulmonary embolus. Airway thickening in the left lower lobe and lingula. Airspace opacity in the left lower lobe likely reflects atelectasis. Trace right pleural effusion. Flustered nodules posteriorly in the right upper lobe likely reflect small airways disease. Aortic Atherosclerosis (ICD10-I70.0) and Emphysema (ICD10-J43.9). Electronically Signed   By: Franky Crease M.D.   On: 11/10/2023 18:24   DG Chest Port 1 View Result Date: 11/10/2023 CLINICAL DATA:  sob EXAM: PORTABLE CHEST - 1 VIEW COMPARISON:  November 19, 2022 FINDINGS: Similarly positioned right chest port terminating at the cavoatrial junction. Biapical pleural thickening. Chronic blunting of the right costophrenic sulcus, a combination of scarring and small loculated effusion. Emphysema. No new airspace  consolidation or pleural effusion. No cardiomegaly. Aortic atherosclerosis. No acute fracture or destructive lesions. Multilevel thoracic osteophytosis. IMPRESSION: No acute cardiopulmonary abnormality. Electronically Signed   By: Rogelia Myers M.D.   On: 11/10/2023 15:37     Procedures   Medications Ordered in the ED  atorvastatin  (LIPITOR) tablet 40 mg (40 mg Oral Given 11/10/23 2306)  lisinopril  (ZESTRIL ) tablet 10 mg (10 mg Oral Given 11/11/23 0803)  ALPRAZolam  (XANAX ) tablet 1 mg (1 mg Oral Given 11/10/23 2306)  FLUoxetine  (PROZAC ) capsule 60 mg (60 mg Oral Given 11/11/23 0803)  traZODone  (DESYREL ) tablet 100 mg (100 mg Oral Given 11/10/23 2306)  cefTRIAXone  (ROCEPHIN ) 1 g in sodium chloride  0.9 % 100 mL IVPB (has no administration in time range)  methylPREDNISolone  sodium succinate (SOLU-MEDROL ) 125 mg/2 mL injection 125 mg (125 mg Intravenous Given 11/11/23 0609)    Followed by  predniSONE  (DELTASONE ) tablet 40 mg (has no administration in time range)  ipratropium-albuterol  (DUONEB) 0.5-2.5 (3) MG/3ML nebulizer solution 3 mL (has no administration in time range)  enoxaparin  (LOVENOX ) injection 40 mg (40 mg Subcutaneous Given 11/10/23 2306)  sodium chloride  flush (NS) 0.9 % injection 3 mL (3 mLs Intravenous Given 11/11/23 0804)  acetaminophen  (TYLENOL ) tablet 650 mg (has no administration in time range)    Or  acetaminophen  (TYLENOL ) suppository 650 mg (has no administration in time range)  senna-docusate (Senokot-S) tablet 1 tablet (has no administration in time range)  ondansetron  (ZOFRAN ) tablet 4 mg (has no administration in time range)    Or  ondansetron  (ZOFRAN ) injection 4 mg (has no administration in time range)  ipratropium-albuterol  (DUONEB) 0.5-2.5 (3) MG/3ML nebulizer solution 3 mL (has no administration in time range)  iohexol  (OMNIPAQUE ) 350 MG/ML injection 75 mL (75 mLs Intravenous Contrast Given 11/10/23 1807)  ipratropium-albuterol  (DUONEB) 0.5-2.5 (3) MG/3ML nebulizer  solution 3 mL (3 mLs Nebulization Given 11/10/23 1921)  methylPREDNISolone  sodium succinate (SOLU-MEDROL ) 125 mg/2 mL injection 125 mg (125 mg Intravenous Given 11/10/23 1920)  ondansetron  (ZOFRAN ) injection 4 mg (4 mg Intravenous Given 11/10/23 1919)  levofloxacin  (LEVAQUIN ) IVPB 500 mg (0 mg Intravenous Stopped 11/10/23 2032)     CRITICAL CARE Performed by: Fairy Sermon Total critical care time: 45 minutes Critical care time was exclusive of separately billable procedures and treating other patients. Critical care was necessary to treat or prevent imminent or life-threatening deterioration. Critical care was time spent personally by me on the following activities: development of treatment plan with patient and/or surrogate as well as nursing, discussions with consultants, evaluation of patient's response to treatment, examination of patient, obtaining history from patient or surrogate, ordering and performing treatments and interventions, ordering and review of  laboratory studies, ordering and review of radiographic studies, pulse oximetry and re-evaluation of patient's condition.                                Medical Decision Making Amount and/or Complexity of Data Reviewed Labs: ordered. Radiology: ordered. ECG/medicine tests: ordered.  Risk Prescription drug management. Decision regarding hospitalization.  Patient with wheezing and hypoxia from COPD exacerbation she will be admitted to medicine     Final diagnoses:  COPD exacerbation [facility])  Hypoxia    ED Discharge Orders     None          Suzette Pac, MD 11/11/23 1410    Suzette Pac, MD 12/05/23 1721

## 2023-11-11 NOTE — Progress Notes (Signed)
 Mobility Specialist Progress Note:    11/11/23 1000  Mobility  Activity Ambulated with assistance  Level of Assistance Modified independent, requires aide device or extra time  Assistive Device None  Distance Ambulated (ft) 200 ft  Range of Motion/Exercises Active;All extremities  Activity Response Tolerated well  Mobility Referral Yes  Mobility visit 1 Mobility  Mobility Specialist Start Time (ACUTE ONLY) 1000  Mobility Specialist Stop Time (ACUTE ONLY) 1020  Mobility Specialist Time Calculation (min) (ACUTE ONLY) 20 min   Pt received in bed, agreeable to mobility. ModI to stand and ambulate with no AD. Tolerated well, SpO2 95% on 3L at rest. SpO2 87-91% on 3 L during ambulation, SpO2 93% on 3L EOS. Returned pt supine, all needs met.  Raphael Espe Mobility Specialist Please contact via Special educational needs teacher or  Rehab office at [phone]

## 2023-11-11 NOTE — TOC Transition Note (Addendum)
 Transition of Care [facility]) - Discharge Note  Patient Details  Name: [name] MRN: [id number] Date of Birth: 07-Dec-1955  [facility] CM/SW Contact:  Nena LITTIE Coffee, RN Phone Number: 11/11/2023, 4:51 PM  Clinical Narrative:    Pt to dc home c/husband providing transportation. Arranged home oxygen  c/[employer]. Referral sent to Zach, oxygen  to be delivered to the room.   Final next level of care: Home/Self Care Barriers to Discharge: Barriers Resolved  Patient Goals and CMS Choice   Discharge Placement   Discharge Plan and Services Additional resources added to the After Visit Summary for           DME Arranged: Oxygen  DME Agency: [employer]   Social Drivers of Health (SDOH) Interventions SDOH Screenings   Food Insecurity: No Food Insecurity (11/10/2023)  Housing: Low Risk  (11/10/2023)  Transportation Needs: No Transportation Needs (11/10/2023)  Utilities: Not At Risk (11/10/2023)  Alcohol Screen: Low Risk  (01/20/2021)  Depression (PHQ2-9): Low Risk  (10/06/2023)  Financial Resource Strain: Unknown (01/20/2021)  Physical Activity: Inactive (01/20/2021)  Social Connections: Moderately Isolated (11/10/2023)  Stress: Stress Concern Present (01/20/2021)  Tobacco Use: High Risk (11/10/2023)   Readmission Risk Interventions    11/11/2023   10:12 AM  Readmission Risk Prevention Plan  Post Dischage Appt Complete  Medication Screening Complete  Transportation Screening Complete

## 2023-11-11 NOTE — Progress Notes (Signed)
   11/11/23 1012  TOC Brief Assessment  Insurance and Status Reviewed  Patient has primary care physician Yes  Home environment has been reviewed From home  Prior level of function: Independent  Prior/Current Home Services No current home services  Social Drivers of Health Review SDOH reviewed interventions complete (smoking cessation added to AVS)  Readmission risk has been reviewed Yes  Transition of care needs no transition of care needs at this time   Transition of Care Department [facility]) has reviewed patient and no TOC needs have been identified at this time. We will continue to monitor patient advancement through interdisciplinary progression rounds. If new patient transition needs arise, please place a TOC consult.

## 2023-11-11 NOTE — Discharge Summary (Signed)
 Physician Discharge Summary   Patient: [name] MRN: [id number] DOB: 1955/06/24  Admit date:     11/10/2023  Discharge date: 11/11/23  Discharge Physician: Reyes VEAR Gaw   PCP: Luke Agent, MD (Inactive)   Recommendations at discharge:   Patient discharged home on oxygen  with recommendations to wear until she follows up with PCP next week.   Recommended to discuss long-acting bronchodilator treatment with PCP.  Looks like she's been on this previously, but not taking on admission.    Discharged home with doxycycline  100 mg po BID x 6 more days and prednisone  20 mg po daily x 4 more days.  Reports she's unable to take more than 20 mg prednisone  due to stomach upset.   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 *  Hospital Course: 68 y.o. female with medical history significant for hypertension, hyperlipidemia, anxiety, emphysema, and squamous cell carcinoma of the right lung who presents with increased shortness of breath, cough, and low oxygen  saturations at home.  Patient reports worsening shortness of breath and cough productive of thick white sputum.  She notes that her pulse oximetry consistently shows saturation in the low to mid 80s.  She denies any fevers, chills, or chest pain.  Admitted for the same.  Did very well overnight, no further dyspnea or cough since admission.  Asking to go home today.   Due to degree of improvement both objectively and subjectively, able to DC home today with home oxygen .  Discharged also with new nebulizer machine to use every 6 hours and duonebs to use at home.  Schedule during the weekend, can move to PRN next week.   Assessment and  Plan: COPD exacerbation; acute hypoxic respiratory failure  - Much improved today.  - sitting up in bed, no further coughing, no shortness of breath.  - she's done well with bronchodilators.   - amb pulse ox showed desats to 88%, thus she was discharged home on O2 with instructions to follow up with PCP.  - she had script in system for long-acting bronchodilator, but she reports she wasn't taking this.  Recommended to follow up with PCP for discussion    2. Lung cancer  - Continue follow-up with Dr. Katragadda as planned    3. Hypertension  - Lisinopril      4. HLD  - Lipitor    5. Anxiety, insomnia  - Continue Proxac, Xanax , trazodone          Consultants: none Procedures performed: none  Disposition: Home Diet recommendation:  Discharge Diet Orders (From admission, onward)     Start     Ordered   11/11/23 0000  Diet - low sodium heart healthy        11/11/23 1538           Regular diet DISCHARGE MEDICATION: Allergies as of 11/11/2023       Reactions   Zithromax [azithromycin] Rash, Other (See Comments)   Blisters in mouth    Prednisone  Nausea Only   Sick to stomach Can take in low dose        Medication List     TAKE these medications    acetaminophen  325 MG tablet Commonly known as:  TYLENOL  Take 650 mg by mouth every 6 (six) hours as needed for headache.   albuterol  108 (90 Base) MCG/ACT inhaler Commonly known as: VENTOLIN  HFA INHALE 2 PUFFS INTO THE LUNGS EVERY 6 HOURS AS NEEDED FOR WHEEZING OR SHORTNESS OF BREATH.   alendronate 70 MG tablet Commonly known as: FOSAMAX Take 70 mg by mouth once a week.   ALPRAZolam  1 MG tablet Commonly known as: XANAX  Take 1 mg by mouth at bedtime. May take additional 1 mg during the day if needed   atorvastatin  40 MG tablet Commonly known as: LIPITOR Take 40 mg by mouth at bedtime.   benzonatate  100 MG capsule Commonly known as: TESSALON  Take 100 mg by mouth 3 (three) times daily as needed for cough.    doxycycline  100 MG tablet Commonly known as: VIBRA -TABS Take 1 tablet (100 mg total) by mouth 2 (two) times daily for 6 days.   FLUoxetine  20 MG capsule Commonly known as: PROZAC  Take 60 mg by mouth every morning.   hydrochlorothiazide  25 MG tablet Commonly known as: HYDRODIURIL  Take 1 tablet (25 mg total) by mouth daily.   ipratropium-albuterol  0.5-2.5 (3) MG/3ML Soln Commonly known as: DUONEB Take 3 mLs by nebulization every 6 (six) hours as needed (Shortness of breath, cough and/or wheeze).   lidocaine -prilocaine  cream Commonly known as: EMLA  Apply a quarter-sized amount to port a cath site and cover with plastic wrap one hour prior to infusion appointments   lisinopril  10 MG tablet Commonly known as: ZESTRIL  Take 10 mg by mouth daily.   predniSONE  20 MG tablet Commonly known as: DELTASONE  Take 2 tablets (40 mg total) by mouth daily with breakfast. Start taking on: November 12, 2023   traZODone  100 MG tablet Commonly known as: DESYREL  Take 100 mg by mouth at bedtime.   Trelegy Ellipta  100-62.5-25 MCG/ACT Aepb Generic drug: Fluticasone -Umeclidin-Vilant Inhale 1 each into the lungs daily.               Durable Medical Equipment  (From admission, onward)           Start     Ordered   11/11/23 1537  DME Oxygen   Once       Question Answer Comment  Length of Need 6 Months   Mode or (Route) Nasal cannula   Liters per Minute 2   Oxygen  delivery system Gas      11/11/23 1538   11/11/23 0000  For home use only DME Nebulizer machine       Question Answer Comment  Patient needs a nebulizer to treat with the following condition COPD (chronic obstructive pulmonary disease) (HCC)   Length of Need 6 Months   Additional equipment included Administration kit      11/11/23 1626            Discharge Exam: Filed Weights   11/10/23 1404  Weight: 53.5 kg   Gen:  Alert, cooperative patient who appears stated age in no acute distress.  Vital signs reviewed.   Wearing [place] HEENT:  [place]/AT Heart:  RRR Lungs:  Some rhonchi BL bases with scattered wheezing Abd:  S/ND/NT Ext:  No LE edema Neuro:  alert and oriented x 4 without any focal deficits Psych:  Pleasant/cooperative.   Condition at discharge: good  The results of significant diagnostics from this hospitalization (including imaging, microbiology, ancillary and laboratory) are listed below for reference.   Imaging Studies: CT Angio Chest PE W and/or Wo Contrast Result Date: 11/10/2023 CLINICAL DATA:  Pulmonary embolism (PE)  suspected, high prob. Shortness of breath. EXAM: CT ANGIOGRAPHY CHEST WITH CONTRAST TECHNIQUE: Multidetector CT imaging of the chest was performed using the standard protocol during bolus administration of intravenous contrast. Multiplanar CT image reconstructions and MIPs were obtained to evaluate the vascular anatomy. RADIATION DOSE REDUCTION: This exam was performed according to the departmental dose-optimization program which includes automated exposure control, adjustment of the mA and/or kV according to patient size and/or use of iterative reconstruction technique. CONTRAST:  75mL OMNIPAQUE  IOHEXOL  350 MG/ML SOLN COMPARISON:  09/29/2023 FINDINGS: Cardiovascular: No filling defects in the pulmonary arteries to suggest pulmonary emboli. Heart is normal size. Aorta is normal caliber. Scattered aortic atherosclerosis. Mediastinum/Nodes: No mediastinal, hilar, or axillary adenopathy. Trachea and esophagus are unremarkable. Thyroid  unremarkable. Lungs/Pleura: Mild centrilobular emphysema. Biapical scarring. Clustered nodules posteriorly in the right upper lobe, likely small airways disease. Airway thickening and mucous plugging in the left lower lobe and lingula. Airspace opacity in the left lower lobe likely reflects atelectasis. Trace right pleural effusion. Upper Abdomen: No acute findings Musculoskeletal: Chest wall soft tissues are unremarkable. No acute bony abnormality. Review of  the MIP images confirms the above findings. IMPRESSION: No evidence of pulmonary embolus. Airway thickening in the left lower lobe and lingula. Airspace opacity in the left lower lobe likely reflects atelectasis. Trace right pleural effusion. Flustered nodules posteriorly in the right upper lobe likely reflect small airways disease. Aortic Atherosclerosis (ICD10-I70.0) and Emphysema (ICD10-J43.9). Electronically Signed   By: Franky Crease M.D.   On: 11/10/2023 18:24   DG Chest Port 1 View Result Date: 11/10/2023 CLINICAL DATA:  sob EXAM: PORTABLE CHEST - 1 VIEW COMPARISON:  November 19, 2022 FINDINGS: Similarly positioned right chest port terminating at the cavoatrial junction. Biapical pleural thickening. Chronic blunting of the right costophrenic sulcus, a combination of scarring and small loculated effusion. Emphysema. No new airspace consolidation or pleural effusion. No cardiomegaly. Aortic atherosclerosis. No acute fracture or destructive lesions. Multilevel thoracic osteophytosis. IMPRESSION: No acute cardiopulmonary abnormality. Electronically Signed   By: Rogelia Myers M.D.   On: 11/10/2023 15:37    Microbiology: Results for orders placed or performed during the hospital encounter of 11/19/22  Culture, blood (Routine x 2)     Status: None   Collection Time: 11/19/22  6:01 PM   Specimen: BLOOD  Result Value Ref Range Status   Specimen Description BLOOD BLOOD RIGHT ARM  Final   Special Requests   Final    BOTTLES DRAWN AEROBIC AND ANAEROBIC Blood Culture results may not be optimal due to an excessive volume of blood received in culture bottles   Culture   Final    NO GROWTH 5 DAYS Performed at [facility], [address] [place], [place] [address]    Report Status 11/24/2022 FINAL  Final  Culture, blood (Routine x 2)     Status: None   Collection Time: 11/19/22  6:51 PM   Specimen: BLOOD  Result Value Ref Range Status   Specimen Description BLOOD RIGHT ANTECUBITAL  Final   Special  Requests   Final    BOTTLES DRAWN AEROBIC AND ANAEROBIC Blood Culture adequate volume   Culture   Final    NO GROWTH 5 DAYS Performed at [facility], [address] [place], [place] [address]    Report Status 11/24/2022 FINAL  Final    Labs: CBC: Recent Labs  Lab 11/10/23 1450 11/11/23 0436  WBC 12.9* 5.9  NEUTROABS 10.1*  --   HGB 14.6 13.5  HCT 44.8 41.4  MCV  89.2 90.0  PLT 278 259   Basic Metabolic Panel: Recent Labs  Lab 11/10/23 1450 11/11/23 0436  NA 134* 137  K 4.2 4.9  CL 97* 100  CO2 22 26  GLUCOSE 102* 186*  BUN 13 14  CREATININE 0.99 0.87  CALCIUM  9.5 9.3   Liver Function Tests: Recent Labs  Lab 11/10/23 1450  AST 21  ALT 18  ALKPHOS 113  BILITOT 0.7  PROT 8.0  ALBUMIN  3.8   CBG: No results for input(s): GLUCAP in the last 168 hours.  Discharge time spent: less than 30 minutes.  Signed: Reyes VEAR Gaw, MD [facility] 11/11/2023

## 2023-11-11 NOTE — Progress Notes (Signed)
 SATURATION QUALIFICATIONS: (This note is used to comply with regulatory documentation for home oxygen )  Patient Saturations on Room Air at Rest = 95%  Patient Saturations on Room Air while Ambulating = 88%  Patient Saturations on 3 Liters of oxygen  while Ambulating = 91%  Please briefly explain why patient needs home oxygen : pt ambulated about 50 ft, o2 saturation decreases during ambulation while on 3L.

## 2023-11-28 ENCOUNTER — Ambulatory Visit (HOSPITAL_COMMUNITY)
Admission: RE | Admit: 2023-11-28 | Discharge: 2023-11-28 | Disposition: A | Discharge status: Home / Self Care | Source: Ambulatory Visit | Attending: Hematology | Admitting: Hematology

## 2023-11-28 DIAGNOSIS — C3431 Malignant neoplasm of lower lobe, right bronchus or lung: Secondary | ICD-10-CM | POA: Diagnosis present

## 2023-11-28 MED ORDER — IOHEXOL 300 MG/ML  SOLN
80.0000 mL | Freq: Once | INTRAMUSCULAR | Status: AC | PRN
  Administered 2023-11-28: 80 mL via INTRAVENOUS

## 2023-12-06 ENCOUNTER — Ambulatory Visit: Payer: Self-pay | Admitting: Oncology

## 2023-12-06 ENCOUNTER — Inpatient Hospital Stay

## 2023-12-06 ENCOUNTER — Inpatient Hospital Stay: Attending: Hematology | Admitting: Oncology

## 2023-12-06 VITALS — BP 162/98 | HR 80 | Temp 97.8°F | Resp 18 | Wt 122.5 lb

## 2023-12-06 DIAGNOSIS — C3431 Malignant neoplasm of lower lobe, right bronchus or lung: Secondary | ICD-10-CM

## 2023-12-06 DIAGNOSIS — R42 Dizziness and giddiness: Secondary | ICD-10-CM | POA: Diagnosis not present

## 2023-12-06 DIAGNOSIS — R5383 Other fatigue: Secondary | ICD-10-CM | POA: Insufficient documentation

## 2023-12-06 DIAGNOSIS — R519 Headache, unspecified: Secondary | ICD-10-CM | POA: Diagnosis not present

## 2023-12-06 DIAGNOSIS — F17210 Nicotine dependence, cigarettes, uncomplicated: Secondary | ICD-10-CM | POA: Insufficient documentation

## 2023-12-06 DIAGNOSIS — F32A Depression, unspecified: Secondary | ICD-10-CM | POA: Insufficient documentation

## 2023-12-06 DIAGNOSIS — F419 Anxiety disorder, unspecified: Secondary | ICD-10-CM | POA: Insufficient documentation

## 2023-12-06 LAB — TSH: TSH: 0.818 u[IU]/mL (ref 0.350–4.500)

## 2023-12-06 LAB — CBC WITH DIFFERENTIAL/PLATELET
Abs Immature Granulocytes: 0.03 K/uL (ref 0.00–0.07)
Basophils Absolute: 0.1 K/uL (ref 0.0–0.1)
Basophils Relative: 1 %
Eosinophils Absolute: 0.3 K/uL (ref 0.0–0.5)
Eosinophils Relative: 4 %
HCT: 39.2 % (ref 36.0–46.0)
Hemoglobin: 12.7 g/dL (ref 12.0–15.0)
Immature Granulocytes: 0 %
Lymphocytes Relative: 29 %
Lymphs Abs: 2.8 K/uL (ref 0.7–4.0)
MCH: 29.3 pg (ref 26.0–34.0)
MCHC: 32.4 g/dL (ref 30.0–36.0)
MCV: 90.5 fL (ref 80.0–100.0)
Monocytes Absolute: 0.4 K/uL (ref 0.1–1.0)
Monocytes Relative: 5 %
Neutro Abs: 5.9 K/uL (ref 1.7–7.7)
Neutrophils Relative %: 61 %
Platelets: 273 K/uL (ref 150–400)
RBC: 4.33 MIL/uL (ref 3.87–5.11)
RDW: 15.1 % (ref 11.5–15.5)
WBC: 9.5 K/uL (ref 4.0–10.5)
nRBC: 0 % (ref 0.0–0.2)

## 2023-12-06 LAB — COMPREHENSIVE METABOLIC PANEL WITH GFR
ALT: 14 U/L (ref 0–44)
AST: 19 U/L (ref 15–41)
Albumin: 3.3 g/dL — ABNORMAL LOW (ref 3.5–5.0)
Alkaline Phosphatase: 80 U/L (ref 38–126)
Anion gap: 11 (ref 5–15)
BUN: 15 mg/dL (ref 8–23)
CO2: 23 mmol/L (ref 22–32)
Calcium: 9.1 mg/dL (ref 8.9–10.3)
Chloride: 105 mmol/L (ref 98–111)
Creatinine, Ser: 0.97 mg/dL (ref 0.44–1.00)
GFR, Estimated: >60 mL/min (ref >60)
Glucose, Bld: 88 mg/dL (ref 70–99)
Potassium: 4.2 mmol/L (ref 3.5–5.1)
Sodium: 139 mmol/L (ref 135–145)
Total Bilirubin: 0.4 mg/dL (ref 0.0–1.2)
Total Protein: 6.8 g/dL (ref 6.5–8.1)

## 2023-12-06 NOTE — Progress Notes (Signed)
 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  Reports some change in cough but with clear expectoration.  No hemoptysis reported. -Reviewed labs from 11/11/2023 which were essentially unremarkable. -Most recent CT scan for follow-up for possible infection back in June showed increased now complete atelectasis and consolidation of left lower lobe with fibrotic air bronchograms.  No significant change in clustered centrilobular and tree-in-bud nodularity throughout the lungs particularly conspicuous in the dependent right upper lobe and dependent left upper lobe findings are consistent with atypical infection or aspiration.  Unchanged small loculated right pleural effusion with associated pleural thickening. -Discussed case with Dr. Davonna who has concerns for recurrence.  Recommends a PET scan.  Will get this order placed and have her return to clinic a few days later. -Patient was also previously followed by pulmonology but they have since left the practice.  Would like to be referred back to a pulmonologist here locally in [place].  Referral sent.   Orders Placed This Encounter  Procedures   NM PET Image Restage (PS) Skull Base to Thigh (F-18 FDG)    Standing Status:   Future    Expected Date:   12/13/2023    Expiration Date:   12/05/2024    If indicated for the ordered procedure, I authorize the administration of a radiopharmaceutical per Radiology protocol:   Yes    Preferred imaging location?:   [facility]   Comprehensive metabolic panel    Standing Status:   Future    Expected Date:   01/06/2024    Expiration Date:   12/05/2024   CBC with Differential    Standing Status:   Future    Expected Date:   01/06/2024    Expiration Date:   12/05/2024   TSH    Standing Status:   Future     Expected Date:   01/06/2024    Expiration Date:   12/05/2024   Ambulatory referral to Pulmonology    Referral Priority:   Routine    Referral Type:   Consultation    Referral Reason:   Specialty Services Required    Requested Specialty:   Pulmonary Disease    Number of Visits Requested:   1    INTERVAL HISTORY: Patient returns for follow-up for lung and cervical cancer.  Patient was recently hospitalized from 11/10/2023 through 11/11/2023 for acute respiratory failure secondary to COPD.  Patient recently had a CT scan to follow-up for infectious process noted in June.  CT scan showed increased now complete atelectasis and consolidation of left lower lobe with fibrotic air bronchograms.  No significant change in clustered centrilobular and tree-in-bud nodularity throughout the lungs particularly conspicuous in the dependent right upper lobe and dependent left upper lobe findings are consistent with atypical infection or aspiration.  Unchanged small loculated right pleural effusion with associated pleural thickening.  Reports she continues to cough and it has significantly worsened over the past couple of months.  Reports she is coughing up phlegm although it is clear in color.  Reports shortness of breath at times.  Has trouble swallowing and liquid has gone down the wrong pipe.  Has occasional dizziness and headaches.  Has anxiety and depression.  Reports she was given Levaquin  by Dr. Rogers back in June for presumed lung infection  which did not help.  She then was seen in the emergency room who switched her antibiotic and again by her PCP who switched to the third time.  Reports her symptoms have not improved if at all.  She continues to smoke.  We reviewed CT chest without contrast, CMP, CBC.  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 - 18 pound weight loss in the last 3 months due to decreased appetite - Chest x-ray on 04/17/2022: Mass in the right lower lobe. - CT chest (04/26/2022): 3.8 cm cavitary mass in the anterior right lower lobe abutting the major fissure.  4 mm subpleural nodule in the medial right upper lobe is nonspecific.  No adenopathy on noncontrast exam. - PET scan (05/06/2022): 3.8 x 2.3 cm hypermetabolic right lower lobe mass SUV 17.8.  Mildly metabolic asymmetric right hilar nodal tissue with SUV 4.2.  Prominent precarinal lymph node 9 mm with SUV 2.2. - Brain MRI (05/19/2022): No evidence of intracranial metastatic disease. - Bronchoscopy and biopsy by Dr. Brenna. - Pathology: Lymph node 11 R, 7 with no malignant cells.  Right lower lobe cavitary mass FNA consistent with squamous cell carcinoma, keratinizing. - 06/14/2022: Robotic assisted right lower lobectomy, lymph node dissection by Dr. Kerrin. - Pathology: 4.3 x 3.9 x 2.8 cm right lower lobe squamous cell carcinoma, margins negative to.  Negative visceral pleural invasion and lymphovascular invasion.  0/18 lymph nodes involved.  Nodal stations examined include 4, 7, 8, 9, 10, 11, 12.  PT2b pN0. - 4 cycles of docetaxel  and carboplatin  from 07/26/2022 through 09/28/2022. - NGS: PD-L1 TPS 30%, TMB-high.  Negative for ALK/EGFR/BRAF/KRAS/MET/RET/ROS1.  Positive for PIK3CA and T p53. - Adjuvant pembrolizumab  for 1 year started on 11/10/2022, discontinued after cycle 1.  CBC    Component Value Date/Time   WBC 9.5 12/06/2023 1346   RBC 4.33 12/06/2023 1346   HGB 12.7 12/06/2023 1346    HCT 39.2 12/06/2023 1346   PLT 273 12/06/2023 1346   MCV 90.5 12/06/2023 1346   MCH 29.3 12/06/2023 1346   MCHC 32.4 12/06/2023 1346   RDW 15.1 12/06/2023 1346   LYMPHSABS 2.8 12/06/2023 1346   MONOABS 0.4 12/06/2023 1346   EOSABS 0.3 12/06/2023 1346   BASOSABS 0.1 12/06/2023 1346       Latest Ref Rng & Units 12/06/2023    1:46 PM 11/11/2023    4:36 AM 11/10/2023    2:50 PM  CMP  Glucose 70 - 99 mg/dL 88  813  897   BUN 8 - 23 mg/dL 15  14  13    Creatinine 0.44 - 1.00 mg/dL 9.02  9.12  9.00   Sodium 135 - 145 mmol/L 139  137  134   Potassium 3.5 - 5.1 mmol/L 4.2  4.9  4.2   Chloride 98 - 111 mmol/L 105  100  97   CO2 22 - 32 mmol/L 23  26  22  Calcium  8.9 - 10.3 mg/dL 9.1  9.3  9.5   Total Protein 6.5 - 8.1 g/dL 6.8   8.0   Total Bilirubin 0.0 - 1.2 mg/dL 0.4   0.7   Alkaline Phos 38 - 126 U/L 80   113   AST 15 - 41 U/L 19   21   ALT 0 - 44 U/L 14   18      No results found for: FERRITIN, VITAMINB12  Vitals:   12/06/23 1303  BP: (!) 162/98  Pulse: 80  Resp: 18  Temp: 97.8 F (36.6 C)  SpO2: 92%    Review of System:  Review of Systems  Constitutional:  Positive for malaise/fatigue. Negative for weight loss.  Respiratory:  Positive for cough, sputum production and shortness of breath.   Neurological:  Positive for dizziness and headaches.  Endo/Heme/Allergies:  Bruises/bleeds easily.  Psychiatric/Behavioral:  Positive for depression. The patient is nervous/anxious.     Physical Exam: Physical Exam Constitutional:      Appearance: Normal appearance.  HENT:     Head: Normocephalic and atraumatic.  Eyes:     Pupils: Pupils are equal, round, and reactive to light.  Cardiovascular:     Rate and Rhythm: Normal rate and regular rhythm.     Heart sounds: Normal heart sounds. No murmur heard. Pulmonary:     Effort: Pulmonary effort is normal.     Breath sounds: Decreased breath sounds present. No wheezing.  Abdominal:     General: Bowel sounds are normal.  There is no distension.     Palpations: Abdomen is soft.     Tenderness: There is no abdominal tenderness.  Musculoskeletal:        General: Normal range of motion.     Cervical back: Normal range of motion.  Skin:    General: Skin is warm and dry.     Findings: No rash.  Neurological:     Mental Status: She is alert and oriented to person, place, and time.     Gait: Gait is intact.  Psychiatric:        Mood and Affect: Mood and affect normal.        Cognition and Memory: Memory normal.        Judgment: Judgment normal.      I spent 25 minutes dedicated to the care of this patient (face-to-face and non-face-to-face) on the date of the encounter to include what is described in the assessment and plan.,  Delon Hope, NP 12/06/2023 3:40 PM

## 2023-12-06 NOTE — Progress Notes (Signed)
 [name] presented for Portacath access and flush. Proper placement of portacath confirmed by CXR. Portacath located right chest wall accessed with  H 20 needle. Good blood return present. Portacath flushed with 20ml NS and needle removed intact. Procedure without incident. Patient tolerated procedure well.

## 2023-12-06 NOTE — Progress Notes (Signed)
 We look at the CT scan for me.  Dr. Katragadda gave her 10-day course of Levaquin  back in June for a respiratory infection.  I think this was follow-up for that.  I have not seen her yet but I will see her today.  Delon Hope, NP 12/06/2023 8:32 AM

## 2023-12-06 NOTE — Assessment & Plan Note (Addendum)
-   She is continuing to smokes half pack of cigarettes per day.  Reports some change in cough but with clear expectoration.  No hemoptysis reported. -Reviewed labs from 11/11/2023 which were essentially unremarkable. -Most recent CT scan for follow-up for possible infection back in June showed increased now complete atelectasis and consolidation of left lower lobe with fibrotic air bronchograms.  No significant change in clustered centrilobular and tree-in-bud nodularity throughout the lungs particularly conspicuous in the dependent right upper lobe and dependent left upper lobe findings are consistent with atypical infection or aspiration.  Unchanged small loculated right pleural effusion with associated pleural thickening. -Discussed case with Dr. Davonna who has concerns for recurrence.  Recommends a PET scan.  Will get this order placed and have her return to clinic a few days later. -Patient was also previously followed by pulmonology but they have since left the practice.  Would like to be referred back to a pulmonologist here locally in [place].  Referral sent.

## 2023-12-15 ENCOUNTER — Ambulatory Visit (HOSPITAL_COMMUNITY)
Admission: RE | Admit: 2023-12-15 | Discharge: 2023-12-15 | Disposition: A | Discharge status: Home / Self Care | Source: Ambulatory Visit | Attending: Oncology | Admitting: Oncology

## 2023-12-15 DIAGNOSIS — C3431 Malignant neoplasm of lower lobe, right bronchus or lung: Secondary | ICD-10-CM | POA: Diagnosis present

## 2023-12-15 MED ORDER — FLUDEOXYGLUCOSE F - 18 (FDG) INJECTION
6.0000 | Freq: Once | INTRAVENOUS | Status: AC | PRN
  Administered 2023-12-15: 6 via INTRAVENOUS

## 2023-12-21 ENCOUNTER — Inpatient Hospital Stay: Attending: Hematology | Admitting: Oncology

## 2023-12-21 VITALS — BP 138/87 | HR 79 | Temp 98.1°F | Resp 19 | Wt 121.2 lb

## 2023-12-21 DIAGNOSIS — F17210 Nicotine dependence, cigarettes, uncomplicated: Secondary | ICD-10-CM | POA: Insufficient documentation

## 2023-12-21 DIAGNOSIS — C3431 Malignant neoplasm of lower lobe, right bronchus or lung: Secondary | ICD-10-CM | POA: Insufficient documentation

## 2023-12-21 NOTE — Progress Notes (Signed)
 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  Reports some change in cough but with clear expectoration.  No hemoptysis reported. -PET scan from 12/15/2023 shows marked improvement in left lobe atelectasis, no evidence of left lobe malignancy.  Small peripheral nodule in left upper lobe favors benign.  Right hilar and right paratracheal mild adenopathy is favored reactive.  Post right lobectomy with small effusion.  No evidence of local recurrence.  No evidence of metastatic disease outside the thorax.  Post hysterectomy.  Adnexa unremarkable. -She was referred back to pulmonology at her last visit due to breathing concerns.  States she has an appointment with [place] [facility] at the end of the month.  Previously followed by pulmonology but they went out of business and she was on Trelegy.  Reports she found additional supply of this and started to take it which has helped her breathing tremendously. -We discussed follow-up in 4 to 5 months with CT scan followed by labs and see me back.  At that point, it will be 2 years post diagnosis and treatment and she can go to every 3-month CT scans and visits as long as neck scan looks good.  Orders Placed This Encounter  Procedures   CT Chest W Contrast    Standing Status:   Future    Expected Date:   05/22/2024    Expiration Date:   12/20/2024    If indicated for the ordered procedure, I authorize the administration of contrast media per Radiology protocol:   Yes    Does the patient have a contrast media/X-ray dye allergy?:   No    Preferred imaging location?:   [facility]    Release to patient:   Immediate [1]   TSH    Standing Status:   Future    Expected Date:   05/22/2024    Expiration Date:   08/20/2024   CBC with Differential     Standing Status:   Future    Expected Date:   05/22/2024    Expiration Date:   08/20/2024   Comprehensive metabolic panel    Standing Status:   Future    Expected Date:   05/22/2024    Expiration Date:   08/20/2024    INTERVAL HISTORY: Patient returns for follow-up for history of lung cancer.  She was seen last month after CT scan which was concerning for recurrence so a PET scan was ordered.  She is here to review these results today.  Reports she has new appointment with [facility] at the end of the month.  States she was previously seen by pulmonology in [place] with a 1 out of business.  She found an old unused inhaler that was prescribed called Trelegy and started to use about a week ago which has improved her breathing quite a bit.  Overall, she feels pretty good.  She did review her PET scan with her daughter who is a Engineer, civil (consulting).  We reviewed most recent PET scan.  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   07/26/2022 - 09/30/2022 Chemotherapy   Patient is on Treatment Plan : LUNG Carboplatin  (  AUC 6) + Docetaxel  (75) q21d x 4 cycles     11/10/2022 - 11/10/2022 Chemotherapy   Patient is on Treatment Plan : LUNG NSCLC Pembrolizumab  (200) q21d        CBC    Component Value Date/Time   WBC 9.5 12/06/2023 1346   RBC 4.33 12/06/2023 1346   HGB 12.7 12/06/2023 1346   HCT 39.2 12/06/2023 1346   PLT 273 12/06/2023 1346   MCV 90.5 12/06/2023 1346   MCH 29.3 12/06/2023 1346   MCHC 32.4 12/06/2023 1346   RDW 15.1 12/06/2023 1346   LYMPHSABS 2.8 12/06/2023 1346   MONOABS 0.4 12/06/2023 1346   EOSABS 0.3 12/06/2023 1346   BASOSABS 0.1 12/06/2023 1346       Latest Ref Rng & Units 12/06/2023    1:46 PM 11/11/2023    4:36 AM 11/10/2023    2:50 PM  CMP  Glucose 70 - 99 mg/dL 88  813  897   BUN 8 - 23 mg/dL 15   14  13    Creatinine 0.44 - 1.00 mg/dL 9.02  9.12  9.00   Sodium 135 - 145 mmol/L 139  137  134   Potassium 3.5 - 5.1 mmol/L 4.2  4.9  4.2   Chloride 98 - 111 mmol/L 105  100  97   CO2 22 - 32 mmol/L 23  26  22    Calcium  8.9 - 10.3 mg/dL 9.1  9.3  9.5   Total Protein 6.5 - 8.1 g/dL 6.8   8.0   Total Bilirubin 0.0 - 1.2 mg/dL 0.4   0.7   Alkaline Phos 38 - 126 U/L 80   113   AST 15 - 41 U/L 19   21   ALT 0 - 44 U/L 14   18      No results found for: FERRITIN, VITAMINB12  Vitals:   12/21/23 1304  BP: 138/87  Pulse: 79  Resp: 19  Temp: 98.1 F (36.7 C)  SpO2: 98%    Review of System:  Review of Systems  Respiratory:  Positive for cough.   Gastrointestinal:  Positive for constipation.  Neurological:  Positive for tingling and sensory change.    Physical Exam: Physical Exam Constitutional:      Appearance: Normal appearance.  HENT:     Head: Normocephalic and atraumatic.  Eyes:     Pupils: Pupils are equal, round, and reactive to light.  Cardiovascular:     Rate and Rhythm: Normal rate and regular rhythm.     Heart sounds: Normal heart sounds. No murmur heard. Pulmonary:     Effort: Pulmonary effort is normal.     Breath sounds: Normal breath sounds. No wheezing.  Abdominal:     General: Bowel sounds are normal. There is no distension.     Palpations: Abdomen is soft.     Tenderness: There is no abdominal tenderness.  Musculoskeletal:        General: Normal range of motion.     Cervical back: Normal range of motion.  Skin:    General: Skin is warm and dry.     Findings: No rash.  Neurological:     Mental Status: She is alert and oriented to person, place, and time.     Gait: Gait is intact.  Psychiatric:        Mood and Affect: Mood and affect normal.        Cognition and Memory: Memory normal.        Judgment: Judgment  normal.      I spent 20 minutes dedicated to the care of this patient (face-to-face and non-face-to-face) on the date of the  encounter to include what is described in the assessment and plan.,  Delon Hope, NP 12/21/2023 1:19 PM

## 2023-12-21 NOTE — Assessment & Plan Note (Addendum)
-   She is continuing to smokes half pack of cigarettes per day.  Reports some change in cough but with clear expectoration.  No hemoptysis reported. -PET scan from 12/15/2023 shows marked improvement in left lobe atelectasis, no evidence of left lobe malignancy.  Small peripheral nodule in left upper lobe favors benign.  Right hilar and right paratracheal mild adenopathy is favored reactive.  Post right lobectomy with small effusion.  No evidence of local recurrence.  No evidence of metastatic disease outside the thorax.  Post hysterectomy.  Adnexa unremarkable. -She was referred back to pulmonology at her last visit due to breathing concerns.  States she has an appointment with [place] [facility] at the end of the month.  Previously followed by pulmonology but they went out of business and she was on Trelegy.  Reports she found additional supply of this and started to take it which has helped her breathing tremendously. -We discussed follow-up in 4 to 5 months with CT scan followed by labs and see me back.  At that point, it will be 2 years post diagnosis and treatment and she can go to every 69-month CT scans and visits as long as neck scan looks good.

## 2024-01-09 NOTE — Progress Notes (Deleted)
 New Patient Pulmonology Office Visit   Subjective:  Patient ID: [name], female    DOB: 1956/01/14  MRN: [id number]  Referred by: Geofm Delon BRAVO, NP  CC: No chief complaint on file.   HPI [name] is a 68 y.o. female with a PMH significant for NSCLC Stage IIA (squamous subtype) s/p RLL lobectomy and chemoRx, nicotine dependence who presents to establish care for dyspnea.    CTD symptoms:  PMH:  - Hx of TB ***  - Hx of endemic fungal infections ***  - Hx of Sarcoidosis ***  - Hx of CTD ***  - Hx of lymphoma or other hematological malignancies ***  - Hx of irradiation to the chest or immunotherapy ***   PSH:   Medications:   Allergies:   Social Hx: - Smoking ***  - Second hand smoking ***  - Vaping ***  - Alcohol ***  - Illicit substances ***  - Indoor emission from household combustion ***  - Hobbies (e.g. wood work, Surveyor, quantity, bird breeding etc...)  - Landscape architect (e.g. mold) ***  - Pets ***  - Birds ***   {Occupational Exposures:33652}  Family Hx:  - Lung disease ***  - CTD ***  - Sarcoidosis ***  - Cancer ***    {PULM QUESTIONNAIRES (Optional):33196}  ROS  Allergies: Zithromax [azithromycin] and Prednisone   Current Outpatient Medications:    acetaminophen  (TYLENOL ) 325 MG tablet, Take 650 mg by mouth every 6 (six) hours as needed for headache., Disp: , Rfl:    albuterol  (VENTOLIN  HFA) 108 (90 Base) MCG/ACT inhaler, INHALE 2 PUFFS INTO THE LUNGS EVERY 6 HOURS AS NEEDED FOR WHEEZING OR SHORTNESS OF BREATH., Disp: 8.5 g, Rfl: 2   alendronate (FOSAMAX) 70 MG tablet, Take 70 mg by mouth once a week., Disp: , Rfl:    ALPRAZolam  (XANAX ) 1 MG tablet, Take 1 mg by mouth at bedtime. May take additional 1 mg during the day if needed, Disp: , Rfl:    atorvastatin  (LIPITOR) 40 MG tablet, Take 40 mg by mouth at bedtime. , Disp: , Rfl:    benzonatate  (TESSALON ) 100 MG capsule, Take 100 mg by mouth 3 (three) times daily as needed for  cough., Disp: , Rfl:    FLUoxetine  (PROZAC ) 20 MG capsule, Take 60 mg by mouth every morning., Disp: , Rfl:    Fluoxetine  HCl, PMDD, 20 MG TABS, Take 3 tablets every day by oral route., Disp: , Rfl:    Fluticasone -Umeclidin-Vilant (TRELEGY ELLIPTA ) 100-62.5-25 MCG/ACT AEPB, Inhale 1 each into the lungs daily., Disp: 60 each, Rfl: 2   ipratropium-albuterol  (DUONEB) 0.5-2.5 (3) MG/3ML SOLN, Take 3 mLs by nebulization every 6 (six) hours as needed (Shortness of breath, cough and/or wheeze)., Disp: , Rfl:    lidocaine -prilocaine  (EMLA ) cream, Apply a quarter-sized amount to port a cath site and cover with plastic wrap one hour prior to infusion appointments, Disp: 30 g, Rfl: 3   lisinopril  (ZESTRIL ) 10 MG tablet, Take 10 mg by mouth daily., Disp: , Rfl:    traZODone  (DESYREL ) 100 MG tablet, Take 100 mg by mouth at bedtime., Disp: , Rfl:  Past Medical History:  Diagnosis Date   Anxiety    Arthritis    spinal stenosis   Back disorder    crooked spine   Bulging lumbar disc    Cervical cancer (HCC)    Complication of anesthesia    ? [facility]    Depression    Dyspnea  H/O degenerative disc disease    History of radiation therapy 02/22/17-03/22/17   vaginal cuff 30 Gy in 5 fractions   Hypertension    Hypothyroidism    patient taken off of hypothyroid med in 11/2016    Osteoporosis    Thyroid  disease    Past Surgical History:  Procedure Laterality Date   ABDOMINAL EXPOSURE N/A 09/27/2018   Procedure: ABDOMINAL EXPOSURE;  Surgeon: Serene Gaile ORN, MD;  Location: [facility] OR;  Service: Vascular;  Laterality: N/A;   ABDOMINAL HYSTERECTOMY     ANTERIOR AND POSTERIOR REPAIR N/A 11/09/2016   Procedure: ANTERIOR (CYSTOCELE) AND POSTERIOR REPAIR (RECTOCELE);  Surgeon: Edsel Norleen GAILS, MD;  Location: [facility] ORS;  Service: Gynecology;  Laterality: N/A;   ANTERIOR LUMBAR FUSION Left 09/27/2018   Procedure: ANTERIOR LUMBAR FUSION L5-S1,;  Surgeon: Burnetta Aures, MD;   Location: [facility] OR;  Service: Orthopedics;  Laterality: Left;  4.5 HRS/ DR. SERENE ASSIST   BACK SURGERY     BILATERAL SALPINGECTOMY Left 11/09/2016   Procedure: LEFT SALPINGECTOMY;  Surgeon: Edsel Norleen GAILS, MD;  Location: [facility];  Service: Gynecology;  Laterality: Left;   BRONCHIAL BIOPSY  05/13/2022   Procedure: BRONCHIAL BIOPSIES;  Surgeon: Brenna Adine CROME, DO;  Location: [facility] END[facility];  Service: Pulmonary;;   BRONCHIAL BRUSHINGS  05/13/2022   Procedure: BRONCHIAL BRUSHINGS;  Surgeon: Brenna Adine CROME, DO;  Location: [facility];  Service: Pulmonary;;   BRONCHIAL NEEDLE ASPIRATION BIOPSY  05/13/2022   Procedure: BRONCHIAL NEEDLE ASPIRATION BIOPSIES;  Surgeon: Brenna Adine CROME, DO;  Location: [facility];  Service: Pulmonary;;   CATARACT EXTRACTION W/PHACO Right 07/05/2016   Procedure: CATARACT EXTRACTION PHACO AND INTRAOCULAR LENS PLACEMENT (IOC);  Surgeon: Cherene Mania, MD;  Location: [facility];  Service: Ophthalmology;  Laterality: Right;  CDE: 15.41   CATARACT EXTRACTION W/PHACO Left 07/19/2016   Procedure: CATARACT EXTRACTION PHACO AND INTRAOCULAR LENS PLACEMENT LEFT EYE CDE= 12.65;  Surgeon: Cherene Mania, MD;  Location: [facility];  Service: Ophthalmology;  Laterality: Left;  left   COLONOSCOPY WITH PROPOFOL  N/A 09/09/2021   Procedure: COLONOSCOPY WITH PROPOFOL ;  Surgeon: Shaaron Lamar HERO, MD;  Location: [facility];  Service: Endoscopy;  Laterality: N/A;  10:30am   EYE SURGERY     FINE NEEDLE ASPIRATION  05/13/2022   Procedure: FINE NEEDLE ASPIRATION (FNA) LINEAR;  Surgeon: Brenna Adine CROME, DO;  Location: [facility];  Service: Pulmonary;;   INTERCOSTAL NERVE BLOCK  06/14/2022   Procedure: INTERCOSTAL NERVE BLOCK;  Surgeon: Kerrin Elspeth BROCKS, MD;  Location[facility] OR;  Service: Thoracic;;   IR IMAGING GUIDED PORT INSERTION  07/21/2022   LUMBAR LAMINECTOMY/DECOMPRESSION MICRODISCECTOMY Left 09/27/2018   Procedure: L4-L5 Lumbar Laminectomy/Decompression;  Surgeon: Burnetta Aures, MD;  Location: [facility] OR;   Service: Orthopedics;  Laterality: Left;   NODE DISSECTION Right 06/14/2022   Procedure: NODE DISSECTION;  Surgeon: Kerrin Elspeth BROCKS, MD;  Location: [facility] OR;  Service: Thoracic;  Laterality: Right;   POLYPECTOMY  09/09/2021   Procedure: POLYPECTOMY;  Surgeon: Shaaron Lamar HERO, MD;  [facility];  Service: Endoscopy;;   ROBOTIC PELVIC AND PARA-AORTIC LYMPH NODE DISSECTION N/A 01/11/2017   Procedure: XI ROBOTIC BILATERAL PELVIC LYMPH NODE DISSECTION;  Surgeon: Eloy Herring, MD;  Location: [facility] ORS;  Service: Gynecology;  Laterality: N/A;   SALPINGOOPHORECTOMY Right 11/09/2016   Procedure: RIGHT SALPINGO OOPHORECTOMY;  Surgeon: Edsel Norleen GAILS, MD;  Location: [facility];  Service: Gynecology;  Laterality: Right;   TUBAL LIGATION     VAGINAL HYSTERECTOMY N/A 11/09/2016  Procedure: HYSTERECTOMY VAGINAL;  Surgeon: Edsel Norleen GAILS, MD;  Location: [facility];  Service: Gynecology;  Laterality: N/A;   VIDEO BRONCHOSCOPY WITH ENDOBRONCHIAL ULTRASOUND Right 05/13/2022   Procedure: VIDEO BRONCHOSCOPY WITH ENDOBRONCHIAL ULTRASOUND;  Surgeon: Brenna Adine CROME, DO;  Location: [facility];  Service: Pulmonary;  Laterality: Right;   Family History  Problem Relation Age of Onset   Hypertension Mother    Congenital heart disease Mother    Diabetes Mother    Thyroid  disease Brother    Other Daughter        bowel issues   Colon cancer Neg Hx    Colon polyps Neg Hx    Social History   Socioeconomic History   Marital status: Legally Separated    Spouse name: Not on file   Number of children: 1   Years of education: Not on file   Highest education level: Not on file  Occupational History   Not on file  Tobacco Use   Smoking status: Every Day    Current packs/day: 1.00    Average packs/day: 1 pack/day for 44.0 years (44.0 ttl pk-yrs)    Types: Cigarettes   Smokeless tobacco: Never   Tobacco comments:    Smokes 1/2 ppd per pt. 08/10/22  Vaping Use   Vaping status: Never Used  Substance and  Sexual Activity   Alcohol use: No    Comment: 01-06-2016 Per pt rarely, 02-05-2016 per pt no but 47yrs ago     Drug use: No    Comment: 02-05-2016 per pt no but about 40 yrs ago   Sexual activity: Not Currently    Birth control/protection: Surgical    Comment: hyst  Other Topics Concern   Not on file  Social History Narrative   Not on file   Social Drivers of Health   Financial Resource Strain: Unknown (01/20/2021)   Overall Financial Resource Strain (CARDIA)    Difficulty of Paying Living Expenses: Patient declined  Food Insecurity: No Food Insecurity (11/10/2023)   Hunger Vital Sign    Worried About Running Out of Food in the Last Year: Never true    Ran Out of Food in the Last Year: Never true  Transportation Needs: No Transportation Needs (11/10/2023)   PRAPARE - Administrator, Civil Service (Medical): No    Lack of Transportation (Non-Medical): No  Physical Activity: Inactive (01/20/2021)   Exercise Vital Sign    Days of Exercise per Week: 1 day    Minutes of Exercise per Session: 0 min  Stress: Stress Concern Present (01/20/2021)   [employer] of Occupational Health - Occupational Stress Questionnaire    Feeling of Stress : Very much  Social Connections: Moderately Isolated (11/10/2023)   Social Connection and Isolation Panel    Frequency of Communication with Friends and Family: Twice a week    Frequency of Social Gatherings with Friends and Family: Never    Attends Religious Services: More than 4 times per year    Active Member of [employer] or Organizations: No    Attends Banker Meetings: Never    Marital Status: Married  Catering manager Violence: Not At Risk (11/10/2023)   Humiliation, Afraid, Rape, and Kick questionnaire    Fear of Current or Ex-Partner: No    Emotionally Abused: No    Physically Abused: No    Sexually Abused: No       Objective:  There were no vitals taken for this visit. {Pulm Vitals (Optional):32837}  Physical  Exam  Diagnostic Review:  {Labs (Optional):32838}  CT Chest 11/2023: IMPRESSION: 1. Increased, now complete atelectasis and consolidation of the left lower lobe with fibrotic air bronchograms. 2. No significant change in clustered centrilobular and tree-in-bud nodularity throughout the lungs, particularly conspicuous in the dependent right upper lobe and dependent left upper lobe. Findings are consistent with atypical infection or aspiration. 3. Status post right lower lobectomy. 4. Unchanged small, loculated right pleural effusion with associated pleural thickening. 5. Emphysema.  PET/CT 12/2023:  IMPRESSION: 1. Marked improvement in LEFT lobe atelectasis. No evidence of LEFT lobe malignancy. 2. Small peripheral nodule in the LEFT upper lobe favored benign. 3. RIGHT hilar and RIGHT paratracheal mild adenopathy is favored reactive. 4. Post RIGHT lobectomy with small effusion. No evidence of local recurrence. 5. No evidence of metastatic disease outside of the thorax. 6.  Post hysterectomy.  Adnexa unremarkable  PFT 2024: FEV1 and FVC reduced with normal ratio, RV elevated suggestive of air trapping.     Assessment & Plan:   Assessment & Plan   No orders of the defined types were placed in this encounter.     No follow-ups on file.   Derionna Salvador, MD

## 2024-01-10 ENCOUNTER — Ambulatory Visit: Admitting: Pulmonary Disease

## 2024-01-10 DIAGNOSIS — R0600 Dyspnea, unspecified: Secondary | ICD-10-CM

## 2024-01-10 DIAGNOSIS — R079 Chest pain, unspecified: Secondary | ICD-10-CM

## 2024-01-10 DIAGNOSIS — R911 Solitary pulmonary nodule: Secondary | ICD-10-CM

## 2024-01-12 ENCOUNTER — Encounter: Payer: Self-pay | Admitting: General Surgery

## 2024-01-12 ENCOUNTER — Ambulatory Visit: Admitting: General Surgery

## 2024-01-12 VITALS — BP 111/74 | HR 82 | Temp 97.6°F | Resp 16 | Ht 66.0 in | Wt 119.0 lb

## 2024-01-12 DIAGNOSIS — Z95828 Presence of other vascular implants and grafts: Secondary | ICD-10-CM

## 2024-01-12 NOTE — Progress Notes (Signed)
 [facility] History and Physical  Reason for Referral: [employer] charm  Referring Physician: Oncology  Chief Complaint   New Patient (Initial Visit)     [name] is a 68 y.o. female.  HPI:   Discussed the use of AI scribe software for clinical note transcription with the patient, who gave verbal consent to proceed.  History of Present Illness [name] is a 68 year old female who presents for removal of her port.  The patient has completed treatment and Oncology sent her for port removal. She had a port placed by IR in her right jugular vein approximately 18 months ago. She experiences occasional pain at the site, which she attributes to her thin skin, noting that the port 'sinks in somewhere.' Discomfort is also present at the site, and the patient notes that the port and tubing are visible under her skin.  She is not currently on any blood thinners.   Past Medical History:  Diagnosis Date   Anxiety    Arthritis    spinal stenosis   Back disorder    crooked spine   Bulging lumbar disc    Cervical cancer (HCC)    Complication of anesthesia    ? hypotension 10/2016 at [facility] surgery    Depression    Dyspnea    H/O degenerative disc disease    History of radiation therapy 02/22/17-03/22/17   vaginal cuff 30 Gy in 5 fractions   Hypertension    Hypothyroidism    patient taken off of hypothyroid med in 11/2016    Osteoporosis    Thyroid  disease     Past Surgical History:  Procedure Laterality Date   ABDOMINAL EXPOSURE N/A 09/27/2018   Procedure: ABDOMINAL EXPOSURE;  Surgeon: Serene Gaile ORN, MD;  [facility];  Service: Vascular;  Laterality: N/A;   ABDOMINAL HYSTERECTOMY     ANTERIOR AND POSTERIOR REPAIR N/A 11/09/2016   Procedure: ANTERIOR (CYSTOCELE) AND POSTERIOR REPAIR (RECTOCELE);  Surgeon: Edsel Norleen GAILS, MD;  Location: [facility]S;  Service: Gynecology;  Laterality: N/A;   ANTERIOR LUMBAR FUSION Left 09/27/2018   Procedure:  ANTERIOR LUMBAR FUSION L5-S1,;  Surgeon: Burnetta Aures, MD;  [facility]: [facility] OR;  Service: Orthopedics;  Laterality: Left;  4.5 HRS/ DR. SERENE ASSIST   BACK SURGERY     BILATERAL SALPINGECTOMY Left 11/09/2016   Procedure: LEFT SALPINGECTOMY;  Surgeon: Edsel Norleen GAILS, MD;  Location: [facility];  Service: Gynecology;  Laterality: Left;   BRONCHIAL BIOPSY  05/13/2022   Procedure: BRONCHIAL BIOPSIES;  Surgeon: Brenna Adine CROME, DO;  [facility];  Service: Pulmonary;;   BRONCHIAL BRUSHINGS  05/13/2022   Procedure: BRONCHIAL BRUSHINGS;  Surgeon: Brenna Adine CROME, DO;  [facility];  Service: Pulmonary;;   BRONCHIAL NEEDLE ASPIRATION BIOPSY  05/13/2022   Procedure: BRONCHIAL NEEDLE ASPIRATION BIOPSIES;  Surgeon: Brenna Adine CROME, DO;  [facility];  Service: Pulmonary;;   CATARACT EXTRACTION W/PHACO Right 07/05/2016   Procedure: CATARACT EXTRACTION PHACO AND INTRAOCULAR LENS PLACEMENT (IOC);  Surgeon: Cherene Mania, MD;  Location: [facility];  Service: Ophthalmology;  Laterality: Right;  CDE: 15.41   CATARACT EXTRACTION W/PHACO Left 07/19/2016   Procedure: CATARACT EXTRACTION PHACO AND INTRAOCULAR LENS PLACEMENT LEFT EYE CDE= 12.65;  Surgeon: Cherene Mania, MD;  Location: [facility];  Service: Ophthalmology;  Laterality: Left;  left   COLONOSCOPY WITH PROPOFOL  N/A 09/09/2021   Procedure: COLONOSCOPY WITH PROPOFOL ;  Surgeon: Shaaron Lamar HERO, MD;  [facility];  Service: Endoscopy;  Laterality: N/A;  10:30am   EYE SURGERY     FINE NEEDLE ASPIRATION  05/13/2022   Procedure: FINE NEEDLE ASPIRATION (FNA) LINEAR;  Surgeon: Brenna Adine CROME, DO;  [facility];  Service: Pulmonary;;   INTERCOSTAL NERVE BLOCK  06/14/2022   Procedure: INTERCOSTAL NERVE BLOCK;  Surgeon: Kerrin Elspeth BROCKS, MD;  Location: [facility] OR;  Service: Thoracic;;   IR IMAGING GUIDED PORT INSERTION  07/21/2022   LUMBAR LAMINECTOMY/DECOMPRESSION MICRODISCECTOMY Left 09/27/2018   Procedure: L4-L5 Lumbar  Laminectomy/Decompression;  Surgeon: Burnetta Aures, MD;  [facility];  Service: Orthopedics;  Laterality: Left;   NODE DISSECTION Right 06/14/2022   Procedure: NODE DISSECTION;  Surgeon: Kerrin Elspeth BROCKS, MD;  Location: [facility] OR;  Service: Thoracic;  Laterality: Right;   POLYPECTOMY  09/09/2021   Procedure: POLYPECTOMY;  Surgeon: Shaaron Lamar HERO, MD;  [facility];  Service: Endoscopy;;   ROBOTIC PELVIC AND PARA-AORTIC LYMPH NODE DISSECTION N/A 01/11/2017   Procedure: XI ROBOTIC BILATERAL PELVIC LYMPH NODE DISSECTION;  Surgeon: Eloy Herring, MD;  Location: [facility];  Service: Gynecology;  Laterality: N/A;   SALPINGOOPHORECTOMY Right 11/09/2016   Procedure: RIGHT SALPINGO OOPHORECTOMY;  Surgeon: Edsel Norleen GAILS, MD;  [facility]: [facility];  Service: Gynecology;  Laterality: Right;   TUBAL LIGATION     VAGINAL HYSTERECTOMY N/A 11/09/2016   Procedure: HYSTERECTOMY VAGINAL;  Surgeon: Edsel Norleen GAILS, MD;  [facility]: [facility];  Service: Gynecology;  Laterality: N/A;   VIDEO BRONCHOSCOPY WITH ENDOBRONCHIAL ULTRASOUND Right 05/13/2022   Procedure: VIDEO BRONCHOSCOPY WITH ENDOBRONCHIAL ULTRASOUND;  Surgeon: Brenna Adine CROME, DO;  [facility];  Service: Pulmonary;  Laterality: Right;    Family History  Problem Relation Age of Onset   Hypertension Mother    Congenital heart disease Mother    Diabetes Mother    Thyroid  disease Brother    Other Daughter        bowel issues   Colon cancer Neg Hx    Colon polyps Neg Hx     Social History   Tobacco Use   Smoking status: Every Day    Current packs/day: 1.00    Average packs/day: 1 pack/day for 44.0 years (44.0 ttl pk-yrs)    Types: Cigarettes   Smokeless tobacco: Never   Tobacco comments:    Smokes 1/2 ppd per pt. 08/10/22  Vaping Use   Vaping status: Never Used  Substance Use Topics   Alcohol use: No    Comment: 01-06-2016 Per pt rarely, 02-05-2016 per pt no but 68yrs ago     Drug use: No    Comment: 02-05-2016 per pt no  but about 40 yrs ago    Medications: I have reviewed the patient's current medications. Allergies as of 01/12/2024       Reactions   Zithromax [azithromycin] Rash, Other (See Comments)   Blisters in mouth    Prednisone  Nausea Only   Sick to stomach Can take in low dose        Medication List        Accurate as of January 12, 2024  2:03 PM. If you have any questions, ask your nurse or doctor.          STOP taking these medications    benzonatate  100 MG capsule Commonly known as: TESSALON  Stopped by: Manuelita BROCKS Pander   lidocaine -prilocaine  cream Commonly known as: EMLA  Stopped by: Manuelita BROCKS Pander       TAKE these medications    acetaminophen  325 MG tablet Commonly known as:  TYLENOL  Take 650 mg by mouth every 6 (six) hours as needed for headache.   albuterol  108 (90 Base) MCG/ACT inhaler Commonly known as: VENTOLIN  HFA INHALE 2 PUFFS INTO THE LUNGS EVERY 6 HOURS AS NEEDED FOR WHEEZING OR SHORTNESS OF BREATH.   alendronate 70 MG tablet Commonly known as: FOSAMAX Take 70 mg by mouth once a week.   ALPRAZolam  1 MG tablet Commonly known as: XANAX  Take 1 mg by mouth at bedtime. May take additional 1 mg during the day if needed   atorvastatin  40 MG tablet Commonly known as: LIPITOR Take 40 mg by mouth at bedtime.   FLUoxetine  20 MG capsule Commonly known as: PROZAC  Take 60 mg by mouth every morning.   Fluoxetine  HCl (PMDD) 20 MG Tabs Take 3 tablets every day by oral route.   ipratropium-albuterol  0.5-2.5 (3) MG/3ML Soln Commonly known as: DUONEB Take 3 mLs by nebulization every 6 (six) hours as needed (Shortness of breath, cough and/or wheeze).   lisinopril  10 MG tablet Commonly known as: ZESTRIL  Take 10 mg by mouth daily.   traZODone  100 MG tablet Commonly known as: DESYREL  Take 100 mg by mouth at bedtime.   Trelegy Ellipta  100-62.5-25 MCG/ACT Aepb Generic drug: Fluticasone -Umeclidin-Vilant Inhale 1 each into the lungs daily.          ROS:  A comprehensive review of systems was negative except for: Respiratory: positive for SOB Cardiovascular: positive for HTN  Blood pressure 111/74, pulse 82, temperature 97.6 F (36.4 C), temperature source Oral, resp. rate 16, height 5' 6 (1.676 m), weight 119 lb (54 kg), SpO2 90%.  Physical Exam GENERAL: Alert, cooperative, well developed, no acute distress HEENT: Normocephalic, normal oropharynx, moist mucous membranes NECK: Port in right jugular vein, skin thin over port area to the chest CHEST: Clear to auscultation bilaterally, no wheezes, rhonchi, or crackles CARDIOVASCULAR: Normal heart rate and rhythm ABDOMEN: Soft, non-tender, non-distended, without organomegaly, normal bowel sounds EXTREMITIES: No cyanosis or edema NEUROLOGICAL: Cranial nerves grossly intact, moves all extremities without gross motor or sensory deficit  Results: None   Assessment & Plan Indwelling right internal jugular venous port removal Port in place for 18 months, scheduled for removal. Explained risks of bleeding, infection, skin irritation, and incomplete removal. - Schedule port removal at the procedure room - Administer local anesthesia. - Perform post-procedure x-ray to confirm complete removal.    All questions were answered to the satisfaction of the patient.   Manuelita JAYSON Pander 01/12/2024, 2:03 PM

## 2024-01-13 DIAGNOSIS — Z95828 Presence of other vascular implants and grafts: Secondary | ICD-10-CM

## 2024-01-13 NOTE — H&P (Signed)
 [facility] History and Physical  Reason for Referral: [employer] charm  Referring Physician: Oncology  Chief Complaint   New Patient (Initial Visit)     [name] is a 68 y.o. female.  HPI:   Discussed the use of AI scribe software for clinical note transcription with the patient, who gave verbal consent to proceed.  History of Present Illness [name] is a 68 year old female who presents for removal of her port.  The patient has completed treatment and Oncology sent her for port removal. She had a port placed by IR in her right jugular vein approximately 18 months ago. She experiences occasional pain at the site, which she attributes to her thin skin, noting that the port 'sinks in somewhere.' Discomfort is also present at the site, and the patient notes that the port and tubing are visible under her skin.  She is not currently on any blood thinners.   Past Medical History:  Diagnosis Date   Anxiety    Arthritis    spinal stenosis   Back disorder    crooked spine   Bulging lumbar disc    Cervical cancer (HCC)    Complication of anesthesia    ? hypotension 10/2016 at [facility] surgery    Depression    Dyspnea    H/O degenerative disc disease    History of radiation therapy 02/22/17-03/22/17   vaginal cuff 30 Gy in 5 fractions   Hypertension    Hypothyroidism    patient taken off of hypothyroid med in 11/2016    Osteoporosis    Thyroid  disease     Past Surgical History:  Procedure Laterality Date   ABDOMINAL EXPOSURE N/A 09/27/2018   Procedure: ABDOMINAL EXPOSURE;  Surgeon: Serene Gaile ORN, MD;  [facility];  Service: Vascular;  Laterality: N/A;   ABDOMINAL HYSTERECTOMY     ANTERIOR AND POSTERIOR REPAIR N/A 11/09/2016   Procedure: ANTERIOR (CYSTOCELE) AND POSTERIOR REPAIR (RECTOCELE);  Surgeon: Edsel Norleen GAILS, MD;  [facility]: [facility];  Service: Gynecology;  Laterality: N/A;   ANTERIOR LUMBAR FUSION Left 09/27/2018   Procedure:  ANTERIOR LUMBAR FUSION L5-S1,;  Surgeon: Burnetta Aures, MD;  [facility];  Service: Orthopedics;  Laterality: Left;  4.5 HRS/ DR. SERENE ASSIST   BACK SURGERY     BILATERAL SALPINGECTOMY Left 11/09/2016   Procedure: LEFT SALPINGECTOMY;  Surgeon: Edsel Norleen GAILS, MD;  [facility]: [facility];  Service: Gynecology;  Laterality: Left;   BRONCHIAL BIOPSY  05/13/2022   Procedure: BRONCHIAL BIOPSIES;  Surgeon: Brenna Adine CROME, DO;  [facility];  Service: Pulmonary;;   BRONCHIAL BRUSHINGS  05/13/2022   Procedure: BRONCHIAL BRUSHINGS;  Surgeon: Brenna Adine CROME, DO;  [facility];  Service: Pulmonary;;   BRONCHIAL NEEDLE ASPIRATION BIOPSY  05/13/2022   Procedure: BRONCHIAL NEEDLE ASPIRATION BIOPSIES;  Surgeon: Brenna Adine CROME, DO;  [facility];  Service: Pulmonary;;   CATARACT EXTRACTION W/PHACO Right 07/05/2016   Procedure: CATARACT EXTRACTION PHACO AND INTRAOCULAR LENS PLACEMENT (IOC);  Surgeon: Cherene Mania, MD;  [facility]: [facility];  Service: Ophthalmology;  Laterality: Right;  CDE: 15.41   CATARACT EXTRACTION W/PHACO Left 07/19/2016   Procedure: CATARACT EXTRACTION PHACO AND INTRAOCULAR LENS PLACEMENT LEFT EYE CDE= 12.65;  Surgeon: Cherene Mania, MD;  Location: [facility];  Service: Ophthalmology;  Laterality: Left;  left   COLONOSCOPY WITH PROPOFOL  N/A 09/09/2021   Procedure: COLONOSCOPY WITH PROPOFOL ;  Surgeon: Shaaron Lamar HERO, MD;  [facility];  Service: Endoscopy;  Laterality: N/A;  10:30am   EYE SURGERY     FINE NEEDLE ASPIRATION  05/13/2022   Procedure: FINE NEEDLE ASPIRATION (FNA) LINEAR;  Surgeon: Brenna Adine CROME, DO;  [facility];  Service: Pulmonary;;   INTERCOSTAL NERVE BLOCK  06/14/2022   Procedure: INTERCOSTAL NERVE BLOCK;  Surgeon: Kerrin Elspeth BROCKS, MD;  Location: [facility] OR;  Service: Thoracic;;   IR IMAGING GUIDED PORT INSERTION  07/21/2022   LUMBAR LAMINECTOMY/DECOMPRESSION MICRODISCECTOMY Left 09/27/2018   Procedure: L4-L5 Lumbar  Laminectomy/Decompression;  Surgeon: Burnetta Aures, MD;  [facility];  Service: Orthopedics;  Laterality: Left;   NODE DISSECTION Right 06/14/2022   Procedure: NODE DISSECTION;  Surgeon: Kerrin Elspeth BROCKS, MD;  Location: [facility] OR;  Service: Thoracic;  Laterality: Right;   POLYPECTOMY  09/09/2021   Procedure: POLYPECTOMY;  Surgeon: Shaaron Lamar HERO, MD;  [facility];  Service: Endoscopy;;   ROBOTIC PELVIC AND PARA-AORTIC LYMPH NODE DISSECTION N/A 01/11/2017   Procedure: XI ROBOTIC BILATERAL PELVIC LYMPH NODE DISSECTION;  Surgeon: Eloy Herring, MD;  Location: [facility];  Service: Gynecology;  Laterality: N/A;   SALPINGOOPHORECTOMY Right 11/09/2016   Procedure: RIGHT SALPINGO OOPHORECTOMY;  Surgeon: Edsel Norleen GAILS, MD;  Location: [facility];  Service: Gynecology;  Laterality: Right;   TUBAL LIGATION     VAGINAL HYSTERECTOMY N/A 11/09/2016   Procedure: HYSTERECTOMY VAGINAL;  Surgeon: Edsel Norleen GAILS, MD;  Location: [facility];  Service: Gynecology;  Laterality: N/A;   VIDEO BRONCHOSCOPY WITH ENDOBRONCHIAL ULTRASOUND Right 05/13/2022   Procedure: VIDEO BRONCHOSCOPY WITH ENDOBRONCHIAL ULTRASOUND;  Surgeon: Brenna Adine CROME, DO;  [facility];  Service: Pulmonary;  Laterality: Right;    Family History  Problem Relation Age of Onset   Hypertension Mother    Congenital heart disease Mother    Diabetes Mother    Thyroid  disease Brother    Other Daughter        bowel issues   Colon cancer Neg Hx    Colon polyps Neg Hx     Social History   Tobacco Use   Smoking status: Every Day    Current packs/day: 1.00    Average packs/day: 1 pack/day for 44.0 years (44.0 ttl pk-yrs)    Types: Cigarettes   Smokeless tobacco: Never   Tobacco comments:    Smokes 1/2 ppd per pt. 08/10/22  Vaping Use   Vaping status: Never Used  Substance Use Topics   Alcohol use: No    Comment: 01-06-2016 Per pt rarely, 02-05-2016 per pt no but 68yrs ago     Drug use: No    Comment: 02-05-2016 per pt no  but about 40 yrs ago    Medications: I have reviewed the patient's current medications. Allergies as of 01/12/2024       Reactions   Zithromax [azithromycin] Rash, Other (See Comments)   Blisters in mouth    Prednisone  Nausea Only   Sick to stomach Can take in low dose        Medication List        Accurate as of January 12, 2024  2:03 PM. If you have any questions, ask your nurse or doctor.          STOP taking these medications    benzonatate  100 MG capsule Commonly known as: TESSALON  Stopped by: Manuelita BROCKS Pander   lidocaine -prilocaine  cream Commonly known as: EMLA  Stopped by: Manuelita BROCKS Pander       TAKE these medications    acetaminophen  325 MG tablet Commonly known as:  TYLENOL  Take 650 mg by mouth every 6 (six) hours as needed for headache.   albuterol  108 (90 Base) MCG/ACT inhaler Commonly known as: VENTOLIN  HFA INHALE 2 PUFFS INTO THE LUNGS EVERY 6 HOURS AS NEEDED FOR WHEEZING OR SHORTNESS OF BREATH.   alendronate 70 MG tablet Commonly known as: FOSAMAX Take 70 mg by mouth once a week.   ALPRAZolam  1 MG tablet Commonly known as: XANAX  Take 1 mg by mouth at bedtime. May take additional 1 mg during the day if needed   atorvastatin  40 MG tablet Commonly known as: LIPITOR Take 40 mg by mouth at bedtime.   FLUoxetine  20 MG capsule Commonly known as: PROZAC  Take 60 mg by mouth every morning.   Fluoxetine  HCl (PMDD) 20 MG Tabs Take 3 tablets every day by oral route.   ipratropium-albuterol  0.5-2.5 (3) MG/3ML Soln Commonly known as: DUONEB Take 3 mLs by nebulization every 6 (six) hours as needed (Shortness of breath, cough and/or wheeze).   lisinopril  10 MG tablet Commonly known as: ZESTRIL  Take 10 mg by mouth daily.   traZODone  100 MG tablet Commonly known as: DESYREL  Take 100 mg by mouth at bedtime.   Trelegy Ellipta  100-62.5-25 MCG/ACT Aepb Generic drug: Fluticasone -Umeclidin-Vilant Inhale 1 each into the lungs daily.          ROS:  A comprehensive review of systems was negative except for: Respiratory: positive for SOB Cardiovascular: positive for HTN  Blood pressure 111/74, pulse 82, temperature 97.6 F (36.4 C), temperature source Oral, resp. rate 16, height 5' 6 (1.676 m), weight 119 lb (54 kg), SpO2 90%.  Physical Exam GENERAL: Alert, cooperative, well developed, no acute distress HEENT: Normocephalic, normal oropharynx, moist mucous membranes NECK: Port in right jugular vein, skin thin over port area to the chest CHEST: Clear to auscultation bilaterally, no wheezes, rhonchi, or crackles CARDIOVASCULAR: Normal heart rate and rhythm ABDOMEN: Soft, non-tender, non-distended, without organomegaly, normal bowel sounds EXTREMITIES: No cyanosis or edema NEUROLOGICAL: Cranial nerves grossly intact, moves all extremities without gross motor or sensory deficit  Results: None   Assessment & Plan Indwelling right internal jugular venous port removal Port in place for 18 months, scheduled for removal. Explained risks of bleeding, infection, skin irritation, and incomplete removal. - Schedule port removal at the procedure room - Administer local anesthesia. - Perform post-procedure x-ray to confirm complete removal.    All questions were answered to the satisfaction of the patient.   Manuelita JAYSON Pander 01/12/2024, 2:03 PM

## 2024-01-19 ENCOUNTER — Ambulatory Visit (HOSPITAL_COMMUNITY)

## 2024-01-19 ENCOUNTER — Encounter (HOSPITAL_COMMUNITY): Payer: Self-pay | Admitting: General Surgery

## 2024-01-19 ENCOUNTER — Ambulatory Visit: Payer: Self-pay | Admitting: General Surgery

## 2024-01-19 ENCOUNTER — Ambulatory Visit (HOSPITAL_COMMUNITY)
Admission: RE | Admit: 2024-01-19 | Discharge: 2024-01-19 | Disposition: A | Discharge status: Home / Self Care | Attending: General Surgery | Admitting: General Surgery

## 2024-01-19 ENCOUNTER — Encounter (HOSPITAL_COMMUNITY): Admission: RE | Disposition: A | Payer: Self-pay | Source: Home / Self Care | Attending: General Surgery

## 2024-01-19 DIAGNOSIS — I10 Essential (primary) hypertension: Secondary | ICD-10-CM | POA: Diagnosis not present

## 2024-01-19 DIAGNOSIS — Z95828 Presence of other vascular implants and grafts: Secondary | ICD-10-CM | POA: Diagnosis not present

## 2024-01-19 DIAGNOSIS — Z452 Encounter for adjustment and management of vascular access device: Secondary | ICD-10-CM | POA: Insufficient documentation

## 2024-01-19 DIAGNOSIS — F17210 Nicotine dependence, cigarettes, uncomplicated: Secondary | ICD-10-CM | POA: Diagnosis not present

## 2024-01-19 HISTORY — PX: PORT-A-CATH REMOVAL: SHX5289

## 2024-01-19 SURGERY — REMOVAL PORT-A-CATH
Anesthesia: LOCAL

## 2024-01-19 MED ORDER — LIDOCAINE HCL (PF) 1 % IJ SOLN
INTRAMUSCULAR | Status: AC
  Filled 2024-01-19: qty 30

## 2024-01-19 MED ORDER — CHLORHEXIDINE GLUCONATE CLOTH 2 % EX PADS: 6.0000 | MEDICATED_PAD | Freq: Once | CUTANEOUS | Status: DC

## 2024-01-19 MED ORDER — LIDOCAINE HCL (PF) 1 % IJ SOLN
INTRAMUSCULAR | Status: DC | PRN
Start: 2024-01-19 — End: 2024-01-19
  Administered 2024-01-19: 10 mL

## 2024-01-19 SURGICAL SUPPLY — 21 items
APPLICATOR CHLORAPREP 10.5 ORG (MISCELLANEOUS) ×1 IMPLANT
CLOTH BEACON ORANGE TIMEOUT ST (SAFETY) ×1 IMPLANT
COVER LIGHT HANDLE (MISCELLANEOUS) IMPLANT
COVER SURGICAL LIGHT HANDLE (MISCELLANEOUS) ×2 IMPLANT
DECANTER SPIKE VIAL GLASS SM (MISCELLANEOUS) ×1 IMPLANT
DERMABOND ADVANCED .7 DNX12 (GAUZE/BANDAGES/DRESSINGS) ×1 IMPLANT
ELECTRODE REM PT RTRN 9FT ADLT (ELECTROSURGICAL) ×1 IMPLANT
GLOVE BIO SURGEON STRL SZ 6.5 (GLOVE) ×1 IMPLANT
GLOVE BIOGEL PI IND STRL 6.5 (GLOVE) ×1 IMPLANT
GLOVE BIOGEL PI IND STRL 7.0 (GLOVE) ×2 IMPLANT
GOWN STRL REUS W/TWL LRG LVL3 (GOWN DISPOSABLE) ×2 IMPLANT
KIT TURNOVER KIT A (KITS) ×1 IMPLANT
NDL HYPO 21X1.5 SAFETY (NEEDLE) ×1 IMPLANT
NEEDLE HYPO 21X1.5 SAFETY (NEEDLE) ×1 IMPLANT
PACK MINOR (CUSTOM PROCEDURE TRAY) ×1 IMPLANT
PAD ARMBOARD POSITIONER FOAM (MISCELLANEOUS) ×1 IMPLANT
POSITIONER HEAD 8X9X4 ADT (SOFTGOODS) ×1 IMPLANT
SET BASIN LINEN APH (SET/KITS/TRAYS/PACK) ×1 IMPLANT
SUT MNCRL AB 4-0 PS2 18 (SUTURE) ×1 IMPLANT
SUT VIC AB 3-0 SH 27X BRD (SUTURE) ×1 IMPLANT
SYR 30ML LL (SYRINGE) ×1 IMPLANT

## 2024-01-19 NOTE — Progress Notes (Signed)
 [facility]  CXR with no ptx and port removed.  Manuelita Pander, MD [facility] [address] [place], [place] [address] [phone] (office)

## 2024-01-19 NOTE — Op Note (Signed)
 [facility] Procedure Note  01/19/24  Pre-procedure Diagnosis: Port a catheter in right internal jugular vein   Post-procedure Diagnosis: Same   Procedure(s) Performed: Removal of port a catheter    Surgeon: Manuelita BROCKS. Kallie, MD   Assistants: No qualified resident was available    Anesthesia: Lidocaine  1%    Specimens:  None    Estimated Blood Loss: Minimal  Wound Class: Clean    Procedure Indications: Ms. Whinery is a 68 yo who has a port in place and needs it removed. We discussed removal with local and risk of bleeding, infection, incomplete removal, ptx.   Findings: Thin skin over the port area   Procedure: The patient was taken to the procedure room and placed  semi upright. The right chest and neck were prepared and draped in the usual sterile fashion. Lidocaine  1% was injected over the prior incision for the port and around the port. An incision was made and sharp dissection was performed removing the port and the catheter in its entirety. Pressure was held by my assistant for 10 minutes at the jugular vein.    The incision and cavity were closed with 3-0 Vicryl suture and the skin was closed with 4-0 Monocryl and dermabond.   Final inspection revealed acceptable hemostasis. The patient tolerated the procedure well.   A CXR was ordered at the end of the case.   Manuelita Kallie, MD [facility] [address] [place], [place] [address] [phone] (office)

## 2024-01-19 NOTE — Interval H&P Note (Signed)
 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  The various methods of treatment have been discussed with the patient and family. After consideration of risks, benefits and other options for treatment, the patient has consented to  Procedure(s) with comments: REMOVAL PORT-A-CATH (N/A) - MINOR as a surgical intervention.  The patient's history has been reviewed, patient examined, no change in status, stable for surgery.  I have reviewed the patient's chart and labs.  Questions were answered to the patient's satisfaction.     [name]

## 2024-01-19 NOTE — Discharge Instructions (Addendum)
 Keep area clean and dry. You can take a shower in 24 hours. Do not submerge in water  for 4 weeks.  Take tylenol  and ibuprofen  for pain control.

## 2024-02-02 ENCOUNTER — Other Ambulatory Visit (HOSPITAL_COMMUNITY): Payer: Self-pay | Admitting: Adult Health

## 2024-02-02 DIAGNOSIS — Z1231 Encounter for screening mammogram for malignant neoplasm of breast: Secondary | ICD-10-CM

## 2024-02-10 ENCOUNTER — Encounter: Admitting: Pulmonary Disease

## 2024-02-20 ENCOUNTER — Encounter (HOSPITAL_COMMUNITY): Payer: Self-pay

## 2024-02-20 ENCOUNTER — Ambulatory Visit (HOSPITAL_COMMUNITY)
Admission: RE | Admit: 2024-02-20 | Discharge: 2024-02-20 | Disposition: A | Discharge status: Home / Self Care | Source: Ambulatory Visit | Attending: Adult Health | Admitting: Adult Health

## 2024-02-20 DIAGNOSIS — Z1231 Encounter for screening mammogram for malignant neoplasm of breast: Secondary | ICD-10-CM | POA: Diagnosis present

## 2024-02-27 ENCOUNTER — Other Ambulatory Visit (HOSPITAL_COMMUNITY)
Admission: RE | Admit: 2024-02-27 | Discharge: 2024-02-27 | Disposition: A | Discharge status: Home / Self Care | Source: Ambulatory Visit | Attending: Adult Health | Admitting: Adult Health

## 2024-02-27 ENCOUNTER — Ambulatory Visit (INDEPENDENT_AMBULATORY_CARE_PROVIDER_SITE_OTHER): Admitting: Adult Health

## 2024-02-27 ENCOUNTER — Encounter: Payer: Self-pay | Admitting: Adult Health

## 2024-02-27 VITALS — BP 165/100 | HR 78 | Ht 66.0 in | Wt 124.0 lb

## 2024-02-27 DIAGNOSIS — N823 Fistula of vagina to large intestine: Secondary | ICD-10-CM

## 2024-02-27 DIAGNOSIS — Z01419 Encounter for gynecological examination (general) (routine) without abnormal findings: Secondary | ICD-10-CM | POA: Insufficient documentation

## 2024-02-27 DIAGNOSIS — K649 Unspecified hemorrhoids: Secondary | ICD-10-CM

## 2024-02-27 DIAGNOSIS — Z90710 Acquired absence of both cervix and uterus: Secondary | ICD-10-CM

## 2024-02-27 DIAGNOSIS — R102 Pelvic and perineal pain unspecified side: Secondary | ICD-10-CM

## 2024-02-27 DIAGNOSIS — Z1151 Encounter for screening for human papillomavirus (HPV): Secondary | ICD-10-CM | POA: Insufficient documentation

## 2024-02-27 DIAGNOSIS — Z8541 Personal history of malignant neoplasm of cervix uteri: Secondary | ICD-10-CM

## 2024-02-27 DIAGNOSIS — Z08 Encounter for follow-up examination after completed treatment for malignant neoplasm: Secondary | ICD-10-CM | POA: Insufficient documentation

## 2024-02-27 DIAGNOSIS — I10 Essential (primary) hypertension: Secondary | ICD-10-CM

## 2024-02-27 DIAGNOSIS — N8110 Cystocele, unspecified: Secondary | ICD-10-CM

## 2024-02-27 NOTE — Progress Notes (Addendum)
  Subjective:     Patient ID: [name], female   DOB: [birth year], 68 y.o.   MRN: [id number]  HPI [name] is a 68 year old white female, married, sp hysterectomy had cervical cancer and 5 HD radiation treatments, in complaining of vaginal pain and has noticed ?stool from vagina. And she needs pap. She has lung cancer and had port removed recently.  Last pap 01/20/2021 NILM with negative HPV  Review of Systems +vaginal pain ?stool from vagina Has felt gas in vagina too Has know hemorrhoids   Reviewed past medical,surgical, social and family history. Reviewed medications and allergies.  Objective:   Physical Exam BP (!) 165/100 (BP Location: Left Arm, Patient Position: Sitting, Cuff Size: Normal)   Pulse 78   Ht 5' 6 (1.676 m)   Wt 124 lb (56.2 kg)   BMI 20.01 kg/m     Skin warm and dry.Pelvic: external genitalia is normal in appearance no lesions, vagina: has radiation changes and atrophy, +cystocele,urethra has no lesions or masses noted, cervix and uterus are absent, no lesions or fistula seen,adnexa: no masses or tenderness noted. Bladder is non tender and no masses felt. Has +hemorrhoid, did not perform rectal exam. Examination chaperoned by Clarita Salt LPN Fall risk is low  [facility] - 02/27/24 1058       Pregnancy Intention Screening   Does the patient want to become pregnant in the next year? N/A    Does the patient's partner want to become pregnant in the next year? N/A    Would the patient like to discuss contraceptive options today? N/A      Contraception Wrap Up   Current Method Hysterectomy    End Method Hysterectomy    Contraception Counseling Provided No          Assessment:     1. Vaginal pain (Primary) +pain in vagina No lesions seen has atrophy  Will recheck in 4 weeks   2. Encounter for vaginal Papanicolaou smear following hysterectomy for malignancy Pap sent Pap in 1 year - Cytology - PAP( [place])  3. S/P vaginal hysterectomy with  anterior, and posterior repair, right salpingo-oophorectomy and left salpingectomy  4. Cystocele, unspecified  5. Hemorrhoids, unspecified hemorrhoid type Has hemorrhoid to have banding this Thursday she she says   6. RVF (rectovaginal fistula), suspected, has wiped stool from vagina and has gas in vagina, hx 5 HD radaiton ion vagina Has radiation changes  Will recheck in about 4 weeks   7. History of cervical cancer Pap sent  Pap in 1 year  8. Primary hypertension Continue BP meds and follow up with PCP     Plan:     Follow up in about 4 weeks for recheck and ROS  If healed from hemorrhoid banding will get fistulagram

## 2024-02-28 ENCOUNTER — Ambulatory Visit: Payer: Self-pay | Admitting: Adult Health

## 2024-02-28 LAB — CYTOLOGY - PAP
Comment: NEGATIVE
Diagnosis: NEGATIVE
High risk HPV: NEGATIVE

## 2024-03-01 ENCOUNTER — Encounter: Payer: Self-pay | Admitting: Gastroenterology

## 2024-03-01 ENCOUNTER — Ambulatory Visit: Admitting: Gastroenterology

## 2024-03-01 VITALS — BP 159/90 | HR 72 | Temp 97.8°F | Ht 66.0 in | Wt 123.8 lb

## 2024-03-01 DIAGNOSIS — K642 Third degree hemorrhoids: Secondary | ICD-10-CM | POA: Diagnosis not present

## 2024-03-01 NOTE — Patient Instructions (Signed)
  Please avoid straining.  You should limit your toilet time to 2-3 minutes at the most.   I recommend Benefiber 2 teaspoons each morning in the beverage of your choice! You can take Miralax daily as needed, 1 capful.   Please call me with any concerns or issues!  I will see you in follow-up for additional banding in several weeks.    I enjoyed seeing you again today! I value our relationship and want to provide genuine, compassionate, and quality care. You may receive a survey regarding your visit with me, and I welcome your feedback! Thanks so much for taking the time to complete this. I look forward to seeing you again.      Therisa MICAEL Stager, PhD, ANP-BC [facility]

## 2024-03-01 NOTE — Progress Notes (Signed)
    [facility] BANDING PROCEDURE NOTE  [name] is a 68 y.o. female presenting today for consideration of hemorrhoid banding. Last colonoscopy May 2023 with one 4 mm polyp in sigmoid, non-bleeding internal hemorrhoids. History of Grade 3 symptomatic hemorrhoids. She has had banding of all columns over 2023/2024. Last banding Jan 2024. Prolapsing tissue is biggest concern.    The patient presents with symptomatic grade 3 hemorrhoids, unresponsive to maximal medical therapy, requesting rubber band ligation of his/her hemorrhoidal disease. All risks, benefits, and alternative forms of therapy were described and informed consent was obtained.   The decision was made to band the left lateral internal hemorrhoid, and the [facility] System was used to perform band ligation without complication. Digital anorectal examination was then performed to assure proper positioning of the band, and to adjust the banded tissue as required. The patient was discharged home without pain or other issues. Dietary and behavioral recommendations were given, along with follow-up instructions. The patient will return in several weeks for followup and possible additional banding as required.  No complications were encountered and the patient tolerated the procedure well.   Therisa MICAEL Stager, PhD, ANP-BC [facility]

## 2024-03-15 ENCOUNTER — Ambulatory Visit: Admitting: Gastroenterology

## 2024-03-26 ENCOUNTER — Ambulatory Visit: Admitting: Adult Health

## 2024-05-23 ENCOUNTER — Ambulatory Visit (HOSPITAL_COMMUNITY)

## 2024-05-23 ENCOUNTER — Inpatient Hospital Stay

## 2024-05-30 ENCOUNTER — Ambulatory Visit: Admitting: Oncology

## 2024-05-30 ENCOUNTER — Inpatient Hospital Stay: Admitting: Oncology
